# Patient Record
Sex: Male | Born: 1980
Health system: Southern US, Community
[De-identification: ages and names within clinical notes are randomized; demographics above are authoritative.]

## PROBLEM LIST (undated history)

## (undated) ENCOUNTER — Ambulatory Visit (HOSPITAL_COMMUNITY): Admission: EM | Payer: Self-pay

## (undated) DIAGNOSIS — F32A Depression, unspecified: Secondary | ICD-10-CM

## (undated) DIAGNOSIS — E785 Hyperlipidemia, unspecified: Secondary | ICD-10-CM

## (undated) DIAGNOSIS — N529 Male erectile dysfunction, unspecified: Secondary | ICD-10-CM

## (undated) DIAGNOSIS — Q681 Congenital deformity of finger(s) and hand: Secondary | ICD-10-CM

## (undated) DIAGNOSIS — F419 Anxiety disorder, unspecified: Secondary | ICD-10-CM

## (undated) DIAGNOSIS — G4700 Insomnia, unspecified: Secondary | ICD-10-CM

## (undated) DIAGNOSIS — F329 Major depressive disorder, single episode, unspecified: Secondary | ICD-10-CM

## (undated) DIAGNOSIS — Z0000 Encounter for general adult medical examination without abnormal findings: Secondary | ICD-10-CM

## (undated) DIAGNOSIS — R454 Irritability and anger: Secondary | ICD-10-CM

## (undated) DIAGNOSIS — F909 Attention-deficit hyperactivity disorder, unspecified type: Secondary | ICD-10-CM

## (undated) DIAGNOSIS — Z720 Tobacco use: Secondary | ICD-10-CM

## (undated) DIAGNOSIS — F988 Other specified behavioral and emotional disorders with onset usually occurring in childhood and adolescence: Secondary | ICD-10-CM

## (undated) DIAGNOSIS — F1011 Alcohol abuse, in remission: Secondary | ICD-10-CM

## (undated) DIAGNOSIS — J342 Deviated nasal septum: Secondary | ICD-10-CM

## (undated) HISTORY — DX: Anxiety disorder, unspecified: F41.9

## (undated) HISTORY — DX: Congenital deformity of finger(s) and hand: Q68.1

## (undated) HISTORY — PX: OTHER SURGICAL HISTORY: SHX169

## (undated) HISTORY — DX: Irritability and anger: R45.4

## (undated) HISTORY — DX: Male erectile dysfunction, unspecified: N52.9

## (undated) HISTORY — DX: Attention-deficit hyperactivity disorder, unspecified type: F90.9

## (undated) HISTORY — DX: Tobacco use: Z72.0

## (undated) HISTORY — DX: Alcohol abuse, in remission: F10.11

## (undated) HISTORY — DX: Deviated nasal septum: J34.2

## (undated) HISTORY — DX: Major depressive disorder, single episode, unspecified: F32.9

## (undated) HISTORY — PX: NASAL SEPTUM SURGERY: SHX37

## (undated) HISTORY — DX: Depression, unspecified: F32.A

## (undated) HISTORY — DX: Insomnia, unspecified: G47.00

## (undated) HISTORY — DX: Hyperlipidemia, unspecified: E78.5

## (undated) HISTORY — DX: Other specified behavioral and emotional disorders with onset usually occurring in childhood and adolescence: F98.8

## (undated) HISTORY — DX: Encounter for general adult medical examination without abnormal findings: Z00.00

---
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
## 2002-05-02 ENCOUNTER — Inpatient Hospital Stay (HOSPITAL_COMMUNITY): Admission: EM | Admit: 2002-05-02 | Discharge: 2002-05-06 | Payer: Self-pay

## 2002-05-02 ENCOUNTER — Encounter: Payer: Self-pay | Admitting: General Surgery

## 2002-05-02 ENCOUNTER — Encounter: Payer: Self-pay | Admitting: Emergency Medicine

## 2002-05-03 ENCOUNTER — Encounter: Payer: Self-pay | Admitting: General Surgery

## 2002-05-04 ENCOUNTER — Encounter: Payer: Self-pay | Admitting: General Surgery

## 2002-05-05 ENCOUNTER — Encounter: Payer: Self-pay | Admitting: General Surgery

## 2002-05-06 ENCOUNTER — Encounter: Payer: Self-pay | Admitting: General Surgery

## 2003-12-22 ENCOUNTER — Inpatient Hospital Stay (HOSPITAL_COMMUNITY): Admission: EM | Admit: 2003-12-22 | Discharge: 2003-12-23 | Payer: Self-pay | Admitting: Emergency Medicine

## 2003-12-24 ENCOUNTER — Emergency Department (HOSPITAL_COMMUNITY): Admission: EM | Admit: 2003-12-24 | Discharge: 2003-12-24 | Payer: Self-pay | Admitting: Emergency Medicine

## 2004-02-09 ENCOUNTER — Inpatient Hospital Stay (HOSPITAL_COMMUNITY): Admission: EM | Admit: 2004-02-09 | Discharge: 2004-02-11 | Payer: Self-pay | Admitting: Psychiatry

## 2004-07-20 ENCOUNTER — Ambulatory Visit (HOSPITAL_COMMUNITY): Payer: Self-pay | Admitting: Psychiatry

## 2005-11-02 ENCOUNTER — Emergency Department (HOSPITAL_COMMUNITY): Admission: EM | Admit: 2005-11-02 | Discharge: 2005-11-02 | Payer: Self-pay | Admitting: Emergency Medicine

## 2006-10-27 ENCOUNTER — Emergency Department (HOSPITAL_COMMUNITY): Admission: EM | Admit: 2006-10-27 | Discharge: 2006-10-27 | Payer: Self-pay | Admitting: Emergency Medicine

## 2008-12-25 ENCOUNTER — Emergency Department (HOSPITAL_COMMUNITY): Admission: EM | Admit: 2008-12-25 | Discharge: 2008-12-25 | Payer: Self-pay | Admitting: Emergency Medicine

## 2011-02-13 LAB — DIFFERENTIAL
Basophils Absolute: 0.1 10*3/uL (ref 0.0–0.1)
Basophils Relative: 1 % (ref 0–1)
Eosinophils Absolute: 0.2 10*3/uL (ref 0.0–0.7)
Eosinophils Relative: 3 % (ref 0–5)
Monocytes Absolute: 0.5 10*3/uL (ref 0.1–1.0)
Monocytes Relative: 6 % (ref 3–12)
Neutro Abs: 5.1 10*3/uL (ref 1.7–7.7)

## 2011-02-13 LAB — CBC
HCT: 47.3 % (ref 39.0–52.0)
Hemoglobin: 16.4 g/dL (ref 13.0–17.0)
MCHC: 34.7 g/dL (ref 30.0–36.0)
MCV: 88.9 fL (ref 78.0–100.0)
RBC: 5.32 MIL/uL (ref 4.22–5.81)
RDW: 13.8 % (ref 11.5–15.5)

## 2011-02-13 LAB — BASIC METABOLIC PANEL
CO2: 30 mEq/L (ref 19–32)
Calcium: 9.8 mg/dL (ref 8.4–10.5)
Chloride: 103 mEq/L (ref 96–112)
Glucose, Bld: 76 mg/dL (ref 70–99)
Sodium: 139 mEq/L (ref 135–145)

## 2011-02-13 LAB — RAPID URINE DRUG SCREEN, HOSP PERFORMED: Tetrahydrocannabinol: NOT DETECTED

## 2011-02-13 LAB — TRICYCLICS SCREEN, URINE: TCA Scrn: NOT DETECTED

## 2011-03-16 NOTE — H&P (Signed)
Jesse Jones, Jesse Jones NO.:  [id number]   MEDICAL RECORD NO.:  [id number]                   PATIENT TYPE:  EMS   LOCATION:  ED                                   FACILITY:  [facility]   PHYSICIAN:  Deirdre Peer. Polite, M.D.              DATE OF BIRTH:  Mar 12, 1981   DATE OF ADMISSION:  12/22/2003  DATE OF DISCHARGE:                                HISTORY & PHYSICAL   HISTORY OF PRESENT ILLNESS:  This 30 year old male with known history of  depression, anxiety, who presented to the ED via EMS with suicide gesture.  The patient was found at home obviously intoxicated by his mother.  When he  questioned, he stated that he wanted to end it all.  The patient stated he  was just fed up.   The patient stated he ingested Clonazepam, approximately eight tablets,  Bupropion 150 mg approximately three tablets, and about a half of a fifth of  alcohol.   In the emergency department, the patient was alert and oriented but  obviously intoxicated.  He has been treated with charcoal for poison control  and has been on the recommended ICU monitoring for the adverse effects of  the illicit drugs.   PAST MEDICAL HISTORY:  1. Significant for depression/anxiety.  2. ADHD.   MEDICATIONS:  1. Clonazepam 0.5 mg p.o. q.d.  2. Bupropion 150 mg 2 tablets q.d.  3. Adderall p.r.n.   SOCIAL HISTORY:  Smokes tobacco at half a pack per day.  Positive for  alcohol and positive for marijuana.  Occasional mushroom ingestion.   PAST SURGICAL HISTORY:  Significant for chest tube secondary to  pneumothorax.   ALLERGIES:  The patient describes an allergy to PENICILLIN.   FAMILY HISTORY:  Mother states that there is a family history of  bipolar/depression.   PHYSICAL EXAMINATION:  GENERAL:  The patient is alert and oriented x 3;  however, he is somewhat intoxicated.  VITAL SIGNS:  Temperature 97.6, blood pressure 108/67, pulse 132,  respiratory rate of 18.  HEENT:  Within normal  limits.  CHEST:  Clear.  CARDIOVASCULAR:  Regular.  ABDOMEN:  Nontender.  EXTREMITIES:  No edema.  There is no obvious swelling or deformity.  The  patient has 2+ pulses bilaterally.   LABORATORY DATA:  CBC was within normal limits.  CMET within normal limits.  Alcohol level was 231.  Urine drug screen is pending.  Salicylate and  acetaminophen level pending.   ASSESSMENT:  1. Suicide attempt with Clonazepam with eight tablets, Bupropion     approximately three tablets, and a fifth of bourbon.  2. Depression/anxiety.  3. Attention deficit hyperactive disorder.   RECOMMENDATIONS:  The patient is to be treated as recommended by poison  control to ICU monitoring and charcoal.  The patient will also require  psychiatric consultation in the morning.  Deirdre Peer. Polite, M.D.    RDP/MEDQ  D:  12/23/2003  T:  12/23/2003  Job:  [id number]

## 2011-03-16 NOTE — Discharge Summary (Signed)
[name], [name]:  [id number]   MEDICAL RECORD NO.:  [id number]                   PATIENT TYPE:  INP   LOCATION:  0382                                 FACILITY:  [facility]   PHYSICIAN:  Sherin Quarry, MD                   DATE OF BIRTH:  [birth year]   DATE OF ADMISSION:  12/22/2003  DATE OF DISCHARGE:  12/23/2003                                 DISCHARGE SUMMARY   Jesse Jones is a 30 year old man with a past history of depression who  presented to the emergency room on December 22, 2003 with a history of  ingestion of a large amount of alcohol as well as Klonopin, 8 tablets, and  bupropion 150 mg, 3 tablets.  On presentation to the emergency room, the  patient was alert but intoxicated.  Poison control was contacted and they  recommended observing the patient in the hospital overnight in light of his  drug abuse ingestions.   PHYSICAL EXAMINATION:  At the time of admission, as described by Dr. Nehemiah Settle:  The patient was alert and oriented, although very intoxicated.  Blood  pressure 108/67.  Pulse 130.  Respirations 18.  Temperature 97.6.  HEENT:  Within normal limits.  CHEST:  Clear.  CARDIOVASCULAR:  Normal S1 and S2.  There are no rubs, murmurs, or gallops.  ABDOMEN:  Benign.  NEUROLOGIC TESTING:  Examination of extremities was normal.   LABORATORY DATA:  Relevant laboratory studies obtained included:  CBC, which  revealed a white count of 8500.  Hemoglobin 16.  CMET was normal.  Acetaminophen and salicylate levels were negative.   On admission, the patient was placed on IV of D5 and 1/2 normal saline at  100 cc/hr.  He was observed on telemetry.  Subsequently, she was seen in  consultation by Dr. Jeanie Sewer of the psychiatry service.  Dr. Jeanie Sewer felt  that the patient was not at risk to harm himself.  He felt that the patient  had generalized anxiety and depression.  He felt that the patient has an  effective support group in his parents,  and that it was reasonable to  discharge him.  Therefore, the patient was discharged.   DISCHARGE DIAGNOSES:  1. Suicide attempt.  2. Depression.   On discharge, the patient was given Klonopin 0.5 mg with instructions to  take 1 b.i.d. as needed per instruction of Dr. Jeanie Sewer.  He was advised to  call emergency services as needed and was given a phone number to do this.   CONDITION ON DISCHARGE:  Good.                                               Sherin Quarry, MD    SY/MEDQ  D:  01/17/2004  T:  01/19/2004  Job:  [id number]

## 2011-03-16 NOTE — Discharge Summary (Signed)
Jesse Jones, Jesse Jones NO.:  [id number]   MEDICAL RECORD NO.:  [id number]                   PATIENT TYPE:  IPS   LOCATION:  0301                                 FACILITY:  [facility]   PHYSICIAN:  Geoffery Lyons, M.D.                   DATE OF BIRTH:  12/30/1980   DATE OF ADMISSION:  02/09/2004  DATE OF DISCHARGE:  02/11/2004                                 DISCHARGE SUMMARY   CHIEF COMPLAINT AND PRESENT ILLNESS:  This was the first admission to [facility] for this 30 year old white male voluntarily admitted.  Requesting help getting off alcohol due to problem with binge-drinking.  Scared himself this past weekend when he got extremely drunk, went rock-  climbing and fell down in the face of a rock, required helicopter rescue and  put in jail for public drunkenness.  Endorsed surges of anxiety, feeling  overwhelmed with what was going on.   PAST PSYCHIATRIC HISTORY:  Dr. Ladona Ridgel as an outpatient.  Taking Klonopin for  anxiety.  ADHD.  Had used stimulants before.   ALCOHOL/DRUG HISTORY:  As already stated, ongoing use of alcohol, binge-  drinking to a point of losing control.  Has used mushrooms, occasional  marijuana use, although was heavy in the past.   PAST MEDICAL HISTORY:  Multiple abrasions and contusions.  Healing well.  Congenital absence of left hand.   MEDICATIONS:  Klonopin 0.5 mg, 1/2 tab 1-2 times a day, was taking 4-5.  Adderall for ADHD.   PHYSICAL EXAMINATION:  Performed and failed to show any acute findings other  than the abrasions and the contusions.   MENTAL STATUS EXAM:  Fully alert, pleasant, cooperative male.  Remorseful  and embarrassed about the incident.  Speech normal rate, production and  tempo.  Mood anxious.  Affect anxious.  Wanting to abstain and do anything  he needed to do to abstain.  Thought processes logical, coherent and  relevant.  No delusions.  No evidence of hallucinations.  Cognition well-  preserved.   ADMISSION DIAGNOSES:   AXIS I:  1. Alcohol abuse.  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   AXIS II:  No diagnosis.   AXIS III:  1. Multiple abrasions and contusions.  2. Congenital absence of left hand.   AXIS IV:  Moderate.   AXIS V:  Global Assessment of Functioning upon admission 35; highest Global  Assessment of Functioning in the last year 70.   LABORATORY DATA:  CBC within normal limits.  Blood chemistry within normal  limits.  Thyroid profile within normal limits.   HOSPITAL COURSE:  He was admitted and started intensive individual and group  psychotherapy.  He was detoxified with Librium.  He was given trazodone for  sleep.  He did admit that the alcohol was out of control.  He was also using  the Klonopin and was also taking it  while drinking.  Endorsed mood swings  with irritability, anger, loss of control.  Endorsed having a hard time  getting himself together, being adrenaline junkie, very poor judgment.  Endorsed blackouts.  Detoxification went uneventfully.  He was endorsing no  suicidal ideation, no homicidal ideation.  He was wanting to pursue further  outpatient treatment.  As he was not evidencing any withdrawal, we went  ahead and discharged to outpatient follow-up.   DISCHARGE DIAGNOSES:   AXIS I:  1. Alcohol abuse.  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   AXIS II:  No diagnosis.   AXIS III:  1. Congenital absence of left hand.  2. Multiple abrasions and healing.  3. Status post body trauma.   AXIS IV:  Moderate.   AXIS V:  Global Assessment of Functioning upon discharge 55-60.   DISCHARGE MEDICATIONS:  Librium taper 25 mg three times a day for a day;  then twice a day for a day and then 1 daily for a day.   FOLLOW UP:  [facility] and Dr. Ladona Ridgel.                                               Geoffery Lyons, M.D.    IL/MEDQ  D:  03/08/2004  T:  03/10/2004  Job:  [id number]

## 2011-03-16 NOTE — Discharge Summary (Signed)
[place]. [facility]  Patient:    Jesse Jones, Jesse Jones Visit Number: [id number] MRN: [id number]          Service Type: MED Location: [phone] Attending Physician:  Trauma, Md Dictated by:   Shawn Rayburn, P.A. Admit Date:  05/02/2002 Discharge Date: 05/06/2002   CC:         Adolph Pollack, M.D.   Discharge Summary  DISCHARGE DIAGNOSES: 1. Status post blunt flank and chest trauma. 2. Left pneumothorax. 3. History of anxiety disorder.  PROCEDURES: Left chest tube placed per Dr. Abbey Chatters on May 02, 2002.  HISTORY OF PRESENT ILLNESS: This is a 30 year old white male who fell against a log, striking his left flank and lower chest area against the log. He presented with progressive increase in chest pain and mild dyspnea. He is a cigarette smoker and has a history of Clonopin use for anxiety. He was hemodynamically stable. His blood pressure was 99/58 on presentation and heart rate was 96. Sats on room air were 96%. Workup at this time revealed a left pneumothorax. The patient underwent placement of a #28 French left chest tube per Dr. Abbey Chatters and this was placed on suction. The patient was admitted for further care. He also underwent CT scan of the abdomen and pelvic to rule out splenic injury and this was negative. Thoracic spine film x-rays were also negative for fracture. The patient continued to show good oxygen saturations but had poor pain control initially and Toradol was added to his Morphine PCA regimen. His follow-up chest x-ray showed tiny, less than 5%, residual pneumothorax and the patients chest tube was placed on water seal on May 04, 2002. Again, follow-up chest x-ray showed a very tiny, less than 5%, pneumothorax on the left on May 05, 2002 and the chest tube was removed without difficulty. Follow-up chest x-ray on May 06, 2002 revealed again, tiny less than 5% apical pneumothorax on the left. The patient was medically stable and  deemed ready for discharge at this time.  DISCHARGE MEDICATIONS: 1. Tylox one to two p.o. q.4-6h. p.r.n. pain, #40 with no refill. 2. Tylenol or Ibuprofen as needed for milder pain.  WOUND CARE: He was allowed to remove his left chest dressing on May 07, 2002 and to begin showering.  ACTIVITY: As tolerated. No working or driving until cleared by Trauma Service follow-up.  FOLLOW-UP: With Trauma Service on May 12, 2002 at 9:00 a.m. Dictated by:   Shawn Rayburn, P.A. Attending Physician:  Trauma, Md DD:  05/13/02 TD:  05/17/02 Job: [id number]

## 2011-03-28 ENCOUNTER — Ambulatory Visit: Payer: Self-pay | Admitting: Family Medicine

## 2011-03-29 ENCOUNTER — Ambulatory Visit: Payer: Self-pay | Admitting: Family Medicine

## 2011-04-02 ENCOUNTER — Encounter: Payer: Self-pay | Admitting: Family Medicine

## 2011-04-02 ENCOUNTER — Ambulatory Visit (INDEPENDENT_AMBULATORY_CARE_PROVIDER_SITE_OTHER): Payer: BC Managed Care – PPO | Admitting: Family Medicine

## 2011-04-02 DIAGNOSIS — Q740 Other congenital malformations of upper limb(s), including shoulder girdle: Secondary | ICD-10-CM

## 2011-04-02 DIAGNOSIS — F341 Dysthymic disorder: Secondary | ICD-10-CM

## 2011-04-02 DIAGNOSIS — F909 Attention-deficit hyperactivity disorder, unspecified type: Secondary | ICD-10-CM

## 2011-04-02 DIAGNOSIS — F32A Depression, unspecified: Secondary | ICD-10-CM

## 2011-04-02 DIAGNOSIS — Q681 Congenital deformity of finger(s) and hand: Secondary | ICD-10-CM

## 2011-04-02 DIAGNOSIS — F419 Anxiety disorder, unspecified: Secondary | ICD-10-CM

## 2011-04-02 DIAGNOSIS — F329 Major depressive disorder, single episode, unspecified: Secondary | ICD-10-CM

## 2011-04-02 HISTORY — DX: Attention-deficit hyperactivity disorder, unspecified type: F90.9

## 2011-04-02 HISTORY — DX: Congenital deformity of finger(s) and hand: Q68.1

## 2011-04-02 HISTORY — DX: Depression, unspecified: F32.A

## 2011-04-02 HISTORY — DX: Anxiety disorder, unspecified: F41.9

## 2011-04-02 MED ORDER — AMPHETAMINE-DEXTROAMPHETAMINE 30 MG PO TABS: 30.0000 mg | ORAL_TABLET | Freq: Two times a day (BID) | ORAL | Status: DC

## 2011-04-02 MED ORDER — CLONAZEPAM 1 MG PO TABS: 1.0000 mg | ORAL_TABLET | Freq: Three times a day (TID) | ORAL | Status: DC | PRN

## 2011-04-02 NOTE — Assessment & Plan Note (Signed)
Patient well compensated.

## 2011-04-02 NOTE — Assessment & Plan Note (Signed)
Patient with long history of ADHD has tolerated Adderall  And does best on the 30mg  dose twice a day, is given a refill today

## 2011-04-02 NOTE — Patient Instructions (Signed)
Attention Deficit-Hyperactivity Disorder ADHD Attention deficit-hyperactivity disorder (ADHD) is a problem with behavior issues based on the way the brain functions (neurobehavioral disorder). It is a common reason for behavior and academic problems in school. CAUSES The cause of ADHD is unknown in most cases. It may run in families. It sometimes can be associated with learning disabilities and other behavioral problems. SYMPTOMS There are three types of ADHD. Some of the symptoms include:  Inattentive   Gets bored or distracted easily   Loses or forgets things. Forgets to hand in homework.   Has trouble organizing or completing tasks.   Difficulty staying on task.   An inability to organize daily tasks and school work.   Leaving projects, chores and homework unfinished.   Trouble paying attention or responding to details. Careless mistakes.   Difficulty following directions. Often seems like is not listening.   Dislikes activities that require sustained attention (like chores or homework).   Hyperactive-impulsive   Feels like it is impossible to sit still or stay in a seat. Fidgeting with hands and feet.   Trouble waiting turn.   Talking too much or out of turn. Interruptive.   Speaks or acts impulsively   Aggressive, disruptive behavior   Constantly busy or on the go, noisy.   Combined   Has symptoms of both of the above.  Often children with ADHD feel discouraged about themselves and with school. They often perform well below their abilities in school. These symptoms can cause problems in home, school, and in relationships with peers. As children get older, the excess motor activities can calm down, but the problems with paying attention and staying organized persist. Most children do not outgrow ADHD but with good treatment can learn to cope with the symptoms. DIAGNOSIS When ADHD is suspected, the diagnosis should be made by professionals trained in ADHD.    Diagnosis will include:  Ruling out other reasons for the child's behavior.   The caregivers will check with the child's school and check their medical records.   They will talk to teachers and parents.   Behavior rating scales for the child will be filled out by those dealing with the child on a daily basis.  A diagnosis is made only after all information has been considered. TREATMENT Treatment usually includes behavioral treatment often along with medicines. It may include stimulant medicines. The stimulant medicines decrease impulsivity and hyperactivity and increase attention. Other medicines used include antidepressants and certain blood pressure medicines. Most experts agree that treatment for ADHD should address all aspects of the child's functioning. Treatment should not be limited to the use of medicines alone. Treatment should include structured classroom management. The parents must receive education to address rewarding good behavior, discipline and limit-setting. Tutoring and/or behavioral therapy should be available for the child. If untreated, the disorder can have long term serious effects into adolescence and adulthood. HOMECARE INSTRUCTIONS   Often with ADHD there is a lot of frustration among the family in dealing with the illness. There is often blame and anger that is not warranted. This is a life long illness. There is no way to prevent ADHD. In many cases, because the problem affects the family as a whole, the entire family may need help. A therapist can help the family find better ways to handle the disruptive behaviors and promote change. If the child is young, most of the therapist's work is with the parents. Parents will learn techniques for coping with and improving their child's behavior.   Sometimes only the child with the ADHD needs counseling. Your caregivers can help you make these decisions.   Children with ADHD may need help in organizing. Here are some helpful  tips:   Keep routines the same every day from wake-up time to bedtime. Schedule everything. This includes homework and playtime. This should include outdoor and indoor recreation. Keep the schedule on the refrigerator or a bulletin board where it is frequently seen. Mark schedule changes as far in advance as possible.   Have a place for everything and keep everything in its place. This includes clothing, backpacks, and school supplies.   Encourage writing down assignments and bringing home needed books.   Offer your child a well-balanced diet. Breakfast is especially important for school performance. Children should avoid drinks with caffeine including:   Soft drinks.   Coffee.   Tea.   However, some older children (adolescents) may find these drinks helpful in improving their attention.   Children with ADHD need consistent rules that they can understand and follow. If rules are followed, give small rewards. Children with ADHD often receive, and expect, criticism. Look for good behavior and praise it. Set realistic goals. Give clear instructions. Look for activities that can foster success and self-esteem. Make time for pleasant activities with your child. Give lots of affection.   Parents are their children's greatest advocates. Learn as much as possible about ADHD. This helps you become a stronger and better advocate for your child. It also helps you educate your child's teachers and instructors if they feel inadequate in these areas. Parent support groups are often helpful. A national group with local chapters is called CHADD (Children and Adults with Attention Deficit/Hyperactivity Disorder).  PROGNOSIS  There is no cure for ADHD. Children with the disorder seldom outgrow it. Many find adaptive ways to accommodate the ADHD as they mature. SEEK MEDICAL CARE IF YOUR CHILD HAS:  Repeated muscle twitches, cough or speech outbursts.   Sleep problems.   Marked loss of appetite.    Depression.   New or worsening behavioral problems.   Dizziness.   Racing heart.   Stomach pains.   Headaches.  Document Released: 10/05/2002 Document Re-Released: 07/24/2008 [employer] Patient Information 2011 [employer]. 

## 2011-04-02 NOTE — Progress Notes (Signed)
Jesse Jones [id number] [birth year] 04/02/2011      Progress Note New Patient  Subjective   Chief Complaint  Patient presents with  . Establish Care    new patient    HPI  Patient is a 30 yo [place] male in today to establish care. He has several family members who already are seen in our office and would like to start receiving his care here. But has been ports doing relatively well at this time.  He reports dating back to childhood having trouble with in school and social situations. He says he has had high levels of anxiety the gets paranoid about going out and doing anything. He reports as a child they tried him on multiple medications and he had a bad reaction to many. He reports having a bad reaction to Haldol which resulted in severe muscle spasm in his neck and ultimately need for slow with Benadryl before resolving. Multiple ADHD meds were tried over the years he had bad reactions to many. At present he is doing well on Adderall twice a day. As he does the best when he is on 30 mg twice a day. He andhis family both note he is happier more functional and easier to live with when he takes it twice a day . He's had severe depression in the past but feels that is stable at the present time. He previosly was maintained on Celexa in the past but he weaned himself off do to he reports he was on it for 4 years and he never felt it made a difference. N9o bad side effects but he does not feel like it helped anything. He does not feel he needs any other new meds at this time. No recent illness, fevers, HA, congestion, CP, palp, SOB, GI or GU c/o.  Past Medical History  Diagnosis Date  . Anxiety   . Depression   . ADD (attention deficit disorder)   . Insomnia   . Outbursts of anger   . Concussion     X 6- 7  . ADHD (attention deficit hyperactivity disorder) 04/02/2011  . 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  Punctured lung   . Toe surgeries during childhood     for ingrown toenails    Family History  Problem Relation Age of Onset  . Depression Mother   . Anxiety disorder Mother   . Depression Brother   . Cancer Paternal Grandmother     lung/ didn't smoke      History    Social History  . Marital Status: Single    Spouse Name: N/A    Number of Children: N/A  . Years of Education: N/A   Occupational History  . Not on file.   Social History Main Topics  . Smoking status: Current Everyday Smoker -- 0.3 packs/day    Types: Cigarettes  . Smokeless tobacco: Not on file  . Alcohol Use: No  . Drug Use: No  . Sexually Active: Yes -- Male partner(s)   Other Topics Concern  . Not on file   Social History Narrative  . No narrative on file    No current outpatient prescriptions on file prior to visit.    Allergies  Allergen Reactions  . Haldol Decanoate   . Penicillins Hives    Review of Systems  Review of Systems  Constitutional: Negative for fever, chills and malaise/fatigue.  HENT: Negative for  hearing loss, nosebleeds and congestion.   Eyes: Negative for pain and discharge.  Respiratory: Negative for cough, sputum production, shortness of breath and wheezing.   Cardiovascular: Negative for chest pain, palpitations and leg swelling.  Gastrointestinal: Negative for heartburn, nausea, vomiting, abdominal pain, diarrhea, constipation and blood in stool.  Genitourinary: Negative for dysuria, urgency, frequency and hematuria.  Musculoskeletal: Negative for myalgias, back pain and falls.  Skin: Negative for rash.  Neurological: Negative for dizziness, tremors, sensory change, focal weakness, loss of consciousness, weakness and headaches.  Endo/Heme/Allergies: Negative for polydipsia. Does not bruise/bleed easily.  Psychiatric/Behavioral: Positive for depression and hallucinations. Negative for suicidal ideas and substance abuse. The patient is nervous/anxious. The patient  does not have insomnia.        [Only notes auditory hallucinations as a child when he was on some unknown medication. No difficulties as an adult. Patient describes long history of severe depression and anxiety even resulting in paranoia at times. It has caused him some dysfunction in keeping a job and he presently lives at home with his parents. He denies having a problem with alcoholism but acknowledges alcohol gets away from him and he chose to quit drinking because he has trouble managing his anger when he drinks.   Objective  BP 108/73  Pulse 87  Temp(Src) 98.7 F (37.1 C) (Oral)  Ht 6\' 2"  (1.88 m)  Wt 156 lb 12.8 oz (71.124 kg)  BMI 20.13 kg/m2  SpO2 97%  Physical Exam  Physical Exam  Constitutional: He is oriented to person, place, and time and well-developed, well-nourished, and in no distress. No distress.  HENT:  Head: Normocephalic and atraumatic.  Right Ear: External ear normal.  Left Ear: External ear normal.  Nose: Nose normal.  Mouth/Throat: Oropharynx is clear and moist. No oropharyngeal exudate.  Eyes: Conjunctivae and EOM are normal. Right eye exhibits no discharge. Left eye exhibits no discharge. No scleral icterus.  Neck: Normal range of motion. Neck supple. No thyromegaly present.  Cardiovascular: Normal rate, regular rhythm and normal heart sounds.   No murmur heard. Pulmonary/Chest: Effort normal and breath sounds normal. No respiratory distress.  Abdominal: He exhibits no distension and no mass. There is no tenderness.  Musculoskeletal: He exhibits no edema.  Neurological: He is alert and oriented to person, place, and time. He displays abnormal reflex. No cranial nerve deficit. He exhibits normal muscle tone. Gait normal. Coordination normal.  Skin: Skin is warm and dry. No rash noted. He is not diaphoretic.  Psychiatric: Mood, memory, affect and judgment normal.       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  Anxiety and depression Patient reports he has been on Klonopin for roughly 12 years with good results, he had been on Celexa for many years but recently weaned himself off due to intolerable ED, he does not feel his anxiety or depression have worsened since he came off the medications. We will continue his Klonopin at the tid dosing for now and he is encouraged to minimize his dosing each day. Reevaluate next month, requesting old records from previous PCP  ADHD (attention deficit hyperactivity disorder) Patient with long history of ADHD has tolerated Adderall  And does best on the 30mg  dose twice a day, is given a refill today

## 2011-04-02 NOTE — Assessment & Plan Note (Addendum)
Patient reports he has been on Klonopin for roughly 12 years with good results, he had been on Celexa for many years but recently weaned himself off due to intolerable ED, he does not feel his anxiety or depression have worsened since he came off the medications. We will continue his Klonopin at the tid dosing for now and he is encouraged to minimize his dosing each day. Reevaluate next month, requesting old records from previous PCP

## 2011-04-27 ENCOUNTER — Encounter: Payer: Self-pay | Admitting: Family Medicine

## 2011-04-27 ENCOUNTER — Ambulatory Visit (INDEPENDENT_AMBULATORY_CARE_PROVIDER_SITE_OTHER): Payer: BC Managed Care – PPO | Admitting: Family Medicine

## 2011-04-27 VITALS — BP 114/78 | HR 84 | Ht 74.0 in | Wt 151.0 lb

## 2011-04-27 DIAGNOSIS — F909 Attention-deficit hyperactivity disorder, unspecified type: Secondary | ICD-10-CM

## 2011-04-27 DIAGNOSIS — F411 Generalized anxiety disorder: Secondary | ICD-10-CM

## 2011-04-27 DIAGNOSIS — F32A Depression, unspecified: Secondary | ICD-10-CM

## 2011-04-27 DIAGNOSIS — Z720 Tobacco use: Secondary | ICD-10-CM

## 2011-04-27 DIAGNOSIS — Q681 Congenital deformity of finger(s) and hand: Secondary | ICD-10-CM

## 2011-04-27 DIAGNOSIS — F329 Major depressive disorder, single episode, unspecified: Secondary | ICD-10-CM

## 2011-04-27 DIAGNOSIS — F341 Dysthymic disorder: Secondary | ICD-10-CM

## 2011-04-27 DIAGNOSIS — F17200 Nicotine dependence, unspecified, uncomplicated: Secondary | ICD-10-CM

## 2011-04-27 DIAGNOSIS — F419 Anxiety disorder, unspecified: Secondary | ICD-10-CM

## 2011-04-27 DIAGNOSIS — Q740 Other congenital malformations of upper limb(s), including shoulder girdle: Secondary | ICD-10-CM

## 2011-04-27 MED ORDER — AMPHETAMINE-DEXTROAMPHETAMINE 30 MG PO TABS: 30.0000 mg | ORAL_TABLET | Freq: Every day | ORAL | Status: DC

## 2011-04-27 MED ORDER — CLONAZEPAM 1 MG PO TABS: 1.0000 mg | ORAL_TABLET | Freq: Three times a day (TID) | ORAL | Status: DC | PRN

## 2011-04-27 MED ORDER — AMPHETAMINE-DEXTROAMPHETAMINE 30 MG PO TABS: 30.0000 mg | ORAL_TABLET | Freq: Two times a day (BID) | ORAL | Status: DC

## 2011-04-27 NOTE — Patient Instructions (Signed)
Nicotine Addiction Nicotine can act as both a stimulant (excites/activates) and a sedative (calms/quiets). Immediately after exposure to nicotine, there is a "kick" caused in part by the drug's stimulation of the adrenal glands and resulting discharge of adrenaline (epinephrine). The rush of adrenaline stimulates the body and causes a sudden release of sugar. This means that smokers are always slightly hyperglycemic. Hyperglycemic means that the blood sugar is high, just like in diabetics. Nicotine also decreases the amount of insulin which helps control sugar levels in the body. There is an increase in blood pressure, breathing, and the rate of heart beats.  In addition, nicotine indirectly causes a release of dopamine in the brain that controls pleasure and motivation. A similar reaction is seen with other drugs of abuse, such as cocaine and heroin. This dopamine release is thought to cause the pleasurable sensations when smoking. In some different cases, nicotine can also create a calming effect, depending on sensitivity of the smoker's nervous system and the dose of nicotine taken. WHAT HAPPENS WHEN NICOTINE IS TAKEN FOR LONG PERIODS OF TIME?  Long-term use of nicotine results in addiction. It is difficult to stop.   Repeated use of nicotine creates tolerance. Higher doses of nicotine are needed to get the "kick."  When nicotine use is stopped, withdrawal may last a month or more. Withdrawal may begin within a few hours after the last cigarette. Symptoms peak within the first few days and may lessen within a few weeks. For some people, however, symptoms may last for months or longer. Withdrawal symptoms include:   Irritability.   Craving.   Learning and attention deficits.   Sleep disturbances.   Increased appetite.  Craving for tobacco may last for 6 months or longer. Many behaviors done while using nicotine can also play a part in the severity of withdrawal symptoms. For some people, the  feel, smell, and sight of a cigarette and the ritual of obtaining, handling, lighting, and smoking the cigarette are closely linked with the pleasure of smoking. When stopped, they also miss the related behaviors which make the withdrawal or craving worse. While nicotine gum and patches may lessen the drug aspects of withdrawal, cravings often persist. WHAT ARE THE MEDICAL CONSEQUENCES OF NICOTINE USE?  Nicotine addiction accounts for one-third of all cancers. The top cancer caused by tobacco is lung cancer. Lung cancer is the number one cancer killer of both men and women.   Smoking is also associated with cancers of the:   Mouth.  Pharynx.   Larynx.   Esophagus.  Stomach.   Pancreas.   Cervix.  Kidney.   Ureter.   Bladder.    Smoking also causes lung diseases such as lasting (chronic) bronchitis and emphysema.   It worsens asthma in adults and children.   Smoking increases the risk of heart disease, including:   Stroke.  Heart attack.  Vascular disease.  Aneurysm.   Passive or secondary smoke can also increase medical risks including:   Asthma in children.   Sudden Infant Death Syndrome (SIDS).   Additionally, dropped cigarettes are the leading cause of residential fire fatalities.   Nicotine poisoning has been reported from accidental ingestion of tobacco products by children and pets. Death usually results in a few minutes from respiratory failure (when a person stops breathing) caused by paralysis.  TREATMENT FOR NICOTINE ADDICTION  Medication. Nicotine replacement medicines such as nicotine gum and the patch are used to stop smoking. These medicines gradually lower the dosage of nicotine in   the body. These medicines do not contain the carbon monoxide and other toxins found in tobacco smoke.   Hypnotherapy.   Relaxation therapy.   Nicotine Anonymous (a 12-step support program). Find times and locations in your local yellow pages.  Document Released:  06/20/2004 Document Re-Released: 11/06/2009 [employer] Patient Information 2011 [employer]. 

## 2011-04-28 ENCOUNTER — Encounter: Payer: Self-pay | Admitting: Family Medicine

## 2011-04-28 DIAGNOSIS — F17200 Nicotine dependence, unspecified, uncomplicated: Secondary | ICD-10-CM | POA: Insufficient documentation

## 2011-04-28 DIAGNOSIS — Z720 Tobacco use: Secondary | ICD-10-CM

## 2011-04-28 HISTORY — DX: Tobacco use: Z72.0

## 2011-04-28 NOTE — Assessment & Plan Note (Signed)
Patient stopped citalopram due to SE of ED but has done well with clonazepam use, discussed long term need for a baseline stabilization. Patient will consider and we will discuss again at next visit. Rx for rf of Clonazepam given today.

## 2011-04-28 NOTE — Assessment & Plan Note (Signed)
Is down to 4-5 cigarettes daily and is counseled for greater than 3 minutes regarding the need to quit completley to prevent many potential illnesses in the future. He expresses understanding

## 2011-04-28 NOTE — Progress Notes (Signed)
Jesse Jones [id number] [birth year] 04/28/2011      Progress Note-Follow Up  Subjective  Chief Complaint  Chief Complaint  Patient presents with  . ADHD    refill meds    HPI  Patient is a 30 year old Caucasian male who is in today for reevaluation. Citalopram to side effect for him. He had no difficulties. No GI or GU complaints no chest pain, palpitations, shortness of breath. Has had a recent febrile illness. He does believe the clonazepam does help his baseline anxiety. He denies suicidal or homicidal ideation and overall feels he is doing adequately. He is presently pursuing disability secondary to his congenital difficulties with his hands as well as his ADHD and difficulty me painting a job. He does work at 30 mg twice a day as helping his concentration to adequate. He does not notice any untoward side effects. No anorexia no headache noted.  Past Medical History  Diagnosis Date  . Anxiety   . Depression   . ADD (attention deficit disorder)   . Insomnia   . Outbursts of anger   . Concussion     X 6- 7  . ADHD (attention deficit hyperactivity disorder) 04/02/2011  . 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 Punctured lung   . Toe surgeries during childhood     for ingrown toenails    Family History  Problem Relation Age of Onset  . Depression Mother   . Anxiety disorder Mother   . Depression Brother   . Cancer Paternal Grandmother     lung/ didn't smoke    History   Social History  . Marital Status: Single    Spouse Name: N/A    Number of Children: N/A  . Years of Education: N/A   Occupational History  . Not on file.   Social History Main Topics  . Smoking status: Current Everyday Smoker -- 0.3 packs/day    Types: Cigarettes  . Smokeless tobacco: Never Used  . Alcohol Use: No  . Drug Use: No  . Sexually Active: Yes -- Male partner(s)   Other Topics Concern  . Not on  file   Social History Narrative  . No narrative on file    Current Outpatient Prescriptions on File Prior to Visit  Medication Sig Dispense Refill  . DISCONTD: clonazePAM (KLONOPIN) 1 MG tablet Take 1 tablet (1 mg total) by mouth 3 (three) times daily as needed. For anxiety and anger  90 tablet  0    Allergies  Allergen Reactions  . Haloperidol Decanoate   . Penicillins Hives    Review of Systems  Review of Systems  Constitutional: Negative for fever and malaise/fatigue.  HENT: Negative for congestion.   Eyes: Negative for discharge.  Respiratory: Negative for shortness of breath.   Cardiovascular: Negative for chest pain, palpitations and leg swelling.  Gastrointestinal: Negative for nausea, abdominal pain and diarrhea.  Genitourinary: Negative for dysuria.  Musculoskeletal: Negative for falls.  Skin: Negative for rash.  Neurological: Negative for loss of consciousness and headaches.  Endo/Heme/Allergies: Negative for polydipsia.  Psychiatric/Behavioral: Positive for depression. Negative for suicidal ideas. The patient is nervous/anxious. The patient does not have insomnia.     Objective  BP 114/78  Pulse 84  Ht 6\' 2"  (1.88 m)  Wt 151 lb (68.493 kg)  BMI 19.39 kg/m2  SpO2 98%  Physical Exam  Physical Exam  Constitutional: He is oriented to person,  place, and time and well-developed, well-nourished, and in no distress. No distress.  HENT:  Head: Normocephalic and atraumatic.  Eyes: Conjunctivae are normal.  Neck: Neck supple. No thyromegaly present.  Cardiovascular: Normal rate, regular rhythm and normal heart sounds.   No murmur heard. Pulmonary/Chest: Effort normal and breath sounds normal. No respiratory distress.  Abdominal: He exhibits no distension and no mass. There is no tenderness.  Musculoskeletal: He exhibits no edema.  Neurological: He is alert and oriented to person, place, and time.  Skin: Skin is warm.  Psychiatric: Memory, affect and judgment  normal.    No results found for this basename: TSH   Lab Results  Component Value Date   WBC 8.9 12/25/2008   HGB 16.4 12/25/2008   HCT 47.3 12/25/2008   MCV 88.9 12/25/2008   PLT 250 12/25/2008   Lab Results  Component Value Date   CREATININE 0.97 12/25/2008   BUN 8 12/25/2008   NA 139 12/25/2008   K 4.1 12/25/2008   CL 103 12/25/2008   CO2 30 12/25/2008     Assessment & Plan Anxiety and depression Patient stopped citalopram due to SE of ED but has done well with clonazepam use, discussed long term need for a baseline stabilization. Patient will consider and we will discuss again at next visit. Rx for rf of Clonazepam given today.  Congenital deformity of hand Is presently applying for disability for the second time does believe with his multiple disabilities he will always have trouble holding a job  Tobacco abuse Is down to 4-5 cigarettes daily and is counseled for greater than 3 minutes regarding the need to quit completley to prevent many potential illnesses in the future. He expresses understanding

## 2011-04-28 NOTE — Assessment & Plan Note (Signed)
Is presently applying for disability for the second time does believe with his multiple disabilities he will always have trouble holding a job

## 2011-05-17 ENCOUNTER — Other Ambulatory Visit: Payer: Self-pay | Admitting: Family Medicine

## 2011-05-17 DIAGNOSIS — F909 Attention-deficit hyperactivity disorder, unspecified type: Secondary | ICD-10-CM

## 2011-05-17 NOTE — Telephone Encounter (Signed)
Patient is requesting a refill on his medication 10 days early he is going out of town.

## 2011-05-18 NOTE — Telephone Encounter (Signed)
I have attempted to contact this patient by phone with the following results: left message to return my call on answering machine (mobile).  

## 2011-05-18 NOTE — Telephone Encounter (Signed)
So I also have a note from the pharmacy asking if they can fill this early[place]. Please talk with patient and make sure he understands that if we let him get this early just this one month. He cannot pick it up any earlier than it would normally be due next month. If he is in agreement, please document the date it is due per the pharmacy when you talk to them so if this becomes a pattern we can stop this easily. Then he can have perimission to fill this early once

## 2011-05-21 NOTE — Telephone Encounter (Signed)
2nd message left for pt. Advised pt if he needs refill early to return my call, otherwise to check with pharmacy when refill is due.  Ending follow up.

## 2011-05-29 ENCOUNTER — Telehealth: Payer: Self-pay | Admitting: Family Medicine

## 2011-05-29 NOTE — Telephone Encounter (Signed)
Patient only has one ClonazePAM left, would like refill sent to [facility]

## 2011-05-29 NOTE — Telephone Encounter (Signed)
Please advise 

## 2011-05-30 NOTE — Telephone Encounter (Signed)
OK to fill slightly early this one time

## 2011-05-30 NOTE — Telephone Encounter (Signed)
Pharmacist Jonny Ruiz) informed and pt aware that we will do it a few days early this one time.

## 2011-07-13 ENCOUNTER — Ambulatory Visit (INDEPENDENT_AMBULATORY_CARE_PROVIDER_SITE_OTHER): Payer: BC Managed Care – PPO | Admitting: Family Medicine

## 2011-07-13 ENCOUNTER — Encounter: Payer: Self-pay | Admitting: Family Medicine

## 2011-07-13 VITALS — BP 105/73 | HR 90 | Temp 97.8°F | Ht 74.0 in | Wt 151.0 lb

## 2011-07-13 DIAGNOSIS — F411 Generalized anxiety disorder: Secondary | ICD-10-CM

## 2011-07-13 DIAGNOSIS — Z720 Tobacco use: Secondary | ICD-10-CM

## 2011-07-13 DIAGNOSIS — F17200 Nicotine dependence, unspecified, uncomplicated: Secondary | ICD-10-CM

## 2011-07-13 DIAGNOSIS — F419 Anxiety disorder, unspecified: Secondary | ICD-10-CM

## 2011-07-13 DIAGNOSIS — F32A Depression, unspecified: Secondary | ICD-10-CM

## 2011-07-13 DIAGNOSIS — F909 Attention-deficit hyperactivity disorder, unspecified type: Secondary | ICD-10-CM

## 2011-07-13 DIAGNOSIS — F341 Dysthymic disorder: Secondary | ICD-10-CM

## 2011-07-13 DIAGNOSIS — F329 Major depressive disorder, single episode, unspecified: Secondary | ICD-10-CM

## 2011-07-13 DIAGNOSIS — Z23 Encounter for immunization: Secondary | ICD-10-CM

## 2011-07-13 MED ORDER — AMPHETAMINE-DEXTROAMPHETAMINE 30 MG PO TABS: 30.0000 mg | ORAL_TABLET | Freq: Two times a day (BID) | ORAL | Status: DC

## 2011-07-13 MED ORDER — CLONAZEPAM 1 MG PO TABS: 1.0000 mg | ORAL_TABLET | Freq: Three times a day (TID) | ORAL | Status: DC | PRN

## 2011-07-13 NOTE — Progress Notes (Signed)
Jesse Jones [id number] 1980/11/22 07/13/2011      Progress Note-Follow Up  Subjective  Chief Complaint  Chief Complaint  Patient presents with  . ADHD    HPI  Patient is a 30 yo Caucasian male in today for f/u on anxiety, ADHD and tobacco use. He has cut back to just a couple cigarettes a day and denies any SOB/cp/palp/fevers/congestion/GI or GU c/o. His anxiety and depression are doing well and he is much less anxious since his SSI has been approved. He denies any depression and says his mood has been good. His anxiety does not require 3 doses of Clonazepam every day. He has no sedation or SE on Clonazepam. His ADHD is responsive to Adderall bid and he offers no new concerns, agrees to take flu shot today  Past Medical History  Diagnosis Date  . Anxiety   . Depression   . ADD (attention deficit disorder)   . Insomnia   . Outbursts of anger   . Concussion     X 6- 7  . ADHD (attention deficit hyperactivity disorder) 04/02/2011  . 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 Tobacco abuse 04/28/2011    Past Surgical History  Procedure Date  . Punctured lung   . Toe surgeries during childhood     for ingrown toenails    Family History  Problem Relation Age of Onset  . Depression Mother   . Anxiety disorder Mother   . Depression Brother   . Cancer Paternal Grandmother     lung/ didn't smoke    History   Social History  . Marital Status: Single    Spouse Name: N/A    Number of Children: N/A  . Years of Education: N/A   Occupational History  . Not on file.   Social History Main Topics  . Smoking status: Current Everyday Smoker -- 0.3 packs/day    Types: Cigarettes  . Smokeless tobacco: Never Used  . Alcohol Use: No  . Drug Use: No  . Sexually Active: Yes -- Male partner(s)   Other Topics Concern  . Not on file   Social History Narrative  . No narrative on file    Current Outpatient Prescriptions on File Prior to  Visit  Medication Sig Dispense Refill  . Multiple Vitamin (MULTIVITAMIN) tablet Take 1 tablet by mouth daily.        Marland Kitchen VITAMIN D, CHOLECALCIFEROL, PO Take 1 capsule by mouth daily.          Allergies  Allergen Reactions  . Haloperidol Decanoate   . Penicillins Hives  . Valium     Review of Systems  Review of Systems  Constitutional: Negative for fever and malaise/fatigue.  HENT: Negative for congestion.   Eyes: Negative for discharge.  Respiratory: Negative for shortness of breath.   Cardiovascular: Negative for chest pain, palpitations and leg swelling.  Gastrointestinal: Negative for nausea, abdominal pain and diarrhea.  Genitourinary: Negative for dysuria.  Musculoskeletal: Negative for falls.  Skin: Negative for rash.  Neurological: Negative for loss of consciousness and headaches.  Endo/Heme/Allergies: Negative for polydipsia.  Psychiatric/Behavioral: Negative for depression and suicidal ideas. The patient is nervous/anxious. The patient does not have insomnia.     Objective  BP 105/73  Pulse 90  Temp(Src) 97.8 F (36.6 C) (Oral)  Ht 6\' 2"  (1.88 m)  Wt 151 lb (68.493 kg)  BMI 19.39 kg/m2  SpO2 100%  Physical Exam  Physical Exam  Constitutional: He is  oriented to person, place, and time and well-developed, well-nourished, and in no distress. No distress.  HENT:  Head: Normocephalic and atraumatic.  Eyes: Conjunctivae are normal.  Neck: Neck supple. No thyromegaly present.  Cardiovascular: Normal rate, regular rhythm and normal heart sounds.   No murmur heard. Pulmonary/Chest: Effort normal and breath sounds normal. No respiratory distress.  Abdominal: He exhibits no distension and no mass. There is no tenderness.  Musculoskeletal: He exhibits no edema.       Congenital absence of left hand  Neurological: He is alert and oriented to person, place, and time.  Skin: Skin is warm.  Psychiatric: Memory and affect normal.    No results found for this  basename: TSH   Lab Results  Component Value Date   WBC 8.9 12/25/2008   HGB 16.4 12/25/2008   HCT 47.3 12/25/2008   MCV 88.9 12/25/2008   PLT 250 12/25/2008   Lab Results  Component Value Date   CREATININE 0.97 12/25/2008   BUN 8 12/25/2008   NA 139 12/25/2008   K 4.1 12/25/2008   CL 103 12/25/2008   CO2 30 12/25/2008     Assessment & Plan  Anxiety and depression Patient doing much better on Clonazepam 2-3 times a day, will not require restart of SSRI due to previous side effects and how well he is doing at present. Refill given today and return in 3 months time. Patient much less anxious since being approved for his Social Security  Tobacco abuse Is down to only a couple of cigarettes a day, encouraged complete cessation, counseled for greater than 3 minutes   ADHD (attention deficit hyperactivity disorder) Stable on Adderall 30mg  po bid, given refills today, he will notify us if he has any concerning symptoms

## 2011-07-13 NOTE — Patient Instructions (Signed)
Nicotine Addiction Nicotine can act as both a stimulant (excites/activates) and a sedative (calms/quiets). Immediately after exposure to nicotine, there is a "kick" caused in part by the drug's stimulation of the adrenal glands and resulting discharge of adrenaline (epinephrine). The rush of adrenaline stimulates the body and causes a sudden release of sugar. This means that smokers are always slightly hyperglycemic. Hyperglycemic means that the blood sugar is high, just like in diabetics. Nicotine also decreases the amount of insulin which helps control sugar levels in the body. There is an increase in blood pressure, breathing, and the rate of heart beats.  In addition, nicotine indirectly causes a release of dopamine in the brain that controls pleasure and motivation. A similar reaction is seen with other drugs of abuse, such as cocaine and heroin. This dopamine release is thought to cause the pleasurable sensations when smoking. In some different cases, nicotine can also create a calming effect, depending on sensitivity of the smoker's nervous system and the dose of nicotine taken. WHAT HAPPENS WHEN NICOTINE IS TAKEN FOR LONG PERIODS OF TIME?  Long-term use of nicotine results in addiction. It is difficult to stop.   Repeated use of nicotine creates tolerance. Higher doses of nicotine are needed to get the "kick."  When nicotine use is stopped, withdrawal may last a month or more. Withdrawal may begin within a few hours after the last cigarette. Symptoms peak within the first few days and may lessen within a few weeks. For some people, however, symptoms may last for months or longer. Withdrawal symptoms include:   Irritability.   Craving.   Learning and attention deficits.   Sleep disturbances.   Increased appetite.  Craving for tobacco may last for 6 months or longer. Many behaviors done while using nicotine can also play a part in the severity of withdrawal symptoms. For some people, the  feel, smell, and sight of a cigarette and the ritual of obtaining, handling, lighting, and smoking the cigarette are closely linked with the pleasure of smoking. When stopped, they also miss the related behaviors which make the withdrawal or craving worse. While nicotine gum and patches may lessen the drug aspects of withdrawal, cravings often persist. WHAT ARE THE MEDICAL CONSEQUENCES OF NICOTINE USE?  Nicotine addiction accounts for one-third of all cancers. The top cancer caused by tobacco is lung cancer. Lung cancer is the number one cancer killer of both men and women.   Smoking is also associated with cancers of the:   Mouth.  Pharynx.   Larynx.   Esophagus.  Stomach.   Pancreas.   Cervix.  Kidney.   Ureter.   Bladder.    Smoking also causes lung diseases such as lasting (chronic) bronchitis and emphysema.   It worsens asthma in adults and children.   Smoking increases the risk of heart disease, including:   Stroke.  Heart attack.  Vascular disease.  Aneurysm.   Passive or secondary smoke can also increase medical risks including:   Asthma in children.   Sudden Infant Death Syndrome (SIDS).   Additionally, dropped cigarettes are the leading cause of residential fire fatalities.   Nicotine poisoning has been reported from accidental ingestion of tobacco products by children and pets. Death usually results in a few minutes from respiratory failure (when a person stops breathing) caused by paralysis.  TREATMENT FOR NICOTINE ADDICTION  Medication. Nicotine replacement medicines such as nicotine gum and the patch are used to stop smoking. These medicines gradually lower the dosage of nicotine in  the body. These medicines do not contain the carbon monoxide and other toxins found in tobacco smoke.   Hypnotherapy.   Relaxation therapy.   Nicotine Anonymous (a 12-step support program). Find times and locations in your local yellow pages.  Document Released:  06/20/2004 Document Re-Released: 11/06/2009 [facility] Patient Information 2011 [place], [place].

## 2011-07-13 NOTE — Assessment & Plan Note (Signed)
Stable on Adderall 30mg  po bid, given refills today, he will notify us if he has any concerning symptoms

## 2011-07-13 NOTE — Assessment & Plan Note (Signed)
Is down to only a couple of cigarettes a day, encouraged complete cessation, counseled for greater than 3 minutes

## 2011-07-13 NOTE — Assessment & Plan Note (Addendum)
Patient doing much better on Clonazepam 2-3 times a day, will not require restart of SSRI due to previous side effects and how well he is doing at present. Refill given today and return in 3 months time. Patient much less anxious since being approved for his Social Security

## 2011-07-13 NOTE — Progress Notes (Deleted)
  Subjective:    Patient ID: Jesse Jones, male    DOB: [birth year], 30 y.o.   MRN: [id number]  HPI    Review of Systems     Objective:   Physical Exam        Assessment & Plan:

## 2011-10-03 ENCOUNTER — Encounter: Payer: Self-pay | Admitting: Family Medicine

## 2011-10-03 ENCOUNTER — Ambulatory Visit (INDEPENDENT_AMBULATORY_CARE_PROVIDER_SITE_OTHER): Payer: BC Managed Care – PPO | Admitting: Family Medicine

## 2011-10-03 ENCOUNTER — Ambulatory Visit: Payer: BC Managed Care – PPO | Admitting: Family Medicine

## 2011-10-03 VITALS — BP 118/75 | HR 87 | Temp 98.2°F | Ht 74.0 in | Wt 152.1 lb

## 2011-10-03 DIAGNOSIS — F32A Depression, unspecified: Secondary | ICD-10-CM

## 2011-10-03 DIAGNOSIS — F909 Attention-deficit hyperactivity disorder, unspecified type: Secondary | ICD-10-CM

## 2011-10-03 DIAGNOSIS — F411 Generalized anxiety disorder: Secondary | ICD-10-CM

## 2011-10-03 DIAGNOSIS — F1020 Alcohol dependence, uncomplicated: Secondary | ICD-10-CM | POA: Insufficient documentation

## 2011-10-03 DIAGNOSIS — F17200 Nicotine dependence, unspecified, uncomplicated: Secondary | ICD-10-CM

## 2011-10-03 DIAGNOSIS — Z23 Encounter for immunization: Secondary | ICD-10-CM

## 2011-10-03 DIAGNOSIS — Z0000 Encounter for general adult medical examination without abnormal findings: Secondary | ICD-10-CM

## 2011-10-03 DIAGNOSIS — F419 Anxiety disorder, unspecified: Secondary | ICD-10-CM

## 2011-10-03 DIAGNOSIS — F1011 Alcohol abuse, in remission: Secondary | ICD-10-CM

## 2011-10-03 DIAGNOSIS — F341 Dysthymic disorder: Secondary | ICD-10-CM

## 2011-10-03 DIAGNOSIS — Z720 Tobacco use: Secondary | ICD-10-CM

## 2011-10-03 HISTORY — DX: Encounter for general adult medical examination without abnormal findings: Z00.00

## 2011-10-03 MED ORDER — AMPHETAMINE-DEXTROAMPHETAMINE 30 MG PO TABS: 30.0000 mg | ORAL_TABLET | Freq: Two times a day (BID) | ORAL | Status: DC

## 2011-10-03 MED ORDER — CLONAZEPAM 1 MG PO TABS: 1.0000 mg | ORAL_TABLET | Freq: Three times a day (TID) | ORAL | Status: DC | PRN

## 2011-10-03 NOTE — Assessment & Plan Note (Addendum)
Doing much better at this time, previously had some difficulty with alcohol in past but feels in control of his situation at this time

## 2011-10-03 NOTE — Assessment & Plan Note (Signed)
Has a sponsor but is doing well

## 2011-10-03 NOTE — Assessment & Plan Note (Signed)
Given Tdap today 

## 2011-10-03 NOTE — Patient Instructions (Signed)
Preventative Care for Adults, Male A healthy lifestyle and preventative care can promote health and wellness. Preventative health guidelines for men include the following key practices:  A routine yearly physical is a good way to check with your caregiver about your health and preventative screening. It is a chance to share any concerns and updates on your health, and to receive a thorough exam.   Visit your dentist for a routine exam and preventative care every 6 months. Brush your teeth twice a day and floss once a day. Good oral hygiene prevents tooth decay and gum disease.   The frequency of eye exams is based on your age, health, family medical history, use of contact lenses, and other factors. Follow your caregiver's recommendations for frequency of eye exams.   Eat a healthy diet. Foods like vegetables, fruits, whole grains, low-fat dairy products, and lean protein foods contain the nutrients you need without too many calories. Decrease your intake of foods high in solid fats, added sugars, and salt. Eat the right amount of calories for you.Get information about a proper diet from your caregiver, if necessary.   Regular physical exercise is one of the most important things you can do for your health. Most adults should get at least 150 minutes of moderate-intensity exercise (any activity that increases your heart rate and causes you to sweat) each week. In addition, most adults need muscle-strengthening exercises on 2 or more days a week.   Maintain a healthy weight. The body mass index (BMI) is a screening tool to identify possible weight problems. It provides an estimate of body fat based on height and weight. Your caregiver can help determine your BMI, and can help you achieve or maintain a healthy weight.For adults 20 years and older:   A BMI below 18.5 is considered underweight.   A BMI of 18.5 to 24.9 is normal.   A BMI of 25 to 29.9 is considered overweight.   A BMI of 30 and  above is considered obese.   Maintain normal blood lipids and cholesterol levels by exercising and minimizing your intake of saturated fat. Eat a balanced diet with plenty of fruit and vegetables. Blood tests for lipids and cholesterol should begin at age 60 and be repeated every 5 years. If your lipid or cholesterol levels are high, you are over 50, or you are a high risk for heart disease, you may need your cholesterol levels checked more frequently.Ongoing high lipid and cholesterol levels should be treated with medicines if diet and exercise are not effective.   If you smoke, find out from your caregiver how to quit. If you do not use tobacco, do not start.   If you choose to drink alcohol, do not exceed 2 drinks per day. One drink is considered to be 12 ounces (355 mL) of beer, 5 ounces (148 mL) of wine, or 1.5 ounces (44 mL) of liquor.   Avoid use of street drugs. Do not share needles with anyone. Ask for help if you need support or instructions about stopping the use of drugs.   High blood pressure causes heart disease and increases the risk of stroke. Your blood pressure should be checked at least every 1 to 2 years. Ongoing high blood pressure should be treated with medicines, if weight loss and exercise are not effective.   If you are 24 to 29 years old, ask your caregiver if you should take aspirin to prevent heart disease.   Diabetes screening involves taking a blood  sample to check your fasting blood sugar level. This should be done once every 3 years, after age 46, if you are within normal weight and without risk factors for diabetes. Testing should be considered at a younger age or be carried out more frequently if you are overweight and have at least 1 risk factor for diabetes.   Colorectal cancer can be detected and often prevented. Most routine colorectal cancer screening begins at the age of 5 and continues through age 66. However, your caregiver may recommend screening at an  earlier age if you have risk factors for colon cancer. On a yearly basis, your caregiver may provide home test kits to check for hidden blood in the stool. Use of a small camera at the end of a tube, to directly examine the colon (sigmoidoscopy or colonoscopy), can detect the earliest forms of colorectal cancer. Talk to your caregiver about this at age 64, when routine screening begins. Direct examination of the colon should be repeated every 5 to 10 years through age 102, unless early forms of pre-cancerous polyps or small growths are found.   Practice safe sex. Use condoms and avoid high-risk sexual practices to reduce the spread of sexually transmitted infections (STIs). STIs include gonorrhea, chlamydia, syphilis, trichomonas, herpes, HPV, and human immunodeficiency virus (HIV). Herpes, HIV, and HPV are viral illnesses that have no cure. They can result in disability, cancer, and death.   A one-time screening for abdominal aortic aneurysm (AAA) and surgical repair of large AAAs by sound wave imaging (ultrasonography) is recommended for ages 66 to 61 years who are current or former smokers.   Healthy men should no longer receive prostate-specific antigen (PSA) blood tests as part of routine cancer screening. Consult with your caregiver about prostate cancer screening.   Use sunscreen with skin protection factor (SPF) of 30 or more. Apply sunscreen liberally and repeatedly throughout the day. You should seek shade when your shadow is shorter than you. Protect yourself by wearing long sleeves, pants, a wide-brimmed hat, and sunglasses year round, whenever you are outdoors.   Once a month, do a whole body skin exam, using a mirror to look at the skin on your back. Notify your caregiver of new moles, moles that have irregular borders, moles that are larger than a pencil eraser, or moles that have changed in shape or color.   Stay current with required immunizations.   Influenza. You need a dose every  fall (or winter). The composition of the flu vaccine changes each year, so being vaccinated once is not enough.   Pneumococcal polysaccharide. You need 1 to 2 doses if you smoke cigarettes or if you have certain chronic medical conditions. You need 1 dose at age 13 (or older) if you have never been vaccinated.   Tetanus, diphtheria, pertussis (Tdap, Td). Get 1 dose of Tdap vaccine if you are younger than age 79 years, are over 39 and have contact with an infant, are a Research scientist (physical sciences), or simply want to be protected from whooping cough. After that, you need a Td booster dose every 10 years. Consult your caregiver if you have not had at least 3 tetanus and diphtheria-containing shots sometime in your life or have a deep or dirty wound.   HPV. This vaccine is recommended for males 13 through 29 years of age. This vaccine may be given to men 22 through 29 years of age who have not completed the 3 dose series. It is recommended for men through age 61  who have sex with men or whose immune system is weakened because of HIV infection, other illness, or medications. The vaccine is given in 3 doses over 6 months.   Measles, mumps, rubella (MMR). You need at least 1 dose of MMR if you were born in 1957 or later. You may also need a 2nd dose.   Meningococcal. If you are age 73 to 79 years and a Orthoptist living in a residence hall, or have one of several medical conditions, you need to get vaccinated against meningococcal disease. You may also need additional booster doses.   Zoster (shingles). If you are age 63 years or older, you should get this vaccine.   Varicella (chickenpox). If you have never had chickenpox or you were vaccinated but received only 1 dose, talk to your caregiver to find out if you need this vaccine.   Hepatitis A. You need this vaccine if you have a specific risk factor for hepatitis A virus infection, or you simply wish to be protected from this disease. The vaccine is  usually given as 2 doses, 6 to 18 months apart.   Hepatitis B. You need this vaccine if you have a specific risk factor for hepatitis B virus infection or you simply wish to be protected from this disease. The vaccine is given in 3 doses, usually over 6 months.  Preventative Service / Frequency Ages 38 to 50  Blood pressure check.** / Every 1 to 2 years.   Lipid and cholesterol check.**/ Every 5 years beginning at age 49.   Skin self-exam. / Monthly.   Influenza immunization.** / Every year.   Pneumococcal polysaccharide immunization.** / 1 to 2 doses if you smoke cigarettes or if you have certain chronic medical conditions.   Tetanus, diphtheria, pertussis (Tdap,Td) immunization. / A one-time dose of Tdap vaccine. After that, you need a Td booster dose every 10 years.   HPV immunization. / 3 doses over 6 months, if 26 and younger.   Measles, mumps, rubella (MMR) immunization. / You need at least 1 dose of MMR if you were born in 1957 or later. You may also need a 2nd dose.   Meningococcal immunization. / 1 dose if you are age 61 to 8 years and a Orthoptist living in a residence hall, or have one of several medical conditions, you need to get vaccinated against meningococcal disease. You may also need additional booster doses.   Varicella immunization. **/ Consult your caregiver.   Hepatitis A immunization. ** / Consult your caregiver. 2 doses, 6 to 18 months apart.   Hepatitis B immunization.** / Consult your caregiver. 3 doses usually over 6 months.  Ages 68 to 41  Blood pressure check.** / Every 1 to 2 years.   Lipid and cholesterol check.**/ Every 5 years beginning at age 86.   Fecal occult blood test (FOBT) of stool. / Every year beginning at age 48 and continuing until age 10. You may not have to do this test if you get colonoscopy every 10 years.   Flexible sigmoidoscopy** or colonoscopy.** / Every 5 years for a flexible sigmoidoscopy or every 10 years for  a colonoscopy beginning at age 39 and continuing until age 60.   Skin self-exam. / Monthly.   Influenza immunization.** / Every year.   Pneumococcal polysaccharide immunization.** / 1 to 2 doses if you smoke cigarettes or if you have certain chronic medical conditions.   Tetanus, diphtheria, pertussis (Tdap/Td) immunization.** / A one-time dose of  Tdap vaccine. After that, you need a Td booster dose every 10 years.   Measles, mumps, rubella (MMR) immunization. / You need at least 1 dose of MMR if you were born in 1957 or later. You may also need a 2nd dose.   Varicella immunization. **/ Consult your caregiver.   Meningococcal immunization.** / Consult your caregiver.   Hepatitis A immunization. ** / Consult your caregiver. 2 doses, 6 to 18 months apart.   Hepatitis B immunization.** / Consult your caregiver. 3 doses, usually over 6 months.  Ages 52 and over  Blood pressure check.** / Every 1 to 2 years.   Lipid and cholesterol check.**/ Every 5 years beginning at age 33.   Fecal occult blood test (FOBT) of stool. / Every year beginning at age 1 and continuing until age 29. You may not have to do this test if you get colonoscopy every 10 years.   Flexible sigmoidoscopy** or colonoscopy.** / Every 5 years for a flexible sigmoidoscopy or every 10 years for a colonoscopy beginning at age 87 and continuing until age 66.   Abdominal aortic aneurysm (AAA) screening.** / A one-time screening for ages 79 to 87 years who are current or former smokers.   Skin self-exam. / Monthly.   Influenza immunization.** / Every year.   Pneumococcal polysaccharide immunization.** / 1 dose at age 55 (or older) if you have never been vaccinated.   Tetanus, diphtheria, pertussis (Tdap, Td) immunization. / A one-time dose of Tdap vaccine if you are over 65 and have contact with an infant, are a Research scientist (physical sciences), or simply want to be protected from whooping cough. After that, you need a Td booster dose  every 10 years.   Varicella immunization. **/ Consult your caregiver.   Meningococcal immunization.** / Consult your caregiver.   Hepatitis A immunization. ** / Consult your caregiver. 2 doses, 6 to 18 months apart.   Hepatitis B immunization.** / Check with your caregiver. 3 doses, usually over 6 months.  **Family history and personal history of risk and conditions may change your caregiver's recommendations. Document Released: 12/11/2001 Document Revised: 06/27/2011 Document Reviewed: 03/12/2011 [facility] Patient Information 2012 [place], [place].   Call when you are ready to have labs done, check with insurance first

## 2011-10-03 NOTE — Assessment & Plan Note (Signed)
Doing well on current meds, given refills and he will return for refills in 3 months unless he encounters any difficulties. He is encouraged to consider a baseline set of fasting labs to be done some time in the next 6 months and then he will return for an annual exam in roughly 6 months time

## 2011-10-03 NOTE — Progress Notes (Signed)
Jesse Jones [id number] June 19, 1981 10/03/2011      Progress Note-Follow Up  Subjective  Chief Complaint  Chief Complaint  Patient presents with  . Follow-up    ADHD    HPI  Patient is a 30 year old in today for  multiple medical problems. Overall he feels he is doing well. Adderall holds his ADHD in check and he is to doing a good job of getting his life together. He's gotten disability and is now actively searching for an apartment of his own. He's not having any difficulties with his anxiety depression at this time. The clonazepam to hold the symptoms. Is down to smoking maybe 3 cigarettes daily. He has stayed clear for alcohol for over 8 months now. No recent illness, fevers, chills, chest pain, palpitations, shortness of breath, GI or GU complaints  Past Medical History  Diagnosis Date  . Anxiety   . Depression   . ADD (attention deficit disorder)   . Insomnia   . Outbursts of anger   . Concussion     X 6- 7  . ADHD (attention deficit hyperactivity disorder) 04/02/2011  . 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 Tobacco abuse 04/28/2011    Past Surgical History  Procedure Date  . Punctured lung   . Toe surgeries during childhood     for ingrown toenails    Family History  Problem Relation Age of Onset  . Depression Mother   . Anxiety disorder Mother   . Depression Brother   . Cancer Paternal Grandmother     lung/ didn't smoke    History   Social History  . Marital Status: Single    Spouse Name: N/A    Number of Children: N/A  . Years of Education: N/A   Occupational History  . Not on file.   Social History Main Topics  . Smoking status: Current Everyday Smoker -- 0.3 packs/day    Types: Cigarettes  . Smokeless tobacco: Never Used  . Alcohol Use: Yes     beer occasionally  . Drug Use: No  . Sexually Active: Yes -- Male partner(s)   Other Topics Concern  . Not on file   Social History Narrative  . No  narrative on file    Current Outpatient Prescriptions on File Prior to Visit  Medication Sig Dispense Refill  . clonazePAM (KLONOPIN) 1 MG tablet Take 1 tablet (1 mg total) by mouth 3 (three) times daily as needed. For anxiety and anger  90 tablet  2  . Multiple Vitamin (MULTIVITAMIN) tablet Take 1 tablet by mouth daily.        Marland Kitchen VITAMIN D, CHOLECALCIFEROL, PO Take 1 capsule by mouth daily.          Allergies  Allergen Reactions  . Haloperidol Decanoate   . Penicillins Hives  . Valium     Review of Systems  Review of Systems  Constitutional: Negative for fever and malaise/fatigue.  HENT: Negative for congestion.   Eyes: Negative for discharge.  Respiratory: Negative for shortness of breath.   Cardiovascular: Negative for chest pain, palpitations and leg swelling.  Gastrointestinal: Negative for nausea, abdominal pain and diarrhea.  Genitourinary: Negative for dysuria.  Musculoskeletal: Negative for falls.  Skin: Negative for rash.  Neurological: Negative for loss of consciousness and headaches.  Endo/Heme/Allergies: Negative for polydipsia.  Psychiatric/Behavioral: Negative for depression and suicidal ideas. The patient is not nervous/anxious and does not have insomnia.     Objective  BP 118/75  Pulse 87  Temp(Src) 98.2 F (36.8 C) (Oral)  Ht 6\' 2"  (1.88 m)  Wt 152 lb 1.9 oz (69.001 kg)  BMI 19.53 kg/m2  SpO2 100%  Physical Exam  Physical Exam  Constitutional: He is oriented to person, place, and time and well-developed, well-nourished, and in no distress. No distress.  HENT:  Head: Normocephalic and atraumatic.  Eyes: Conjunctivae are normal.  Neck: Neck supple. No thyromegaly present.  Cardiovascular: Normal rate, regular rhythm, normal heart sounds and intact distal pulses.   No murmur heard. Pulmonary/Chest: Effort normal and breath sounds normal. No respiratory distress.  Abdominal: He exhibits no distension and no mass. There is no tenderness.    Musculoskeletal: Normal range of motion. He exhibits no edema and no tenderness.       Left hand congenitally absent  Neurological: He is alert and oriented to person, place, and time.  Skin: Skin is warm.  Psychiatric: Memory, affect and judgment normal.    No results found for this basename: TSH   Lab Results  Component Value Date   WBC 8.9 12/25/2008   HGB 16.4 12/25/2008   HCT 47.3 12/25/2008   MCV 88.9 12/25/2008   PLT 250 12/25/2008   Lab Results  Component Value Date   CREATININE 0.97 12/25/2008   BUN 8 12/25/2008   NA 139 12/25/2008   K 4.1 12/25/2008   CL 103 12/25/2008   CO2 30 12/25/2008     Assessment & Plan  Preventative health care Given Tdap today  Tobacco abuse Continues to smoke but is down to 3 daily encouraged to completely quit  Anxiety and depression Doing much better at this time, previously had some difficulty with alcohol in past but feels in control of his situation at this time  ADHD (attention deficit hyperactivity disorder) Doing well on current meds, given refills and he will return for refills in 3 months unless he encounters any difficulties. He is encouraged to consider a baseline set of fasting labs to be done some time in the next 6 months and then he will return for an annual exam in roughly 6 months time  Alcohol abuse, in remission Has a sponsor but is doing well

## 2011-10-03 NOTE — Assessment & Plan Note (Signed)
Continues to smoke but is down to 3 daily encouraged to completely quit

## 2011-10-12 ENCOUNTER — Ambulatory Visit: Payer: BC Managed Care – PPO | Admitting: Family Medicine

## 2011-12-24 ENCOUNTER — Ambulatory Visit: Payer: BC Managed Care – PPO | Admitting: Family Medicine

## 2011-12-25 ENCOUNTER — Telehealth: Payer: Self-pay | Admitting: Family Medicine

## 2011-12-25 ENCOUNTER — Emergency Department (HOSPITAL_COMMUNITY)
Admission: EM | Admit: 2011-12-25 | Discharge: 2011-12-25 | Disposition: A | Discharge status: Home / Self Care | Payer: BC Managed Care – PPO | Attending: Emergency Medicine | Admitting: Emergency Medicine

## 2011-12-25 ENCOUNTER — Encounter (HOSPITAL_COMMUNITY): Payer: Self-pay | Admitting: *Deleted

## 2011-12-25 DIAGNOSIS — Z79899 Other long term (current) drug therapy: Secondary | ICD-10-CM | POA: Insufficient documentation

## 2011-12-25 DIAGNOSIS — F17200 Nicotine dependence, unspecified, uncomplicated: Secondary | ICD-10-CM | POA: Insufficient documentation

## 2011-12-25 DIAGNOSIS — Q740 Other congenital malformations of upper limb(s), including shoulder girdle: Secondary | ICD-10-CM | POA: Insufficient documentation

## 2011-12-25 DIAGNOSIS — F988 Other specified behavioral and emotional disorders with onset usually occurring in childhood and adolescence: Secondary | ICD-10-CM | POA: Insufficient documentation

## 2011-12-25 DIAGNOSIS — F3289 Other specified depressive episodes: Secondary | ICD-10-CM | POA: Insufficient documentation

## 2011-12-25 DIAGNOSIS — F329 Major depressive disorder, single episode, unspecified: Secondary | ICD-10-CM | POA: Insufficient documentation

## 2011-12-25 DIAGNOSIS — F909 Attention-deficit hyperactivity disorder, unspecified type: Secondary | ICD-10-CM | POA: Insufficient documentation

## 2011-12-25 DIAGNOSIS — F419 Anxiety disorder, unspecified: Secondary | ICD-10-CM

## 2011-12-25 DIAGNOSIS — F411 Generalized anxiety disorder: Secondary | ICD-10-CM | POA: Insufficient documentation

## 2011-12-25 MED ORDER — ALPRAZOLAM 0.5 MG PO TABS: 0.5000 mg | ORAL_TABLET | Freq: Every evening | ORAL | Status: DC | PRN

## 2011-12-25 MED ORDER — AMPHETAMINE-DEXTROAMPHETAMINE 30 MG PO TABS: 30.0000 mg | ORAL_TABLET | Freq: Every day | ORAL | Status: DC

## 2011-12-25 NOTE — Telephone Encounter (Signed)
Please advise 

## 2011-12-25 NOTE — Telephone Encounter (Signed)
Yes but he needs a visit to change a prescription on a controlled substance, we can call in just 5 tabs to give him time to get in in next 1-2 days. Would start with Xanax 0.25 mg tab 1 tab po bid prn anxiety and he would have to bring in his old Clonazepam for [place] to dispose of

## 2011-12-25 NOTE — ED Notes (Signed)
Received pt. From EMS, pt. Ambulated to room, pt. Alert and oriented, pt. States " I passed out, I think I had a seizure, I ran out of my meds. 3 days ago, I ran out of adderol 2 weeks ago"

## 2011-12-25 NOTE — Telephone Encounter (Signed)
Patient got 5 xanax while he was in the hospital. He feels like he has built up a tolerance for the clonazePAM so can he stop taking the clonazePAM and get an Rx for the xanax?

## 2011-12-25 NOTE — ED Provider Notes (Signed)
History     CSN: [id number]  Arrival date & time 12/25/11  0450   First MD Initiated Contact with Patient 12/25/11 [phone]      Chief Complaint  Patient presents with  . Anxiety   patient with a known history of anxiety, depression, ADD, insomnia. Currently on Adderall and Klonopin in on a twice a day and 3 times a day basis by his primary doctor. He states he was supposed to see his doctor today to get refills on those prescriptions. However, he states he did not feel well enough to go and see his doctor. Patient was not able to give any prescription or reason why. He states that he becomes very anxious. He does not have his medication. He is requesting only a very short refill for his Adderall in either Klonopin or Xanax. Patient does admit that he has been taking more than the prescribed dose because he states he is discuss with his doctor that he is building up a tolerance to the medications. He denies any suicidal or homicidal thoughts. He denies use of any illicit or recreational drugs. Patient has no other concerns at this time.  (Consider location/radiation/quality/duration/timing/severity/associated sxs/prior treatment) HPI  Past Medical History  Diagnosis Date  . Anxiety   . Depression   . ADD (attention deficit disorder)   . Insomnia   . Outbursts of anger   . Concussion     X 6- 7  . ADHD (attention deficit hyperactivity disorder) 04/02/2011  . 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 Tobacco abuse 04/28/2011  . Preventative health care 10/03/2011    Past Surgical History  Procedure Date  . Punctured lung   . Toe surgeries during childhood     for ingrown toenails    Family History  Problem Relation Age of Onset  . Depression Mother   . Anxiety disorder Mother   . Depression Brother   . Cancer Paternal Grandmother     lung/ didn't smoke    History  Substance Use Topics  . Smoking status: Current Everyday Smoker -- 0.3  packs/day    Types: Cigarettes  . Smokeless tobacco: Never Used  . Alcohol Use: Yes     beer occasionally      Review of Systems  All other systems reviewed and are negative.    Allergies  Haloperidol decanoate; Penicillins; and Valium  Home Medications   Current Outpatient Rx  Name Route Sig Dispense Refill  . ALPRAZOLAM 0.5 MG PO TABS Oral Take 1 tablet (0.5 mg total) by mouth at bedtime as needed for sleep. 5 tablet 0  . AMPHETAMINE-DEXTROAMPHETAMINE 30 MG PO TABS Oral Take 1 tablet (30 mg total) by mouth 2 (two) times daily. Feb 2012 rx 60 tablet 0  . AMPHETAMINE-DEXTROAMPHETAMINE 30 MG PO TABS Oral Take 1 tablet (30 mg total) by mouth daily. 5 tablet 0  . CLONAZEPAM 1 MG PO TABS Oral Take 1 tablet (1 mg total) by mouth 3 (three) times daily as needed. For anxiety and anger 90 tablet 2  . ONE-DAILY MULTI VITAMINS PO TABS Oral Take 1 tablet by mouth daily.      Marland Kitchen VITAMIN D (CHOLECALCIFEROL) PO Oral Take 1 capsule by mouth daily.        BP 139/90  Pulse 113  Temp(Src) 98.8 F (37.1 C) (Oral)  Resp 20  SpO2 96%  Physical Exam  Nursing note and vitals reviewed. Constitutional: He is oriented to person, place, and time.  He appears well-developed and well-nourished.  HENT:  Head: Normocephalic and atraumatic.  Eyes: Conjunctivae and EOM are normal. Pupils are equal, round, and reactive to light.  Neck: Neck supple.  Cardiovascular: Normal rate and regular rhythm.  Exam reveals no gallop and no friction rub.   No murmur heard. Pulmonary/Chest: Breath sounds normal. He has no wheezes. He has no rales. He exhibits no tenderness.  Abdominal: Soft. Bowel sounds are normal. He exhibits no distension. There is no tenderness. There is no rebound and no guarding.  Musculoskeletal: Normal range of motion.       Chronic deformity of left hand, congenital. No acute redness, swelling, or edema.  Neurological: He is alert and oriented to person, place, and time. No cranial nerve  deficit. Coordination normal.  Skin: Skin is warm and dry. No rash noted.  Psychiatric: He has a normal mood and affect.       Mildly anxious. No delusions, no psychosis.    ED Course  Procedures (including critical care time)  Labs Reviewed - No data to display No results found.   1. Anxiety   2. ADHD (attention deficit hyperactivity disorder)       MDM  Pt is seen and examined;  Initial history and physical completed.  Will follow.    Patient is given a very short course of Xanax and Adderall. He is supposed to see his doctor today. He was encouraged to follow with her and, in the future, only obtain prescriptions from his primary care physician. The patient was told to return to the ED for any change in thoughts.      Pacey Altizer A. Patrica Duel, MD 12/25/11 [phone]

## 2011-12-25 NOTE — ED Notes (Signed)
Pt. Reports drinking heavy ETOH last few days

## 2011-12-25 NOTE — Discharge Instructions (Signed)
Anxiety and Panic Attacks Anxiety is your body's way of reacting to real danger or something you think is a danger. It may be fear or worry over a situation like losing your job. Sometimes the cause is not known. A panic attack is made up of physical signs like sweating, shaking, or chest pain. Anxiety and panic attacks may start suddenly. They may be strong. They may come at any time of day, even while sleeping. They may come at any time of life. Panic attacks are scary, but they do not harm you physically.  HOME CARE  Avoid any known causes of your anxiety.   Try to relax. Yoga may help. Tell yourself everything will be okay.   Exercise often.   Get expert advice and help (therapy) to stop anxiety or attacks from happening.   Avoid caffeine, alcohol, and drugs.   Only take medicine as told by your doctor.  GET HELP RIGHT AWAY IF:  Your attacks seem different than normal attacks.   Your problems are getting worse or concern you.  MAKE SURE YOU:  Understand these instructions.   Will watch your condition.   Will get help right away if you are not doing well or get worse.  Document Released: 11/17/2010 Document Revised: 06/27/2011 Document Reviewed: 11/17/2010 [facility] Patient Information 2012 [place], [place].Attention Deficit Hyperactivity Disorder Attention deficit hyperactivity disorder (ADHD) is a problem with behavior issues based on the way the brain functions (neurobehavioral disorder). It is a common reason for behavior and academic problems in school. CAUSES  The cause of ADHD is unknown in most cases. It may run in families. It sometimes can be associated with learning disabilities and other behavioral problems. SYMPTOMS  There are 3 types of ADHD. The 3 types and some of the symptoms include:  Inattentive   Gets bored or distracted easily.   Loses or forgets things. Forgets to hand in homework.   Has trouble organizing or completing tasks.   Difficulty staying on  task.   An inability to organize daily tasks and school work.   Leaving projects, chores, or homework unfinished.   Trouble paying attention or responding to details. Careless mistakes.   Difficulty following directions. Often seems like is not listening.   Dislikes activities that require sustained attention (like chores or homework).   Hyperactive-impulsive   Feels like it is impossible to sit still or stay in a seat. Fidgeting with hands and feet.   Trouble waiting turn.   Talking too much or out of turn. Interruptive.   Speaks or acts impulsively.   Aggressive, disruptive behavior.   Constantly busy or on the go, noisy.   Combined   Has symptoms of both of the above.  Often children with ADHD feel discouraged about themselves and with school. They often perform well below their abilities in school. These symptoms can cause problems in home, school, and in relationships with peers. As children get older, the excess motor activities can calm down, but the problems with paying attention and staying organized persist. Most children do not outgrow ADHD but with good treatment can learn to cope with the symptoms. DIAGNOSIS  When ADHD is suspected, the diagnosis should be made by professionals trained in ADHD.  Diagnosis will include:  Ruling out other reasons for the child's behavior.   The caregivers will check with the child's school and check their medical records.   They will talk to teachers and parents.   Behavior rating scales for the child will be  filled out by those dealing with the child on a daily basis.  A diagnosis is made only after all information has been considered. TREATMENT  Treatment usually includes behavioral treatment often along with medicines. It may include stimulant medicines. The stimulant medicines decrease impulsivity and hyperactivity and increase attention. Other medicines used include antidepressants and certain blood pressure medicines. Most  experts agree that treatment for ADHD should address all aspects of the child's functioning. Treatment should not be limited to the use of medicines alone. Treatment should include structured classroom management. The parents must receive education to address rewarding good behavior, discipline, and limit-setting. Tutoring or behavioral therapy or both should be available for the child. If untreated, the disorder can have long-term serious effects into adolescence and adulthood. HOME CARE INSTRUCTIONS   Often with ADHD there is a lot of frustration among the family in dealing with the illness. There is often blame and anger that is not warranted. This is a life long illness. There is no way to prevent ADHD. In many cases, because the problem affects the family as a whole, the entire family may need help. A therapist can help the family find better ways to handle the disruptive behaviors and promote change. If the child is young, most of the therapist's work is with the parents. Parents will learn techniques for coping with and improving their child's behavior. Sometimes only the child with the ADHD needs counseling. Your caregivers can help you make these decisions.   Children with ADHD may need help in organizing. Some helpful tips include:   Keep routines the same every day from wake-up time to bedtime. Schedule everything. This includes homework and playtime. This should include outdoor and indoor recreation. Keep the schedule on the refrigerator or a bulletin board where it is frequently seen. Mark schedule changes as far in advance as possible.   Have a place for everything and keep everything in its place. This includes clothing, backpacks, and school supplies.   Encourage writing down assignments and bringing home needed books.   Offer your child a well-balanced diet. Breakfast is especially important for school performance. Children should avoid drinks with caffeine including:   Soft drinks.     Coffee.   Tea.   However, some older children (adolescents) may find these drinks helpful in improving their attention.   Children with ADHD need consistent rules that they can understand and follow. If rules are followed, give small rewards. Children with ADHD often receive, and expect, criticism. Look for good behavior and praise it. Set realistic goals. Give clear instructions. Look for activities that can foster success and self-esteem. Make time for pleasant activities with your child. Give lots of affection.   Parents are their children's greatest advocates. Learn as much as possible about ADHD. This helps you become a stronger and better advocate for your child. It also helps you educate your child's teachers and instructors if they feel inadequate in these areas. Parent support groups are often helpful. A national group with local chapters is called CHADD (Children and Adults with Attention Deficit Hyperactivity Disorder).  PROGNOSIS  There is no cure for ADHD. Children with the disorder seldom outgrow it. Many find adaptive ways to accommodate the ADHD as they mature. SEEK MEDICAL CARE IF:  Your child has repeated muscle twitches, cough or speech outbursts.   Your child has sleep problems.   Your child has a marked loss of appetite.   Your child develops depression.   Your child has  new or worsening behavioral problems.   Your child develops dizziness.   Your child has a racing heart.   Your child has stomach pains.   Your child develops headaches.  Document Released: 10/05/2002 Document Revised: 06/27/2011 Document Reviewed: 05/17/2008 [facility] Patient Information 2012 [place], [place].

## 2011-12-25 NOTE — ED Notes (Signed)
Pt. Discharge to home, pt. Alert and oriented, ambulatory gait steady

## 2011-12-26 ENCOUNTER — Other Ambulatory Visit: Payer: Self-pay | Admitting: Family Medicine

## 2011-12-26 DIAGNOSIS — F419 Anxiety disorder, unspecified: Secondary | ICD-10-CM

## 2011-12-26 MED ORDER — ALPRAZOLAM 0.5 MG PO TBDP: 0.5000 mg | ORAL_TABLET | Freq: Two times a day (BID) | ORAL | Status: DC | PRN

## 2011-12-26 MED ORDER — ALPRAZOLAM 0.5 MG PO TABS: 0.5000 mg | ORAL_TABLET | Freq: Two times a day (BID) | ORAL | Status: DC | PRN

## 2011-12-26 NOTE — Telephone Encounter (Signed)
My understanding is his insurance has changed and he will be changing practices. I am willing to give him a 2 week supply of Alprazolam 0.5 mg po bid, disp # 14 for now. That will give him time to get an appt with the new office

## 2011-12-26 NOTE — Telephone Encounter (Signed)
Patient contacted the [facility] & they advised Rx wasn't there, does the patient need to pickup the Rx?

## 2011-12-26 NOTE — Telephone Encounter (Signed)
Pt stated the hospital took the Clonazepam? I also noticed pt has xanax .5 on his med list when I went to add the Xanax .25? I asked patient if he still had the Xanax .5 and he stated he did somewhere? Are you still wanting me to send in the Xanax .25 also? Patient wants the Xanax sent to [facility] on [facility] [facility] [address] Please advise?

## 2011-12-27 ENCOUNTER — Ambulatory Visit: Payer: BC Managed Care – PPO | Admitting: Family Medicine

## 2011-12-28 ENCOUNTER — Encounter: Payer: Self-pay | Admitting: Family Medicine

## 2011-12-28 ENCOUNTER — Ambulatory Visit (INDEPENDENT_AMBULATORY_CARE_PROVIDER_SITE_OTHER): Payer: BC Managed Care – PPO | Admitting: Family Medicine

## 2011-12-28 VITALS — BP 120/78 | HR 87 | Temp 97.6°F | Wt 155.0 lb

## 2011-12-28 DIAGNOSIS — F909 Attention-deficit hyperactivity disorder, unspecified type: Secondary | ICD-10-CM

## 2011-12-28 DIAGNOSIS — F17200 Nicotine dependence, unspecified, uncomplicated: Secondary | ICD-10-CM

## 2011-12-28 DIAGNOSIS — Z720 Tobacco use: Secondary | ICD-10-CM

## 2011-12-28 DIAGNOSIS — F1011 Alcohol abuse, in remission: Secondary | ICD-10-CM

## 2011-12-28 DIAGNOSIS — F419 Anxiety disorder, unspecified: Secondary | ICD-10-CM

## 2011-12-28 DIAGNOSIS — F341 Dysthymic disorder: Secondary | ICD-10-CM

## 2011-12-28 DIAGNOSIS — F411 Generalized anxiety disorder: Secondary | ICD-10-CM

## 2011-12-28 MED ORDER — CLONAZEPAM 1 MG PO TABS: 1.0000 mg | ORAL_TABLET | Freq: Three times a day (TID) | ORAL | Status: DC

## 2011-12-28 MED ORDER — AMPHETAMINE-DEXTROAMPHETAMINE 30 MG PO TABS: 30.0000 mg | ORAL_TABLET | Freq: Two times a day (BID) | ORAL | Status: DC

## 2011-12-28 NOTE — Telephone Encounter (Signed)
Per Jesse Jones we can keep patient as a patient at [facility] as long as he has [employer] as his primary insurance which is still does

## 2011-12-28 NOTE — Patient Instructions (Signed)

## 2011-12-30 NOTE — Assessment & Plan Note (Signed)
Stable on Adderall

## 2011-12-30 NOTE — Assessment & Plan Note (Addendum)
Recently moved and misplaced his Clonazepam and unfortunately ended up in the ER with a bad panic attack. He was given a small amount of Alprazolam but does not like the way he feels on that so now that it is time for a refill on his Clonazepam we will give him a refill today. He will return for evaluation if any further concerns

## 2011-12-30 NOTE — Assessment & Plan Note (Signed)
Has cut down to less than a quarter pack of cigarettes. Is encouraged to continue efforts to quit completely

## 2011-12-30 NOTE — Assessment & Plan Note (Signed)
Did relapse recently after moving out of his parents house but has stopped again. He agrees he needs to abstain in the future.

## 2011-12-30 NOTE — Progress Notes (Signed)
Patient ID: Jesse Jones, male   DOB: 1980-11-27, 31 y.o.   MRN: [id number] ZYR[facility]ETLAND [id number] 1981/02/08 12/30/2011      Progress Note-Follow Up  Subjective  Chief Complaint  Chief Complaint  Patient presents with  . Medication Problem    Side effects, xanax not working-makes patient feel more anxious     HPI  Patient is a 31 year old Caucasian male who is in today for hospital followup. Recently moved from his parent's home to his apartment and unfortunately the move misplaced his clonazepam. Had a full-blown panic attack and ended up in the emergency room. With palpitations shortness of breath and severe anxiety. Was given a small amount of insulin which he does not like. He also has started drinking alcohol again but has since stopped as he realizes this also helped to put him over the edge. [place] and that also was difficult and did not go well. Today he says he is doing much better. He is no longer drinking.  Past Medical History  Diagnosis Date  . Anxiety   . Depression   . ADD (attention deficit disorder)   . Insomnia   . Outbursts of anger   . Concussion     X 6- 7  . ADHD (attention deficit hyperactivity disorder) 04/02/2011  . 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 Tobacco abuse 04/28/2011  . Preventative health care 10/03/2011    Past Surgical History  Procedure Date  . Punctured lung   . Toe surgeries during childhood     for ingrown toenails    Family History  Problem Relation Age of Onset  . Depression Mother   . Anxiety disorder Mother   . Depression Brother   . Cancer Paternal Grandmother     lung/ didn't smoke  . Diabetes Brother     History   Social History  . Marital Status: Single    Spouse Name: N/A    Number of Children: N/A  . Years of Education: N/A   Occupational History  . Not on file.   Social History Main Topics  . Smoking status: Current  Everyday Smoker -- 0.3 packs/day    Types: Cigarettes  . Smokeless tobacco: Never Used  . Alcohol Use: Yes     beer occasionally  . Drug Use: No  . Sexually Active: Yes -- Male partner(s)   Other Topics Concern  . Not on file   Social History Narrative  . No narrative on file    Current Outpatient Prescriptions on File Prior to Visit  Medication Sig Dispense Refill  . Multiple Vitamin (MULTIVITAMIN) tablet Take 1 tablet by mouth daily.        Marland Kitchen VITAMIN D, CHOLECALCIFEROL, PO Take 1 capsule by mouth daily.        Marland Kitchen amphetamine-dextroamphetamine (ADDERALL, 30MG ,) 30 MG tablet Take 1 tablet (30 mg total) by mouth daily.  5 tablet  0    Allergies  Allergen Reactions  . Haloperidol Decanoate   . Penicillins Hives  . Valium     Review of Systems  Review of Systems  Constitutional: Negative for fever and malaise/fatigue.  HENT: Negative for congestion.   Eyes: Negative for discharge.  Respiratory: Negative for shortness of breath.   Cardiovascular: Positive for palpitations. Negative for chest pain and leg swelling.  Gastrointestinal: Negative for nausea, abdominal pain and diarrhea.  Genitourinary: Negative for dysuria.  Musculoskeletal:  Negative for falls.  Skin: Negative for rash.  Neurological: Negative for loss of consciousness and headaches.  Endo/Heme/Allergies: Negative for polydipsia.  Psychiatric/Behavioral: Negative for depression and suicidal ideas. The patient is nervous/anxious. The patient does not have insomnia.     Objective  BP 120/78  Pulse 87  Temp(Src) 97.6 F (36.4 C) (Oral)  Wt 155 lb (70.308 kg)  Physical Exam  Physical Exam  Constitutional: He is oriented to person, place, and time and well-developed, well-nourished, and in no distress. No distress.  HENT:  Head: Normocephalic and atraumatic.  Eyes: Conjunctivae are normal.  Neck: Neck supple. No thyromegaly present.  Cardiovascular: Normal rate, regular rhythm and normal heart sounds.    No murmur heard. Pulmonary/Chest: Effort normal and breath sounds normal. No respiratory distress.  Abdominal: He exhibits no distension and no mass. There is no tenderness.  Musculoskeletal: He exhibits no edema.  Neurological: He is alert and oriented to person, place, and time.  Skin: Skin is warm.  Psychiatric: Memory, affect and judgment normal.    No results found for this basename: TSH   Lab Results  Component Value Date   WBC 8.9 12/25/2008   HGB 16.4 12/25/2008   HCT 47.3 12/25/2008   MCV 88.9 12/25/2008   PLT 250 12/25/2008   Lab Results  Component Value Date   CREATININE 0.97 12/25/2008   BUN 8 12/25/2008   NA 139 12/25/2008   K 4.1 12/25/2008   CL 103 12/25/2008   CO2 30 12/25/2008      Assessment & Plan  Alcohol abuse, in remission Did relapse recently after moving out of his parents house but has stopped again. He agrees he needs to abstain in the future.  Anxiety and depression Recently moved and misplaced his Clonazepam and unfortunately ended up in the ER with a bad panic attack. He was given a small amount of Alprazolam but does not like the way he feels on that so now that it is time for a refill on his Clonazepam we will give him a refill today. He will return for evaluation if any further concerns  ADHD (attention deficit hyperactivity disorder) Stable on Adderall  Tobacco abuse Has cut down to less than a quarter pack of cigarettes. Is encouraged to continue efforts to quit completely

## 2012-03-10 ENCOUNTER — Other Ambulatory Visit: Payer: Self-pay

## 2012-03-10 DIAGNOSIS — F419 Anxiety disorder, unspecified: Secondary | ICD-10-CM

## 2012-03-10 NOTE — Telephone Encounter (Signed)
[facility] states the medication was filled on 01-22-12 and 02-16-12. Please advise refill?

## 2012-03-10 NOTE — Telephone Encounter (Signed)
Pt hasn't had refill since 12-28-11 quantity 90 with 1 refill. Script states that this RX was for April and May? Pt states he got the refill on April 20th. I will call [facility] on [address]

## 2012-03-10 NOTE — Telephone Encounter (Signed)
It appears he is not due for this until the end of May? Please double check this for me.

## 2012-03-11 MED ORDER — CLONAZEPAM 1 MG PO TABS: 1.0000 mg | ORAL_TABLET | Freq: Three times a day (TID) | ORAL | Status: DC

## 2012-03-11 NOTE — Telephone Encounter (Signed)
RX called into pharmacy and patient informed

## 2012-03-11 NOTE — Telephone Encounter (Signed)
Addended by: Court Joy on: 03/11/2012 01:19 PM   Modules accepted: Orders

## 2012-03-11 NOTE — Telephone Encounter (Signed)
It should not be due until the end of May. But the pharmacy states that they filled it on 01-22-12 and 02-16-12

## 2012-03-11 NOTE — Telephone Encounter (Signed)
OK well then I am fine to have him have this month's prescription with same sig and same number

## 2012-03-11 NOTE — Telephone Encounter (Signed)
OK to give rx for May and June prescriptions

## 2012-03-11 NOTE — Telephone Encounter (Signed)
Per MD hold off on sending the RX . MD states he can have 3 called into pharmacy until patient comes in tomorrow. I will call RX in and shred other RX for quantity 90 and 1 refill

## 2012-03-11 NOTE — Telephone Encounter (Signed)
Faxed RX and left a detailed message on patients voicemail

## 2012-03-12 ENCOUNTER — Ambulatory Visit: Payer: Medicaid Other | Admitting: Family Medicine

## 2012-03-12 ENCOUNTER — Encounter: Payer: Self-pay | Admitting: Family Medicine

## 2012-03-12 ENCOUNTER — Ambulatory Visit (INDEPENDENT_AMBULATORY_CARE_PROVIDER_SITE_OTHER): Payer: BC Managed Care – PPO | Admitting: Family Medicine

## 2012-03-12 VITALS — BP 124/90 | HR 118 | Temp 98.8°F | Ht 74.0 in | Wt 152.8 lb

## 2012-03-12 DIAGNOSIS — F419 Anxiety disorder, unspecified: Secondary | ICD-10-CM

## 2012-03-12 DIAGNOSIS — F341 Dysthymic disorder: Secondary | ICD-10-CM

## 2012-03-12 DIAGNOSIS — F411 Generalized anxiety disorder: Secondary | ICD-10-CM

## 2012-03-12 DIAGNOSIS — F909 Attention-deficit hyperactivity disorder, unspecified type: Secondary | ICD-10-CM

## 2012-03-12 MED ORDER — AMPHETAMINE-DEXTROAMPHETAMINE 30 MG PO TABS: ORAL_TABLET | ORAL | Status: DC

## 2012-03-12 MED ORDER — SERTRALINE HCL 50 MG PO TABS: ORAL_TABLET | ORAL | Status: DC

## 2012-03-12 MED ORDER — CLONAZEPAM 1 MG PO TABS: 1.0000 mg | ORAL_TABLET | Freq: Three times a day (TID) | ORAL | Status: DC | PRN

## 2012-03-12 NOTE — Assessment & Plan Note (Addendum)
Patient agrees to restart SSRIs, will start with Sertraline 25 mg daily and increase to 50 mg daily after a week. He has been using excessive Klonopin. He is accompanied by his mother, he signs a drug contract today and agrees to stick with the limits he has been given on his meds. He has been warned that we will not be able to continue prescribing meds if he runs out too soon. He is encouraged to start back in counseling and to consider volunteer work to occupy his time more.

## 2012-03-12 NOTE — Patient Instructions (Signed)

## 2012-03-12 NOTE — Assessment & Plan Note (Signed)
Has run out of his Adderal prematurely. He is given a refill that he cannot fill until 5/20 and warned that he cannot have run out prematurely again

## 2012-03-12 NOTE — Progress Notes (Signed)
Patient ID: Jesse Jones, male   DOB: October 22, 1981, 31 y.o.   MRN: [id number] [name] [id number] 1980-12-14 03/12/2012      Progress Note-Follow Up  Subjective  Chief Complaint  Chief Complaint  Patient presents with  . Follow-up    Klonopin refill    HPI  Patient is a 31 year old Caucasian male who is in today accompanied by his mother. He has recently moved out of his family home now but he is on his disability and acknowledges she's fallen into a deeper depression. As a result he was taking more of his medication secondary to panic and anxiety attacks and was prescribed. Is here today to sign contracts and to agree to follow the prescriptions were precisely. He is warned he will no longer and to get medications here if he is unable to follow the guidelines. He denies suicidal ideation but doesn't know she's been struggling with anhedonia and lack of motivation for quite some time. No physical complaints other than the anxiety and panic attacks. No chest pain palpitations, shortness of breath when he is in his normal state of health. No recent febrile illness or other concerns.  Past Medical History  Diagnosis Date  . Anxiety   . Depression   . ADD (attention deficit disorder)   . Insomnia   . Outbursts of anger   . Concussion     X 6- 7  . ADHD (attention deficit hyperactivity disorder) 04/02/2011  . 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 Tobacco abuse 04/28/2011  . Preventative health care 10/03/2011    Past Surgical History  Procedure Date  . Punctured lung   . Toe surgeries during childhood     for ingrown toenails    Family History  Problem Relation Age of Onset  . Depression Mother   . Anxiety disorder Mother   . Depression Brother   . Cancer Paternal Grandmother     lung/ didn't smoke  . Diabetes Brother     History   Social History  . Marital Status: Single    Spouse Name: N/A    Number of Children: N/A  .  Years of Education: N/A   Occupational History  . Not on file.   Social History Main Topics  . Smoking status: Current Everyday Smoker -- 0.3 packs/day    Types: Cigarettes  . Smokeless tobacco: Never Used  . Alcohol Use: Yes     beer occasionally  . Drug Use: No  . Sexually Active: Yes -- Male partner(s)   Other Topics Concern  . Not on file   Social History Narrative  . No narrative on file    Current Outpatient Prescriptions on File Prior to Visit  Medication Sig Dispense Refill  . Multiple Vitamin (MULTIVITAMIN) tablet Take 1 tablet by mouth daily.        Marland Kitchen VITAMIN D, CHOLECALCIFEROL, PO Take 1 capsule by mouth daily.        Marland Kitchen DISCONTD: amphetamine-dextroamphetamine (ADDERALL, 30MG ,) 30 MG tablet Take 1 tablet (30 mg total) by mouth 2 (two) times daily. May 2013 rx  60 tablet  0  . DISCONTD: clonazePAM (KLONOPIN) 1 MG tablet Take 1 tablet (1 mg total) by mouth 3 (three) times daily.  3 tablet  0  . amphetamine-dextroamphetamine (ADDERALL, 30MG ,) 30 MG tablet Take 1 tablet (30 mg total) by mouth 2 (two) times daily.  60 tablet  0  . amphetamine-dextroamphetamine (ADDERALL, 30MG ,) 30 MG tablet Take 1  tablet (30 mg total) by mouth daily.  5 tablet  0  . sertraline (ZOLOFT) 50 MG tablet 1/2 tab po daily x 7 days, then increase to 1 tab po daily  30 tablet  3    Allergies  Allergen Reactions  . Haloperidol Decanoate   . Penicillins Hives  . Valium     Review of Systems  Review of Systems  Constitutional: Negative for fever and malaise/fatigue.  HENT: Negative for congestion.   Eyes: Negative for discharge.  Respiratory: Negative for shortness of breath.   Cardiovascular: Negative for chest pain, palpitations and leg swelling.  Gastrointestinal: Negative for nausea, abdominal pain and diarrhea.  Genitourinary: Negative for dysuria.  Musculoskeletal: Negative for falls.  Skin: Negative for rash.  Neurological: Negative for loss of consciousness and headaches.    Endo/Heme/Allergies: Negative for polydipsia.  Psychiatric/Behavioral: Positive for depression and substance abuse. Negative for suicidal ideas. The patient is nervous/anxious and has insomnia.     Objective  BP 124/90  Pulse 118  Temp(Src) 98.8 F (37.1 C) (Temporal)  Ht 6\' 2"  (1.88 m)  Wt 152 lb 12.8 oz (69.31 kg)  BMI 19.62 kg/m2  SpO2 96%  Physical Exam  Physical Exam  Constitutional: He is oriented to person, place, and time and well-developed, well-nourished, and in no distress. No distress.  HENT:  Head: Normocephalic and atraumatic.  Eyes: Conjunctivae are normal.  Neck: Neck supple. No thyromegaly present.  Cardiovascular: Normal rate, regular rhythm and normal heart sounds.   No murmur heard. Pulmonary/Chest: Effort normal and breath sounds normal. No respiratory distress.  Abdominal: He exhibits no distension and no mass. There is no tenderness.  Musculoskeletal: He exhibits no edema.  Neurological: He is alert and oriented to person, place, and time.  Skin: Skin is warm.  Psychiatric: Memory, affect and judgment normal.    No results found for this basename: TSH   Lab Results  Component Value Date   WBC 8.9 12/25/2008   HGB 16.4 12/25/2008   HCT 47.3 12/25/2008   MCV 88.9 12/25/2008   PLT 250 12/25/2008   Lab Results  Component Value Date   CREATININE 0.97 12/25/2008   BUN 8 12/25/2008   NA 139 12/25/2008   K 4.1 12/25/2008   CL 103 12/25/2008   CO2 30 12/25/2008      Assessment & Plan  Anxiety and depression Patient agrees to restart SSRIs, will start with Sertraline 25 mg daily and increase to 50 mg daily after a week. He has been using excessive Klonopin. He is accompanied by his mother, he signs a drug contract today and agrees to stick with the limits he has been given on his meds. He has been warned that we will not be able to continue prescribing meds if he runs out too soon. He is encouraged to start back in counseling and to consider volunteer  work to occupy his time more.  ADHD (attention deficit hyperactivity disorder) Has run out of his Adderal prematurely. He is given a refill that he cannot fill until 5/20 and warned that he cannot have run out prematurely again    30 minutes is spent strictly in counseling patient before PE is done

## 2012-03-19 ENCOUNTER — Telehealth: Payer: Self-pay | Admitting: Family Medicine

## 2012-03-19 NOTE — Telephone Encounter (Signed)
Patient called back & said he found the paper Rx in his car. He doesn't need another Rx.

## 2012-03-19 NOTE — Telephone Encounter (Signed)
Sorry I just cannot do it. He is going to have to get through until the next prescription is due. I cannot fill it early again. If he would like a referral to psychiatry to discuss his options I would be happy to do that

## 2012-03-19 NOTE — Telephone Encounter (Signed)
Please advise 

## 2012-04-14 ENCOUNTER — Encounter: Payer: Self-pay | Admitting: Family Medicine

## 2012-04-14 ENCOUNTER — Ambulatory Visit (INDEPENDENT_AMBULATORY_CARE_PROVIDER_SITE_OTHER): Payer: BC Managed Care – PPO | Admitting: Family Medicine

## 2012-04-14 VITALS — BP 136/85 | HR 94 | Temp 98.7°F | Ht 74.0 in | Wt 165.8 lb

## 2012-04-14 DIAGNOSIS — F909 Attention-deficit hyperactivity disorder, unspecified type: Secondary | ICD-10-CM

## 2012-04-14 DIAGNOSIS — Z720 Tobacco use: Secondary | ICD-10-CM

## 2012-04-14 DIAGNOSIS — F419 Anxiety disorder, unspecified: Secondary | ICD-10-CM

## 2012-04-14 DIAGNOSIS — F341 Dysthymic disorder: Secondary | ICD-10-CM

## 2012-04-14 DIAGNOSIS — F17200 Nicotine dependence, unspecified, uncomplicated: Secondary | ICD-10-CM

## 2012-04-14 MED ORDER — AMPHETAMINE-DEXTROAMPHETAMINE 30 MG PO TABS: 30.0000 mg | ORAL_TABLET | Freq: Two times a day (BID) | ORAL | Status: DC

## 2012-04-14 MED ORDER — CITALOPRAM HYDROBROMIDE 20 MG PO TABS: 20.0000 mg | ORAL_TABLET | Freq: Every day | ORAL | Status: DC

## 2012-04-14 NOTE — Patient Instructions (Addendum)
Nicotine Addiction Nicotine can act as both a stimulant (excites/activates) and a sedative (calms/quiets). Immediately after exposure to nicotine, there is a "kick" caused in part by the drug's stimulation of the adrenal glands and resulting discharge of adrenaline (epinephrine). The rush of adrenaline stimulates the body and causes a sudden release of sugar. This means that smokers are always slightly hyperglycemic. Hyperglycemic means that the blood sugar is high, just like in diabetics. Nicotine also decreases the amount of insulin which helps control sugar levels in the body. There is an increase in blood pressure, breathing, and the rate of heart beats.  In addition, nicotine indirectly causes a release of dopamine in the brain that controls pleasure and motivation. A similar reaction is seen with other drugs of abuse, such as cocaine and heroin. This dopamine release is thought to cause the pleasurable sensations when smoking. In some different cases, nicotine can also create a calming effect, depending on sensitivity of the smoker's nervous system and the dose of nicotine taken. WHAT HAPPENS WHEN NICOTINE IS TAKEN FOR LONG PERIODS OF TIME?  Long-term use of nicotine results in addiction. It is difficult to stop.   Repeated use of nicotine creates tolerance. Higher doses of nicotine are needed to get the "kick."  When nicotine use is stopped, withdrawal may last a month or more. Withdrawal may begin within a few hours after the last cigarette. Symptoms peak within the first few days and may lessen within a few weeks. For some people, however, symptoms may last for months or longer. Withdrawal symptoms include:   Irritability.   Craving.   Learning and attention deficits.   Sleep disturbances.   Increased appetite.  Craving for tobacco may last for 6 months or longer. Many behaviors done while using nicotine can also play a part in the severity of withdrawal symptoms. For some people, the  feel, smell, and sight of a cigarette and the ritual of obtaining, handling, lighting, and smoking the cigarette are closely linked with the pleasure of smoking. When stopped, they also miss the related behaviors which make the withdrawal or craving worse. While nicotine gum and patches may lessen the drug aspects of withdrawal, cravings often persist. WHAT ARE THE MEDICAL CONSEQUENCES OF NICOTINE USE?  Nicotine addiction accounts for one-third of all cancers. The top cancer caused by tobacco is lung cancer. Lung cancer is the number one cancer killer of both men and women.   Smoking is also associated with cancers of the:   Mouth.   Pharynx.   Larynx.   Esophagus.   Stomach.   Pancreas.   Cervix.   Kidney.   Ureter.   Bladder.   Smoking also causes lung diseases such as lasting (chronic) bronchitis and emphysema.   It worsens asthma in adults and children.   Smoking increases the risk of heart disease, including:   Stroke.   Heart attack.   Vascular disease.   Aneurysm.   Passive or secondary smoke can also increase medical risks including:   Asthma in children.   Sudden Infant Death Syndrome (SIDS).   Additionally, dropped cigarettes are the leading cause of residential fire fatalities.   Nicotine poisoning has been reported from accidental ingestion of tobacco products by children and pets. Death usually results in a few minutes from respiratory failure (when a person stops breathing) caused by paralysis.  TREATMENT   Medication. Nicotine replacement medicines such as nicotine gum and the patch are used to stop smoking. These medicines gradually lower the dosage   of nicotine in the body. These medicines do not contain the carbon monoxide and other toxins found in tobacco smoke.   Hypnotherapy.   Relaxation therapy.   Nicotine Anonymous (a 12-step support program). Find times and locations in your local yellow pages.  Document Released: 06/20/2004 Document  Revised: 10/04/2011 Document Reviewed: 11/12/2007 [employer] Patient Information 2012 [employer]. 

## 2012-04-14 NOTE — Assessment & Plan Note (Signed)
Smoking about 6 cigarettes a day, encouraged complete cessation to prevent further complications to his health

## 2012-04-14 NOTE — Progress Notes (Signed)
Patient ID: Jesse Jones, male   DOB: Sep 24, 1981, 31 y.o.   MRN: [id number] Jesse Jones [id number] [birth year] 04/14/2012      Progress Note-Follow Up  Subjective  Chief Complaint  Chief Complaint  Patient presents with  . Follow-up    on zoloft    HPI  Patient is a 31 year old Caucasian male in today for follow up. He tried taking his Sertraline daily for several weeks but he never got over the nausea or fatigue so he stopped it about 2 weeks ago. He feels over all better but acknowledges he continues to feel depressed, no suicidal ideation. He would like to restart Citalopram which he has tolerated in the past. He did struggle with some ED with it but other wise tolerated it. No acute c/o. No CP/palp/SOB/f/c/GI or GU c/o.  Past Medical History  Diagnosis Date  . Anxiety   . Depression   . ADD (attention deficit disorder)   . Insomnia   . Outbursts of anger   . Concussion     X 6- 7  . ADHD (attention deficit hyperactivity disorder) 04/02/2011  . 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 Tobacco abuse 04/28/2011  . Preventative health care 10/03/2011    Past Surgical History  Procedure Date  . Punctured lung   . Toe surgeries during childhood     for ingrown toenails    Family History  Problem Relation Age of Onset  . Depression Mother   . Anxiety disorder Mother   . Depression Brother   . Cancer Paternal Grandmother     lung/ didn't smoke  . Diabetes Brother     History   Social History  . Marital Status: Single    Spouse Name: N/A    Number of Children: N/A  . Years of Education: N/A   Occupational History  . Not on file.   Social History Main Topics  . Smoking status: Current Everyday Smoker -- 0.3 packs/day    Types: Cigarettes  . Smokeless tobacco: Never Used  . Alcohol Use: Yes     beer occasionally  . Drug Use: No  . Sexually Active: Yes -- Male partner(s)   Other Topics Concern  . Not on file    Social History Narrative  . No narrative on file    Current Outpatient Prescriptions on File Prior to Visit  Medication Sig Dispense Refill  . Multiple Vitamin (MULTIVITAMIN) tablet Take 1 tablet by mouth daily.        Marland Kitchen VITAMIN D, CHOLECALCIFEROL, PO Take 1 capsule by mouth daily.        Marland Kitchen DISCONTD: amphetamine-dextroamphetamine (ADDERALL, 30MG ,) 30 MG tablet may fill on Mar 17 2012 rx  60 tablet  0  . citalopram (CELEXA) 20 MG tablet Take 1 tablet (20 mg total) by mouth daily.  30 tablet  3  . clonazePAM (KLONOPIN) 1 MG tablet Take 1 tablet (1 mg total) by mouth 3 (three) times daily as needed for anxiety. 1 tab po tid and 1 extra tab daily for panic or severe symptoms as needed  100 tablet  1  . DISCONTD: amphetamine-dextroamphetamine (ADDERALL, 30MG ,) 30 MG tablet Take 1 tablet (30 mg total) by mouth 2 (two) times daily.  60 tablet  0  . DISCONTD: amphetamine-dextroamphetamine (ADDERALL, 30MG ,) 30 MG tablet Take 1 tablet (30 mg total) by mouth daily.  5 tablet  0    Allergies  Allergen Reactions  . Haloperidol Decanoate   .  Penicillins Hives  . Valium     Review of Systems  Review of Systems  Constitutional: Negative for fever and malaise/fatigue.  HENT: Negative for congestion.   Eyes: Negative for discharge.  Respiratory: Negative for shortness of breath.   Cardiovascular: Negative for chest pain, palpitations and leg swelling.  Gastrointestinal: Negative for nausea, abdominal pain and diarrhea.  Genitourinary: Negative for dysuria.  Musculoskeletal: Negative for falls.  Skin: Negative for rash.  Neurological: Negative for loss of consciousness and headaches.  Endo/Heme/Allergies: Negative for polydipsia.  Psychiatric/Behavioral: Positive for depression. Negative for suicidal ideas. The patient is nervous/anxious. The patient does not have insomnia.     Objective  BP 136/85  Pulse 94  Temp 98.7 F (37.1 C) (Temporal)  Ht 6\' 2"  (1.88 m)  Wt 165 lb 12.8 oz  (75.206 kg)  BMI 21.29 kg/m2  SpO2 99%  Physical Exam  Physical Exam  Constitutional: He is oriented to person, place, and time and well-developed, well-nourished, and in no distress. No distress.  HENT:  Head: Normocephalic and atraumatic.  Eyes: Conjunctivae are normal.  Neck: Neck supple. No thyromegaly present.  Cardiovascular: Normal rate, regular rhythm and normal heart sounds.   No murmur heard. Pulmonary/Chest: Effort normal and breath sounds normal. No respiratory distress.  Abdominal: He exhibits no distension and no mass. There is no tenderness.  Musculoskeletal: He exhibits no edema.  Neurological: He is alert and oriented to person, place, and time.  Skin: Skin is warm.  Psychiatric: Memory, affect and judgment normal.    No results found for this basename: TSH   Lab Results  Component Value Date   WBC 8.9 12/25/2008   HGB 16.4 12/25/2008   HCT 47.3 12/25/2008   MCV 88.9 12/25/2008   PLT 250 12/25/2008   Lab Results  Component Value Date   CREATININE 0.97 12/25/2008   BUN 8 12/25/2008   NA 139 12/25/2008   K 4.1 12/25/2008   CL 103 12/25/2008   CO2 30 12/25/2008     Assessment & Plan  Anxiety and depression Stopped his Sertraline about 2 weeks due to GI side effects, would like to restart Citalopram 20 mg daily.   Tobacco abuse Smoking about 6 cigarettes a day, encouraged complete cessation to prevent further complications to his health  ADHD (attention deficit hyperactivity disorder) Given refill on his Adderal today. 1 month supply return in 3-4 weeks

## 2012-04-14 NOTE — Assessment & Plan Note (Signed)
Given refill on his Adderal today. 1 month supply return in 3-4 weeks

## 2012-04-14 NOTE — Assessment & Plan Note (Signed)
Stopped his Sertraline about 2 weeks due to GI side effects, would like to restart Citalopram 20 mg daily.

## 2012-05-12 ENCOUNTER — Encounter: Payer: Self-pay | Admitting: Family Medicine

## 2012-05-12 ENCOUNTER — Ambulatory Visit (INDEPENDENT_AMBULATORY_CARE_PROVIDER_SITE_OTHER): Payer: BC Managed Care – PPO | Admitting: Family Medicine

## 2012-05-12 VITALS — BP 124/76 | HR 90 | Temp 99.1°F | Ht 74.0 in | Wt 162.0 lb

## 2012-05-12 DIAGNOSIS — N529 Male erectile dysfunction, unspecified: Secondary | ICD-10-CM

## 2012-05-12 DIAGNOSIS — F32A Depression, unspecified: Secondary | ICD-10-CM

## 2012-05-12 DIAGNOSIS — F419 Anxiety disorder, unspecified: Secondary | ICD-10-CM

## 2012-05-12 DIAGNOSIS — F1011 Alcohol abuse, in remission: Secondary | ICD-10-CM

## 2012-05-12 DIAGNOSIS — F411 Generalized anxiety disorder: Secondary | ICD-10-CM

## 2012-05-12 DIAGNOSIS — F329 Major depressive disorder, single episode, unspecified: Secondary | ICD-10-CM

## 2012-05-12 DIAGNOSIS — F17200 Nicotine dependence, unspecified, uncomplicated: Secondary | ICD-10-CM

## 2012-05-12 DIAGNOSIS — Z720 Tobacco use: Secondary | ICD-10-CM

## 2012-05-12 DIAGNOSIS — F909 Attention-deficit hyperactivity disorder, unspecified type: Secondary | ICD-10-CM

## 2012-05-12 DIAGNOSIS — F341 Dysthymic disorder: Secondary | ICD-10-CM

## 2012-05-12 HISTORY — DX: Male erectile dysfunction, unspecified: N52.9

## 2012-05-12 MED ORDER — AMPHETAMINE-DEXTROAMPHETAMINE 30 MG PO TABS: 30.0000 mg | ORAL_TABLET | Freq: Two times a day (BID) | ORAL | Status: DC

## 2012-05-12 MED ORDER — CLONAZEPAM 1 MG PO TABS: 1.0000 mg | ORAL_TABLET | Freq: Three times a day (TID) | ORAL | Status: DC | PRN

## 2012-05-12 NOTE — Assessment & Plan Note (Addendum)
Has to quit smoking completely and reassess in a couple of months

## 2012-05-12 NOTE — Assessment & Plan Note (Signed)
Happy with the Celexa and is using Klonopin less as a result as well. Continue the same dosing for now, refills given

## 2012-05-12 NOTE — Assessment & Plan Note (Signed)
Smoking roughly 7 cigarettes daily, encouraged to quit completely

## 2012-05-12 NOTE — Patient Instructions (Addendum)
Nicotine Addiction Nicotine can act as both a stimulant (excites/activates) and a sedative (calms/quiets). Immediately after exposure to nicotine, there is a "kick" caused in part by the drug's stimulation of the adrenal glands and resulting discharge of adrenaline (epinephrine). The rush of adrenaline stimulates the body and causes a sudden release of sugar. This means that smokers are always slightly hyperglycemic. Hyperglycemic means that the blood sugar is high, just like in diabetics. Nicotine also decreases the amount of insulin which helps control sugar levels in the body. There is an increase in blood pressure, breathing, and the rate of heart beats.  In addition, nicotine indirectly causes a release of dopamine in the brain that controls pleasure and motivation. A similar reaction is seen with other drugs of abuse, such as cocaine and heroin. This dopamine release is thought to cause the pleasurable sensations when smoking. In some different cases, nicotine can also create a calming effect, depending on sensitivity of the smoker's nervous system and the dose of nicotine taken. WHAT HAPPENS WHEN NICOTINE IS TAKEN FOR LONG PERIODS OF TIME?  Long-term use of nicotine results in addiction. It is difficult to stop.   Repeated use of nicotine creates tolerance. Higher doses of nicotine are needed to get the "kick."  When nicotine use is stopped, withdrawal may last a month or more. Withdrawal may begin within a few hours after the last cigarette. Symptoms peak within the first few days and may lessen within a few weeks. For some people, however, symptoms may last for months or longer. Withdrawal symptoms include:   Irritability.   Craving.   Learning and attention deficits.   Sleep disturbances.   Increased appetite.  Craving for tobacco may last for 6 months or longer. Many behaviors done while using nicotine can also play a part in the severity of withdrawal symptoms. For some people, the  feel, smell, and sight of a cigarette and the ritual of obtaining, handling, lighting, and smoking the cigarette are closely linked with the pleasure of smoking. When stopped, they also miss the related behaviors which make the withdrawal or craving worse. While nicotine gum and patches may lessen the drug aspects of withdrawal, cravings often persist. WHAT ARE THE MEDICAL CONSEQUENCES OF NICOTINE USE?  Nicotine addiction accounts for one-third of all cancers. The top cancer caused by tobacco is lung cancer. Lung cancer is the number one cancer killer of both men and women.   Smoking is also associated with cancers of the:   Mouth.   Pharynx.   Larynx.   Esophagus.   Stomach.   Pancreas.   Cervix.   Kidney.   Ureter.   Bladder.   Smoking also causes lung diseases such as lasting (chronic) bronchitis and emphysema.   It worsens asthma in adults and children.   Smoking increases the risk of heart disease, including:   Stroke.   Heart attack.   Vascular disease.   Aneurysm.   Passive or secondary smoke can also increase medical risks including:   Asthma in children.   Sudden Infant Death Syndrome (SIDS).   Additionally, dropped cigarettes are the leading cause of residential fire fatalities.   Nicotine poisoning has been reported from accidental ingestion of tobacco products by children and pets. Death usually results in a few minutes from respiratory failure (when a person stops breathing) caused by paralysis.  TREATMENT   Medication. Nicotine replacement medicines such as nicotine gum and the patch are used to stop smoking. These medicines gradually lower the dosage   of nicotine in the body. These medicines do not contain the carbon monoxide and other toxins found in tobacco smoke.   Hypnotherapy.   Relaxation therapy.   Nicotine Anonymous (a 12-step support program). Find times and locations in your local yellow pages.  Document Released: 06/20/2004 Document  Revised: 10/04/2011 Document Reviewed: 11/12/2007 [employer] Patient Information 2012 [employer]. 

## 2012-05-12 NOTE — Assessment & Plan Note (Signed)
Continues to do well. No concerns

## 2012-05-12 NOTE — Assessment & Plan Note (Signed)
Refill given rf today on Adderal

## 2012-05-12 NOTE — Progress Notes (Signed)
Patient ID: Jesse Jones, male   DOB: August 22, 1981, 31 y.o.   MRN: [id number] Jesse Jones [id number] 1981-04-06 05/12/2012      Progress Note-Follow Up  Subjective  Chief Complaint  Chief Complaint  Patient presents with  . Follow-up    on meds    HPI  Patient is a 31 year caucasian male in today for follow up. Doing well on Celexa at current dosing of 20 mg daily. It is up in his anxiety is down. He is taking less Klonopin and often uses only a half a tablet he needs it. As noted some mild ED but that was more notable when he was still drinking alcohol and at present he continues to abstain. He is not presently sure whether he would continue to have trouble. He continues to smoke 7 cigarettes a day. But overall he says he is doing well. No recent illness fevers chills angry outbursts or insomnia noted  Past Medical History  Diagnosis Date  . Anxiety   . Depression   . ADD (attention deficit disorder)   . Insomnia   . Outbursts of anger   . Concussion     X 6- 7  . ADHD (attention deficit hyperactivity disorder) 04/02/2011  . 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 Tobacco abuse 04/28/2011  . Preventative health care 10/03/2011  . ED (erectile dysfunction) 05/12/2012    Past Surgical History  Procedure Date  . Punctured lung   . Toe surgeries during childhood     for ingrown toenails    Family History  Problem Relation Age of Onset  . Depression Mother   . Anxiety disorder Mother   . Depression Brother   . Cancer Paternal Grandmother     lung/ didn't smoke  . Diabetes Brother     History   Social History  . Marital Status: Single    Spouse Name: N/A    Number of Children: N/A  . Years of Education: N/A   Occupational History  . Not on file.   Social History Main Topics  . Smoking status: Current Everyday Smoker -- 0.3 packs/day    Types: Cigarettes  . Smokeless tobacco: Never Used  . Alcohol Use: Yes     beer  occasionally  . Drug Use: No  . Sexually Active: Yes -- Male partner(s)   Other Topics Concern  . Not on file   Social History Narrative  . No narrative on file    Current Outpatient Prescriptions on File Prior to Visit  Medication Sig Dispense Refill  . citalopram (CELEXA) 20 MG tablet Take 1 tablet (20 mg total) by mouth daily.  30 tablet  3  . Multiple Vitamin (MULTIVITAMIN) tablet Take 1 tablet by mouth daily.        Marland Kitchen VITAMIN D, CHOLECALCIFEROL, PO Take 1 capsule by mouth daily.        Marland Kitchen DISCONTD: amphetamine-dextroamphetamine (ADDERALL, 30MG ,) 30 MG tablet Take 1 tablet (30 mg total) by mouth 2 (two) times daily. June 2013 rx  60 tablet  0  . DISCONTD: clonazePAM (KLONOPIN) 1 MG tablet Take 1 tablet (1 mg total) by mouth 3 (three) times daily as needed for anxiety. 1 tab po tid and 1 extra tab daily for panic or severe symptoms as needed  100 tablet  1    Allergies  Allergen Reactions  . Haloperidol Decanoate   . Penicillins Hives  . Valium     Review of  Systems  Review of Systems  Constitutional: Negative for fever and malaise/fatigue.  HENT: Negative for congestion.   Eyes: Negative for discharge.  Respiratory: Negative for shortness of breath.   Cardiovascular: Negative for chest pain, palpitations and leg swelling.  Gastrointestinal: Negative for nausea, abdominal pain and diarrhea.  Genitourinary: Negative for dysuria.  Musculoskeletal: Negative for falls.  Skin: Negative for rash.  Neurological: Negative for loss of consciousness and headaches.  Endo/Heme/Allergies: Negative for polydipsia.  Psychiatric/Behavioral: Negative for depression and suicidal ideas. The patient is not nervous/anxious and does not have insomnia.     Objective  BP 124/76  Pulse 90  Temp 99.1 F (37.3 C) (Temporal)  Ht 6\' 2"  (1.88 m)  Wt 162 lb (73.483 kg)  BMI 20.80 kg/m2  SpO2 99%  Physical Exam  Physical Exam  Constitutional: He is oriented to person, place, and time  and well-developed, well-nourished, and in no distress. No distress.  HENT:  Head: Normocephalic and atraumatic.  Eyes: Conjunctivae are normal.  Neck: Neck supple. No thyromegaly present.  Cardiovascular: Normal rate, regular rhythm and normal heart sounds.   No murmur heard. Pulmonary/Chest: Effort normal and breath sounds normal. No respiratory distress.  Abdominal: He exhibits no distension and no mass. There is no tenderness.  Musculoskeletal: He exhibits no edema.  Neurological: He is alert and oriented to person, place, and time.  Skin: Skin is warm.  Psychiatric: Memory, affect and judgment normal.    No results found for this basename: TSH   Lab Results  Component Value Date   WBC 8.9 12/25/2008   HGB 16.4 12/25/2008   HCT 47.3 12/25/2008   MCV 88.9 12/25/2008   PLT 250 12/25/2008   Lab Results  Component Value Date   CREATININE 0.97 12/25/2008   BUN 8 12/25/2008   NA 139 12/25/2008   K 4.1 12/25/2008   CL 103 12/25/2008   CO2 30 12/25/2008    Assessment & Plan  Alcohol abuse, in remission Continues to do well. No concerns  Anxiety and depression Happy with the Celexa and is using Klonopin less as a result as well. Continue the same dosing for now, refills given  ADHD (attention deficit hyperactivity disorder) Refill given rf today on Adderal   Tobacco abuse Smoking roughly 7 cigarettes daily, encouraged to quit completely  ED (erectile dysfunction) Has to quit smoking completely and reassess in a couple of months

## 2012-07-07 ENCOUNTER — Encounter: Payer: Self-pay | Admitting: Family Medicine

## 2012-07-07 ENCOUNTER — Ambulatory Visit (INDEPENDENT_AMBULATORY_CARE_PROVIDER_SITE_OTHER): Payer: BC Managed Care – PPO | Admitting: Family Medicine

## 2012-07-07 VITALS — BP 127/89 | HR 111 | Temp 98.3°F | Ht 74.0 in | Wt 145.8 lb

## 2012-07-07 DIAGNOSIS — F419 Anxiety disorder, unspecified: Secondary | ICD-10-CM

## 2012-07-07 DIAGNOSIS — F909 Attention-deficit hyperactivity disorder, unspecified type: Secondary | ICD-10-CM

## 2012-07-07 DIAGNOSIS — F17200 Nicotine dependence, unspecified, uncomplicated: Secondary | ICD-10-CM | POA: Diagnosis not present

## 2012-07-07 DIAGNOSIS — R000 Tachycardia, unspecified: Secondary | ICD-10-CM | POA: Diagnosis not present

## 2012-07-07 DIAGNOSIS — F341 Dysthymic disorder: Secondary | ICD-10-CM

## 2012-07-07 DIAGNOSIS — Z720 Tobacco use: Secondary | ICD-10-CM

## 2012-07-07 DIAGNOSIS — F329 Major depressive disorder, single episode, unspecified: Secondary | ICD-10-CM

## 2012-07-07 LAB — TSH: TSH: 2.26 u[IU]/mL (ref 0.35–5.50)

## 2012-07-07 MED ORDER — NEBIVOLOL HCL 5 MG PO TABS: 2.5000 mg | ORAL_TABLET | Freq: Every day | ORAL | Status: DC

## 2012-07-07 MED ORDER — AMPHETAMINE-DEXTROAMPHETAMINE 30 MG PO TABS: 30.0000 mg | ORAL_TABLET | Freq: Two times a day (BID) | ORAL | Status: DC

## 2012-07-07 NOTE — Assessment & Plan Note (Signed)
Continues to smoke encouraged to stop once again

## 2012-07-07 NOTE — Progress Notes (Signed)
Patient ID: Jesse Jones, male   DOB: 16-Dec-1980, 31 y.o.   MRN: [id number] Jesse Jones [id number] [birth year] 07/07/2012      Progress Note-Follow Up  Subjective  Chief Complaint  Chief Complaint  Patient presents with  . Follow-up    on meds    HPI  Patient is a 31 year old Caucasian male who is in today for reevaluation of multiple medical problems. Overall he continues to nearly Adderall is helpful and he does not take it as frequently as he once did. He took just half a pill today. He stopped the citalopram as he no longer felt that he needed it and he says he is doing better since stopping it. He feels less spacey and he denies any worsening anxiety or depression. He does not he does occasional palpitations and his social anxiety can be limiting at times. No recent illness. No alcohol consumption. No fevers, chills, chest pain, shortness of breath, GI or GU complaints. Past Medical History  Diagnosis Date  . Anxiety   . Depression   . ADD (attention deficit disorder)   . Insomnia   . Outbursts of anger   . Concussion     X 6- 7  . ADHD (attention deficit hyperactivity disorder) 04/02/2011  . 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 Tobacco abuse 04/28/2011  . Preventative health care 10/03/2011  . ED (erectile dysfunction) 05/12/2012    Past Surgical History  Procedure Date  . Punctured lung   . Toe surgeries during childhood     for ingrown toenails    Family History  Problem Relation Age of Onset  . Depression Mother   . Anxiety disorder Mother   . Depression Brother   . Cancer Paternal Grandmother     lung/ didn't smoke  . Diabetes Brother     History   Social History  . Marital Status: Single    Spouse Name: N/A    Number of Children: N/A  . Years of Education: N/A   Occupational History  . Not on file.   Social History Main Topics  . Smoking status: Current Everyday Smoker -- 0.3 packs/day    Types:  Cigarettes  . Smokeless tobacco: Never Used  . Alcohol Use: Yes     beer occasionally  . Drug Use: No  . Sexually Active: Yes -- Male partner(s)   Other Topics Concern  . Not on file   Social History Narrative  . No narrative on file    Current Outpatient Prescriptions on File Prior to Visit  Medication Sig Dispense Refill  . clonazePAM (KLONOPIN) 1 MG tablet Take 1 tablet (1 mg total) by mouth 3 (three) times daily as needed for anxiety. 1 tab po tid and 1 extra tab daily for panic or severe symptoms as needed  100 tablet  1  . Multiple Vitamin (MULTIVITAMIN) tablet Take 1 tablet by mouth daily.        Marland Kitchen VITAMIN D, CHOLECALCIFEROL, PO Take 1 capsule by mouth daily.        Marland Kitchen DISCONTD: amphetamine-dextroamphetamine (ADDERALL) 30 MG tablet Take 1 tablet (30 mg total) by mouth 2 (two) times daily. August 2013 rx  60 tablet  0  . nebivolol (BYSTOLIC) 5 MG tablet Take 0.5 tablets (2.5 mg total) by mouth daily.  56 tablet  0    Allergies  Allergen Reactions  . Haloperidol Decanoate   . Penicillins Hives  . Valium     Review  of Systems  Review of Systems  Constitutional: Positive for diaphoresis. Negative for fever and malaise/fatigue.  HENT: Negative for congestion.   Eyes: Negative for discharge.  Respiratory: Negative for shortness of breath.   Cardiovascular: Positive for palpitations. Negative for chest pain and leg swelling.  Gastrointestinal: Negative for nausea, abdominal pain and diarrhea.  Genitourinary: Negative for dysuria.  Musculoskeletal: Negative for falls.  Skin: Negative for rash.  Neurological: Negative for loss of consciousness and headaches.  Endo/Heme/Allergies: Negative for polydipsia.  Psychiatric/Behavioral: Positive for depression. Negative for suicidal ideas. The patient is nervous/anxious. The patient does not have insomnia.     Objective  BP 127/89  Pulse 111  Temp 98.3 F (36.8 C) (Temporal)  Ht 6\' 2"  (1.88 m)  Wt 145 lb 12.8 oz (66.134  kg)  BMI 18.72 kg/m2  SpO2 98%  Physical Exam  Physical Exam  Constitutional: He is oriented to person, place, and time and well-developed, well-nourished, and in no distress. No distress.  HENT:  Head: Normocephalic and atraumatic.  Eyes: Conjunctivae are normal.  Neck: Neck supple. No thyromegaly present.  Cardiovascular: Normal rate, regular rhythm and normal heart sounds.   No murmur heard. Pulmonary/Chest: Effort normal and breath sounds normal. No respiratory distress.  Abdominal: He exhibits no distension and no mass. There is no tenderness.  Musculoskeletal: He exhibits no edema.  Neurological: He is alert and oriented to person, place, and time.  Skin: Skin is warm.  Psychiatric: Memory, affect and judgment normal.    No results found for this basename: TSH   Lab Results  Component Value Date   WBC 8.9 12/25/2008   HGB 16.4 12/25/2008   HCT 47.3 12/25/2008   MCV 88.9 12/25/2008   PLT 250 12/25/2008   Lab Results  Component Value Date   CREATININE 0.97 12/25/2008   BUN 8 12/25/2008   NA 139 12/25/2008   K 4.1 12/25/2008   CL 103 12/25/2008   CO2 30 12/25/2008     Assessment & Plan  Anxiety and depression Has stopped the Citalopram and says he feels somewhat better and less foggy headed, no worsening depression and anxiety. Using Klonazepam infrequently   Tobacco abuse Continues to smoke encouraged to stop once again  Tachycardia Patient started on bystolic 5 mg tabs, 1/2 tab po daily and recheck bp and pulse in one month or as needed  ADHD (attention deficit hyperactivity disorder) Given refills on Adderral for next 3 months may have to consider cutting back if tachycardia.

## 2012-07-07 NOTE — Assessment & Plan Note (Signed)
Patient started on bystolic 5 mg tabs, 1/2 tab po daily and recheck bp and pulse in one month or as needed

## 2012-07-07 NOTE — Patient Instructions (Addendum)
Tachycardia, Nonspecific  In adults, the heart normally beats between 60 and 100 times a minute. A heart rate over 100 is called tachycardia. When your heart beats too fast, it may not be able to pump enough blood to the rest of the body.  CAUSES    Exercise or exertion.   Fever.   Pain or injury.   Infection.   Loss of fluid (dehydration).   Overactive thyroid.   Lack of red blood cells (anemia).   Anxiety.   Alcohol.   Heart arrhythmia.   Caffeine.   Tobacco products.   Diet pills.   Street drugs.   Heart disease.  SYMPTOMS   Palpitations (rapid or irregular heartbeat).   Dizziness.   Tiredness (fatigue).   Shortness of breath.  DIAGNOSIS   After an exam and taking a history, your caregiver may order:   Blood tests.   Electrocardiogram (EKG).   Heart monitor.  TREATMENT   Treatment will depend on the cause and potential for harm. It may include:   Intravenous (IV) replacement of fluids or blood.   Antidote or reversal medicines.   Changes in your present medicines.   Lifestyle changes.  HOME CARE INSTRUCTIONS    Get rest.   Drink enough water and fluids to keep your urine clear or pale yellow.   Avoid:   Caffeine.   Nicotine.   Alcohol.   Stress.   Chocolate.   Stimulants.   Only take medicine as directed by your caregiver.  SEEK IMMEDIATE MEDICAL CARE IF:    You have pain in your chest, upper arms, jaw, or neck.   You become weak, dizzy, or feel faint.   You have palpitations that will not go away.   You throw up (vomit), have diarrhea, or pass blood.   You look pale and your skin is cool and wet.  MAKE SURE YOU:    Understand these instructions.   Will watch your condition.   Will get help right away if you are not doing well or get worse.  Document Released: 11/22/2004 Document Revised: 10/04/2011 Document Reviewed: 09/25/2011  [employer] Patient Information 2012 [employer].

## 2012-07-07 NOTE — Assessment & Plan Note (Signed)
Has stopped the Citalopram and says he feels somewhat better and less foggy headed, no worsening depression and anxiety. Using Klonazepam infrequently

## 2012-07-07 NOTE — Assessment & Plan Note (Signed)
Given refills on Adderral for next 3 months may have to consider cutting back if tachycardia.

## 2012-07-18 ENCOUNTER — Other Ambulatory Visit: Payer: Self-pay | Admitting: Family Medicine

## 2012-07-18 DIAGNOSIS — F419 Anxiety disorder, unspecified: Secondary | ICD-10-CM

## 2012-07-18 MED ORDER — CLONAZEPAM 1 MG PO TABS: 1.0000 mg | ORAL_TABLET | Freq: Three times a day (TID) | ORAL | Status: DC | PRN

## 2012-07-18 NOTE — Telephone Encounter (Signed)
OK to refill same sig, same number 1 refill

## 2012-07-18 NOTE — Telephone Encounter (Signed)
Please advise Clonazepam refill? Last RX wrote on 05-12-12 quantity 100 X 1 refill.  Fax to [phone]

## 2012-09-18 ENCOUNTER — Other Ambulatory Visit: Payer: Self-pay | Admitting: Family Medicine

## 2012-09-18 DIAGNOSIS — F419 Anxiety disorder, unspecified: Secondary | ICD-10-CM

## 2012-09-18 MED ORDER — CLONAZEPAM 1 MG PO TABS: 1.0000 mg | ORAL_TABLET | Freq: Three times a day (TID) | ORAL | Status: DC | PRN

## 2012-09-18 NOTE — Telephone Encounter (Signed)
rx faxed

## 2012-09-18 NOTE — Telephone Encounter (Signed)
Please advise refill? Last RX wrote on 07-18-12 quantity 100 with 1 refill

## 2012-09-29 ENCOUNTER — Ambulatory Visit (INDEPENDENT_AMBULATORY_CARE_PROVIDER_SITE_OTHER): Payer: Medicare Other | Admitting: Family Medicine

## 2012-09-29 ENCOUNTER — Encounter: Payer: Self-pay | Admitting: Family Medicine

## 2012-09-29 VITALS — BP 105/70 | HR 90 | Temp 99.4°F | Ht 74.0 in | Wt 150.8 lb

## 2012-09-29 DIAGNOSIS — F909 Attention-deficit hyperactivity disorder, unspecified type: Secondary | ICD-10-CM | POA: Diagnosis not present

## 2012-09-29 DIAGNOSIS — F341 Dysthymic disorder: Secondary | ICD-10-CM

## 2012-09-29 DIAGNOSIS — Z720 Tobacco use: Secondary | ICD-10-CM

## 2012-09-29 DIAGNOSIS — F329 Major depressive disorder, single episode, unspecified: Secondary | ICD-10-CM

## 2012-09-29 DIAGNOSIS — F17200 Nicotine dependence, unspecified, uncomplicated: Secondary | ICD-10-CM | POA: Diagnosis not present

## 2012-09-29 DIAGNOSIS — R000 Tachycardia, unspecified: Secondary | ICD-10-CM | POA: Diagnosis not present

## 2012-09-29 DIAGNOSIS — Z23 Encounter for immunization: Secondary | ICD-10-CM | POA: Diagnosis not present

## 2012-09-29 DIAGNOSIS — F419 Anxiety disorder, unspecified: Secondary | ICD-10-CM

## 2012-09-29 MED ORDER — AMPHETAMINE-DEXTROAMPHETAMINE 30 MG PO TABS: 30.0000 mg | ORAL_TABLET | Freq: Two times a day (BID) | ORAL | Status: DC

## 2012-09-29 MED ORDER — ESCITALOPRAM OXALATE 10 MG PO TABS: ORAL_TABLET | ORAL | Status: DC

## 2012-09-29 NOTE — Patient Instructions (Signed)

## 2012-09-29 NOTE — Assessment & Plan Note (Signed)
Given refills on his Adderall today

## 2012-09-29 NOTE — Progress Notes (Signed)
Patient ID: Jesse Jones, male   DOB: 03-17-1981, 31 y.o.   MRN: [id number] Jesse Jones [id number] Apr 04, 1981 09/29/2012      Progress Note-Follow Up  Subjective  Chief Complaint  Chief Complaint  Patient presents with  . Follow-up    medication follow up    HPI  Patient is a 31 year old Caucasian male who is in today for followup. Overall he is doing well but acknowledges his depression is getting worse again. He was talking with his mom and she suggested she try Lexapro. Works well for her with minimal side effects and he agrees. He was frustrated with the ED but occurred with the Zoloft and Celexa. He never started the Bystolic as instructed but denies any palpitations or chest pain. No complaint of headache recent illness fevers chills GI or GU concerns at this time. He did note Zoloft it caused some loose stool. He continues to smoke about 6-8 cigarettes a day but is not having any difficulty with substance is otherwise.  Past Medical History  Diagnosis Date  . Anxiety   . Depression   . ADD (attention deficit disorder)   . Insomnia   . Outbursts of anger   . Concussion     X 6- 7  . ADHD (attention deficit hyperactivity disorder) 04/02/2011  . 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 Tobacco abuse 04/28/2011  . Preventative health care 10/03/2011  . ED (erectile dysfunction) 05/12/2012    Past Surgical History  Procedure Date  . Punctured lung   . Toe surgeries during childhood     for ingrown toenails    Family History  Problem Relation Age of Onset  . Depression Mother   . Anxiety disorder Mother   . Depression Brother   . Cancer Paternal Grandmother     lung/ didn't smoke  . Diabetes Brother     History   Social History  . Marital Status: Single    Spouse Name: N/A    Number of Children: N/A  . Years of Education: N/A   Occupational History  . Not on file.   Social History Main Topics  . Smoking status:  Current Every Day Smoker -- 0.3 packs/day    Types: Cigarettes  . Smokeless tobacco: Never Used  . Alcohol Use: Yes     Comment: beer occasionally  . Drug Use: No  . Sexually Active: Yes -- Male partner(s)   Other Topics Concern  . Not on file   Social History Narrative  . No narrative on file    Current Outpatient Prescriptions on File Prior to Visit  Medication Sig Dispense Refill  . clonazePAM (KLONOPIN) 1 MG tablet Take 1 tablet (1 mg total) by mouth 3 (three) times daily as needed for anxiety. 1 tab po tid and 1 extra tab daily for panic or severe symptoms as needed  100 tablet  1  . Multiple Vitamin (MULTIVITAMIN) tablet Take 1 tablet by mouth daily.        Marland Kitchen VITAMIN D, CHOLECALCIFEROL, PO Take 1 capsule by mouth daily.        . [DISCONTINUED] amphetamine-dextroamphetamine (ADDERALL) 30 MG tablet Take 1 tablet (30 mg total) by mouth 2 (two) times daily. November 2013 rx  60 tablet  0  . escitalopram (LEXAPRO) 10 MG tablet 1/2 tab po daily x 7 days and then 1 tab po daily as tolerated  30 tablet  2  . nebivolol (BYSTOLIC) 5 MG tablet Take  0.5 tablets (2.5 mg total) by mouth daily.  56 tablet  0    Allergies  Allergen Reactions  . Haloperidol Decanoate   . Penicillins Hives  . Valium     Review of Systems  Review of Systems  Constitutional: Negative for fever and malaise/fatigue.  HENT: Negative for congestion.   Eyes: Negative for discharge.  Respiratory: Negative for shortness of breath.   Cardiovascular: Negative for chest pain, palpitations and leg swelling.  Gastrointestinal: Negative for nausea, abdominal pain and diarrhea.  Genitourinary: Negative for dysuria.  Musculoskeletal: Negative for falls.  Skin: Negative for rash.  Neurological: Negative for loss of consciousness and headaches.  Endo/Heme/Allergies: Negative for polydipsia.  Psychiatric/Behavioral: Positive for depression. Negative for suicidal ideas. The patient is nervous/anxious and has insomnia.      Objective  BP 105/70  Pulse 90  Temp 99.4 F (37.4 C) (Temporal)  Ht 6\' 2"  (1.88 m)  Wt 150 lb 12.8 oz (68.402 kg)  BMI 19.36 kg/m2  SpO2 100%  Physical Exam  Physical Exam  Constitutional: He is oriented to person, place, and time and well-developed, well-nourished, and in no distress. No distress.  HENT:  Head: Normocephalic and atraumatic.  Eyes: Conjunctivae normal are normal.  Neck: Neck supple. No thyromegaly present.  Cardiovascular: Normal rate, regular rhythm and normal heart sounds.   No murmur heard. Pulmonary/Chest: Effort normal and breath sounds normal. No respiratory distress.  Abdominal: He exhibits no distension and no mass. There is no tenderness.  Musculoskeletal: He exhibits no edema.  Neurological: He is alert and oriented to person, place, and time.  Skin: Skin is warm.  Psychiatric: Memory, affect and judgment normal.    Lab Results  Component Value Date   TSH 2.26 07/07/2012   Lab Results  Component Value Date   WBC 8.9 12/25/2008   HGB 16.4 12/25/2008   HCT 47.3 12/25/2008   MCV 88.9 12/25/2008   PLT 250 12/25/2008   Lab Results  Component Value Date   CREATININE 0.97 12/25/2008   BUN 8 12/25/2008   NA 139 12/25/2008   K 4.1 12/25/2008   CL 103 12/25/2008   CO2 30 12/25/2008      Assessment & Plan  Anxiety and depression Is requesting a switch to Lexapro will start with 10 mg tab 1/2 tab daily for a week and then increase to a full tab daily after that. Using Klonopin prn   Tachycardia Mild and improved on recheck, encouraged him to avoid caffeine and decrease nicotine.  Tobacco abuse Down to 6-8 cigarettes a day, encouraged him to continue trying to cut back, perhaps to a 1/4 ppd prior to next visit in preparation of complete cessation  ADHD (attention deficit hyperactivity disorder) Given refills on his Adderall today

## 2012-09-29 NOTE — Assessment & Plan Note (Addendum)
Is requesting a switch to Lexapro will start with 10 mg tab 1/2 tab daily for a week and then increase to a full tab daily after that. Using Klonopin prn

## 2012-09-29 NOTE — Assessment & Plan Note (Signed)
Down to 6-8 cigarettes a day, encouraged him to continue trying to cut back, perhaps to a 1/4 ppd prior to next visit in preparation of complete cessation

## 2012-09-29 NOTE — Assessment & Plan Note (Addendum)
Mild and improved on recheck, encouraged him to avoid caffeine and decrease nicotine.

## 2012-11-25 ENCOUNTER — Telehealth: Payer: Self-pay | Admitting: Family Medicine

## 2012-11-25 NOTE — Telephone Encounter (Signed)
adderall  Patient is out.  The 30 day rx ran out this am.  Pharmacy says they cannot refill till February 1.  The rx was filled on December 30th.  His 30 days was up today.  [facility].  Can you contact [facility] and get them to fill this rx today.

## 2012-11-25 NOTE — Telephone Encounter (Signed)
[facility] called back regarding this.

## 2012-11-25 NOTE — Telephone Encounter (Signed)
pts mother informed per verbal from md that the Adderall can't be filled until Nov 29, 2012. It is to early

## 2012-12-26 ENCOUNTER — Ambulatory Visit (INDEPENDENT_AMBULATORY_CARE_PROVIDER_SITE_OTHER): Payer: Medicare Other | Admitting: Family Medicine

## 2012-12-26 ENCOUNTER — Encounter: Payer: Self-pay | Admitting: Family Medicine

## 2012-12-26 VITALS — BP 138/89 | HR 116 | Temp 99.0°F | Ht 74.0 in | Wt 157.8 lb

## 2012-12-26 DIAGNOSIS — F17200 Nicotine dependence, unspecified, uncomplicated: Secondary | ICD-10-CM

## 2012-12-26 DIAGNOSIS — F909 Attention-deficit hyperactivity disorder, unspecified type: Secondary | ICD-10-CM | POA: Diagnosis not present

## 2012-12-26 DIAGNOSIS — R000 Tachycardia, unspecified: Secondary | ICD-10-CM | POA: Diagnosis not present

## 2012-12-26 DIAGNOSIS — F341 Dysthymic disorder: Secondary | ICD-10-CM

## 2012-12-26 DIAGNOSIS — Z720 Tobacco use: Secondary | ICD-10-CM

## 2012-12-26 DIAGNOSIS — F419 Anxiety disorder, unspecified: Secondary | ICD-10-CM

## 2012-12-26 MED ORDER — AMPHETAMINE-DEXTROAMPHETAMINE 30 MG PO TABS: 30.0000 mg | ORAL_TABLET | Freq: Two times a day (BID) | ORAL | Status: DC

## 2012-12-26 MED ORDER — ALPRAZOLAM ER 3 MG PO TB24: 3.0000 mg | ORAL_TABLET | ORAL | Status: DC

## 2012-12-26 NOTE — Assessment & Plan Note (Signed)
Given refills on his Adderall today. No changes.

## 2012-12-26 NOTE — Assessment & Plan Note (Signed)
lexapro caused too much nausea so he stopped it.

## 2012-12-26 NOTE — Patient Instructions (Addendum)
No Sudafed/Pseduoephedrine Use plain Mucinex 600 mg twice a day and start a probiotic such as Digestive Advantage or Phillip's colon health  Viral Infections A virus is a type of germ. Viruses can cause:  Minor sore throats.  Aches and pains.  Headaches.o   Runny nose.  Rashes.  Watery eyes.  Tiredness.  Coughs.  Loss of appetite.  Feeling sick to your stomach (nausea).  Throwing up (vomiting).  Watery poop (diarrhea). HOME CARE   Only take medicines as told by your doctor.  Drink enough water and fluids to keep your pee (urine) clear or pale yellow. Sports drinks are a good choice.  Get plenty of rest and eat healthy. Soups and broths with crackers or rice are fine. GET HELP RIGHT AWAY IF:   You have a very bad headache.  You have shortness of breath.  You have chest pain or neck pain.  You have an unusual rash.  You cannot stop throwing up.  You have watery poop that does not stop.  You cannot keep fluids down.  You or your child has a temperature by mouth above 102 F (38.9 C), not controlled by medicine.  Your baby is older than 3 months with a rectal temperature of 102 F (38.9 C) or higher.  Your baby is 55 months old or younger with a rectal temperature of 100.4 F (38 C) or higher. MAKE SURE YOU:   Understand these instructions.  Will watch this condition.  Will get help right away if you are not doing well or get worse. Document Released: 09/27/2008 Document Revised: 01/07/2012 Document Reviewed: 02/20/2011 [facility] Patient Information 2013 [place], [place].

## 2012-12-28 NOTE — Progress Notes (Signed)
Patient ID: Jesse Jones, male   DOB: 1981/05/10, 32 y.o.   MRN: [id number] Jesse Jones [id number] 04/21/1981 12/28/2012      Progress Note-Follow Up  Subjective  Chief Complaint  Chief Complaint  Patient presents with  . Follow-up    adderall refill    HPI  Patient is a 32 year old Caucasian male who is in today for followup. He reports he stopped Lexapro because it causes nausea. He continues to use his Adderall with good effect. Has been having a great deal of stress secondary to some trouble since and personal relationships. Did not start the Bystolic as directed. No recent illness. No fevers or chills. No chest pain, shortness of breath, GI or GU complaints.  Past Medical History  Diagnosis Date  . Anxiety   . Depression   . ADD (attention deficit disorder)   . Insomnia   . Outbursts of anger   . Concussion     X 6- 7  . ADHD (attention deficit hyperactivity disorder) 04/02/2011  . 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 Tobacco abuse 04/28/2011  . Preventative health care 10/03/2011  . ED (erectile dysfunction) 05/12/2012    Past Surgical History  Procedure Laterality Date  . Punctured lung    . Toe surgeries  during childhood     for ingrown toenails    Family History  Problem Relation Age of Onset  . Depression Mother   . Anxiety disorder Mother   . Depression Brother   . Cancer Paternal Grandmother     lung/ didn't smoke  . Diabetes Brother     History   Social History  . Marital Status: Single    Spouse Name: N/A    Number of Children: N/A  . Years of Education: N/A   Occupational History  . Not on file.   Social History Main Topics  . Smoking status: Current Every Day Smoker -- 0.30 packs/day    Types: Cigarettes  . Smokeless tobacco: Never Used  . Alcohol Use: Yes     Comment: beer occasionally  . Drug Use: No  . Sexually Active: Yes -- Male partner(s)   Other Topics Concern  . Not on file    Social History Narrative  . No narrative on file    Current Outpatient Prescriptions on File Prior to Visit  Medication Sig Dispense Refill  . Multiple Vitamin (MULTIVITAMIN) tablet Take 1 tablet by mouth daily.        Marland Kitchen VITAMIN D, CHOLECALCIFEROL, PO Take 1 capsule by mouth daily.        . nebivolol (BYSTOLIC) 5 MG tablet Take 0.5 tablets (2.5 mg total) by mouth daily.  56 tablet  0   No current facility-administered medications on file prior to visit.    Allergies  Allergen Reactions  . Haloperidol Decanoate   . Penicillins Hives  . Valium     Review of Systems  Review of Systems  Constitutional: Negative for fever and malaise/fatigue.  HENT: Negative for congestion.   Eyes: Negative for discharge.  Respiratory: Negative for shortness of breath.   Cardiovascular: Negative for chest pain, palpitations and leg swelling.  Gastrointestinal: Negative for nausea, abdominal pain and diarrhea.  Genitourinary: Negative for dysuria.  Musculoskeletal: Negative for falls.  Skin: Negative for rash.  Neurological: Negative for loss of consciousness and headaches.  Endo/Heme/Allergies: Negative for polydipsia.  Psychiatric/Behavioral: Negative for depression and suicidal ideas. The patient is not nervous/anxious and does not have  insomnia.     Objective  BP 138/89  Pulse 116  Temp(Src) 99 F (37.2 C) (Temporal)  Ht 6\' 2"  (1.88 m)  Wt 157 lb 12.8 oz (71.578 kg)  BMI 20.25 kg/m2  SpO2 100%  Physical Exam  Physical Exam  Constitutional: He is oriented to person, place, and time and well-developed, well-nourished, and in no distress. No distress.  HENT:  Head: Normocephalic and atraumatic.  Eyes: Conjunctivae are normal.  Neck: Neck supple. No thyromegaly present.  Cardiovascular: Normal rate, regular rhythm and normal heart sounds.   No murmur heard. Pulmonary/Chest: Effort normal and breath sounds normal. No respiratory distress.  Abdominal: He exhibits no distension  and no mass. There is no tenderness.  Musculoskeletal: He exhibits no edema.  Neurological: He is alert and oriented to person, place, and time.  Skin: Skin is warm.  Psychiatric: Memory, affect and judgment normal.    Lab Results  Component Value Date   TSH 2.26 07/07/2012   Lab Results  Component Value Date   WBC 8.9 12/25/2008   HGB 16.4 12/25/2008   HCT 47.3 12/25/2008   MCV 88.9 12/25/2008   PLT 250 12/25/2008   Lab Results  Component Value Date   CREATININE 0.97 12/25/2008   BUN 8 12/25/2008   NA 139 12/25/2008   K 4.1 12/25/2008   CL 103 12/25/2008   CO2 30 12/25/2008     Assessment & Plan  ADHD (attention deficit hyperactivity disorder) Given refills on his Adderall today. No changes.  Anxiety and depression lexapro caused too much nausea so he stopped it.   Tachycardia Has not started the Bystolic agrees to do so today.  Tobacco abuse Continues to smoke encouraged to cut down.

## 2012-12-28 NOTE — Assessment & Plan Note (Signed)
Has not started the Bystolic agrees to do so today.

## 2012-12-28 NOTE — Assessment & Plan Note (Signed)
Continues to smoke encouraged to cut down.

## 2012-12-29 DIAGNOSIS — H52209 Unspecified astigmatism, unspecified eye: Secondary | ICD-10-CM | POA: Diagnosis not present

## 2012-12-29 DIAGNOSIS — H40019 Open angle with borderline findings, low risk, unspecified eye: Secondary | ICD-10-CM | POA: Diagnosis not present

## 2012-12-29 DIAGNOSIS — H5210 Myopia, unspecified eye: Secondary | ICD-10-CM | POA: Diagnosis not present

## 2012-12-31 ENCOUNTER — Ambulatory Visit: Payer: Medicare Other | Admitting: Family Medicine

## 2013-01-09 ENCOUNTER — Ambulatory Visit: Payer: BC Managed Care – PPO | Admitting: Family Medicine

## 2013-01-22 ENCOUNTER — Ambulatory Visit: Payer: Medicare Other | Admitting: Family Medicine

## 2013-02-05 ENCOUNTER — Encounter: Payer: Self-pay | Admitting: Family Medicine

## 2013-02-05 ENCOUNTER — Ambulatory Visit (INDEPENDENT_AMBULATORY_CARE_PROVIDER_SITE_OTHER): Payer: Medicare Other | Admitting: Family Medicine

## 2013-02-05 VITALS — BP 124/90 | HR 68 | Ht 74.0 in | Wt 153.0 lb

## 2013-02-05 DIAGNOSIS — F419 Anxiety disorder, unspecified: Secondary | ICD-10-CM

## 2013-02-05 DIAGNOSIS — F3160 Bipolar disorder, current episode mixed, unspecified: Secondary | ICD-10-CM | POA: Insufficient documentation

## 2013-02-05 DIAGNOSIS — F341 Dysthymic disorder: Secondary | ICD-10-CM

## 2013-02-05 MED ORDER — ALPRAZOLAM ER 3 MG PO TB24: ORAL_TABLET | ORAL | Status: DC

## 2013-02-05 NOTE — Patient Instructions (Addendum)
Call [phone] to talk with [facility] crisis line and they will help expedite further psych assessment, getting set up with psychiatrist and counseling, etc.

## 2013-02-05 NOTE — Assessment & Plan Note (Signed)
I emphasized to him and to his mom that this is the more pressing disorder currently and needs to be addressed by psychiatry. He has a long history of this and is on disability for it, yet he has no med for it nor does he see a Veterinary surgeon. Discussed options with them today and they are in favor of calling the [facility] crisis/help line [phone]). No med for this condition was started today.

## 2013-02-05 NOTE — Assessment & Plan Note (Signed)
With panic. I believe he has been overmedicating recently due to untreated bipolar disorder. I did increase his xanax XR 3mg  to bid dosing today: he has demonstrated that he tolerates this fine.

## 2013-02-05 NOTE — Progress Notes (Signed)
OFFICE NOTE  02/05/2013  CC:  Chief Complaint  Patient presents with  . Anxiety     HPI: Patient is a 32 y.o. Caucasian male who is here for anxiety. He last saw Dr. Abner Greenspan about 5 wks ago and was doing ok, no med changes made except he was re-instructed to start bystolic and encouraged to stop smoking. He is here for acute problem this morning and  reports feeling withdrawals from xanax XR--ran out 2 days.  Chest feels tight, anxious, a bit nauseated, insomnia, no appetite.  He took 2 of the 3mg  xr tabs on many days due to feeling excessive anxiety in evenings.  His rx should have lasted another 7d from now.   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  They recall treatment with antidepressants but don't recall any mood stabilizers.  Currently he doesn't see a psychiatrist or counselor. Denies alc abuse lately or drug abuse lately except some occ marijuana.  He admits the marijuana sometimes helps his anxiety and sometimes makes it worse. No SI or HI.  Pertinent PMH:  Past Medical History  Diagnosis Date  . Anxiety   . Depression   . ADD (attention deficit disorder)   . Insomnia   . Outbursts of anger   . Concussion     X 6- 7  . ADHD (attention deficit hyperactivity disorder) 04/02/2011  . 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 Tobacco abuse 04/28/2011  . Preventative health care 10/03/2011  . ED (erectile dysfunction) 05/12/2012    MEDS: Current xanax XR 3mg  dosing is 1 tab qAM Outpatient Prescriptions Prior to Visit  Medication Sig Dispense Refill  . amphetamine-dextroamphetamine (ADDERALL) 30 MG tablet Take 1 tablet (30 mg total) by mouth 2 (two) times daily. May 2014 rx  60 tablet  0  . Multiple Vitamin (MULTIVITAMIN) tablet Take 1 tablet by mouth daily.        Marland Kitchen VITAMIN D, CHOLECALCIFEROL, PO Take 1 capsule by mouth daily.        . nebivolol (BYSTOLIC) 5 MG tablet Take 0.5 tablets (2.5 mg total)  by mouth daily.  56 tablet  0  . ALPRAZolam (XANAX XR) 3 MG 24 hr tablet Take 1 tablet (3 mg total) by mouth every morning.  30 tablet  0   No facility-administered medications prior to visit.    PE: Blood pressure 124/90, pulse 68, height 6\' 2"  (1.88 m), weight 153 lb (69.4 kg). Gen: Alert, well appearing.  Patient is oriented to person, place, time, and situation. AFFECT: serious, concerned, fairly calm.  Lucid thought and speech.   ENT: Eyes: no injection, icteris, swelling, or exudate.  EOMI, PERRLA. Nose: no drainage or turbinate edema/swelling.  No injection or focal lesion.  Mouth: lips without lesion/swelling.  Oral mucosa pink and moist.  Oropharynx without erythema, exudate, or swelling.  CV: RRR, no m/r/g.   LUNGS: CTA bilat, nonlabored resps, good aeration in all lung fields. Neuro: no tremor.  No focal deficits.   IMPRESSION AND PLAN:  GAD (generalized anxiety disorder) With panic. I believe he has been overmedicating recently due to untreated bipolar disorder. I did increase his xanax XR 3mg  to bid dosing today: he has demonstrated that he tolerates this fine.  Bipolar disorder, mixed I emphasized to him and to his mom that this is the more pressing disorder currently and needs to be addressed by psychiatry. He has a long history of  this and is on disability for it, yet he has no med for it nor does he see a Veterinary surgeon. Discussed options with them today and they are in favor of calling the [facility] crisis/help line [phone]). No med for this condition was started today.     An After Visit Summary was printed and given to the patient.  FOLLOW UP: prn

## 2013-02-06 ENCOUNTER — Ambulatory Visit (HOSPITAL_COMMUNITY)
Admission: AD | Admit: 2013-02-06 | Discharge: 2013-02-06 | Disposition: A | Discharge status: Home / Self Care | Payer: Medicare Other | Source: Intra-hospital | Attending: Psychiatry | Admitting: Psychiatry

## 2013-02-06 ENCOUNTER — Encounter (HOSPITAL_COMMUNITY): Payer: Self-pay | Admitting: *Deleted

## 2013-02-06 ENCOUNTER — Ambulatory Visit (INDEPENDENT_AMBULATORY_CARE_PROVIDER_SITE_OTHER): Payer: Medicare Other | Admitting: Psychiatry

## 2013-02-06 DIAGNOSIS — F1011 Alcohol abuse, in remission: Secondary | ICD-10-CM

## 2013-02-06 DIAGNOSIS — F1020 Alcohol dependence, uncomplicated: Secondary | ICD-10-CM | POA: Insufficient documentation

## 2013-02-06 DIAGNOSIS — F1910 Other psychoactive substance abuse, uncomplicated: Secondary | ICD-10-CM

## 2013-02-06 DIAGNOSIS — F1610 Hallucinogen abuse, uncomplicated: Secondary | ICD-10-CM | POA: Insufficient documentation

## 2013-02-06 DIAGNOSIS — F1010 Alcohol abuse, uncomplicated: Secondary | ICD-10-CM

## 2013-02-06 HISTORY — DX: Alcohol abuse, in remission: F10.11

## 2013-02-06 MED ORDER — CITALOPRAM HYDROBROMIDE 20 MG PO TABS: 20.0000 mg | ORAL_TABLET | Freq: Every day | ORAL | Status: DC

## 2013-02-06 MED ORDER — HYDROXYZINE PAMOATE 50 MG PO CAPS: 50.0000 mg | ORAL_CAPSULE | Freq: Three times a day (TID) | ORAL | Status: DC | PRN
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
## 2013-02-06 NOTE — BH Assessment (Signed)
Assessment Note   Jesse Jones is an 32 y.o. male.here for an appt scheduled  to complete an intake assessment to participate in [facility]. He has already spoken with Charmian Muff and has a start date of Monday the 14th.for IOP. He had recently relapsed on alcohol, and marijuana. He last drank one week ago and smoked marijuana last on the 7th. He has a history of using mushrooms but has not used recently. He uses them several times a year "it was like a spiritual thing for me" He states he detoxed himself last weekend and after a very difficult detox he sought out his medical provider and he gave him Xanax to complete his detox and referred him to [facility]. He states he had been sober for 8 months as his longest period of sobriety. He had relapsed for about 2 1/2 months this most recent relapse. He had been drinking since he was 32 yo. He has been to [facility] five years ago as his only inpatient stay. He has had experience with AA. He denies psychosis, denies suicidal ideation homicidal ideation and no history of self mutilating. He has a lot of interests and is athletic. He is involved in biking, skateboarding, surfing, and skiing. His parents are supportive. He is living alone and is on disability. 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 He states he is no pain. He is in good general health but states he has a history of an irregular heart rate but is not taking medications for it. He saw Dr Donell Beers of the first time today and will start Vistaril and Celexa today when he gets his RX filled.He wants to continue to see Dr Donell Beers to work on issues involving relationships and other issues. He has a history of ADHD but states he is no longer taking Adderrall and doesn't want to resume, he feels better now without it and will just deal with his ADHD thru activity. He has separated himself from his old friends but would like to have friends as he currently has a limited support system. He  [place] [facility] [place] but lost it all due to his drinking and returned here to [facility] and that is when he participated in [facility]. Has plans to bike this weekend, and father accompanied him to his appt today.  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 Anxiety   . Depression   . ADD (attention deficit disorder)   . Insomnia   . Outbursts of anger   . Concussion     X 6- 7  . ADHD (attention deficit hyperactivity disorder) 04/02/2011  . 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 Tobacco abuse 04/28/2011  . Preventative health care 10/03/2011  . ED (erectile dysfunction) 05/12/2012   Axis IV: other psychosocial or environmental problems and problems with primary support group Axis V: 41-50 serious symptoms  Past Medical History:  Past Medical History  Diagnosis Date  . Anxiety   . Depression   . ADD (attention deficit disorder)   . Insomnia   . Outbursts of anger   . Concussion     X 6- 7  . ADHD (attention deficit hyperactivity disorder) 04/02/2011  . 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  Tobacco abuse 04/28/2011  . Preventative health care 10/03/2011  . ED (erectile dysfunction) 05/12/2012    Past Surgical History  Procedure Laterality Date  . Punctured lung    . Toe surgeries  during childhood     for ingrown toenails    Family History:  Family History  Problem Relation Age of Onset  . Depression Mother   . Anxiety disorder Mother   . Depression Brother   . Cancer Paternal Grandmother     lung/ didn't smoke  . Diabetes Brother     Social History:  reports that he has been smoking Cigarettes.  He has been smoking about 0.30 packs per day. He has never used smokeless tobacco. He reports that he drinks about 21.6 ounces of alcohol per week. He reports that he uses  illicit drugs (Marijuana and Other-see comments).  Additional Social History:  Alcohol / Drug Use Pain Medications: not abusing Prescriptions: not abusing Over the Counter: not abusing History of alcohol / drug use?: Yes Longest period of sobriety (when/how long): 8 months Substance #1 Name of Substance 1: alcohol 1 - Age of First Use: 32 yo 1 - Amount (size/oz): 36 beers 1 - Frequency: daily 1 - Duration: two and half months most recently 1 - Last Use / Amount: sat April 12th Substance #2 Name of Substance 2: Marijuana 2 - Age of First Use: 15 2 - Amount (size/oz): unknown 2 - Frequency: daily 2 - Duration: years 2 - Last Use / Amount: Monday April 6th Substance #3 Name of Substance 3: Magic Mushrooms 3 - Age of First Use: 17 3 - Amount (size/oz): unknown 3 - Frequency: 5-6 times a year 3 - Duration: years 3 - Last Use / Amount: unknown  CIWA: CIWA-Ar Nausea and Vomiting: no nausea and no vomiting Tactile Disturbances: none Tremor: no tremor Auditory Disturbances: not present Paroxysmal Sweats: no sweat visible Visual Disturbances: not present COWS: Clinical Opiate Withdrawal Scale (COWS) Sweating: No report of chills or flushing Restlessness: Able to sit still Pupil Size: Pupils pinned or normal size for room light Bone or Joint Aches: Not present Runny Nose or Tearing: Not present GI Upset: No GI symptoms Tremor: No tremor Yawning: No yawning Anxiety or Irritability: None  Allergies:  Allergies  Allergen Reactions  . Haloperidol Decanoate   . Penicillins Hives  . Valium     Home Medications:  (Not in a hospital admission)  OB/GYN Status:  No LMP for male patient.  General Assessment Data Location of Assessment: [facility] Assessment Services Living Arrangements: Alone Can pt return to current living arrangement?: Yes Admission Status: Voluntary Is patient capable of signing voluntary admission?: Yes Transfer from: Home Referral Source: MD  Education  Status Is patient currently in school?: No Current Grade: graduated high school Highest grade of school patient has completed: 12  Risk to self Suicidal Ideation: No Suicidal Intent: No Is patient at risk for suicide?: No Suicidal Plan?: No Access to Means: No What has been your use of drugs/alcohol within the last 12 months?: long history of alcohol use sober now one week Previous Attempts/Gestures: No Other Self Harm Risks: none Triggers for Past Attempts: None known Intentional Self Injurious Behavior: None Family Suicide History: No Recent stressful life event(s): Turmoil (Comment) (relational issues) Persecutory voices/beliefs?: No Depression: No Depression Symptoms:  (denies) Substance abuse history and/or treatment for substance abuse?: Yes Suicide prevention information given to non-admitted patients: Yes  Risk to Others Homicidal Ideation: No Thoughts of Harm to Others: No Current  Homicidal Intent: No Current Homicidal Plan: No Access to Homicidal Means: No History of harm to others?: No Assessment of Violence: None Noted Does patient have access to weapons?: No Criminal Charges Pending?: No Does patient have a court date: No  Psychosis Hallucinations: None noted Delusions: None noted  Mental Status Report Appear/Hygiene:  (unremarkable) Eye Contact: Good Motor Activity: Freedom of movement;Unremarkable Speech: Logical/coherent Level of Consciousness: Alert Mood: Anxious Affect: Appropriate to circumstance Anxiety Level: Minimal Thought Processes: Coherent;Relevant Judgement: Unimpaired Orientation: Person;Place;Time;Situation Obsessive Compulsive Thoughts/Behaviors: None  Cognitive Functioning Concentration: Decreased Memory: Recent Intact;Remote Intact IQ: Above Average Insight: Fair Impulse Control: Fair Appetite: Good Weight Loss: 0 Weight Gain: 0 Sleep: No Change Total Hours of Sleep: 7 Vegetative Symptoms: None  ADLScreening [facility]  Assessment Services) Patient's cognitive ability adequate to safely complete daily activities?: Yes Patient able to express need for assistance with ADLs?: Yes Independently performs ADLs?: Yes (appropriate for developmental age)  Abuse/Neglect [facility]) Physical Abuse: Denies Verbal Abuse: Denies Sexual Abuse: Denies  Prior Inpatient Therapy Prior Inpatient Therapy: Yes Prior Therapy Dates: 2010 [facility]ilty[facility]Provider[facility]): [facility] Reason for Treatment: detox  Prior Outpatient Therapy Prior Outpatient Therapy: Yes Prior Therapy Dates: current Prior Therapy Facilty/Provider(s): Dr. Donell Beers [facility] Reason for Treatment: alcohol dep, ADHD  ADL Screening (condition at time of admission) Patient's cognitive ability adequate to safely complete daily activities?: Yes Patient able to express need for assistance with ADLs?: Yes Independently performs ADLs?: Yes (appropriate for developmental age) Weakness of Legs: None Weakness of Arms/Hands: None  Home Assistive Devices/Equipment Home Assistive Devices/Equipment: None    Abuse/Neglect Assessment (Assessment to be complete while patient is alone) Physical Abuse: Denies Verbal Abuse: Denies Sexual Abuse: Denies Exploitation of patient/patient's resources: Denies Self-Neglect: Denies Values / Beliefs Cultural Requests During Hospitalization: None Spiritual Requests During Hospitalization: None   Advance Directives (For Healthcare) Advance Directive: Patient does not have advance directive Pre-existing out of facility DNR order (yellow form or pink MOST form): No Nutrition Screen- [facility] Adult/[facility]/AP Patient's home diet: Regular Have you recently lost weight without trying?: No Have you been eating poorly because of a decreased appetite?: No Malnutrition Screening Tool Score: 0  Additional Information 1:1 In Past 12 Months?: No CIRT Risk: No Elopement Risk: No Does patient have medical clearance?:  No     Disposition:  Disposition Initial Assessment Completed for this Encounter: Yes Disposition of Patient: Outpatient treatment Type of outpatient treatment: Chemical Dependence - Intensive Outpatient  On Site Evaluation by:   Reviewed with Physician:     Wynona Luna 02/06/2013 1:36 PM

## 2013-02-10 ENCOUNTER — Telehealth (HOSPITAL_COMMUNITY): Payer: Self-pay | Admitting: *Deleted

## 2013-02-10 ENCOUNTER — Emergency Department (HOSPITAL_COMMUNITY)
Admission: EM | Admit: 2013-02-10 | Discharge: 2013-02-10 | Disposition: A | Discharge status: Home / Self Care | Payer: Medicare Other | Attending: Emergency Medicine | Admitting: Emergency Medicine

## 2013-02-10 ENCOUNTER — Encounter (HOSPITAL_COMMUNITY): Payer: Self-pay | Admitting: Emergency Medicine

## 2013-02-10 DIAGNOSIS — Z87448 Personal history of other diseases of urinary system: Secondary | ICD-10-CM | POA: Insufficient documentation

## 2013-02-10 DIAGNOSIS — F1010 Alcohol abuse, uncomplicated: Secondary | ICD-10-CM

## 2013-02-10 DIAGNOSIS — Z8659 Personal history of other mental and behavioral disorders: Secondary | ICD-10-CM | POA: Insufficient documentation

## 2013-02-10 DIAGNOSIS — F1210 Cannabis abuse, uncomplicated: Secondary | ICD-10-CM | POA: Insufficient documentation

## 2013-02-10 DIAGNOSIS — R443 Hallucinations, unspecified: Secondary | ICD-10-CM | POA: Insufficient documentation

## 2013-02-10 DIAGNOSIS — F1310 Sedative, hypnotic or anxiolytic abuse, uncomplicated: Secondary | ICD-10-CM

## 2013-02-10 DIAGNOSIS — F17200 Nicotine dependence, unspecified, uncomplicated: Secondary | ICD-10-CM | POA: Insufficient documentation

## 2013-02-10 DIAGNOSIS — R61 Generalized hyperhidrosis: Secondary | ICD-10-CM | POA: Insufficient documentation

## 2013-02-10 DIAGNOSIS — Z79899 Other long term (current) drug therapy: Secondary | ICD-10-CM | POA: Insufficient documentation

## 2013-02-10 DIAGNOSIS — F341 Dysthymic disorder: Secondary | ICD-10-CM | POA: Insufficient documentation

## 2013-02-10 DIAGNOSIS — G4700 Insomnia, unspecified: Secondary | ICD-10-CM | POA: Insufficient documentation

## 2013-02-10 LAB — RAPID URINE DRUG SCREEN, HOSP PERFORMED
Barbiturates: NOT DETECTED
Benzodiazepines: NOT DETECTED

## 2013-02-10 LAB — COMPREHENSIVE METABOLIC PANEL
Albumin: 4.7 g/dL (ref 3.5–5.2)
Alkaline Phosphatase: 71 U/L (ref 39–117)
BUN: 6 mg/dL (ref 6–23)
CO2: 27 mEq/L (ref 19–32)
Chloride: 99 mEq/L (ref 96–112)
Glucose, Bld: 100 mg/dL — ABNORMAL HIGH (ref 70–99)
Potassium: 3.4 mEq/L — ABNORMAL LOW (ref 3.5–5.1)
Total Bilirubin: 0.5 mg/dL (ref 0.3–1.2)

## 2013-02-10 LAB — ETHANOL: Alcohol, Ethyl (B): <11 mg/dL (ref 0–11)

## 2013-02-10 LAB — CBC
HCT: 47.2 % (ref 39.0–52.0)
Hemoglobin: 17.1 g/dL — ABNORMAL HIGH (ref 13.0–17.0)
RBC: 5.61 MIL/uL (ref 4.22–5.81)
RDW: 13.6 % (ref 11.5–15.5)
WBC: 9.4 10*3/uL (ref 4.0–10.5)

## 2013-02-10 LAB — ACETAMINOPHEN LEVEL: Acetaminophen (Tylenol), Serum: <15 ug/mL (ref 10–30)

## 2013-02-10 MED ORDER — IBUPROFEN 600 MG PO TABS: 600.0000 mg | ORAL_TABLET | Freq: Three times a day (TID) | ORAL | Status: DC | PRN

## 2013-02-10 MED ORDER — NICOTINE 21 MG/24HR TD PT24
21.0000 mg | MEDICATED_PATCH | Freq: Every day | TRANSDERMAL | Status: DC
  Administered 2013-02-10: 21 mg via TRANSDERMAL
  Filled 2013-02-10: qty 1

## 2013-02-10 MED ORDER — ALUM & MAG HYDROXIDE-SIMETH 200-200-20 MG/5ML PO SUSP: 30.0000 mL | ORAL | Status: DC | PRN

## 2013-02-10 MED ORDER — ONDANSETRON HCL 4 MG PO TABS: 4.0000 mg | ORAL_TABLET | Freq: Three times a day (TID) | ORAL | Status: DC | PRN

## 2013-02-10 MED ORDER — VITAMIN B-12 1000 MCG PO TABS: 1000.0000 ug | ORAL_TABLET | Freq: Every day | ORAL | Status: DC

## 2013-02-10 MED ORDER — ADULT MULTIVITAMIN W/MINERALS CH: 1.0000 | ORAL_TABLET | Freq: Once | ORAL | Status: DC

## 2013-02-10 MED ORDER — ACETAMINOPHEN 325 MG PO TABS: 650.0000 mg | ORAL_TABLET | ORAL | Status: DC | PRN

## 2013-02-10 MED ORDER — HYDROXYZINE HCL 25 MG PO TABS
50.0000 mg | ORAL_TABLET | Freq: Two times a day (BID) | ORAL | Status: DC
  Administered 2013-02-10: 50 mg via ORAL
  Filled 2013-02-10: qty 2

## 2013-02-10 MED ORDER — ZOLPIDEM TARTRATE 5 MG PO TABS: 5.0000 mg | ORAL_TABLET | Freq: Every evening | ORAL | Status: DC | PRN

## 2013-02-10 MED ORDER — VITAMIN D3 25 MCG (1000 UNIT) PO TABS: 1000.0000 [IU] | ORAL_TABLET | Freq: Every day | ORAL | Status: DC

## 2013-02-10 MED ORDER — NEBIVOLOL HCL 5 MG PO TABS: 5.0000 mg | ORAL_TABLET | Freq: Every day | ORAL | Status: DC

## 2013-02-10 NOTE — ED Provider Notes (Signed)
History     CSN: [id number]  Arrival date & time 02/10/13  1316   First MD Initiated Contact with Patient 02/10/13 1404      Chief Complaint  Patient presents with  . Medical Clearance    (Consider location/radiation/quality/duration/timing/severity/associated sxs/prior treatment) HPI Comments: Patient is a 32 y/o M with PMHx of depression, anxiety, and ADHD presenting to the ED with benzodiazepine withdrawls - history of benzo and alcohol abuse. Patient stated that he has been extremely anxious, been having delirium tremens that comes in waves over the past week. Patient reported that he has was on clonazepam for numerous years regarding anxiety issue. Stated that medication was changed on Thursday to Xanax and Bystolic - reported that on Friday he was taken off Xanax and prescribed Cylexa and Hydroxyzine when he went to behavioral health. Stated that his last benzo use was Saturday morning, he took Xanax 3 mg. Patient stated that he has not drank alcohol since Saturday - usually drinks 12-24 cans of beer per day and occasionally liquor. Patient smokes approximately 1 ppd of cigarettes. Reported being easily irritated, sweating, trouble sleeping, abdominal pain, tremors intermittently, confusion, decreased concentration, hallucinations - hears echoes. Denied suicidal ideation, wanting to hurt others. Denied chest pain, shortness of breathe, difficulty breathing, gi symptoms, eye symptoms, ear symptoms, urinary symptoms.   Patient reported smoking marijuana. Stated that he did mushrooms approximately 1 month ago.   The history is provided by the patient. No language interpreter was used.    Past Medical History  Diagnosis Date  . Anxiety   . Depression   . ADD (attention deficit disorder)   . Insomnia   . Outbursts of anger   . Concussion     X 6- 7  . ADHD (attention deficit hyperactivity disorder) 04/02/2011  . 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 Tobacco abuse 04/28/2011  . Preventative health care 10/03/2011  . ED (erectile dysfunction) 05/12/2012    Past Surgical History  Procedure Laterality Date  . Punctured lung    . Toe surgeries  during childhood     for ingrown toenails    Family History  Problem Relation Age of Onset  . Depression Mother   . Anxiety disorder Mother   . Depression Brother   . Cancer Paternal Grandmother     lung/ didn't smoke  . Diabetes Brother     History  Substance Use Topics  . Smoking status: Current Every Day Smoker -- 0.30 packs/day    Types: Cigarettes  . Smokeless tobacco: Never Used  . Alcohol Use: 21.6 oz/week    36 Cans of beer per week     Comment: beer occasionally last drink 6 days ago, uses magic mushrooms 5 times a year      Review of Systems  Constitutional: Positive for diaphoresis. Negative for fever and chills.  HENT: Negative for ear pain, sore throat, trouble swallowing, neck pain and neck stiffness.   Eyes: Negative for pain and visual disturbance.  Respiratory: Negative for chest tightness and shortness of breath.   Cardiovascular: Negative for chest pain.  Gastrointestinal: Positive for abdominal pain. Negative for nausea, vomiting, diarrhea and constipation.  Skin: Negative for rash.  Neurological: Positive for tremors. Negative for dizziness, weakness, light-headedness and headaches.  Psychiatric/Behavioral: Positive for hallucinations, confusion, sleep disturbance and agitation. Negative for suicidal ideas, self-injury and dysphoric mood. The patient is nervous/anxious. The patient is not hyperactive.   All other systems reviewed and are  negative.    Allergies  Haloperidol decanoate; Penicillins; and Valium  Home Medications   Current Outpatient Rx  Name  Route  Sig  Dispense  Refill  . cholecalciferol (VITAMIN D) 1000 UNITS tablet   Oral   Take 1,000 Units by mouth every morning.         . citalopram (CELEXA) 20 MG tablet   Oral   Take 20 mg  by mouth every morning.         . hydrOXYzine (VISTARIL) 50 MG capsule   Oral   Take 50 mg by mouth 2 (two) times daily. 1 BID         . Multiple Vitamin (MULTIVITAMIN) tablet   Oral   Take 1 tablet by mouth every morning.          . nebivolol (BYSTOLIC) 5 MG tablet   Oral   Take 5 mg by mouth every morning.         . vitamin B-12 (CYANOCOBALAMIN) 1000 MCG tablet   Oral   Take 1,000 mcg by mouth every morning.           BP 110/69  Pulse 54  Temp(Src) 98 F (36.7 C) (Oral)  Resp 16  SpO2 97%  Physical Exam  Nursing note and vitals reviewed. Constitutional: He is oriented to person, place, and time. He appears well-developed and well-nourished. No distress.  HENT:  Head: Normocephalic and atraumatic.  Mouth/Throat: Oropharynx is clear and moist. No oropharyngeal exudate.  Eyes: Conjunctivae and EOM are normal. Right eye exhibits no discharge. Left eye exhibits no discharge.  Decreased pupillary reflex  Neck: Normal range of motion. Neck supple. No tracheal deviation present. No thyromegaly present.  Negative lymphadenopathy  Cardiovascular: Normal rate, regular rhythm, normal heart sounds and intact distal pulses.  Exam reveals no friction rub.   No murmur heard. Pulmonary/Chest: Effort normal and breath sounds normal. No respiratory distress. He has no wheezes. He has no rales.  Abdominal: Soft. Bowel sounds are normal. He exhibits no distension. There is no tenderness. There is no rebound and no guarding.  Lymphadenopathy:    He has no cervical adenopathy.  Neurological: He is alert and oriented to person, place, and time. No cranial nerve deficit. He exhibits normal muscle tone. Coordination normal.  Patient easily confused - unable to remember the medications he is on , the routine, exactly what he is taking.  Negative asterxis.   Skin: Skin is warm and dry. No rash noted. He is not diaphoretic. No erythema.  Psychiatric: He has a normal mood and affect. His  behavior is normal. Thought content normal.    ED Course  Procedures (including critical care time)  Labs Reviewed  CBC - Abnormal; Notable for the following:    Hemoglobin 17.1 (*)    MCHC 36.2 (*)    Platelets 411 (*)    All other components within normal limits  COMPREHENSIVE METABOLIC PANEL - Abnormal; Notable for the following:    Potassium 3.4 (*)    Glucose, Bld 100 (*)    All other components within normal limits  SALICYLATE LEVEL - Abnormal; Notable for the following:    Salicylate Lvl <2.0 (*)    All other components within normal limits  ACETAMINOPHEN LEVEL  ETHANOL  URINE RAPID DRUG SCREEN (HOSP PERFORMED)   Results for orders placed during the hospital encounter of 02/10/13  ACETAMINOPHEN LEVEL      Result Value Range   Acetaminophen (Tylenol), Serum <15.0  10 - 30  ug/mL  CBC      Result Value Range   WBC 9.4  4.0 - 10.5 K/uL   RBC 5.61  4.22 - 5.81 MIL/uL   Hemoglobin 17.1 (*) 13.0 - 17.0 g/dL   HCT 19.1  47.8 - 29.5 %   MCV 84.1  78.0 - 100.0 fL   MCH 30.5  26.0 - 34.0 pg   MCHC 36.2 (*) 30.0 - 36.0 g/dL   RDW 62.1  30.8 - 65.7 %   Platelets 411 (*) 150 - 400 K/uL  COMPREHENSIVE METABOLIC PANEL      Result Value Range   Sodium 137  135 - 145 mEq/L   Potassium 3.4 (*) 3.5 - 5.1 mEq/L   Chloride 99  96 - 112 mEq/L   CO2 27  19 - 32 mEq/L   Glucose, Bld 100 (*) 70 - 99 mg/dL   BUN 6  6 - 23 mg/dL   Creatinine, Ser 8.46  0.50 - 1.35 mg/dL   Calcium 9.8  8.4 - 96.2 mg/dL   Total Protein 8.1  6.0 - 8.3 g/dL   Albumin 4.7  3.5 - 5.2 g/dL   AST 19  0 - 37 U/L   ALT 16  0 - 53 U/L   Alkaline Phosphatase 71  39 - 117 U/L   Total Bilirubin 0.5  0.3 - 1.2 mg/dL   GFR calc non Af Amer >90  >90 mL/min   GFR calc Af Amer >90  >90 mL/min  ETHANOL      Result Value Range   Alcohol, Ethyl (B) <11  0 - 11 mg/dL  SALICYLATE LEVEL      Result Value Range   Salicylate Lvl <2.0 (*) 2.8 - 20.0 mg/dL  URINE RAPID DRUG SCREEN (HOSP PERFORMED)      Result Value  Range   Opiates NONE DETECTED  NONE DETECTED   Cocaine NONE DETECTED  NONE DETECTED   Benzodiazepines NONE DETECTED  NONE DETECTED   Amphetamines NONE DETECTED  NONE DETECTED   Tetrahydrocannabinol NONE DETECTED  NONE DETECTED   Barbiturates NONE DETECTED  NONE DETECTED   No results found.     1. Benzodiazepine withdrawal   2. Benzodiazepine abuse   3. Alcohol abuse   4. Alcohol withdrawal       MDM  Patient is afebrile, normotensive, non-tachycardic, alert and oriented. I personally evaluated and examined the patient. Patient presented to ED with mother. Patient mildly confused with medications, exact routine. Patient reported increased anxiety, delirium tremens, sweating, poor sleeping condition, hallucinations. Denied suicidal ideation, wanting to hurt others. ACT consult performed. Patient medically cleared. Patient brought to psych ED. Telepsych ordered.        Raymon Mutton, PA-C 02/10/13 1713 Discussed case with Dr. Dorris Carnes. Ankit - psych ED physician.  Raymon Mutton, PA-C 02/10/13 2042

## 2013-02-10 NOTE — ED Provider Notes (Signed)
Patient signed out to me. Comes in with benzo and alcolhul abuse, requesting detox.  No medical concerns.  Workup thus far is negative, utox + for benzo  Telepsych recs are to d/c patient and have outpatient resources.  Vitals, labs and hx, exam not suggestive of acute withdrawals.  Will d.c.   Filed Vitals:   02/10/13 1751  BP: 103/69  Pulse: 64  Temp: 98 F (36.7 C)  Resp: 18     Derwood Kaplan, MD 02/10/13 2111

## 2013-02-10 NOTE — Telephone Encounter (Signed)
Pt left VM 02/10/13 @ 1029:Saw MD, unsure of name - Dr.Shulz?-last Thursday.Was given Celexa and Hydroxyzine.Been having increased anxiety last week, worse last 3 days.Hydroxyzine not really helping.Needs something else, since he is detoxing from Xanax. Informed Charmian Muff LCAS and Jorje Guild, PA of pt call as per Dr.PLOVSKY's notes from 02/05/13 pt is to begin [facility] program. A.Watt, PA and Ms Logan Bores asked that pt be called and strongly urged to go to ED if dtoxing from Xanax.Asked that pt be informed that seizures could occur that might be fatal. Contacted pt 02/10/13 @ 12:13:Asked pt when last Xanax was taken. Pt stated it was three days ago. Pt states he is jittery and feels as though he cannot be still. Denies nausea or headache. Gave patient instructions as per Ms.Evans and A.Watt, PA to go to ED.Patient asked if he could come to Inpatient Unit at [facility].Informed pt that if he presented at [facility] for assessment, they will send him to ED as last Xanax was 3 days ago.Pt stated he understood recommendation to go to ED. Ms.Evans and A.Watt, PA given information from call.

## 2013-02-10 NOTE — ED Notes (Signed)
Patient denies SI and HI at discharge 

## 2013-02-10 NOTE — ED Notes (Signed)
States that he wants detox from alcohol and from benzos. States that he was taken off Xanax by his doctor and placed on hydroxyzine and cymbalta. States that he feels as though he has already dextoxed from the xanax 3mg  due to tremors, decreased appetite, and delusions.

## 2013-02-12 NOTE — ED Provider Notes (Signed)
Medical screening examination/treatment/procedure(s) were performed by non-physician practitioner and as supervising physician I was immediately available for consultation/collaboration. Lashell Moffitt, MD, FACEP   Supreme Rybarczyk L Rosina Cressler, MD 02/12/13 1915 

## 2013-02-17 ENCOUNTER — Ambulatory Visit (INDEPENDENT_AMBULATORY_CARE_PROVIDER_SITE_OTHER): Payer: Medicare Other | Admitting: Family Medicine

## 2013-02-17 ENCOUNTER — Encounter: Payer: Self-pay | Admitting: Family Medicine

## 2013-02-17 VITALS — BP 115/79 | HR 80 | Temp 98.0°F | Resp 16 | Wt 154.8 lb

## 2013-02-17 DIAGNOSIS — F909 Attention-deficit hyperactivity disorder, unspecified type: Secondary | ICD-10-CM

## 2013-02-17 DIAGNOSIS — F411 Generalized anxiety disorder: Secondary | ICD-10-CM

## 2013-02-17 DIAGNOSIS — F1020 Alcohol dependence, uncomplicated: Secondary | ICD-10-CM

## 2013-02-17 MED ORDER — CITALOPRAM HYDROBROMIDE 40 MG PO TABS: 40.0000 mg | ORAL_TABLET | Freq: Every day | ORAL | Status: DC

## 2013-02-17 MED ORDER — METHYLPHENIDATE 20 MG/9HR TD PTCH: 1.0000 | MEDICATED_PATCH | Freq: Every day | TRANSDERMAL | Status: DC

## 2013-02-17 NOTE — Assessment & Plan Note (Signed)
Encouraged pt to attend at least a weekly AA meeting, which he plans on doing.

## 2013-02-17 NOTE — Assessment & Plan Note (Signed)
Increase citalopram to 30mg  (1 and 1/2 of the 20mg  tabs he currently has) qd and when he is out of these 20mg  pills he'll start a 40mg  qd citalopram dose. No more benzos.  I emphasized this repeatedly to patient today.  I advised him to throw away the xanax xr her currently has. Referral for counseling made today.  He does not want to go to a psychiatrist at this time, currently says he is comfortable with me managing his meds.

## 2013-02-17 NOTE — Progress Notes (Signed)
OFFICE NOTE  02/17/2013  CC:  Chief Complaint  Patient presents with  . Follow-up    Medication Management  . Establish Care    Transfer PCP [from Dr Abner Greenspan  . Medication Problem    Pt to discuss restart of Adderall or alternative RX; did medication detox [2] wks ago.     HPI: Patient is a 32 y.o. Caucasian male who is here for f/u psych issues. Since I last saw him he has gone on a drinking binge and had to go through detox in [facility]. Prior to this, he did go to outpt psychiatrist eval at [facility] and was actually thought to have anxiety and addiction problems as dx but not a mood disorder. My prescribing of xanax for him when I last saw him was seen as an unfortunate thing b/c of his potential to abuse it PLUS potential for dangerous interaction with alcohol.  Therefore, he was advised to stop this med and start citalopram 20mg  qd.  He went into detox before he could f/u again with that psychiatrist.  He had been given vistaril as a med for use for acute anxiety and when he used this recently he had a paradoxic response to it. Now, since getting out of detox, he has restarted his xanax 3mg , 1 qd--for the last 2d.  He says he feels better and is not drinking.  Pertinent PMH:  Past Medical History  Diagnosis Date  . Anxiety   . Depression   . ADD (attention deficit disorder)   . Insomnia   . Outbursts of anger   . Concussion     X 6- 7  . ADHD (attention deficit hyperactivity disorder) 04/02/2011  . 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 Tobacco abuse 04/28/2011  . Preventative health care 10/03/2011  . ED (erectile dysfunction) 05/12/2012    MEDS:  Outpatient Prescriptions Prior to Visit  Medication Sig Dispense Refill  . cholecalciferol (VITAMIN D) 1000 UNITS tablet Take 1,000 Units by mouth every morning.      . Multiple Vitamin (MULTIVITAMIN) tablet Take 1 tablet by mouth every morning.       . nebivolol (BYSTOLIC) 5 MG tablet Take 5 mg by mouth  every morning.      . vitamin B-12 (CYANOCOBALAMIN) 1000 MCG tablet Take 1,000 mcg by mouth every morning.      . citalopram (CELEXA) 20 MG tablet Take 20 mg by mouth every morning.      . hydrOXYzine (VISTARIL) 50 MG capsule Take 50 mg by mouth 2 (two) times daily. 1 BID       No facility-administered medications prior to visit.    PE: Blood pressure 115/79, pulse 80, temperature 98 F (36.7 C), temperature source Oral, resp. rate 16, weight 154 lb 12 oz (70.194 kg), SpO2 99.00%. Gen: Alert, well appearing.  Patient is oriented to person, place, time, and situation. AFFECT: pleasant, lucid thought and speech. ENT: no icterus.  Oral mucosa pink, moist. CV: RRR, no m/r/g.   LUNGS: CTA bilat, nonlabored resps, good aeration in all lung fields. Neuro: no tremor, no ataxia.  LABS:  Lab Results  Component Value Date   WBC 9.4 02/10/2013   HGB 17.1* 02/10/2013   HCT 47.2 02/10/2013   MCV 84.1 02/10/2013   PLT 411* 02/10/2013     Chemistry      Component Value Date/Time   NA 137 02/10/2013 1350   K 3.4* 02/10/2013 1350   CL 99 02/10/2013 1350  CO2 27 02/10/2013 1350   BUN 6 02/10/2013 1350   CREATININE 0.85 02/10/2013 1350      Component Value Date/Time   CALCIUM 9.8 02/10/2013 1350   ALKPHOS 71 02/10/2013 1350   AST 19 02/10/2013 1350   ALT 16 02/10/2013 1350   BILITOT 0.5 02/10/2013 1350     Lab Results  Component Value Date   TSH 2.26 07/07/2012     IMPRESSION AND PLAN:  Alcoholism Encouraged pt to attend at least a weekly AA meeting, which he plans on doing.   ADHD (attention deficit hyperactivity disorder) Daytrana 20mg  qd trial.  Therapeutic expectations and side effect profile of medication discussed today.  Patient's questions answered.   GAD (generalized anxiety disorder) Increase citalopram to 30mg  (1 and 1/2 of the 20mg  tabs he currently has) qd and when he is out of these 20mg  pills he'll start a 40mg  qd citalopram dose. No more benzos.  I emphasized this repeatedly  to patient today.  I advised him to throw away the xanax xr her currently has. Referral for counseling made today.  He does not want to go to a psychiatrist at this time, currently says he is comfortable with me managing his meds.   An After Visit Summary was printed and given to the patient.  Spent 30 min with pt today, with >50% of this time spent in counseling and care coordination regarding the above problems.  FOLLOW UP: 2 wks

## 2013-02-17 NOTE — Assessment & Plan Note (Signed)
Daytrana 20mg  qd trial.  Therapeutic expectations and side effect profile of medication discussed today.  Patient's questions answered.

## 2013-02-24 ENCOUNTER — Ambulatory Visit (INDEPENDENT_AMBULATORY_CARE_PROVIDER_SITE_OTHER): Payer: Medicare Other | Admitting: Family Medicine

## 2013-02-24 ENCOUNTER — Encounter: Payer: Self-pay | Admitting: Family Medicine

## 2013-02-24 VITALS — BP 118/86 | HR 79 | Temp 98.3°F | Ht 74.0 in | Wt 162.0 lb

## 2013-02-24 DIAGNOSIS — F411 Generalized anxiety disorder: Secondary | ICD-10-CM

## 2013-02-24 DIAGNOSIS — F909 Attention-deficit hyperactivity disorder, unspecified type: Secondary | ICD-10-CM

## 2013-02-24 DIAGNOSIS — F17200 Nicotine dependence, unspecified, uncomplicated: Secondary | ICD-10-CM

## 2013-02-24 DIAGNOSIS — Z720 Tobacco use: Secondary | ICD-10-CM

## 2013-02-24 DIAGNOSIS — F1020 Alcohol dependence, uncomplicated: Secondary | ICD-10-CM

## 2013-02-24 MED ORDER — NEBIVOLOL HCL 5 MG PO TABS: 5.0000 mg | ORAL_TABLET | Freq: Every morning | ORAL | Status: DC

## 2013-02-24 MED ORDER — AMPHETAMINE-DEXTROAMPHETAMINE 30 MG PO TABS: 30.0000 mg | ORAL_TABLET | Freq: Two times a day (BID) | ORAL | Status: DC

## 2013-02-26 ENCOUNTER — Encounter: Payer: Self-pay | Admitting: Family Medicine

## 2013-02-26 NOTE — Assessment & Plan Note (Signed)
Back on Citalopram and tolerating it well In past was just frustrated with ED, may continue Alprazolam XR as well

## 2013-02-26 NOTE — Progress Notes (Signed)
Patient ID: Jesse Jones, male   DOB: 05-29-1981, 32 y.o.   MRN: [id number] ZAHARI XIANG [id number] 1980/11/06 02/26/2013      Progress Note-Follow Up  Subjective  Chief Complaint  Chief Complaint  Patient presents with  . Follow-up    HPI  Patient is a 32 year old Caucasian male who is in today for followup. He recently presented to the hospital during attempts to wean himself off alcohol. At this point in reports being nearly alcohol free but not completely clear. He was struggling with palpitations, anxiety, diaphoresis and nausea while he was decreasing his alcohol but feels well now. He is started on citalopram and struggling less with anxiety. No chest pain, palpitations, shortness of breath, GI or GU concerns today.  Past Medical History  Diagnosis Date  . Anxiety   . Depression   . ADD (attention deficit disorder)   . Insomnia   . Outbursts of anger   . Concussion     X 6- 7  . ADHD (attention deficit hyperactivity disorder) 04/02/2011  . 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 Tobacco abuse 04/28/2011  . Preventative health care 10/03/2011  . ED (erectile dysfunction) 05/12/2012    Past Surgical History  Procedure Laterality Date  . Punctured lung    . Toe surgeries  during childhood     for ingrown toenails    Family History  Problem Relation Age of Onset  . Depression Mother   . Anxiety disorder Mother   . Depression Brother   . Cancer Paternal Grandmother     lung/ didn't smoke  . Diabetes Brother     History   Social History  . Marital Status: Single    Spouse Name: N/A    Number of Children: N/A  . Years of Education: N/A   Occupational History  . Not on file.   Social History Main Topics  . Smoking status: Current Every Day Smoker -- 0.30 packs/day    Types: Cigarettes  . Smokeless tobacco: Never Used  . Alcohol Use: 21.6 oz/week    36 Cans of beer per week     Comment: beer occasionally last drink  6 days ago, uses magic mushrooms 5 times a year  . Drug Use: Yes    Special: Marijuana, Other-see comments  . Sexually Active: Yes -- Male partner(s)   Other Topics Concern  . Not on file   Social History Narrative  . No narrative on file    Current Outpatient Prescriptions on File Prior to Visit  Medication Sig Dispense Refill  . cholecalciferol (VITAMIN D) 1000 UNITS tablet Take 1,000 Units by mouth every morning.      . Multiple Vitamin (MULTIVITAMIN) tablet Take 1 tablet by mouth every morning.       . vitamin B-12 (CYANOCOBALAMIN) 1000 MCG tablet Take 1,000 mcg by mouth every morning.       No current facility-administered medications on file prior to visit.    Allergies  Allergen Reactions  . Haloperidol Decanoate   . Hydroxyzine Anxiety    EXCESSIVE ANXIOUNESS  . Penicillins Hives  . Valium     Review of Systems  Review of Systems  Constitutional: Negative for fever and malaise/fatigue.  HENT: Negative for congestion.   Eyes: Negative for discharge.  Respiratory: Negative for shortness of breath.   Cardiovascular: Negative for chest pain, palpitations and leg swelling.  Gastrointestinal: Negative for nausea, abdominal pain and diarrhea.  Genitourinary: Negative for dysuria.  Musculoskeletal: Negative for falls.  Skin: Negative for rash.  Neurological: Negative for loss of consciousness and headaches.  Endo/Heme/Allergies: Negative for polydipsia.  Psychiatric/Behavioral: Negative for depression and suicidal ideas. The patient is nervous/anxious. The patient does not have insomnia.     Objective  BP 118/86  Pulse 79  Temp(Src) 98.3 F (36.8 C) (Oral)  Ht 6\' 2"  (1.88 m)  Wt 162 lb 0.6 oz (73.501 kg)  BMI 20.8 kg/m2  SpO2 99%  Physical Exam  Physical Exam  Constitutional: He is oriented to person, place, and time and well-developed, well-nourished, and in no distress. No distress.  HENT:  Head: Normocephalic and atraumatic.  Eyes: Conjunctivae are  normal.  Neck: Neck supple. No thyromegaly present.  Cardiovascular: Normal rate, regular rhythm and normal heart sounds.   No murmur heard. Pulmonary/Chest: Effort normal and breath sounds normal. No respiratory distress.  Abdominal: He exhibits no distension and no mass. There is no tenderness.  Musculoskeletal: He exhibits no edema.  Neurological: He is alert and oriented to person, place, and time.  Skin: Skin is warm.  Psychiatric: Memory, affect and judgment normal.    Lab Results  Component Value Date   TSH 2.26 07/07/2012   Lab Results  Component Value Date   WBC 9.4 02/10/2013   HGB 17.1* 02/10/2013   HCT 47.2 02/10/2013   MCV 84.1 02/10/2013   PLT 411* 02/10/2013   Lab Results  Component Value Date   CREATININE 0.85 02/10/2013   BUN 6 02/10/2013   NA 137 02/10/2013   K 3.4* 02/10/2013   CL 99 02/10/2013   CO2 27 02/10/2013   Lab Results  Component Value Date   ALT 16 02/10/2013   AST 19 02/10/2013   ALKPHOS 71 02/10/2013   BILITOT 0.5 02/10/2013     Assessment & Plan  ADHD (attention deficit hyperactivity disorder) Continue Adderall 30 mg po bid  Alcoholism Encouraged ongoing complete cessation  Tobacco abuse Encouraged cessation  GAD (generalized anxiety disorder) Back on Citalopram and tolerating it well In past was just frustrated with ED, may continue Alprazolam XR as well

## 2013-02-26 NOTE — Assessment & Plan Note (Signed)
Continue Adderall 30 mg po bid

## 2013-02-26 NOTE — Assessment & Plan Note (Signed)
Encouraged cessation.

## 2013-02-26 NOTE — Assessment & Plan Note (Signed)
Encouraged ongoing complete cessation

## 2013-03-06 ENCOUNTER — Ambulatory Visit (HOSPITAL_COMMUNITY): Payer: Medicare Other | Admitting: Psychiatry

## 2013-03-11 ENCOUNTER — Ambulatory Visit (HOSPITAL_COMMUNITY): Payer: Self-pay | Admitting: Psychiatry

## 2013-03-26 ENCOUNTER — Encounter: Payer: Self-pay | Admitting: Family Medicine

## 2013-03-26 ENCOUNTER — Ambulatory Visit (INDEPENDENT_AMBULATORY_CARE_PROVIDER_SITE_OTHER): Payer: Medicare Other | Admitting: Family Medicine

## 2013-03-26 VITALS — BP 110/80 | HR 85 | Temp 98.1°F | Ht 74.0 in | Wt 156.0 lb

## 2013-03-26 DIAGNOSIS — F909 Attention-deficit hyperactivity disorder, unspecified type: Secondary | ICD-10-CM

## 2013-03-26 DIAGNOSIS — F3289 Other specified depressive episodes: Secondary | ICD-10-CM

## 2013-03-26 DIAGNOSIS — F411 Generalized anxiety disorder: Secondary | ICD-10-CM

## 2013-03-26 DIAGNOSIS — F1010 Alcohol abuse, uncomplicated: Secondary | ICD-10-CM

## 2013-03-26 DIAGNOSIS — F329 Major depressive disorder, single episode, unspecified: Secondary | ICD-10-CM

## 2013-03-26 MED ORDER — ALPRAZOLAM ER 3 MG PO TB24: 3.0000 mg | ORAL_TABLET | ORAL | Status: DC

## 2013-03-26 MED ORDER — AMPHETAMINE-DEXTROAMPHETAMINE 30 MG PO TABS: 30.0000 mg | ORAL_TABLET | Freq: Two times a day (BID) | ORAL | Status: DC

## 2013-03-26 MED ORDER — CITALOPRAM HYDROBROMIDE 40 MG PO TABS: 40.0000 mg | ORAL_TABLET | Freq: Every day | ORAL | Status: DC

## 2013-03-26 NOTE — Patient Instructions (Signed)
Palpitations  A palpitation is the feeling that your heartbeat is irregular or is faster than normal. It may feel like your heart is fluttering or skipping a beat. Palpitations are usually not a serious problem. However, in some cases, you may need further medical evaluation. CAUSES  Palpitations can be caused by:  Smoking.  Caffeine or other stimulants, such as diet pills or energy drinks.  Alcohol.  Stress and anxiety.  Strenuous physical activity.  Fatigue.  Certain medicines.  Heart disease, especially if you have a history of arrhythmias. This includes atrial fibrillation, atrial flutter, or supraventricular tachycardia.  An improperly working pacemaker or defibrillator. DIAGNOSIS  To find the cause of your palpitations, your caregiver will take your history and perform a physical exam. Tests may also be done, including:  Electrocardiography (ECG). This test records the heart's electrical activity.  Cardiac monitoring. This allows your caregiver to monitor your heart rate and rhythm in real time.  Holter monitor. This is a portable device that records your heartbeat and can help diagnose heart arrhythmias. It allows your caregiver to track your heart activity for several days, if needed.  Stress tests by exercise or by giving medicine that makes the heart beat faster. TREATMENT  Treatment of palpitations depends on the cause of your symptoms and can vary greatly. Most cases of palpitations do not require any treatment other than time, relaxation, and monitoring your symptoms. Other causes, such as atrial fibrillation, atrial flutter, or supraventricular tachycardia, usually require further treatment. HOME CARE INSTRUCTIONS   Avoid:  Caffeinated coffee, tea, soft drinks, diet pills, and energy drinks.  Chocolate.  Alcohol.  Stop smoking if you smoke.  Reduce your stress and anxiety. Things that can help you relax include:  A method that measures bodily functions so  you can learn to control them (biofeedback).  Yoga.  Meditation.  Physical activity such as swimming, jogging, or walking.  Get plenty of rest and sleep. SEEK MEDICAL CARE IF:   You continue to have a fast or irregular heartbeat beyond 24 hours.  Your palpitations occur more often. SEEK IMMEDIATE MEDICAL CARE IF:  You develop chest pain or shortness of breath.  You have a severe headache.  You feel dizzy, or you faint. MAKE SURE YOU:  Understand these instructions.  Will watch your condition.  Will get help right away if you are not doing well or get worse. Document Released: 10/12/2000 Document Revised: 04/15/2012 Document Reviewed: 12/14/2011 [employer] Patient Information 2014 [employer].  

## 2013-03-29 NOTE — Assessment & Plan Note (Signed)
Citalopram and Alprazolam prn are helpful.

## 2013-03-29 NOTE — Progress Notes (Signed)
Patient ID: Jesse Jones, male   DOB: 1980/10/31, 32 y.o.   MRN: [id number] Jesse Jones [id number] 14-Jun-1981 03/29/2013      Progress Note-Follow Up  Subjective  Chief Complaint  Chief Complaint  Patient presents with  . Follow-up    on medication    HPI  Patient is a 32 year old Caucasian male who is in today in followup. He has titrated up to citalopram to 40 mg and feels much better. He denies any manic episodes but instead has had increasing his mood. He is having less trouble fatigue and trouble concentrating. He denies any GI or GU concerns. No shortness of breath. Is taking medications as prescribed  Past Medical History  Diagnosis Date  . Anxiety   . Depression   . ADD (attention deficit disorder)   . Insomnia   . Outbursts of anger   . Concussion     X 6- 7  . ADHD (attention deficit hyperactivity disorder) 04/02/2011  . 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 Tobacco abuse 04/28/2011  . Preventative health care 10/03/2011  . ED (erectile dysfunction) 05/12/2012    Past Surgical History  Procedure Laterality Date  . Punctured lung    . Toe surgeries  during childhood     for ingrown toenails    Family History  Problem Relation Age of Onset  . Depression Mother   . Anxiety disorder Mother   . Depression Brother   . Cancer Paternal Grandmother     lung/ didn't smoke  . Diabetes Brother     History   Social History  . Marital Status: Single    Spouse Name: N/A    Number of Children: N/A  . Years of Education: N/A   Occupational History  . Not on file.   Social History Main Topics  . Smoking status: Current Every Day Smoker -- 0.30 packs/day    Types: Cigarettes  . Smokeless tobacco: Never Used  . Alcohol Use: 21.6 oz/week    36 Cans of beer per week     Comment: beer occasionally last drink 6 days ago, uses magic mushrooms 5 times a year- stopped drinking 1.5 months ago  . Drug Use: Yes    Special:  Marijuana, Other-see comments  . Sexually Active: Yes -- Male partner(s)   Other Topics Concern  . Not on file   Social History Narrative  . No narrative on file    Current Outpatient Prescriptions on File Prior to Visit  Medication Sig Dispense Refill  . cholecalciferol (VITAMIN D) 1000 UNITS tablet Take 1,000 Units by mouth every morning.      . fish oil-omega-3 fatty acids 1000 MG capsule Take 2 g by mouth daily.      . Multiple Vitamin (MULTIVITAMIN) tablet Take 1 tablet by mouth every morning.       . nebivolol (BYSTOLIC) 5 MG tablet Take 1 tablet (5 mg total) by mouth every morning.  30 tablet  5  . vitamin B-12 (CYANOCOBALAMIN) 1000 MCG tablet Take 1,000 mcg by mouth every morning.      . vitamin C (ASCORBIC ACID) 500 MG tablet Take 500 mg by mouth daily.       No current facility-administered medications on file prior to visit.    Allergies  Allergen Reactions  . Haloperidol Decanoate   . Hydroxyzine Anxiety    EXCESSIVE ANXIOUNESS  . Penicillins Hives  . Valium     Review of Systems  Review of Systems  Constitutional: Positive for malaise/fatigue. Negative for fever.  HENT: Negative for congestion.   Eyes: Negative for discharge.  Respiratory: Negative for shortness of breath.   Cardiovascular: Negative for chest pain, palpitations and leg swelling.  Gastrointestinal: Negative for nausea, abdominal pain and diarrhea.  Genitourinary: Negative for dysuria.  Musculoskeletal: Negative for falls.  Skin: Negative for rash.  Neurological: Negative for loss of consciousness and headaches.  Endo/Heme/Allergies: Negative for polydipsia.  Psychiatric/Behavioral: Negative for depression and suicidal ideas. The patient is nervous/anxious. The patient does not have insomnia.     Objective  BP 110/80  Pulse 85  Temp(Src) 98.1 F (36.7 C) (Oral)  Ht 6\' 2"  (1.88 m)  Wt 156 lb (70.761 kg)  BMI 20.02 kg/m2  SpO2 97%  Physical Exam  Physical Exam  Constitutional:  He is oriented to person, place, and time and well-developed, well-nourished, and in no distress. No distress.  HENT:  Head: Normocephalic and atraumatic.  Eyes: Conjunctivae are normal.  Neck: Neck supple. No thyromegaly present.  Cardiovascular: Normal rate, regular rhythm and normal heart sounds.   No murmur heard. Pulmonary/Chest: Effort normal and breath sounds normal. No respiratory distress.  Abdominal: He exhibits no distension and no mass. There is no tenderness.  Musculoskeletal: He exhibits no edema.  Neurological: He is alert and oriented to person, place, and time.  Skin: Skin is warm.  Psychiatric: Memory, affect and judgment normal.    Lab Results  Component Value Date   TSH 2.26 07/07/2012   Lab Results  Component Value Date   WBC 9.4 02/10/2013   HGB 17.1* 02/10/2013   HCT 47.2 02/10/2013   MCV 84.1 02/10/2013   PLT 411* 02/10/2013   Lab Results  Component Value Date   CREATININE 0.85 02/10/2013   BUN 6 02/10/2013   NA 137 02/10/2013   K 3.4* 02/10/2013   CL 99 02/10/2013   CO2 27 02/10/2013   Lab Results  Component Value Date   ALT 16 02/10/2013   AST 19 02/10/2013   ALKPHOS 71 02/10/2013   BILITOT 0.5 02/10/2013   Assessment & Plan  Alcohol abuse No alcohol for 45 days  ADHD (attention deficit hyperactivity disorder) Given refills on meds to sue as directed.   GAD (generalized anxiety disorder) Citalopram and Alprazolam prn are helpful.

## 2013-03-29 NOTE — Assessment & Plan Note (Signed)
No alcohol for 45 days

## 2013-03-29 NOTE — Assessment & Plan Note (Signed)
Given refills on meds to sue as directed.

## 2013-06-22 ENCOUNTER — Telehealth: Payer: Self-pay | Admitting: Family Medicine

## 2013-06-22 DIAGNOSIS — F909 Attention-deficit hyperactivity disorder, unspecified type: Secondary | ICD-10-CM

## 2013-06-22 NOTE — Telephone Encounter (Signed)
He can have a one month refill on ADD meds it should be about right for refills

## 2013-06-22 NOTE — Telephone Encounter (Signed)
Patient is requesting a new prescription of adderall. He states that he will run out on 8/28 and will not have enough to last him until his appointment on 06/30/13

## 2013-06-22 NOTE — Telephone Encounter (Signed)
Please advise refill? Pt was given rx on 03-26-13 for Aug 2014 RX.

## 2013-06-23 MED ORDER — AMPHETAMINE-DEXTROAMPHETAMINE 30 MG PO TABS: 30.0000 mg | ORAL_TABLET | Freq: Two times a day (BID) | ORAL | Status: DC

## 2013-06-23 NOTE — Telephone Encounter (Signed)
Left a detailed message on vm stating RX is ready to be picked up.  RX at front desk

## 2013-06-30 ENCOUNTER — Ambulatory Visit: Payer: Self-pay | Admitting: Family Medicine

## 2013-07-02 ENCOUNTER — Ambulatory Visit (INDEPENDENT_AMBULATORY_CARE_PROVIDER_SITE_OTHER): Payer: Medicare Other | Admitting: Family Medicine

## 2013-07-02 ENCOUNTER — Encounter: Payer: Self-pay | Admitting: Family Medicine

## 2013-07-02 VITALS — BP 132/78 | HR 98 | Temp 98.3°F | Ht 74.0 in | Wt 157.0 lb

## 2013-07-02 DIAGNOSIS — F1010 Alcohol abuse, uncomplicated: Secondary | ICD-10-CM

## 2013-07-02 DIAGNOSIS — F17200 Nicotine dependence, unspecified, uncomplicated: Secondary | ICD-10-CM

## 2013-07-02 DIAGNOSIS — R000 Tachycardia, unspecified: Secondary | ICD-10-CM

## 2013-07-02 DIAGNOSIS — F909 Attention-deficit hyperactivity disorder, unspecified type: Secondary | ICD-10-CM

## 2013-07-02 DIAGNOSIS — F411 Generalized anxiety disorder: Secondary | ICD-10-CM

## 2013-07-02 DIAGNOSIS — Z720 Tobacco use: Secondary | ICD-10-CM

## 2013-07-02 MED ORDER — ALPRAZOLAM ER 3 MG PO TB24: 3.0000 mg | ORAL_TABLET | ORAL | Status: DC

## 2013-07-02 MED ORDER — AMPHETAMINE-DEXTROAMPHETAMINE 20 MG PO TABS: 20.0000 mg | ORAL_TABLET | Freq: Two times a day (BID) | ORAL | Status: DC

## 2013-07-02 MED ORDER — NEBIVOLOL HCL 5 MG PO TABS: 5.0000 mg | ORAL_TABLET | Freq: Every morning | ORAL | Status: DC

## 2013-07-02 MED ORDER — AMPHETAMINE-DEXTROAMPHET ER 30 MG PO CP24: 30.0000 mg | ORAL_CAPSULE | Freq: Two times a day (BID) | ORAL | Status: DC

## 2013-07-02 MED ORDER — AMPHETAMINE-DEXTROAMPHETAMINE 30 MG PO TABS: 30.0000 mg | ORAL_TABLET | Freq: Two times a day (BID) | ORAL | Status: DC

## 2013-07-02 NOTE — Progress Notes (Signed)
Patient ID: Jesse Jones, male   DOB: 1981-09-10, 32 y.o.   MRN: [id number] Jesse Jones [id number] Jan 07, 1981 07/02/2013      Progress Note-Follow Up  Subjective  Chief Complaint  Chief Complaint  Patient presents with  . Follow-up    HPI  Patient is a 32 year old Caucasian male with a followup he is feeling well. He has not drunk any alcohol in over 6 or 7 months. He feels well. He continues to struggle with anxiety but feels this is improved with his citalopram. Depression is improved as well. He has been exercising more. No. No chest pain, palpitations, shortness of breath, GI or GU concerns noted today.  Past Medical History  Diagnosis Date  . Anxiety   . Depression   . ADD (attention deficit disorder)   . Insomnia   . Outbursts of anger   . Concussion     X 6- 7  . ADHD (attention deficit hyperactivity disorder) 04/02/2011  . 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 Tobacco abuse 04/28/2011  . Preventative health care 10/03/2011  . ED (erectile dysfunction) 05/12/2012    Past Surgical History  Procedure Laterality Date  . Punctured lung    . Toe surgeries  during childhood     for ingrown toenails    Family History  Problem Relation Age of Onset  . Depression Mother   . Anxiety disorder Mother   . Depression Brother   . Cancer Paternal Grandmother     lung/ didn't smoke  . Diabetes Brother     History   Social History  . Marital Status: Single    Spouse Name: N/A    Number of Children: N/A  . Years of Education: N/A   Occupational History  . Not on file.   Social History Main Topics  . Smoking status: Current Every Day Smoker -- 0.30 packs/day    Types: Cigarettes  . Smokeless tobacco: Never Used  . Alcohol Use: 21.6 oz/week    36 Cans of beer per week     Comment: beer occasionally last drink 6 days ago, uses magic mushrooms 5 times a year- stopped drinking 1.5 months ago  . Drug Use: Yes    Special:  Marijuana, Other-see comments  . Sexual Activity: Yes    Partners: Female   Other Topics Concern  . Not on file   Social History Narrative  . No narrative on file    Current Outpatient Prescriptions on File Prior to Visit  Medication Sig Dispense Refill  . cholecalciferol (VITAMIN D) 1000 UNITS tablet Take 1,000 Units by mouth every morning.      . fish oil-omega-3 fatty acids 1000 MG capsule Take 2 g by mouth daily.      . Multiple Vitamin (MULTIVITAMIN) tablet Take 1 tablet by mouth every morning.       . vitamin B-12 (CYANOCOBALAMIN) 1000 MCG tablet Take 1,000 mcg by mouth every morning.      . vitamin C (ASCORBIC ACID) 500 MG tablet Take 500 mg by mouth daily.       No current facility-administered medications on file prior to visit.    Allergies  Allergen Reactions  . Haloperidol Decanoate   . Hydroxyzine Anxiety    EXCESSIVE ANXIOUNESS  . Penicillins Hives  . Valium     Review of Systems  Review of Systems  Constitutional: Negative for fever and malaise/fatigue.  HENT: Negative for congestion.   Eyes: Negative for discharge.  Respiratory: Negative for shortness of breath.   Cardiovascular: Negative for chest pain, palpitations and leg swelling.  Gastrointestinal: Negative for nausea, abdominal pain and diarrhea.  Genitourinary: Negative for dysuria.  Musculoskeletal: Negative for falls.  Skin: Negative for rash.  Neurological: Negative for loss of consciousness and headaches.  Endo/Heme/Allergies: Negative for polydipsia.  Psychiatric/Behavioral: Negative for depression and suicidal ideas. The patient is nervous/anxious. The patient does not have insomnia.     Objective  BP 132/78  Pulse 98  Temp(Src) 98.3 F (36.8 C) (Oral)  Ht 6\' 2"  (1.88 m)  Wt 157 lb (71.215 kg)  BMI 20.15 kg/m2  SpO2 78%  Physical Exam  Physical Exam  Constitutional: He is oriented to person, place, and time and well-developed, well-nourished, and in no distress. No distress.   HENT:  Head: Normocephalic and atraumatic.  Eyes: Conjunctivae are normal.  Neck: Neck supple. No thyromegaly present.  Cardiovascular: Normal rate, regular rhythm and normal heart sounds.   No murmur heard. Pulmonary/Chest: Effort normal and breath sounds normal. No respiratory distress.  Abdominal: He exhibits no distension and no mass. There is no tenderness.  Musculoskeletal: He exhibits no edema.  Neurological: He is alert and oriented to person, place, and time.  Skin: Skin is warm.  Psychiatric: Memory, affect and judgment normal.    Lab Results  Component Value Date   TSH 2.26 07/07/2012   Lab Results  Component Value Date   WBC 9.4 02/10/2013   HGB 17.1* 02/10/2013   HCT 47.2 02/10/2013   MCV 84.1 02/10/2013   PLT 411* 02/10/2013   Lab Results  Component Value Date   CREATININE 0.85 02/10/2013   BUN 6 02/10/2013   NA 137 02/10/2013   K 3.4* 02/10/2013   CL 99 02/10/2013   CO2 27 02/10/2013   Lab Results  Component Value Date   ALT 16 02/10/2013   AST 19 02/10/2013   ALKPHOS 71 02/10/2013   BILITOT 0.5 02/10/2013     Assessment & Plan  Alcohol abuse Reports no alcohol intake in over 7 months.   ADHD (attention deficit hyperactivity disorder) Stable on current meds. Given refills today  Tachycardia wnl today  Tobacco abuse Encouraged cessation today.

## 2013-07-02 NOTE — Patient Instructions (Addendum)
Start a probiotic such as Digestive Advantage, Align or generic   Nicotine Addiction Nicotine can act as both a stimulant (excites/activates) and a sedative (calms/quiets). Immediately after exposure to nicotine, there is a "kick" caused in part by the drug's stimulation of the adrenal glands and resulting discharge of adrenaline (epinephrine). The rush of adrenaline stimulates the body and causes a sudden release of sugar. This means that smokers are always slightly hyperglycemic. Hyperglycemic means that the blood sugar is high, just like in diabetics. Nicotine also decreases the amount of insulin which helps control sugar levels in the body. There is an increase in blood pressure, breathing, and the rate of heart beats.  In addition, nicotine indirectly causes a release of dopamine in the brain that controls pleasure and motivation. A similar reaction is seen with other drugs of abuse, such as cocaine and heroin. This dopamine release is thought to cause the pleasurable sensations when smoking. In some different cases, nicotine can also create a calming effect, depending on sensitivity of the smoker's nervous system and the dose of nicotine taken. WHAT HAPPENS WHEN NICOTINE IS TAKEN FOR LONG PERIODS OF TIME?  Long-term use of nicotine results in addiction. It is difficult to stop.  Repeated use of nicotine creates tolerance. Higher doses of nicotine are needed to get the "kick." When nicotine use is stopped, withdrawal may last a month or more. Withdrawal may begin within a few hours after the last cigarette. Symptoms peak within the first few days and may lessen within a few weeks. For some people, however, symptoms may last for months or longer. Withdrawal symptoms include:   Irritability.  Craving.  Learning and attention deficits.  Sleep disturbances.  Increased appetite. Craving for tobacco may last for 6 months or longer. Many behaviors done while using nicotine can also play a part in  the severity of withdrawal symptoms. For some people, the feel, smell, and sight of a cigarette and the ritual of obtaining, handling, lighting, and smoking the cigarette are closely linked with the pleasure of smoking. When stopped, they also miss the related behaviors which make the withdrawal or craving worse. While nicotine gum and patches may lessen the drug aspects of withdrawal, cravings often persist. WHAT ARE THE MEDICAL CONSEQUENCES OF NICOTINE USE?  Nicotine addiction accounts for one-third of all cancers. The top cancer caused by tobacco is lung cancer. Lung cancer is the number one cancer killer of both men and women.  Smoking is also associated with cancers of the:  Mouth.  Pharynx.  Larynx.  Esophagus.  Stomach.  Pancreas.  Cervix.  Kidney.  Ureter.  Bladder.  Smoking also causes lung diseases such as lasting (chronic) bronchitis and emphysema.  It worsens asthma in adults and children.  Smoking increases the risk of heart disease, including:  Stroke.  Heart attack.  Vascular disease.  Aneurysm.  Passive or secondary smoke can also increase medical risks including:  Asthma in children.  Sudden Infant Death Syndrome (SIDS).  Additionally, dropped cigarettes are the leading cause of residential fire fatalities.  Nicotine poisoning has been reported from accidental ingestion of tobacco products by children and pets. Death usually results in a few minutes from respiratory failure (when a person stops breathing) caused by paralysis. TREATMENT   Medication. Nicotine replacement medicines such as nicotine gum and the patch are used to stop smoking. These medicines gradually lower the dosage of nicotine in the body. These medicines do not contain the carbon monoxide and other toxins found in tobacco  smoke.  Hypnotherapy.  Relaxation therapy.  Nicotine Anonymous (a 12-step support program). Find times and locations in your local yellow pages. Document  Released: 06/20/2004 Document Revised: 01/07/2012 Document Reviewed: 11/12/2007 [employer] [facility] [employer] Patient Information 2014 [place], [place].

## 2013-07-05 NOTE — Assessment & Plan Note (Signed)
Reports no alcohol intake in over 7 months.

## 2013-07-05 NOTE — Assessment & Plan Note (Signed)
wnl today 

## 2013-07-05 NOTE — Assessment & Plan Note (Signed)
Encouraged cessation today  

## 2013-07-05 NOTE — Assessment & Plan Note (Signed)
Stable on current meds. Given refills today

## 2013-07-21 ENCOUNTER — Telehealth: Payer: Self-pay

## 2013-07-21 NOTE — Telephone Encounter (Signed)
[facility] pharmacy left a message stating that pt brought in his Adderall RX that states can be filled on 07-24-13 and asked pharmacy to call and ask [place] to refill early? Pharmacist stated in message that last fill was 06-23-13  Please advise?

## 2013-07-21 NOTE — Telephone Encounter (Signed)
He can fill on 07/22/13

## 2013-07-22 NOTE — Telephone Encounter (Signed)
Patient called back regarding this. I explained to him that [name] has tried calling the pharmacy twice but phone just rings. I told him that [name] will try again when she is not seeing patients.

## 2013-07-22 NOTE — Telephone Encounter (Signed)
Left a detailed message for patient

## 2013-07-22 NOTE — Telephone Encounter (Signed)
i informed Rosanne Ashing that this was ok to refill.  Also informed pt

## 2013-07-22 NOTE — Telephone Encounter (Signed)
Tried to call pharmacy twice. Phone just rings

## 2013-08-18 ENCOUNTER — Telehealth: Payer: Self-pay

## 2013-08-18 NOTE — Telephone Encounter (Signed)
Cannot do early sorry

## 2013-08-18 NOTE — Telephone Encounter (Signed)
Pt called stating that he was shaving this morning and reached up to get his ADD medication and 7 or 8 tablets went down the drain. Pt states he is supposed to get a refill on the 24 th. Pt would like to know what he should do or if MD can authorize for an early refill?  Please advise? I informed pt that we typically don't do early refills on controlled substances. Pt stated he understood but he has been careful with his medication and it was an honest mistake. I informed pt I would send a note to MD and call him back later

## 2013-08-18 NOTE — Telephone Encounter (Signed)
Notified pt and he voices understanding. 

## 2013-08-21 ENCOUNTER — Telehealth: Payer: Self-pay | Admitting: Family Medicine

## 2013-08-21 NOTE — Telephone Encounter (Signed)
Look at previous note that pt can't have this early. Pts mom also just called here and I informed her the same. Pts mother states that this is to help control his bipolar also. I informed pts mother that Dr Abner Greenspan said no early refill. We have done this before and pt was aware that he will only be able to take 1 a day on some days to make this last until the next RX is due. Pts mother then stated that pt has handicap "issues". I stated that I was sorry but this is the protocol and we have made several accommodations for the patient and he is well aware that the last month was supposed to last.

## 2013-08-21 NOTE — Telephone Encounter (Signed)
Patient states that he took prescription to pharmacy but the prescription told him that they could not fill until 08/23/13. Patient says that the pharmacy told him to call us and have [place] call the pharmacy.

## 2013-09-18 ENCOUNTER — Other Ambulatory Visit: Payer: Self-pay | Admitting: Family Medicine

## 2013-09-18 NOTE — Telephone Encounter (Signed)
Current rx request from pharmacy is for citalopram 40mg  1 tablet daily. Current med list says 40mg  tablet, take 20mg  daily. Left message for pt to call and verify what dose he is currently taking.

## 2013-09-21 NOTE — Telephone Encounter (Signed)
So I have reviewed his chart and I think he should come back in to discuss options. Looking over his vitals I would prefer he stick to 20 mg of the short acting twice a day or 30 mg of the long acting once a day til we can check vitals here. Disp: Adderall 20 mg po bid #60 if he is agreeable

## 2013-09-21 NOTE — Telephone Encounter (Signed)
Pt called back and verified that he takes 1/2 of 40mg  tablet daily for a total of 20mg  daily.  Med list updated and refill sent.  Pt also states that his next rx for adderall is for 20mg  twice a day and he usually takes 30mg  twice a day. Also states that one of his Rxs was for the XR form and he did not tolerate it well due to: decreased appetite all day, fatigue and migraines. Doesn't want the XR again and wants to bring November Rx back and pick up correct strength of 30mg  twice a day.  Please advise.

## 2013-09-21 NOTE — Telephone Encounter (Signed)
Pt is coming in tomorrow so we can discuss all these changes then. I will notify pt

## 2013-09-22 ENCOUNTER — Ambulatory Visit (INDEPENDENT_AMBULATORY_CARE_PROVIDER_SITE_OTHER): Payer: Medicare Other | Admitting: Family Medicine

## 2013-09-22 ENCOUNTER — Encounter: Payer: Self-pay | Admitting: Family Medicine

## 2013-09-22 VITALS — BP 110/74 | HR 60 | Temp 97.9°F | Ht 74.0 in | Wt 157.0 lb

## 2013-09-22 DIAGNOSIS — F411 Generalized anxiety disorder: Secondary | ICD-10-CM

## 2013-09-22 DIAGNOSIS — F17200 Nicotine dependence, unspecified, uncomplicated: Secondary | ICD-10-CM

## 2013-09-22 DIAGNOSIS — Z720 Tobacco use: Secondary | ICD-10-CM

## 2013-09-22 DIAGNOSIS — F988 Other specified behavioral and emotional disorders with onset usually occurring in childhood and adolescence: Secondary | ICD-10-CM

## 2013-09-22 DIAGNOSIS — R000 Tachycardia, unspecified: Secondary | ICD-10-CM

## 2013-09-22 DIAGNOSIS — F909 Attention-deficit hyperactivity disorder, unspecified type: Secondary | ICD-10-CM

## 2013-09-22 DIAGNOSIS — F1020 Alcohol dependence, uncomplicated: Secondary | ICD-10-CM

## 2013-09-22 MED ORDER — AMPHETAMINE-DEXTROAMPHETAMINE 20 MG PO TABS: 20.0000 mg | ORAL_TABLET | Freq: Two times a day (BID) | ORAL | Status: DC

## 2013-09-22 MED ORDER — ALPRAZOLAM ER 3 MG PO TB24: 3.0000 mg | ORAL_TABLET | ORAL | Status: DC

## 2013-09-22 NOTE — Patient Instructions (Signed)
Attention Deficit Hyperactivity Disorder Attention deficit hyperactivity disorder (ADHD) is a problem with behavior issues based on the way the brain functions (neurobehavioral disorder). It is a common reason for behavior and academic problems in school. CAUSES  The cause of ADHD is unknown in most cases. It may run in families. It sometimes can be associated with learning disabilities and other behavioral problems. SYMPTOMS  There are 3 types of ADHD. The 3 types and some of the symptoms include:  Inattentive  Gets bored or distracted easily.  Loses or forgets things. Forgets to hand in homework.  Has trouble organizing or completing tasks.  Difficulty staying on task.  An inability to organize daily tasks and school work.  Leaving projects, chores, or homework unfinished.  Trouble paying attention or responding to details. Careless mistakes.  Difficulty following directions. Often seems like is not listening.  Dislikes activities that require sustained attention (like chores or homework).  Hyperactive-impulsive  Feels like it is impossible to sit still or stay in a seat. Fidgeting with hands and feet.  Trouble waiting turn.  Talking too much or out of turn. Interruptive.  Speaks or acts impulsively.  Aggressive, disruptive behavior.  Constantly busy or on the go, noisy.  Combined  Has symptoms of both of the above. Often children with ADHD feel discouraged about themselves and with school. They often perform well below their abilities in school. These symptoms can cause problems in home, school, and in relationships with peers. As children get older, the excess motor activities can calm down, but the problems with paying attention and staying organized persist. Most children do not outgrow ADHD but with good treatment can learn to cope with the symptoms. DIAGNOSIS  When ADHD is suspected, the diagnosis should be made by professionals trained in ADHD.  Diagnosis will  include:  Ruling out other reasons for the child's behavior.  The caregivers will check with the child's school and check their medical records.  They will talk to teachers and parents.  Behavior rating scales for the child will be filled out by those dealing with the child on a daily basis. A diagnosis is made only after all information has been considered. TREATMENT  Treatment usually includes behavioral treatment often along with medicines. It may include stimulant medicines. The stimulant medicines decrease impulsivity and hyperactivity and increase attention. Other medicines used include antidepressants and certain blood pressure medicines. Most experts agree that treatment for ADHD should address all aspects of the child's functioning. Treatment should not be limited to the use of medicines alone. Treatment should include structured classroom management. The parents must receive education to address rewarding good behavior, discipline, and limit-setting. Tutoring or behavioral therapy or both should be available for the child. If untreated, the disorder can have long-term serious effects into adolescence and adulthood. HOME CARE INSTRUCTIONS   Often with ADHD there is a lot of frustration among the family in dealing with the illness. There is often blame and anger that is not warranted. This is a life long illness. There is no way to prevent ADHD. In many cases, because the problem affects the family as a whole, the entire family may need help. A therapist can help the family find better ways to handle the disruptive behaviors and promote change. If the child is young, most of the therapist's work is with the parents. Parents will learn techniques for coping with and improving their child's behavior. Sometimes only the child with the ADHD needs counseling. Your caregivers can help   you make these decisions.  Children with ADHD may need help in organizing. Some helpful tips include:  Keep  routines the same every day from wake-up time to bedtime. Schedule everything. This includes homework and playtime. This should include outdoor and indoor recreation. Keep the schedule on the refrigerator or a bulletin board where it is frequently seen. Mark schedule changes as far in advance as possible.  Have a place for everything and keep everything in its place. This includes clothing, backpacks, and school supplies.  Encourage writing down assignments and bringing home needed books.  Offer your child a well-balanced diet. Breakfast is especially important for school performance. Children should avoid drinks with caffeine including:  Soft drinks.  Coffee.  Tea.  However, some older children (adolescents) may find these drinks helpful in improving their attention.  Children with ADHD need consistent rules that they can understand and follow. If rules are followed, give small rewards. Children with ADHD often receive, and expect, criticism. Look for good behavior and praise it. Set realistic goals. Give clear instructions. Look for activities that can foster success and self-esteem. Make time for pleasant activities with your child. Give lots of affection.  Parents are their children's greatest advocates. Learn as much as possible about ADHD. This helps you become a stronger and better advocate for your child. It also helps you educate your child's teachers and instructors if they feel inadequate in these areas. Parent support groups are often helpful. A national group with local chapters is called CHADD (Children and Adults with Attention Deficit Hyperactivity Disorder). PROGNOSIS  There is no cure for ADHD. Children with the disorder seldom outgrow it. Many find adaptive ways to accommodate the ADHD as they mature. SEEK MEDICAL CARE IF:  Your child has repeated muscle twitches, cough or speech outbursts.  Your child has sleep problems.  Your child has a marked loss of  appetite.  Your child develops depression.  Your child has new or worsening behavioral problems.  Your child develops dizziness.  Your child has a racing heart.  Your child has stomach pains.  Your child develops headaches. Document Released: 10/05/2002 Document Revised: 01/07/2012 Document Reviewed: 05/06/2013 Exit[employer] Patient Information 2014 [employer].  

## 2013-09-22 NOTE — Progress Notes (Signed)
Pre visit review using our clinic review tool, if applicable. No additional management support is needed unless otherwise documented below in the visit note. 

## 2013-09-27 ENCOUNTER — Encounter: Payer: Self-pay | Admitting: Family Medicine

## 2013-09-27 NOTE — Assessment & Plan Note (Signed)
Will continue Adderall short acting 20 mg tabs but only at bid dosing, given rx for 3 months today

## 2013-09-27 NOTE — Assessment & Plan Note (Signed)
Encouraged complete cessation. 

## 2013-09-27 NOTE — Assessment & Plan Note (Signed)
Responding well to Celexa

## 2013-09-27 NOTE — Progress Notes (Signed)
Patient ID: DETROIT FRIEDEN, male   DOB: July 01, 1981, 32 y.o.   MRN: [id number] BODIE ABERNETHY [id number] January 03, 1981 09/27/2013      Progress Note-Follow Up  Subjective  Chief Complaint  Chief Complaint  Patient presents with  . Follow-up    HPI  Patient is a 32 year old Caucasian male who is in today for followup. Generally doing well. Continues to smoke but offers no other acute complaints. Did have some migraines in the past month. Responded to Advil. No other recent illness. No fevers, chest pain, palpitations, shortness of breath, insomnia, anxiety, GI or GU complaints. None have flared in the last one to 2 months.  Past Medical History  Diagnosis Date  . Anxiety   . Depression   . ADD (attention deficit disorder)   . Insomnia   . Outbursts of anger   . Concussion     X 6- 7  . ADHD (attention deficit hyperactivity disorder) 04/02/2011  . 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 Tobacco abuse 04/28/2011  . Preventative health care 10/03/2011  . ED (erectile dysfunction) 05/12/2012    Past Surgical History  Procedure Laterality Date  . Punctured lung    . Toe surgeries  during childhood     for ingrown toenails    Family History  Problem Relation Age of Onset  . Depression Mother   . Anxiety disorder Mother   . Depression Brother   . Cancer Paternal Grandmother     lung/ didn't smoke  . Diabetes Brother     History   Social History  . Marital Status: Single    Spouse Name: N/A    Number of Children: N/A  . Years of Education: N/A   Occupational History  . Not on file.   Social History Main Topics  . Smoking status: Current Every Day Smoker -- 0.30 packs/day    Types: Cigarettes  . Smokeless tobacco: Never Used  . Alcohol Use: 21.6 oz/week    36 Cans of beer per week     Comment: beer occasionally last drink 6 days ago, uses magic mushrooms 5 times a year- stopped drinking 1.5 months ago  . Drug Use: Yes   Special: Marijuana, Other-see comments  . Sexual Activity: Yes    Partners: Female   Other Topics Concern  . Not on file   Social History Narrative  . No narrative on file    Current Outpatient Prescriptions on File Prior to Visit  Medication Sig Dispense Refill  . cholecalciferol (VITAMIN D) 1000 UNITS tablet Take 1,000 Units by mouth every morning.      . citalopram (CELEXA) 40 MG tablet Take 0.5 tablets (20 mg total) by mouth daily.  15 tablet  3  . fish oil-omega-3 fatty acids 1000 MG capsule Take 2 g by mouth daily.      . Multiple Vitamin (MULTIVITAMIN) tablet Take 1 tablet by mouth every morning.       . nebivolol (BYSTOLIC) 5 MG tablet Take 1 tablet (5 mg total) by mouth every morning.  30 tablet  5  . vitamin B-12 (CYANOCOBALAMIN) 1000 MCG tablet Take 1,000 mcg by mouth every morning.      . vitamin C (ASCORBIC ACID) 500 MG tablet Take 500 mg by mouth daily.       No current facility-administered medications on file prior to visit.    Allergies  Allergen Reactions  . Haloperidol Decanoate   . Hydroxyzine Anxiety  EXCESSIVE ANXIOUNESS  . Penicillins Hives  . Valium     Review of Systems  Review of Systems  Constitutional: Negative for fever and malaise/fatigue.  HENT: Negative for congestion.        2 migraines in past month responded to Advil  Eyes: Negative for discharge.  Respiratory: Negative for shortness of breath.   Cardiovascular: Negative for chest pain, palpitations and leg swelling.  Gastrointestinal: Negative for nausea, abdominal pain and diarrhea.  Genitourinary: Negative for dysuria.  Musculoskeletal: Negative for falls.  Skin: Negative for rash.  Neurological: Positive for headaches. Negative for loss of consciousness.  Endo/Heme/Allergies: Negative for polydipsia.  Psychiatric/Behavioral: Negative for depression and suicidal ideas. The patient is not nervous/anxious and does not have insomnia.     Objective  BP 110/74  Pulse 60   Temp(Src) 97.9 F (36.6 C) (Oral)  Ht 6\' 2"  (1.88 m)  Wt 157 lb (71.215 kg)  BMI 20.15 kg/m2  SpO2 99%  Physical Exam  Physical Exam  Constitutional: He is oriented to person, place, and time and well-developed, well-nourished, and in no distress. No distress.  HENT:  Head: Normocephalic and atraumatic.  Eyes: Conjunctivae are normal.  Neck: Neck supple. No thyromegaly present.  Cardiovascular: Normal rate, regular rhythm and normal heart sounds.   No murmur heard. Pulmonary/Chest: Effort normal and breath sounds normal. No respiratory distress.  Abdominal: He exhibits no distension and no mass. There is no tenderness.  Musculoskeletal: He exhibits no edema.  Neurological: He is alert and oriented to person, place, and time.  Skin: Skin is warm.  Psychiatric: Memory, affect and judgment normal.    Lab Results  Component Value Date   TSH 2.26 07/07/2012   Lab Results  Component Value Date   WBC 9.4 02/10/2013   HGB 17.1* 02/10/2013   HCT 47.2 02/10/2013   MCV 84.1 02/10/2013   PLT 411* 02/10/2013   Lab Results  Component Value Date   CREATININE 0.85 02/10/2013   BUN 6 02/10/2013   NA 137 02/10/2013   K 3.4* 02/10/2013   CL 99 02/10/2013   CO2 27 02/10/2013   Lab Results  Component Value Date   ALT 16 02/10/2013   AST 19 02/10/2013   ALKPHOS 71 02/10/2013   BILITOT 0.5 02/10/2013     Assessment & Plan  Tachycardia Improved on Bystolic continue same  Tobacco abuse Encouraged complete cessation  ADHD (attention deficit hyperactivity disorder) Will continue Adderall short acting 20 mg tabs but only at bid dosing, given rx for 3 months today  Alcoholism In remission  GAD (generalized anxiety disorder) Responding well to Celexa

## 2013-09-27 NOTE — Assessment & Plan Note (Signed)
Improved on Bystolic continue same

## 2013-09-27 NOTE — Assessment & Plan Note (Signed)
In remission.

## 2013-11-24 ENCOUNTER — Encounter: Payer: Self-pay | Admitting: Family Medicine

## 2013-11-24 ENCOUNTER — Ambulatory Visit (INDEPENDENT_AMBULATORY_CARE_PROVIDER_SITE_OTHER): Payer: Medicare Other | Admitting: Family Medicine

## 2013-11-24 VITALS — BP 124/84 | HR 71 | Temp 97.9°F | Ht 74.0 in | Wt 148.1 lb

## 2013-11-24 DIAGNOSIS — F411 Generalized anxiety disorder: Secondary | ICD-10-CM

## 2013-11-24 DIAGNOSIS — Z720 Tobacco use: Secondary | ICD-10-CM

## 2013-11-24 DIAGNOSIS — R002 Palpitations: Secondary | ICD-10-CM

## 2013-11-24 DIAGNOSIS — F988 Other specified behavioral and emotional disorders with onset usually occurring in childhood and adolescence: Secondary | ICD-10-CM

## 2013-11-24 DIAGNOSIS — F909 Attention-deficit hyperactivity disorder, unspecified type: Secondary | ICD-10-CM

## 2013-11-24 DIAGNOSIS — F17200 Nicotine dependence, unspecified, uncomplicated: Secondary | ICD-10-CM

## 2013-11-24 DIAGNOSIS — F1020 Alcohol dependence, uncomplicated: Secondary | ICD-10-CM

## 2013-11-24 DIAGNOSIS — R000 Tachycardia, unspecified: Secondary | ICD-10-CM

## 2013-11-24 MED ORDER — AMPHETAMINE-DEXTROAMPHETAMINE 20 MG PO TABS: 20.0000 mg | ORAL_TABLET | Freq: Two times a day (BID) | ORAL | Status: DC

## 2013-11-24 MED ORDER — ALPRAZOLAM ER 3 MG PO TB24: 3.0000 mg | ORAL_TABLET | ORAL | Status: DC

## 2013-11-24 MED ORDER — NEBIVOLOL HCL 2.5 MG PO TABS: 2.5000 mg | ORAL_TABLET | Freq: Every day | ORAL | Status: DC | PRN

## 2013-11-24 NOTE — Progress Notes (Signed)
Pre visit review using our clinic review tool, if applicable. No additional management support is needed unless otherwise documented below in the visit note. 

## 2013-11-24 NOTE — Patient Instructions (Signed)
Nonspecific Tachycardia Tachycardia is a faster than normal heartbeat (more than 100 beats per minute). In adults, the heart normally beats between 60 and 100 times a minute. A fast heartbeat may be a normal response to exercise or stress. It does not necessarily mean that something is wrong. However, sometimes when your heart beats too fast it may not be able to pump enough blood to the rest of your body. This can result in chest pain, shortness of breath, dizziness, and even fainting. Nonspecific tachycardia means that the specific cause or pattern of your tachycardia is unknown. CAUSES  Tachycardia may be harmless or it may be due to a more serious underlying cause. Possible causes of tachycardia include:  Exercise or exertion.  Fever.  Pain or injury.  Infection.  Loss of body fluids (dehydration).  Overactive thyroid.  Lack of red blood cells (anemia).  Anxiety and stress.  Alcohol.  Caffeine.  Tobacco products.  Diet pills.  Illegal drugs.  Heart disease. SYMPTOMS  Rapid or irregular heartbeat (palpitations).  Suddenly feeling your heart beating (cardiac awareness).  Dizziness.  Tiredness (fatigue).  Shortness of breath.  Chest pain.  Nausea.  Fainting. DIAGNOSIS  Your caregiver will perform a physical exam and take your medical history. In some cases, a heart specialist (cardiologist) may be consulted. Your caregiver may also order:  Blood tests.  Electrocardiography. This test records the electrical activity of your heart.  A heart monitoring test. TREATMENT  Treatment will depend on the likely cause of your tachycardia. The goal is to treat the underlying cause of your tachycardia. Treatment methods may include:  Replacement of fluids or blood through an intravenous (IV) tube for moderate to severe dehydration or anemia.  New medicines or changes in your current medicines.  Diet and lifestyle changes.  Treatment for certain  infections.  Stress relief or relaxation methods. HOME CARE INSTRUCTIONS   Rest.  Drink enough fluids to keep your urine clear or pale yellow.  Do not smoke.  Avoid:  Caffeine.  Tobacco.  Alcohol.  Chocolate.  Stimulants such as over-the-counter diet pills or pills that help you stay awake.  Situations that cause anxiety or stress.  Illegal drugs such as marijuana, phencyclidine (PCP), and cocaine.  Only take medicine as directed by your caregiver.  Keep all follow-up appointments as directed by your caregiver. SEEK IMMEDIATE MEDICAL CARE IF:   You have pain in your chest, upper arms, jaw, or neck.  You become weak, dizzy, or feel faint.  You have palpitations that will not go away.  You vomit, have diarrhea, or pass blood in your stool.  Your skin is cool, pale, and wet.  You have a fever that will not go away with rest, fluids, and medicine. MAKE SURE YOU:   Understand these instructions.  Will watch your condition.  Will get help right away if you are not doing well or get worse. Document Released: 11/22/2004 Document Revised: 01/07/2012 Document Reviewed: 09/25/2011 Exit[employer] Patient Information 2014 [employer].  

## 2013-11-24 NOTE — Progress Notes (Signed)
Patient ID: Jesse Jones, male   DOB: 11/14/1980, 33 y.o.   MRN: [id number] Jesse KielMatthew A Jones [id number] 11/14/1980 11/24/2013      Progress Note-Follow Up  Subjective  Chief Complaint  Chief Complaint  Patient presents with  . Follow-up    2 month    HPI  Patient is a 33 year old Caucasian male who is in today for followup. History very well. He has stayed away from her friends that were causing him significant stress and anxiety. He reports his mood has been very stable and has been much less anxious. There's been no drug or alcohol use. Unfortunately continues to smoke a half pack per day. No recent illness. Denies headache chest pain, palpitations, shortness of breath, GI or GU concerns at this time.  Past Medical History  Diagnosis Date  . Anxiety   . Depression   . ADD (attention deficit disorder)   . Insomnia   . Outbursts of anger   . Concussion     X 6- 7  . ADHD (attention deficit hyperactivity disorder) 04/02/2011  . 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 Tobacco abuse 04/28/2011  . Preventative health care 10/03/2011  . ED (erectile dysfunction) 05/12/2012    Past Surgical History  Procedure Laterality Date  . Punctured lung    . Toe surgeries  during childhood     for ingrown toenails    Family History  Problem Relation Age of Onset  . Depression Mother   . Anxiety disorder Mother   . Depression Brother   . Cancer Paternal Grandmother     lung/ didn't smoke  . Diabetes Brother     History   Social History  . Marital Status: Single    Spouse Name: N/A    Number of Children: N/A  . Years of Education: N/A   Occupational History  . Not on file.   Social History Main Topics  . Smoking status: Current Every Day Smoker -- 0.30 packs/day    Types: Cigarettes  . Smokeless tobacco: Never Used  . Alcohol Use: 21.6 oz/week    36 Cans of beer per week     Comment: beer occasionally last drink 6 days ago, uses magic  mushrooms 5 times a year- stopped drinking 1.5 months ago  . Drug Use: Yes    Special: Marijuana, Other-see comments  . Sexual Activity: Yes    Partners: Female   Other Topics Concern  . Not on file   Social History Narrative  . No narrative on file    Current Outpatient Prescriptions on File Prior to Visit  Medication Sig Dispense Refill  . cholecalciferol (VITAMIN D) 1000 UNITS tablet Take 1,000 Units by mouth every morning.      . citalopram (CELEXA) 40 MG tablet Take 0.5 tablets (20 mg total) by mouth daily.  15 tablet  3  . fish oil-omega-3 fatty acids 1000 MG capsule Take 2 g by mouth daily.      . Multiple Vitamin (MULTIVITAMIN) tablet Take 1 tablet by mouth every morning.       . vitamin B-12 (CYANOCOBALAMIN) 1000 MCG tablet Take 1,000 mcg by mouth every morning.      . vitamin C (ASCORBIC ACID) 500 MG tablet Take 500 mg by mouth daily.      . nebivolol (BYSTOLIC) 5 MG tablet Take 1 tablet (5 mg total) by mouth every morning.  30 tablet  5   No current facility-administered medications on file  prior to visit.    Allergies  Allergen Reactions  . Haloperidol Decanoate   . Hydroxyzine Anxiety    EXCESSIVE ANXIOUNESS  . Penicillins Hives  . Valium     Review of Systems  Review of Systems  Constitutional: Negative for fever and malaise/fatigue.  HENT: Negative for congestion.   Eyes: Negative for discharge.  Respiratory: Negative for shortness of breath.   Cardiovascular: Negative for chest pain, palpitations and leg swelling.  Gastrointestinal: Negative for nausea, abdominal pain and diarrhea.  Genitourinary: Negative for dysuria.  Musculoskeletal: Negative for falls.  Skin: Negative for rash.  Neurological: Negative for loss of consciousness and headaches.  Endo/Heme/Allergies: Negative for polydipsia.  Psychiatric/Behavioral: Negative for depression and suicidal ideas. The patient is not nervous/anxious and does not have insomnia.     Objective  BP 124/84   Pulse 71  Temp(Src) 97.9 F (36.6 C) (Oral)  Ht 6\' 2"  (1.88 m)  Wt 148 lb 1.3 oz (67.169 kg)  BMI 19.00 kg/m2  SpO2 99%  Physical Exam  Physical Exam  Constitutional: He is oriented to person, place, and time and well-developed, well-nourished, and in no distress. No distress.  HENT:  Head: Normocephalic and atraumatic.  Eyes: Conjunctivae are normal.  Neck: Neck supple. No thyromegaly present.  Cardiovascular: Normal rate, regular rhythm and normal heart sounds.   No murmur heard. Pulmonary/Chest: Effort normal and breath sounds normal. No respiratory distress.  Abdominal: He exhibits no distension and no mass. There is no tenderness.  Musculoskeletal: He exhibits no edema.  Neurological: He is alert and oriented to person, place, and time.  Skin: Skin is warm.  Psychiatric: Memory, affect and judgment normal.    Lab Results  Component Value Date   TSH 2.26 07/07/2012   Lab Results  Component Value Date   WBC 9.4 02/10/2013   HGB 17.1* 02/10/2013   HCT 47.2 02/10/2013   MCV 84.1 02/10/2013   PLT 411* 02/10/2013   Lab Results  Component Value Date   CREATININE 0.85 02/10/2013   BUN 6 02/10/2013   NA 137 02/10/2013   K 3.4* 02/10/2013   CL 99 02/10/2013   CO2 27 02/10/2013   Lab Results  Component Value Date   ALT 16 02/10/2013   AST 19 02/10/2013   ALKPHOS 71 02/10/2013   BILITOT 0.5 02/10/2013     Assessment & Plan  Alcoholism No alcohol consumption at this time  Tachycardia Doing well off of beta blocker, will hold for now.  Tobacco abuse Still smoking 1/2 ppd, encouraged complete cessation for greater than 3 minutes  ADHD (attention deficit hyperactivity disorder) Given refills on Adderall, doing well on meds  GAD (generalized anxiety disorder) Doing well on combination of Celexa and Xanax, continue same

## 2013-11-25 ENCOUNTER — Encounter: Payer: Self-pay | Admitting: Family Medicine

## 2013-11-25 NOTE — Assessment & Plan Note (Signed)
No alcohol consumption at this time

## 2013-11-25 NOTE — Assessment & Plan Note (Signed)
Given refills on Adderall, doing well on meds

## 2013-11-25 NOTE — Assessment & Plan Note (Signed)
Doing well on combination of Celexa and Xanax, continue same

## 2013-11-25 NOTE — Assessment & Plan Note (Signed)
Doing well off of beta blocker, will hold for now.

## 2013-11-25 NOTE — Assessment & Plan Note (Addendum)
Still smoking 1/2 ppd, encouraged complete cessation for greater than 3 minutes

## 2013-12-01 ENCOUNTER — Telehealth: Payer: Self-pay | Admitting: Family Medicine

## 2013-12-01 NOTE — Telephone Encounter (Signed)
Relevant patient education assigned to patient using Emmi. ° °

## 2014-01-07 ENCOUNTER — Telehealth: Payer: Self-pay | Admitting: Family Medicine

## 2014-01-07 DIAGNOSIS — F411 Generalized anxiety disorder: Secondary | ICD-10-CM

## 2014-01-07 NOTE — Telephone Encounter (Signed)
So remind him he should pick a single pharmacy so this does not happen again, can refill xanax 30 d supply with 1 rf. Have him sign an updated drug contract.

## 2014-01-07 NOTE — Telephone Encounter (Signed)
Please advise refill?  Last Xanax was 11-24-13 quantity 30 with 0 refills

## 2014-01-07 NOTE — Telephone Encounter (Signed)
He usually gets 3 months at a time for alprazolam and 3 rx's for adderall 20mg .  He went to have his meds filled at [facility] and they were out of the alprazolam.  They called [facility] and [facility] filled it that one time with no refills.   [facility] now says they dont have his other two refills.

## 2014-01-08 MED ORDER — ALPRAZOLAM ER 3 MG PO TB24: 3.0000 mg | ORAL_TABLET | ORAL | Status: DC

## 2014-01-08 NOTE — Telephone Encounter (Signed)
[facility] [place]

## 2014-01-08 NOTE — Telephone Encounter (Signed)
Patient called in regarding this. I explained to him that Dr. Abner GreenspanBlyth will refill this. He would like a call from us when Rx has been sent.

## 2014-01-08 NOTE — Telephone Encounter (Signed)
Patient mom called on this. I explained to her that she is not listed on patients DPR to have patient call us.

## 2014-01-26 ENCOUNTER — Ambulatory Visit: Payer: Medicare Other | Admitting: Family Medicine

## 2014-02-01 ENCOUNTER — Other Ambulatory Visit: Payer: Self-pay | Admitting: Family Medicine

## 2014-02-15 ENCOUNTER — Ambulatory Visit (INDEPENDENT_AMBULATORY_CARE_PROVIDER_SITE_OTHER): Payer: Medicare Other | Admitting: Family Medicine

## 2014-02-15 ENCOUNTER — Telehealth: Payer: Self-pay | Admitting: Family Medicine

## 2014-02-15 ENCOUNTER — Encounter: Payer: Self-pay | Admitting: Family Medicine

## 2014-02-15 VITALS — BP 128/90 | HR 88 | Temp 98.1°F | Ht 74.0 in | Wt 154.1 lb

## 2014-02-15 DIAGNOSIS — Z720 Tobacco use: Secondary | ICD-10-CM

## 2014-02-15 DIAGNOSIS — F1011 Alcohol abuse, in remission: Secondary | ICD-10-CM

## 2014-02-15 DIAGNOSIS — T7840XA Allergy, unspecified, initial encounter: Secondary | ICD-10-CM

## 2014-02-15 DIAGNOSIS — F17200 Nicotine dependence, unspecified, uncomplicated: Secondary | ICD-10-CM | POA: Diagnosis not present

## 2014-02-15 DIAGNOSIS — R000 Tachycardia, unspecified: Secondary | ICD-10-CM

## 2014-02-15 DIAGNOSIS — F988 Other specified behavioral and emotional disorders with onset usually occurring in childhood and adolescence: Secondary | ICD-10-CM

## 2014-02-15 DIAGNOSIS — F909 Attention-deficit hyperactivity disorder, unspecified type: Secondary | ICD-10-CM

## 2014-02-15 MED ORDER — CITALOPRAM HYDROBROMIDE 40 MG PO TABS: 20.0000 mg | ORAL_TABLET | Freq: Every day | ORAL | Status: DC

## 2014-02-15 MED ORDER — AMPHETAMINE-DEXTROAMPHETAMINE 20 MG PO TABS: 20.0000 mg | ORAL_TABLET | Freq: Two times a day (BID) | ORAL | Status: DC

## 2014-02-15 MED ORDER — FLUTICASONE PROPIONATE 50 MCG/ACT NA SUSP: 2.0000 | Freq: Every day | NASAL | Status: DC | PRN

## 2014-02-15 NOTE — Progress Notes (Signed)
Pre visit review using our clinic review tool, if applicable. No additional management support is needed unless otherwise documented below in the visit note. 

## 2014-02-15 NOTE — Progress Notes (Signed)
Patient ID: Jesse Jones, male   DOB: [birth year], 33 y.o.   MRN: [id number] Jesse Jones [id number] [birth year] 02/15/2014      Progress Note-Follow Up  Subjective  Chief Complaint  Chief Complaint  Patient presents with  . Medication Refill    HPI  Patient is a 33 year old male in today for routine medical care. Doing well. Has not had any alcohol for over a year. Is down to a quarter pack per day cigarettes. His anxiety is manageable on his current medications and he feels well. No recent illness. He does note increased congestion in his left nares. No fevers or chills. No headache or ear pain. Denies CP/palp/SOB/HA/congestion/fevers/GI or GU c/o. Taking meds as prescribed  Past Medical History  Diagnosis Date  . Anxiety   . Depression   . ADD (attention deficit disorder)   . Insomnia   . Outbursts of anger   . Concussion     X 6- 7  . ADHD (attention deficit hyperactivity disorder) 04/02/2011  . 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 Tobacco abuse 04/28/2011  . Preventative health care 10/03/2011  . ED (erectile dysfunction) 05/12/2012    Past Surgical History  Procedure Laterality Date  . Punctured lung    . Toe surgeries  during childhood     for ingrown toenails    Family History  Problem Relation Age of Onset  . Depression Mother   . Anxiety disorder Mother   . Depression Brother   . Cancer Paternal Grandmother     lung/ didn't smoke  . Diabetes Brother     History   Social History  . Marital Status: Single    Spouse Name: N/A    Number of Children: N/A  . Years of Education: N/A   Occupational History  . Not on file.   Social History Main Topics  . Smoking status: Current Every Day Smoker -- 0.30 packs/day    Types: Cigarettes  . Smokeless tobacco: Never Used  . Alcohol Use: 21.6 oz/week    36 Cans of beer per week     Comment: beer occasionally last drink 6 days ago, uses magic mushrooms 5 times a year-  stopped drinking 1.5 months ago  . Drug Use: Yes    Special: Marijuana, Other-see comments  . Sexual Activity: Yes    Partners: Female   Other Topics Concern  . Not on file   Social History Narrative  . No narrative on file    Current Outpatient Prescriptions on File Prior to Visit  Medication Sig Dispense Refill  . ALPRAZolam (XANAX XR) 3 MG 24 hr tablet Take 1 tablet (3 mg total) by mouth every morning. This can be filled Feb 2015 rx  30 tablet  1  . cholecalciferol (VITAMIN D) 1000 UNITS tablet Take 1,000 Units by mouth every morning.      . fish oil-omega-3 fatty acids 1000 MG capsule Take 2 g by mouth daily.      . Multiple Vitamin (MULTIVITAMIN) tablet Take 1 tablet by mouth every morning.       . nebivolol (BYSTOLIC) 2.5 MG tablet Take 1 tablet (2.5 mg total) by mouth daily as needed. palpitations  30 tablet  3  . vitamin B-12 (CYANOCOBALAMIN) 1000 MCG tablet Take 1,000 mcg by mouth every morning.      . vitamin C (ASCORBIC ACID) 500 MG tablet Take 500 mg by mouth daily.  No current facility-administered medications on file prior to visit.    Allergies  Allergen Reactions  . Haloperidol Decanoate   . Hydroxyzine Anxiety    EXCESSIVE ANXIOUNESS  . Penicillins Hives  . Valium     Review of Systems  Review of Systems  Constitutional: Negative for fever and malaise/fatigue.  HENT: Positive for congestion.   Eyes: Negative for discharge.  Respiratory: Negative for shortness of breath.   Cardiovascular: Negative for chest pain, palpitations and leg swelling.  Gastrointestinal: Negative for nausea, abdominal pain and diarrhea.  Genitourinary: Negative for dysuria.  Musculoskeletal: Negative for falls.  Skin: Negative for rash.  Neurological: Negative for loss of consciousness and headaches.  Endo/Heme/Allergies: Negative for polydipsia.  Psychiatric/Behavioral: Negative for depression and suicidal ideas. The patient is not nervous/anxious and does not have  insomnia.     Objective  BP 128/90  Pulse 88  Temp(Src) 98.1 F (36.7 C) (Oral)  Ht 6\' 2"  (1.88 m)  Wt 154 lb 1.3 oz (69.89 kg)  BMI 19.77 kg/m2  SpO2 98%  Physical Exam  Physical Exam  Constitutional: He is oriented to person, place, and time and well-developed, well-nourished, and in no distress. No distress.  HENT:  Head: Normocephalic and atraumatic.  Eyes: Conjunctivae are normal.  Neck: Neck supple. No thyromegaly present.  Cardiovascular: Normal rate, regular rhythm and normal heart sounds.   No murmur heard. Pulmonary/Chest: Effort normal and breath sounds normal. No respiratory distress.  Abdominal: He exhibits no distension and no mass. There is no tenderness.  Musculoskeletal: He exhibits no edema.  Neurological: He is alert and oriented to person, place, and time.  Skin: Skin is warm.  Psychiatric: Memory, affect and judgment normal.    Lab Results  Component Value Date   TSH 2.26 07/07/2012   Lab Results  Component Value Date   WBC 9.4 02/10/2013   HGB 17.1* 02/10/2013   HCT 47.2 02/10/2013   MCV 84.1 02/10/2013   PLT 411* 02/10/2013   Lab Results  Component Value Date   CREATININE 0.85 02/10/2013   BUN 6 02/10/2013   NA 137 02/10/2013   K 3.4* 02/10/2013   CL 99 02/10/2013   CO2 27 02/10/2013   Lab Results  Component Value Date   ALT 16 02/10/2013   AST 19 02/10/2013   ALKPHOS 71 02/10/2013   BILITOT 0.5 02/10/2013     Assessment & Plan  Tachycardia Well controlled, no changes  Tobacco abuse Down to 1/4 ppd. Encouraged complete cessation. Discussed need to quit as relates to risk of numerous cancers, cardiac and pulmonary disease as well as neurologic complications. 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

## 2014-02-15 NOTE — Patient Instructions (Signed)

## 2014-02-15 NOTE — Telephone Encounter (Signed)
Relevant patient education assigned to patient using Emmi. ° °

## 2014-02-15 NOTE — Telephone Encounter (Signed)
Requesting refill of alprazolam be called in downstairs [facility], it will be cheaper

## 2014-02-16 NOTE — Telephone Encounter (Signed)
This is not due until 03-10-14.   Patient informed and stated that he just wanted us to know that he wanted it to go downstairs, so he doesn't void his new controlled substance contract.   Pt will call back 03-08-14 for us to send in new rx

## 2014-02-17 ENCOUNTER — Encounter: Payer: Self-pay | Admitting: Family Medicine

## 2014-02-17 DIAGNOSIS — T7840XA Allergy, unspecified, initial encounter: Secondary | ICD-10-CM | POA: Insufficient documentation

## 2014-02-17 NOTE — Assessment & Plan Note (Signed)
Is just over a year sober doing well

## 2014-02-17 NOTE — Assessment & Plan Note (Signed)
Well controlled, no changes 

## 2014-02-17 NOTE — Assessment & Plan Note (Signed)
Down to 1/4 ppd. Encouraged complete cessation. Discussed need to quit as relates to risk of numerous cancers, cardiac and pulmonary disease as well as neurologic complications. Counseled for greater than 3 minutes

## 2014-02-17 NOTE — Assessment & Plan Note (Signed)
Given refills today 

## 2014-02-17 NOTE — Assessment & Plan Note (Signed)
Try Flonase and antihistamines

## 2014-03-01 ENCOUNTER — Telehealth: Payer: Self-pay | Admitting: Family Medicine

## 2014-03-01 ENCOUNTER — Telehealth: Payer: Self-pay

## 2014-03-01 DIAGNOSIS — F411 Generalized anxiety disorder: Secondary | ICD-10-CM

## 2014-03-01 NOTE — Telephone Encounter (Signed)
Left detailed message informing patient of this. °

## 2014-03-01 NOTE — Telephone Encounter (Signed)
Fine but no refills for awhile I want to count the refill dates and keep track for a few months.

## 2014-03-01 NOTE — Telephone Encounter (Signed)
I informed pt but he states he gets this downstairs now. Can the RX be filled on the 8th due to Saturday being the 9th?

## 2014-03-01 NOTE — Telephone Encounter (Signed)
Please advise xanax refill?   Pt called in stating that his bottle was last filled on 02-04-14. I informed pt that the last RX was for 01-08-14 so it shouldn't of had the next refill until 02-08-14( 02-05-14 at the earliest). Pt states he is going to run out on Thursday. I informed pt that even if MD did do an early refill 30 days would be on 03-06-14. But really shouldn't be done until 03-10-14?

## 2014-03-01 NOTE — Telephone Encounter (Signed)
I agree can have rx not fill on 5/9. That is as early as I can do.

## 2014-03-01 NOTE — Telephone Encounter (Signed)
Patient is requesting a refill of alprazolam to be sent to [facility]. He states that he does have a couple of days worth left and would like a call when this has been sent

## 2014-03-01 NOTE — Telephone Encounter (Signed)
This is not due until 03-10-14. Please inform pt

## 2014-03-02 MED ORDER — ALPRAZOLAM ER 3 MG PO TB24: 3.0000 mg | ORAL_TABLET | ORAL | Status: DC

## 2014-03-02 NOTE — Telephone Encounter (Signed)
Patient called back regarding this and I informed him that rx has been faxed

## 2014-03-02 NOTE — Telephone Encounter (Signed)
rx faxed and will inform pt

## 2014-03-05 ENCOUNTER — Telehealth: Payer: Self-pay | Admitting: Family Medicine

## 2014-03-05 NOTE — Telephone Encounter (Signed)
Received paper work for PA on Alprazolam ER, forward to nurse

## 2014-03-08 NOTE — Telephone Encounter (Signed)
Per md see if patient has had any panic attacks?

## 2014-03-08 NOTE — Telephone Encounter (Signed)
Pt stated that he has panic attacks and anxiety.   Resent pa

## 2014-03-08 NOTE — Telephone Encounter (Signed)
PA denied for Alprazolam ER

## 2014-03-23 NOTE — Telephone Encounter (Signed)
This has been approved from 03-08-14 through 10-28-14  Also sent this to pharmacy

## 2014-04-02 ENCOUNTER — Other Ambulatory Visit: Payer: Self-pay | Admitting: Family Medicine

## 2014-04-02 NOTE — Telephone Encounter (Signed)
Last RX was done on 03-02-14 quantity 30 with 0 refills.  rx printed for md to sign and fax

## 2014-04-02 NOTE — Telephone Encounter (Signed)
Patient called in also. He states that he is almost out.

## 2014-05-03 ENCOUNTER — Other Ambulatory Visit: Payer: Self-pay | Admitting: Family Medicine

## 2014-05-03 ENCOUNTER — Ambulatory Visit: Payer: Self-pay | Admitting: Family Medicine

## 2014-05-03 DIAGNOSIS — Z0289 Encounter for other administrative examinations: Secondary | ICD-10-CM

## 2014-05-26 ENCOUNTER — Telehealth: Payer: Self-pay | Admitting: Family Medicine

## 2014-05-26 NOTE — Telephone Encounter (Signed)
He is scheduled for a 3 month follow up on 8-4 to refill his zanax adderall and celexa  His family will be out of town that week on vacation.  Can he get his prescriptions early  Can he get them to take with him to have filled in [place] can he get a short script that cover him from the 4th till they gets back and could get  In to see Dr B

## 2014-05-26 NOTE — Telephone Encounter (Signed)
He can have a 30 day supply of all 3 meds as long as they are within 5 days of being due

## 2014-05-26 NOTE — Telephone Encounter (Signed)
Patient not leaving town until 08.04.15, referring back to PCP/Assistant as none of medications are due at this time [Adderall & Celexa not due until 08.20.15 and Alprazolam 08.06.15] as provider stated Ok If w/i [5] days of being due; for provider review on 07.30.15 in office/SLS

## 2014-05-26 NOTE — Telephone Encounter (Signed)
Can send 1 month refill on Celexa, can print Alprazolam for pick up and has to wait on Adderall

## 2014-05-28 ENCOUNTER — Other Ambulatory Visit: Payer: Self-pay | Admitting: Family Medicine

## 2014-05-28 MED ORDER — CITALOPRAM HYDROBROMIDE 40 MG PO TABS: 20.0000 mg | ORAL_TABLET | Freq: Every day | ORAL | Status: DC

## 2014-05-28 MED ORDER — ALPRAZOLAM ER 3 MG PO TB24: ORAL_TABLET | ORAL | Status: DC

## 2014-05-28 MED ORDER — AMPHETAMINE-DEXTROAMPHETAMINE 20 MG PO TABS: 20.0000 mg | ORAL_TABLET | Freq: Two times a day (BID) | ORAL | Status: DC

## 2014-05-28 NOTE — Addendum Note (Signed)
Addended by: Regis BillSCATES, Marlet Korte L on: 05/28/2014 03:05 PM   Modules accepted: Orders

## 2014-05-28 NOTE — Telephone Encounter (Signed)
Celexa & Alprazolam faxed to pharmacy; Charlcie CradleMarge will inform patient that Adderall cannot be filled early/SLS

## 2014-05-28 NOTE — Telephone Encounter (Signed)
Please advise if ok to refill. Thanks 

## 2014-05-28 NOTE — Telephone Encounter (Addendum)
Patient here in office; Rx bottle shows fill date of 07.01.15 [was given May, June & July scripts on 04.20.15]; Rx printed for provider signature/SLS Rx completed and forwarded to patient w/apology for miscalculation on due date; pt apologized for becoming upset about the matter/SLS

## 2014-06-01 ENCOUNTER — Ambulatory Visit: Payer: Self-pay | Admitting: Family Medicine

## 2014-06-10 ENCOUNTER — Ambulatory Visit (INDEPENDENT_AMBULATORY_CARE_PROVIDER_SITE_OTHER): Payer: Medicare Other | Admitting: Family Medicine

## 2014-06-10 ENCOUNTER — Encounter: Payer: Self-pay | Admitting: Family Medicine

## 2014-06-10 VITALS — BP 130/70 | HR 94 | Temp 98.1°F | Ht 74.0 in | Wt 155.0 lb

## 2014-06-10 DIAGNOSIS — F909 Attention-deficit hyperactivity disorder, unspecified type: Secondary | ICD-10-CM | POA: Diagnosis not present

## 2014-06-10 DIAGNOSIS — Z5189 Encounter for other specified aftercare: Secondary | ICD-10-CM

## 2014-06-10 DIAGNOSIS — R000 Tachycardia, unspecified: Secondary | ICD-10-CM

## 2014-06-10 DIAGNOSIS — F908 Attention-deficit hyperactivity disorder, other type: Secondary | ICD-10-CM

## 2014-06-10 DIAGNOSIS — F419 Anxiety disorder, unspecified: Secondary | ICD-10-CM

## 2014-06-10 DIAGNOSIS — F329 Major depressive disorder, single episode, unspecified: Secondary | ICD-10-CM

## 2014-06-10 DIAGNOSIS — F1011 Alcohol abuse, in remission: Secondary | ICD-10-CM | POA: Diagnosis not present

## 2014-06-10 DIAGNOSIS — F17200 Nicotine dependence, unspecified, uncomplicated: Secondary | ICD-10-CM

## 2014-06-10 DIAGNOSIS — F341 Dysthymic disorder: Secondary | ICD-10-CM | POA: Diagnosis not present

## 2014-06-10 DIAGNOSIS — F988 Other specified behavioral and emotional disorders with onset usually occurring in childhood and adolescence: Secondary | ICD-10-CM | POA: Diagnosis not present

## 2014-06-10 DIAGNOSIS — Z720 Tobacco use: Secondary | ICD-10-CM

## 2014-06-10 DIAGNOSIS — F32A Depression, unspecified: Secondary | ICD-10-CM

## 2014-06-10 DIAGNOSIS — T7840XD Allergy, unspecified, subsequent encounter: Secondary | ICD-10-CM

## 2014-06-10 MED ORDER — AMPHETAMINE-DEXTROAMPHETAMINE 20 MG PO TABS: 20.0000 mg | ORAL_TABLET | Freq: Two times a day (BID) | ORAL | Status: DC

## 2014-06-10 MED ORDER — CITALOPRAM HYDROBROMIDE 40 MG PO TABS: 20.0000 mg | ORAL_TABLET | Freq: Every day | ORAL | Status: DC

## 2014-06-10 MED ORDER — ALPRAZOLAM ER 3 MG PO TB24: ORAL_TABLET | ORAL | Status: DC

## 2014-06-10 NOTE — Progress Notes (Signed)
Patient ID: Jesse Jones, male   DOB: February 09, 1981, 33 y.o.   MRN: [id number] AMED DATTA [id number] 07/18/1981 06/10/2014      Progress Note-Follow Up  Subjective  Chief Complaint  Chief Complaint  Patient presents with  . Follow-up    3 month    HPI  Patient is a 33 year old male in today for routine medical care. Doing well but unfortunately continues to smoke. No recent illness. Denies any drug or alcohol use since April 2014. No recent flares in allergies. Denies CP/palp/SOB/HA/congestion/fevers/GI or GU c/o. Taking meds as prescribed  Past Medical History  Diagnosis Date  . Anxiety   . Depression   . ADD (attention deficit disorder)   . Insomnia   . Outbursts of anger   . Concussion     X 6- 7  . ADHD (attention deficit hyperactivity disorder) 04/02/2011  . 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 Tobacco abuse 04/28/2011  . Preventative health care 10/03/2011  . ED (erectile dysfunction) 05/12/2012  . 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 Punctured lung    . Toe surgeries  during childhood     for ingrown toenails    Family History  Problem Relation Age of Onset  . Depression Mother   . Anxiety disorder Mother   . Depression Brother   . Cancer Paternal Grandmother     lung/ didn't smoke  . Diabetes Brother     History   Social History  . Marital Status: Single    Spouse Name: N/A    Number of Children: N/A  . Years of Education: N/A   Occupational History  . Not on file.   Social History Main Topics  . Smoking status: Current Every Day Smoker -- 0.30 packs/day    Types: Cigarettes  . Smokeless tobacco: Never Used  . Alcohol Use: 21.6 oz/week    36 Cans of beer per week     Comment: beer occasionally last drink 6 days ago, uses magic mushrooms 5 times a year- stopped drinking 1.5 months ago  . Drug Use: Yes    Special: Marijuana, Other-see comments   . Sexual Activity: Yes    Partners: Female   Other Topics Concern  . Not on file   Social History Narrative  . No narrative on file    Current Outpatient Prescriptions on File Prior to Visit  Medication Sig Dispense Refill  . cholecalciferol (VITAMIN D) 1000 UNITS tablet Take 1,000 Units by mouth every morning.      . fish oil-omega-3 fatty acids 1000 MG capsule Take 2 g by mouth daily.      . fluticasone (FLONASE) 50 MCG/ACT nasal spray Place 2 sprays into both nostrils daily as needed for allergies or rhinitis.  16 g  6  . Multiple Vitamin (MULTIVITAMIN) tablet Take 1 tablet by mouth every morning.       . nebivolol (BYSTOLIC) 2.5 MG tablet Take 1 tablet (2.5 mg total) by mouth daily as needed. palpitations  30 tablet  3  . vitamin B-12 (CYANOCOBALAMIN) 1000 MCG tablet Take 1,000 mcg by mouth every morning.      . vitamin C (ASCORBIC ACID) 500 MG tablet Take 500 mg by mouth daily.       No current facility-administered medications on file prior to visit.    Allergies  Allergen Reactions  . Haloperidol Decanoate   . Hydroxyzine  Anxiety    EXCESSIVE ANXIOUNESS  . Penicillins Hives  . Valium     Review of Systems  Review of Systems  Constitutional: Negative for fever and malaise/fatigue.  HENT: Negative for congestion.   Eyes: Negative for discharge.  Respiratory: Negative for shortness of breath.   Cardiovascular: Negative for chest pain, palpitations and leg swelling.  Gastrointestinal: Negative for nausea, abdominal pain and diarrhea.  Genitourinary: Negative for dysuria.  Musculoskeletal: Negative for falls.  Skin: Negative for rash.  Neurological: Negative for loss of consciousness and headaches.  Endo/Heme/Allergies: Negative for polydipsia.  Psychiatric/Behavioral: Negative for depression and suicidal ideas. The patient is not nervous/anxious and does not have insomnia.     Objective  BP 130/70  Pulse 126  Temp(Src) 98.1 F (36.7 C) (Oral)  Ht 6\' 2"   (1.88 m)  Wt 155 lb (70.308 kg)  BMI 19.89 kg/m2  SpO2 98%  Physical Exam  Physical Exam  Constitutional: He is oriented to person, place, and time and well-developed, well-nourished, and in no distress. No distress.  HENT:  Head: Normocephalic and atraumatic.  Eyes: Conjunctivae are normal.  Neck: Neck supple. No thyromegaly present.  Cardiovascular: Normal rate, regular rhythm and normal heart sounds.   No murmur heard. Pulmonary/Chest: Effort normal and breath sounds normal. No respiratory distress.  Abdominal: He exhibits no distension and no mass. There is no tenderness.  Musculoskeletal: He exhibits no edema.  Neurological: He is alert and oriented to person, place, and time.  Skin: Skin is warm.  Psychiatric: Memory, affect and judgment normal.    Lab Results  Component Value Date   TSH 2.26 07/07/2012   Lab Results  Component Value Date   WBC 9.4 02/10/2013   HGB 17.1* 02/10/2013   HCT 47.2 02/10/2013   MCV 84.1 02/10/2013   PLT 411* 02/10/2013   Lab Results  Component Value Date   CREATININE 0.85 02/10/2013   BUN 6 02/10/2013   NA 137 02/10/2013   K 3.4* 02/10/2013   CL 99 02/10/2013   CO2 27 02/10/2013   Lab Results  Component Value Date   ALT 16 02/10/2013   AST 19 02/10/2013   ALKPHOS 71 02/10/2013   BILITOT 0.5 02/10/2013     Assessment & Plan  Alcohol abuse, in remission Well over a year now and doing well.  ADHD (attention deficit hyperactivity disorder) Allowed a refill on his Adderall todya  Allergic state No recent flares.  Tobacco abuse Encouraged complete cessation. Discussed need to quit as relates to risk of numerous cancers, cardiac and pulmonary disease as well as neurologic complications. Counseled for greater than 3 minutes  Tachycardia Improved on recheck. Not needing Bystolic

## 2014-06-10 NOTE — Patient Instructions (Signed)
Nonspecific Tachycardia  Tachycardia is a faster than normal heartbeat (more than 100 beats per minute). In adults, the heart normally beats between 60 and 100 times a minute. A fast heartbeat may be a normal response to exercise or stress. It does not necessarily mean that something is wrong. However, sometimes when your heart beats too fast it may not be able to pump enough blood to the rest of your body. This can result in chest pain, shortness of breath, dizziness, and even fainting. Nonspecific tachycardia means that the specific cause or pattern of your tachycardia is unknown.  CAUSES   Tachycardia may be harmless or it may be due to a more serious underlying cause. Possible causes of tachycardia include:  · Exercise or exertion.  · Fever.  · Pain or injury.  · Infection.  · Loss of body fluids (dehydration).  · Overactive thyroid.  · Lack of red blood cells (anemia).  · Anxiety and stress.  · Alcohol.  · Caffeine.  · Tobacco products.  · Diet pills.  · Illegal drugs.  · Heart disease.  SYMPTOMS  · Rapid or irregular heartbeat (palpitations).  · Suddenly feeling your heart beating (cardiac awareness).  · Dizziness.  · Tiredness (fatigue).  · Shortness of breath.  · Chest pain.  · Nausea.  · Fainting.  DIAGNOSIS   Your caregiver will perform a physical exam and take your medical history. In some cases, a heart specialist (cardiologist) may be consulted. Your caregiver may also order:  · Blood tests.  · Electrocardiography. This test records the electrical activity of your heart.  · A heart monitoring test.  TREATMENT   Treatment will depend on the likely cause of your tachycardia. The goal is to treat the underlying cause of your tachycardia. Treatment methods may include:  · Replacement of fluids or blood through an intravenous (IV) tube for moderate to severe dehydration or anemia.  · New medicines or changes in your current medicines.  · Diet and lifestyle changes.  · Treatment for certain  infections.  · Stress relief or relaxation methods.  HOME CARE INSTRUCTIONS   · Rest.  · Drink enough fluids to keep your urine clear or pale yellow.  · Do not smoke.  · Avoid:  ¨ Caffeine.  ¨ Tobacco.  ¨ Alcohol.  ¨ Chocolate.  ¨ Stimulants such as over-the-counter diet pills or pills that help you stay awake.  ¨ Situations that cause anxiety or stress.  ¨ Illegal drugs such as marijuana, phencyclidine (PCP), and cocaine.  · Only take medicine as directed by your caregiver.  · Keep all follow-up appointments as directed by your caregiver.  SEEK IMMEDIATE MEDICAL CARE IF:   · You have pain in your chest, upper arms, jaw, or neck.  · You become weak, dizzy, or feel faint.  · You have palpitations that will not go away.  · You vomit, have diarrhea, or pass blood in your stool.  · Your skin is cool, pale, and wet.  · You have a fever that will not go away with rest, fluids, and medicine.  MAKE SURE YOU:   · Understand these instructions.  · Will watch your condition.  · Will get help right away if you are not doing well or get worse.  Document Released: 11/22/2004 Document Revised: 01/07/2012 Document Reviewed: 09/25/2011  [employer]® Patient Information ©2015 [employer]. This information is not intended to replace advice given to you by your health care provider. Make sure you discuss any questions   you have with your health care provider.

## 2014-06-10 NOTE — Progress Notes (Signed)
Pre visit review using our clinic review tool, if applicable. No additional management support is needed unless otherwise documented below in the visit note. 

## 2014-06-13 ENCOUNTER — Encounter: Payer: Self-pay | Admitting: Family Medicine

## 2014-06-13 NOTE — Assessment & Plan Note (Signed)
Allowed a refill on his Adderall todya

## 2014-06-13 NOTE — Assessment & Plan Note (Signed)
Well over a year now and doing well.

## 2014-06-13 NOTE — Assessment & Plan Note (Signed)
Encouraged complete cessation. Discussed need to quit as relates to risk of numerous cancers, cardiac and pulmonary disease as well as neurologic complications. Counseled for greater than 3 minutes 

## 2014-06-13 NOTE — Assessment & Plan Note (Signed)
Improved on recheck. Not needing Bystolic

## 2014-06-13 NOTE — Assessment & Plan Note (Signed)
No recent flares 

## 2014-09-13 ENCOUNTER — Encounter: Payer: Self-pay | Admitting: Family Medicine

## 2014-09-13 ENCOUNTER — Ambulatory Visit (INDEPENDENT_AMBULATORY_CARE_PROVIDER_SITE_OTHER): Payer: Medicare Other | Admitting: Family Medicine

## 2014-09-13 ENCOUNTER — Ambulatory Visit (INDEPENDENT_AMBULATORY_CARE_PROVIDER_SITE_OTHER): Payer: Medicare Other

## 2014-09-13 VITALS — BP 126/76 | HR 86 | Temp 98.2°F | Ht 74.0 in | Wt 154.0 lb

## 2014-09-13 DIAGNOSIS — Z23 Encounter for immunization: Secondary | ICD-10-CM | POA: Diagnosis not present

## 2014-09-13 DIAGNOSIS — F17210 Nicotine dependence, cigarettes, uncomplicated: Secondary | ICD-10-CM

## 2014-09-13 DIAGNOSIS — F908 Attention-deficit hyperactivity disorder, other type: Secondary | ICD-10-CM

## 2014-09-13 DIAGNOSIS — Z720 Tobacco use: Secondary | ICD-10-CM

## 2014-09-13 DIAGNOSIS — F1011 Alcohol abuse, in remission: Secondary | ICD-10-CM

## 2014-09-13 DIAGNOSIS — F1010 Alcohol abuse, uncomplicated: Secondary | ICD-10-CM

## 2014-09-13 DIAGNOSIS — R000 Tachycardia, unspecified: Secondary | ICD-10-CM

## 2014-09-13 DIAGNOSIS — F988 Other specified behavioral and emotional disorders with onset usually occurring in childhood and adolescence: Secondary | ICD-10-CM

## 2014-09-13 DIAGNOSIS — Z0000 Encounter for general adult medical examination without abnormal findings: Secondary | ICD-10-CM

## 2014-09-13 DIAGNOSIS — F3160 Bipolar disorder, current episode mixed, unspecified: Secondary | ICD-10-CM

## 2014-09-13 DIAGNOSIS — F909 Attention-deficit hyperactivity disorder, unspecified type: Secondary | ICD-10-CM

## 2014-09-13 MED ORDER — AMPHETAMINE-DEXTROAMPHETAMINE 20 MG PO TABS: 20.0000 mg | ORAL_TABLET | Freq: Two times a day (BID) | ORAL | Status: DC

## 2014-09-13 NOTE — Patient Instructions (Signed)
Annual if due by next visit  Nicotine Addiction Nicotine can act as both a stimulant (excites/activates) and a sedative (calms/quiets). Immediately after exposure to nicotine, there is a "kick" caused in part by the drug's stimulation of the adrenal glands and resulting discharge of adrenaline (epinephrine). The rush of adrenaline stimulates the body and causes a sudden release of sugar. This means that smokers are always slightly hyperglycemic. Hyperglycemic means that the blood sugar is high, just like in diabetics. Nicotine also decreases the amount of insulin which helps control sugar levels in the body. There is an increase in blood pressure, breathing, and the rate of heart beats.  In addition, nicotine indirectly causes a release of dopamine in the brain that controls pleasure and motivation. A similar reaction is seen with other drugs of abuse, such as cocaine and heroin. This dopamine release is thought to cause the pleasurable sensations when smoking. In some different cases, nicotine can also create a calming effect, depending on sensitivity of the smoker's nervous system and the dose of nicotine taken. WHAT HAPPENS WHEN NICOTINE IS TAKEN FOR LONG PERIODS OF TIME?  Long-term use of nicotine results in addiction. It is difficult to stop.  Repeated use of nicotine creates tolerance. Higher doses of nicotine are needed to get the "kick." When nicotine use is stopped, withdrawal may last a month or more. Withdrawal may begin within a few hours after the last cigarette. Symptoms peak within the first few days and may lessen within a few weeks. For some people, however, symptoms may last for months or longer. Withdrawal symptoms include:   Irritability.  Craving.  Learning and attention deficits.  Sleep disturbances.  Increased appetite. Craving for tobacco may last for 6 months or longer. Many behaviors done while using nicotine can also play a part in the severity of withdrawal symptoms.  For some people, the feel, smell, and sight of a cigarette and the ritual of obtaining, handling, lighting, and smoking the cigarette are closely linked with the pleasure of smoking. When stopped, they also miss the related behaviors which make the withdrawal or craving worse. While nicotine gum and patches may lessen the drug aspects of withdrawal, cravings often persist. WHAT ARE THE MEDICAL CONSEQUENCES OF NICOTINE USE?  Nicotine addiction accounts for one-third of all cancers. The top cancer caused by tobacco is lung cancer. Lung cancer is the number one cancer killer of both men and women.  Smoking is also associated with cancers of the:  Mouth.  Pharynx.  Larynx.  Esophagus.  Stomach.  Pancreas.  Cervix.  Kidney.  Ureter.  Bladder.  Smoking also causes lung diseases such as lasting (chronic) bronchitis and emphysema.  It worsens asthma in adults and children.  Smoking increases the risk of heart disease, including:  Stroke.  Heart attack.  Vascular disease.  Aneurysm.  Passive or secondary smoke can also increase medical risks including:  Asthma in children.  Sudden Infant Death Syndrome (SIDS).  Additionally, dropped cigarettes are the leading cause of residential fire fatalities.  Nicotine poisoning has been reported from accidental ingestion of tobacco products by children and pets. Death usually results in a few minutes from respiratory failure (when a person stops breathing) caused by paralysis. TREATMENT   Medication. Nicotine replacement medicines such as nicotine gum and the patch are used to stop smoking. These medicines gradually lower the dosage of nicotine in the body. These medicines do not contain the carbon monoxide and other toxins found in tobacco smoke.  Hypnotherapy.  Relaxation  therapy.  Nicotine Anonymous (a 12-step support program). Find times and locations in your local yellow pages. Document Released: 06/20/2004 Document  Revised: 01/07/2012 Document Reviewed: 12/11/2013 [facility] Patient Information 2015 [place]. This information is not intended to replace advice given to you by your health care provider. Make sure you discuss any questions you have with your health care provider.

## 2014-09-13 NOTE — Progress Notes (Signed)
Pre visit review using our clinic review tool, if applicable. No additional management support is needed unless otherwise documented below in the visit note. 

## 2014-09-19 ENCOUNTER — Encounter: Payer: Self-pay | Admitting: Family Medicine

## 2014-09-19 NOTE — Assessment & Plan Note (Signed)
RRR today 

## 2014-09-19 NOTE — Assessment & Plan Note (Signed)
Stable on Celexa and Alprazolam. Presents more as a mixed anxiety depression

## 2014-09-19 NOTE — Progress Notes (Signed)
Jesse Jones  [id number] Dec 27, 1980 09/19/2014      Progress Note-Follow Up  Subjective  Chief Complaint  Chief Complaint  Patient presents with  . Follow-up    3 month  . Injections    flu    HPI  Patient is a 33 y.o. male in today for routine medical care. Doing well. No recent illness. No new or acute concerns. Tolerating meds well. Cigarettes down to roughly 3 a day Encouraged increased hydration, 64 ounces of clear fluids daily. Minimize alcohol and caffeine. Eat small frequent meals with lean proteins and complex carbs. Avoid high and low blood sugars. Get adequate sleep, 7-8 hours a night. Needs exercise daily preferably in the morning. Denies CP/palp/SOB/HA/congestion/fevers/GI or GU c/o. Taking meds as prescribed  Past Medical History  Diagnosis Date  . Anxiety   . Depression   . ADD (attention deficit disorder)   . Insomnia   . Outbursts of anger   . Concussion     X 6- 7  . ADHD (attention deficit hyperactivity disorder) 04/02/2011  . 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 Tobacco abuse 04/28/2011  . Preventative health care 10/03/2011  . ED (erectile dysfunction) 05/12/2012  . 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 Punctured lung    . Toe surgeries  during childhood     for ingrown toenails    Family History  Problem Relation Age of Onset  . Depression Mother   . Anxiety disorder Mother   . Depression Brother   . Cancer Paternal Grandmother     lung/ didn't smoke  . Diabetes Brother     History   Social History  . Marital Status: Single    Spouse Name: N/A    Number of Children: N/A  . Years of Education: N/A   Occupational History  . Not on file.   Social History Main Topics  . Smoking status: Current Every Day Smoker -- 0.30 packs/day    Types: Cigarettes  . Smokeless tobacco: Never Used  . Alcohol Use: 21.6 oz/week    36 Cans of beer per  week     Comment: beer occasionally last drink 6 days ago, uses magic mushrooms 5 times a year- stopped drinking 1.5 months ago  . Drug Use: Yes    Special: Marijuana, Other-see comments  . Sexual Activity:    Partners: Female   Other Topics Concern  . Not on file   Social History Narrative    Current Outpatient Prescriptions on File Prior to Visit  Medication Sig Dispense Refill  . ALPRAZolam (XANAX XR) 3 MG 24 hr tablet TAKE 1 TABLET BY MOUTH EVERY MORNING 30 tablet 2  . cholecalciferol (VITAMIN D) 1000 UNITS tablet Take 1,000 Units by mouth every morning.    . citalopram (CELEXA) 40 MG tablet Take 0.5 tablets (20 mg total) by mouth daily. 15 tablet 5  . fish oil-omega-3 fatty acids 1000 MG capsule Take 2 g by mouth daily.    . fluticasone (FLONASE) 50 MCG/ACT nasal spray Place 2 sprays into both nostrils daily as needed for allergies or rhinitis. 16 g 6  . Multiple Vitamin (MULTIVITAMIN) tablet Take 1 tablet by mouth every morning.     . nebivolol (BYSTOLIC) 2.5 MG tablet Take 1 tablet (2.5 mg total) by mouth daily as needed. palpitations 30 tablet 3  . vitamin B-12 (CYANOCOBALAMIN) 1000 MCG tablet Take 1,000 mcg  by mouth every morning.    . vitamin C (ASCORBIC ACID) 500 MG tablet Take 500 mg by mouth daily.     No current facility-administered medications on file prior to visit.    Allergies  Allergen Reactions  . Haloperidol Decanoate   . Hydroxyzine Anxiety    EXCESSIVE ANXIOUNESS  . Penicillins Hives  . Valium     Review of Systems  Review of Systems  Constitutional: Negative for fever and malaise/fatigue.  HENT: Negative for congestion.   Eyes: Negative for discharge.  Respiratory: Negative for shortness of breath.   Cardiovascular: Negative for chest pain, palpitations and leg swelling.  Gastrointestinal: Negative for nausea, abdominal pain and diarrhea.  Genitourinary: Negative for dysuria.  Musculoskeletal: Negative for falls.  Skin: Negative for rash.    Neurological: Negative for loss of consciousness and headaches.  Endo/Heme/Allergies: Negative for polydipsia.  Psychiatric/Behavioral: Negative for depression and suicidal ideas. The patient is nervous/anxious. The patient does not have insomnia.     Objective  BP 126/76 mmHg  Pulse 86  Temp(Src) 98.2 F (36.8 C) (Oral)  Ht 6\' 2"  (1.88 m)  Wt 154 lb (69.854 kg)  BMI 19.76 kg/m2  SpO2 100%  Physical Exam  Physical Exam  Constitutional: He is oriented to person, place, and time and well-developed, well-nourished, and in no distress. No distress.  HENT:  Head: Normocephalic and atraumatic.  Eyes: Conjunctivae are normal.  Neck: Neck supple. No thyromegaly present.  Cardiovascular: Normal rate, regular rhythm and normal heart sounds.   No murmur heard. Pulmonary/Chest: Effort normal and breath sounds normal. No respiratory distress.  Abdominal: He exhibits no distension and no mass. There is no tenderness.  Musculoskeletal: He exhibits no edema.  Neurological: He is alert and oriented to person, place, and time.  Skin: Skin is warm.  Psychiatric: Memory, affect and judgment normal.    Lab Results  Component Value Date   TSH 2.26 07/07/2012   Lab Results  Component Value Date   WBC 9.4 02/10/2013   HGB 17.1* 02/10/2013   HCT 47.2 02/10/2013   MCV 84.1 02/10/2013   PLT 411* 02/10/2013   Lab Results  Component Value Date   CREATININE 0.85 02/10/2013   BUN 6 02/10/2013   NA 137 02/10/2013   K 3.4* 02/10/2013   CL 99 02/10/2013   CO2 27 02/10/2013   Lab Results  Component Value Date   ALT 16 02/10/2013   AST 19 02/10/2013   ALKPHOS 71 02/10/2013   BILITOT 0.5 02/10/2013   No results found for: CHOL No results found for: HDL No results found for: LDLCALC No results found for: TRIG No results found for: CHOLHDL   Assessment & Plan  Tobacco abuse Down to 3-5 cig daily. Encouraged complete cessation. Discussed need to quit as relates to risk of numerous  cancers, cardiac and pulmonary disease as well as neurologic complications. Counseled for greater than 3 minutes  Alcohol abuse, in remission Continues to abstain  ADHD (attention deficit hyperactivity disorder) Allowed refills on meds  Preventative health care Given flu shot today  Bipolar disorder, mixed Stable on Celexa and Alprazolam. Presents more as a mixed anxiety depression  Tachycardia RRR today

## 2014-09-19 NOTE — Assessment & Plan Note (Signed)
Given flu shot  today 

## 2014-09-19 NOTE — Assessment & Plan Note (Signed)
Down to 3-5 cig daily. Encouraged complete cessation. Discussed need to quit as relates to risk of numerous cancers, cardiac and pulmonary disease as well as neurologic complications. Counseled for greater than 3 minutes

## 2014-09-19 NOTE — Assessment & Plan Note (Signed)
Continues to abstain 

## 2014-09-19 NOTE — Assessment & Plan Note (Signed)
Allowed refills on meds

## 2014-09-28 ENCOUNTER — Other Ambulatory Visit: Payer: Self-pay

## 2014-09-28 DIAGNOSIS — F32A Depression, unspecified: Secondary | ICD-10-CM

## 2014-09-28 DIAGNOSIS — F419 Anxiety disorder, unspecified: Secondary | ICD-10-CM

## 2014-09-28 DIAGNOSIS — F329 Major depressive disorder, single episode, unspecified: Secondary | ICD-10-CM

## 2014-09-28 MED ORDER — ALPRAZOLAM ER 3 MG PO TB24: ORAL_TABLET | ORAL | Status: DC

## 2014-09-28 NOTE — Progress Notes (Signed)
RX printed for md to sign and fax 

## 2014-10-25 ENCOUNTER — Telehealth: Payer: Self-pay | Admitting: Family Medicine

## 2014-10-25 NOTE — Telephone Encounter (Addendum)
Called and spoke with the pt and he is requesting to have his Adderall picked up early.  Pt stated that he is going to pick up his Celexa at the pharmacy and he would like to pick up the Adderall as well.  He did not want to have to make two trips to the pharmacy.  Pt stated that the insurance will allow a 2 day window to picking up prescriptions early.  Informed the pt that I spoke with the pharmacy and then informed Dr. Abner GreenspanBlyth of what the pharmacy said and she agreed to let him pick the prescription up early.  Also informed the pt that the due date for next prescription for (Feb) will not change.  Pt verbalized understanding.//AB/CMA

## 2014-10-25 NOTE — Telephone Encounter (Signed)
Caller name: Isaiah Relation to pt: self Call back number: [phone] Pharmacy:  Reason for call:   Patient requesting a new adderall rx. Requesting this today.

## 2014-12-24 ENCOUNTER — Telehealth: Payer: Self-pay | Admitting: Family Medicine

## 2014-12-24 DIAGNOSIS — F329 Major depressive disorder, single episode, unspecified: Secondary | ICD-10-CM

## 2014-12-24 DIAGNOSIS — F419 Anxiety disorder, unspecified: Secondary | ICD-10-CM

## 2014-12-24 DIAGNOSIS — F988 Other specified behavioral and emotional disorders with onset usually occurring in childhood and adolescence: Secondary | ICD-10-CM

## 2014-12-24 DIAGNOSIS — F32A Depression, unspecified: Secondary | ICD-10-CM

## 2014-12-24 NOTE — Telephone Encounter (Signed)
Caller name:Oshea Kronk Relationship to patient:self Can be [phone] Pharmacy:[facility] [address]  Reason for call:Request refill on adderall(now) and xanax(next week)

## 2014-12-26 NOTE — Telephone Encounter (Signed)
OK to refill both meds for a 3 month supply same sig, same number

## 2014-12-27 MED ORDER — AMPHETAMINE-DEXTROAMPHETAMINE 20 MG PO TABS: 20.0000 mg | ORAL_TABLET | Freq: Two times a day (BID) | ORAL | Status: DC

## 2014-12-27 MED ORDER — AMPHETAMINE-DEXTROAMPHETAMINE 20 MG PO TABS
20.0000 mg | ORAL_TABLET | Freq: Two times a day (BID) | ORAL | Status: DC
Start: 2014-12-27 — End: 2015-04-14

## 2014-12-27 MED ORDER — ALPRAZOLAM ER 3 MG PO TB24: ORAL_TABLET | ORAL | Status: DC

## 2014-12-27 NOTE — Telephone Encounter (Signed)
Printed prescriptions as instructed by PCP.  Put on counter for signature.

## 2014-12-28 NOTE — Telephone Encounter (Signed)
Called the patient left a detailed message that hardcopy's (3 months) of adderall and alprazolam are ready for pickup at the front desk.

## 2014-12-30 ENCOUNTER — Telehealth: Payer: Self-pay | Admitting: *Deleted

## 2014-12-30 NOTE — Telephone Encounter (Signed)
Caller name: Magda KielJanke, Devyn A Relation to pt: self  Call back number: [phone]  Reason for call:  [facility] [id number]

## 2014-12-30 NOTE — Telephone Encounter (Signed)
Called and left detailed message for patient informing him that I need his member ID number for [employer] to complete prior authorization for alprazolam. JG//CMA

## 2015-01-03 NOTE — Telephone Encounter (Signed)
[facility]. Awaiting determination. JG//CMA

## 2015-01-04 NOTE — Telephone Encounter (Signed)
PA denied. Appeal started. JG//CMA 

## 2015-01-06 ENCOUNTER — Telehealth: Payer: Self-pay | Admitting: *Deleted

## 2015-01-06 NOTE — Telephone Encounter (Signed)
Signed medical record release received via mail from [employer]. Forwarded to [place] for scan/fax to medical records. JG//CMA

## 2015-01-17 ENCOUNTER — Other Ambulatory Visit: Payer: Self-pay | Admitting: Family Medicine

## 2015-01-17 NOTE — Telephone Encounter (Signed)
Med filled.  

## 2015-03-04 ENCOUNTER — Encounter: Payer: Self-pay | Admitting: Family Medicine

## 2015-03-25 ENCOUNTER — Telehealth: Payer: Self-pay | Admitting: Family Medicine

## 2015-03-25 DIAGNOSIS — F988 Other specified behavioral and emotional disorders with onset usually occurring in childhood and adolescence: Secondary | ICD-10-CM

## 2015-03-25 DIAGNOSIS — F329 Major depressive disorder, single episode, unspecified: Secondary | ICD-10-CM

## 2015-03-25 DIAGNOSIS — F419 Anxiety disorder, unspecified: Secondary | ICD-10-CM

## 2015-03-25 MED ORDER — AMPHETAMINE-DEXTROAMPHETAMINE 20 MG PO TABS: 20.0000 mg | ORAL_TABLET | Freq: Two times a day (BID) | ORAL | Status: DC

## 2015-03-25 MED ORDER — ALPRAZOLAM ER 3 MG PO TB24: ORAL_TABLET | ORAL | Status: DC

## 2015-03-25 NOTE — Telephone Encounter (Signed)
Caller name:Catcher Relationship to patient:self Can be reached:[phone] [facility]acy  [facility]  Reason for call:adderall 20mg    Alprazolam 3mg  time release

## 2015-03-25 NOTE — Telephone Encounter (Signed)
He can have a 30 day supply of both meds til seen in June

## 2015-03-25 NOTE — Telephone Encounter (Signed)
Patient informed to pickup both (printed on the same sheet) up at the front desk.

## 2015-03-25 NOTE — Telephone Encounter (Signed)
Printed both and on counter for signature by PCP.

## 2015-04-13 ENCOUNTER — Telehealth: Payer: Self-pay | Admitting: *Deleted

## 2015-04-13 NOTE — Telephone Encounter (Signed)
Unable to reach patient at time of Pre-Visit Call.  Left message for patient to return call when available.    

## 2015-04-13 NOTE — Telephone Encounter (Signed)
Pt returning call. Please call tomorrow. Ph# [phone].

## 2015-04-14 ENCOUNTER — Encounter: Payer: Self-pay | Admitting: *Deleted

## 2015-04-14 NOTE — Telephone Encounter (Signed)
Pre-Visit Call completed with patient and chart updated.   Pre-Visit Info documented in Specialty Comments under [employer].    

## 2015-04-14 NOTE — Addendum Note (Signed)
Addended by: Noreene Larsson A on: 04/14/2015 04:52 PM   Modules accepted: Orders, Medications

## 2015-04-14 NOTE — Telephone Encounter (Signed)
Attempted to return call with no answer

## 2015-04-15 ENCOUNTER — Ambulatory Visit (INDEPENDENT_AMBULATORY_CARE_PROVIDER_SITE_OTHER): Payer: Medicare Other | Admitting: Family Medicine

## 2015-04-15 ENCOUNTER — Encounter: Payer: Self-pay | Admitting: Family Medicine

## 2015-04-15 VITALS — BP 112/70 | HR 96 | Temp 97.6°F | Ht 74.0 in | Wt 159.2 lb

## 2015-04-15 DIAGNOSIS — E876 Hypokalemia: Secondary | ICD-10-CM

## 2015-04-15 DIAGNOSIS — R7309 Other abnormal glucose: Secondary | ICD-10-CM | POA: Diagnosis not present

## 2015-04-15 DIAGNOSIS — Z79899 Other long term (current) drug therapy: Secondary | ICD-10-CM | POA: Diagnosis not present

## 2015-04-15 DIAGNOSIS — R739 Hyperglycemia, unspecified: Secondary | ICD-10-CM | POA: Diagnosis not present

## 2015-04-15 DIAGNOSIS — F17210 Nicotine dependence, cigarettes, uncomplicated: Secondary | ICD-10-CM

## 2015-04-15 DIAGNOSIS — F909 Attention-deficit hyperactivity disorder, unspecified type: Secondary | ICD-10-CM | POA: Diagnosis not present

## 2015-04-15 DIAGNOSIS — F418 Other specified anxiety disorders: Secondary | ICD-10-CM | POA: Diagnosis not present

## 2015-04-15 DIAGNOSIS — R000 Tachycardia, unspecified: Secondary | ICD-10-CM | POA: Diagnosis not present

## 2015-04-15 DIAGNOSIS — F17200 Nicotine dependence, unspecified, uncomplicated: Secondary | ICD-10-CM

## 2015-04-15 DIAGNOSIS — Z720 Tobacco use: Secondary | ICD-10-CM | POA: Diagnosis not present

## 2015-04-15 DIAGNOSIS — F419 Anxiety disorder, unspecified: Secondary | ICD-10-CM

## 2015-04-15 DIAGNOSIS — E785 Hyperlipidemia, unspecified: Secondary | ICD-10-CM

## 2015-04-15 DIAGNOSIS — Z0000 Encounter for general adult medical examination without abnormal findings: Secondary | ICD-10-CM | POA: Diagnosis not present

## 2015-04-15 DIAGNOSIS — F900 Attention-deficit hyperactivity disorder, predominantly inattentive type: Secondary | ICD-10-CM | POA: Diagnosis not present

## 2015-04-15 DIAGNOSIS — F988 Other specified behavioral and emotional disorders with onset usually occurring in childhood and adolescence: Secondary | ICD-10-CM

## 2015-04-15 DIAGNOSIS — R710 Precipitous drop in hematocrit: Secondary | ICD-10-CM

## 2015-04-15 DIAGNOSIS — F329 Major depressive disorder, single episode, unspecified: Secondary | ICD-10-CM

## 2015-04-15 DIAGNOSIS — D649 Anemia, unspecified: Secondary | ICD-10-CM | POA: Diagnosis not present

## 2015-04-15 DIAGNOSIS — F411 Generalized anxiety disorder: Secondary | ICD-10-CM

## 2015-04-15 LAB — CBC WITH DIFFERENTIAL/PLATELET
BASOS ABS: 0.1 10*3/uL (ref 0.0–0.1)
BASOS PCT: 1 % (ref 0–1)
EOS ABS: 0.5 10*3/uL (ref 0.0–0.7)
EOS PCT: 7 % — AB (ref 0–5)
HCT: 49 % (ref 39.0–52.0)
Hemoglobin: 17.1 g/dL — ABNORMAL HIGH (ref 13.0–17.0)
Lymphocytes Relative: 37 % (ref 12–46)
Lymphs Abs: 2.6 10*3/uL (ref 0.7–4.0)
MCH: 29.7 pg (ref 26.0–34.0)
MCHC: 34.9 g/dL (ref 30.0–36.0)
MCV: 85.2 fL (ref 78.0–100.0)
MPV: 10.2 fL (ref 8.6–12.4)
Monocytes Absolute: 0.8 10*3/uL (ref 0.1–1.0)
Monocytes Relative: 12 % (ref 3–12)
Neutro Abs: 3 10*3/uL (ref 1.7–7.7)
Neutrophils Relative %: 43 % (ref 43–77)
PLATELETS: 280 10*3/uL (ref 150–400)
RBC: 5.75 MIL/uL (ref 4.22–5.81)
RDW: 14 % (ref 11.5–15.5)
WBC: 6.9 10*3/uL (ref 4.0–10.5)

## 2015-04-15 LAB — LIPID PANEL
Cholesterol: 170 mg/dL (ref 0–200)
HDL: 57 mg/dL (ref >=40)
LDL Cholesterol: 81 mg/dL (ref 0–99)
TRIGLYCERIDES: 158 mg/dL — AB (ref <150)
Total CHOL/HDL Ratio: 3 Ratio
VLDL: 32 mg/dL (ref 0–40)

## 2015-04-15 LAB — COMPREHENSIVE METABOLIC PANEL
ALBUMIN: 5 g/dL (ref 3.5–5.2)
ALT: 35 U/L (ref 0–53)
AST: 26 U/L (ref 0–37)
Alkaline Phosphatase: 77 U/L (ref 39–117)
BUN: 9 mg/dL (ref 6–23)
CHLORIDE: 100 meq/L (ref 96–112)
CO2: 28 meq/L (ref 19–32)
Calcium: 9.9 mg/dL (ref 8.4–10.5)
Creat: 0.98 mg/dL (ref 0.50–1.35)
GLUCOSE: 62 mg/dL — AB (ref 70–99)
Potassium: 4.3 mEq/L (ref 3.5–5.3)
Sodium: 141 mEq/L (ref 135–145)
Total Bilirubin: 0.8 mg/dL (ref 0.2–1.2)
Total Protein: 7.6 g/dL (ref 6.0–8.3)

## 2015-04-15 LAB — TSH: TSH: 1.985 u[IU]/mL (ref 0.350–4.500)

## 2015-04-15 MED ORDER — AMPHETAMINE-DEXTROAMPHETAMINE 20 MG PO TABS: 20.0000 mg | ORAL_TABLET | Freq: Two times a day (BID) | ORAL | Status: DC

## 2015-04-15 MED ORDER — CITALOPRAM HYDROBROMIDE 20 MG PO TABS: 20.0000 mg | ORAL_TABLET | Freq: Every day | ORAL | Status: DC

## 2015-04-15 MED ORDER — ALPRAZOLAM ER 3 MG PO TB24: ORAL_TABLET | ORAL | Status: DC

## 2015-04-15 MED ORDER — AMPHETAMINE-DEXTROAMPHETAMINE 20 MG PO TABS
20.0000 mg | ORAL_TABLET | Freq: Two times a day (BID) | ORAL | Status: DC
Start: 2015-04-15 — End: 2015-04-15

## 2015-04-15 NOTE — Progress Notes (Signed)
Jesse Jones  [id number] October 04, 1981 04/15/2015      Progress Note-Follow Up  Subjective  Chief Complaint  Chief Complaint  Patient presents with  . Annual Exam    HPI  Patient is a 34 y.o. male in today for routine medical care. Pati Denies CP/palp/SOB/HA/congestion/fevers/GI or GU c/o. Taking meds as prescribedent is in today for annual exam. He continues to do well. He has abstained from alcohol for over 2 years. Unfortunately continues to smoke but is now under half a pack. Has infrequent anxiety attacks on his current medications but he does still have them occasionally. No recent illness. Denies fevers chills.  Past Medical History  Diagnosis Date  . Anxiety   . Depression   . ADD (attention deficit disorder)   . Insomnia   . Outbursts of anger   . Concussion     X 6- 7  . ADHD (attention deficit hyperactivity disorder) 04/02/2011  . 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 Tobacco abuse 04/28/2011  . Preventative health care 10/03/2011  . ED (erectile dysfunction) 05/12/2012  . 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 Punctured lung    . Toe surgeries  during childhood     for ingrown toenails    Family History  Problem Relation Age of Onset  . Depression Mother   . Anxiety disorder Mother   . Depression Brother   . Cancer Paternal Grandmother     lung/ didn't smoke  . Diabetes Brother     History   Social History  . Marital Status: Single    Spouse Name: N/A  . Number of Children: N/A  . Years of Education: N/A   Occupational History  . Not on file.   Social History Main Topics  . Smoking status: Current Every Day Smoker -- 0.30 packs/day    Types: Cigarettes  . Smokeless tobacco: Never Used  . Alcohol Use: 21.6 oz/week    36 Cans of beer per week     Comment: beer occasionally last drink 6 days ago, uses magic mushrooms 5 times a year- stopped drinking  1.5 months ago  . Drug Use: Yes    Special: Marijuana, Other-see comments  . Sexual Activity:    Partners: Female   Other Topics Concern  . Not on file   Social History Narrative    Current Outpatient Prescriptions on File Prior to Visit  Medication Sig Dispense Refill  . ALPRAZolam (XANAX XR) 3 MG 24 hr tablet TAKE 1 TABLET BY MOUTH EVERY MORNING 30 tablet 0  . amphetamine-dextroamphetamine (ADDERALL) 20 MG tablet Take 1 tablet (20 mg total) by mouth 2 (two) times daily. March 2016 rx 60 tablet 0  . cholecalciferol (VITAMIN D) 1000 UNITS tablet Take 1,000 Units by mouth every morning.    . citalopram (CELEXA) 40 MG tablet Take 1 tablet (40 mg total) by mouth daily. 15 tablet 3  . fish oil-omega-3 fatty acids 1000 MG capsule Take 2 g by mouth daily.    . fluticasone (FLONASE) 50 MCG/ACT nasal spray Place 2 sprays into both nostrils daily as needed for allergies or rhinitis. 16 g 6  . Multiple Vitamin (MULTIVITAMIN) tablet Take 1 tablet by mouth every morning.     . nebivolol (BYSTOLIC) 2.5 MG tablet Take 1 tablet (2.5 mg total) by mouth daily as needed. palpitations 30 tablet 3  . vitamin B-12 (CYANOCOBALAMIN) 1000 MCG tablet  Take 1,000 mcg by mouth every morning.    . vitamin C (ASCORBIC ACID) 500 MG tablet Take 500 mg by mouth daily.     No current facility-administered medications on file prior to visit.    Allergies  Allergen Reactions  . Haloperidol Decanoate   . Hydroxyzine Anxiety    EXCESSIVE ANXIOUNESS  . Penicillins Hives  . Valium     Review of Systems  Review of Systems  Constitutional: Negative for fever, chills and malaise/fatigue.  HENT: Negative for congestion, hearing loss and nosebleeds.   Eyes: Negative for discharge.  Respiratory: Negative for cough, sputum production, shortness of breath and wheezing.   Cardiovascular: Negative for chest pain, palpitations and leg swelling.  Gastrointestinal: Negative for heartburn, nausea, vomiting, abdominal pain,  diarrhea, constipation and blood in stool.  Genitourinary: Negative for dysuria, urgency, frequency and hematuria.  Musculoskeletal: Negative for myalgias, back pain and falls.  Skin: Negative for rash.  Neurological: Negative for dizziness, tremors, sensory change, focal weakness, loss of consciousness, weakness and headaches.  Endo/Heme/Allergies: Negative for polydipsia. Does not bruise/bleed easily.  Psychiatric/Behavioral: Negative for depression and suicidal ideas. The patient is nervous/anxious. The patient does not have insomnia.     Objective  BP 112/70 mmHg  Pulse 115  Temp(Src) 97.6 F (36.4 C) (Oral)  Ht 6\' 2"  (1.88 m)  Wt 159 lb 4 oz (72.235 kg)  BMI 20.44 kg/m2  SpO2 99%  Physical Exam  Physical Exam  Constitutional: He is oriented to person, place, and time and well-developed, well-nourished, and in no distress. No distress.  HENT:  Head: Normocephalic and atraumatic.  Eyes: Conjunctivae are normal.  Neck: Neck supple. No thyromegaly present.  Cardiovascular: Normal rate, regular rhythm and normal heart sounds.   No murmur heard. Pulmonary/Chest: Effort normal and breath sounds normal. No respiratory distress.  Abdominal: He exhibits no distension and no mass. There is no tenderness.  Musculoskeletal: He exhibits no edema.  Congenital deformity of left hand, missing fingers  Neurological: He is alert and oriented to person, place, and time.  Skin: Skin is warm.  Psychiatric: Memory, affect and judgment normal.    Lab Results  Component Value Date   TSH 2.26 07/07/2012   Lab Results  Component Value Date   WBC 9.4 02/10/2013   HGB 17.1* 02/10/2013   HCT 47.2 02/10/2013   MCV 84.1 02/10/2013   PLT 411* 02/10/2013   Lab Results  Component Value Date   CREATININE 0.85 02/10/2013   BUN 6 02/10/2013   NA 137 02/10/2013   K 3.4* 02/10/2013   CL 99 02/10/2013   CO2 27 02/10/2013   Lab Results  Component Value Date   ALT 16 02/10/2013   AST 19  02/10/2013   ALKPHOS 71 02/10/2013   BILITOT 0.5 02/10/2013     Assessment & Plan  Preventative health care Patient encouraged to maintain heart healthy diet, regular exercise, adequate sleep. Consider daily probiotics. Take medications as prescribed. Check annual labs  Alcohol abuse, in remission No alcohol for 2 years and 2 months  Tobacco abuse Encouraged complete cessation. Discussed need to quit as relates to risk of numerous cancers, cardiac and pulmonary disease as well as neurologic complications. Counseled for greater than 3 minutes  Tachycardia Smoked a cigarette and drank a monster drink prior to visit encouraged not to do that routinely and we will recheck at next visit.   GAD (generalized anxiety disorder) Stable on current meds, no changes  Hyperlipidemia, mild Encouraged heart healthy diet,  increase exercise, avoid trans fats, consider a krill oil cap daily

## 2015-04-15 NOTE — Assessment & Plan Note (Signed)
No alcohol for 2 years and 2 months

## 2015-04-15 NOTE — Assessment & Plan Note (Signed)
Patient encouraged to maintain heart healthy diet, regular exercise, adequate sleep. Consider daily probiotics. Take medications as prescribed. Check annual labs

## 2015-04-15 NOTE — Patient Instructions (Signed)
Preventive Care for Adults A healthy lifestyle and preventive care can promote health and wellness. Preventive health guidelines for men include the following key practices:  A routine yearly physical is a good way to check with your health care provider about your health and preventative screening. It is a chance to share any concerns and updates on your health and to receive a thorough exam.  Visit your dentist for a routine exam and preventative care every 6 months. Brush your teeth twice a day and floss once a day. Good oral hygiene prevents tooth decay and gum disease.  The frequency of eye exams is based on your age, health, family medical history, use of contact lenses, and other factors. Follow your health care provider's recommendations for frequency of eye exams.  Eat a healthy diet. Foods such as vegetables, fruits, whole grains, low-fat dairy products, and lean protein foods contain the nutrients you need without too many calories. Decrease your intake of foods high in solid fats, added sugars, and salt. Eat the right amount of calories for you.Get information about a proper diet from your health care provider, if necessary.  Regular physical exercise is one of the most important things you can do for your health. Most adults should get at least 150 minutes of moderate-intensity exercise (any activity that increases your heart rate and causes you to sweat) each week. In addition, most adults need muscle-strengthening exercises on 2 or more days a week.  Maintain a healthy weight. The body mass index (BMI) is a screening tool to identify possible weight problems. It provides an estimate of body fat based on height and weight. Your health care provider can find your BMI and can help you achieve or maintain a healthy weight.For adults 20 years and older:  A BMI below 18.5 is considered underweight.  A BMI of 18.5 to 24.9 is normal.  A BMI of 25 to 29.9 is considered overweight.  A BMI  of 30 and above is considered obese.  Maintain normal blood lipids and cholesterol levels by exercising and minimizing your intake of saturated fat. Eat a balanced diet with plenty of fruit and vegetables. Blood tests for lipids and cholesterol should begin at age 50 and be repeated every 5 years. If your lipid or cholesterol levels are high, you are over 50, or you are at high risk for heart disease, you may need your cholesterol levels checked more frequently.Ongoing high lipid and cholesterol levels should be treated with medicines if diet and exercise are not working.  If you smoke, find out from your health care provider how to quit. If you do not use tobacco, do not start.  Lung cancer screening is recommended for adults aged 73-80 years who are at high risk for developing lung cancer because of a history of smoking. A yearly low-dose CT scan of the lungs is recommended for people who have at least a 30-pack-year history of smoking and are a current smoker or have quit within the past 15 years. A pack year of smoking is smoking an average of 1 pack of cigarettes a day for 1 year (for example: 1 pack a day for 30 years or 2 packs a day for 15 years). Yearly screening should continue until the smoker has stopped smoking for at least 15 years. Yearly screening should be stopped for people who develop a health problem that would prevent them from having lung cancer treatment.  If you choose to drink alcohol, do not have more than  2 drinks per day. One drink is considered to be 12 ounces (355 mL) of beer, 5 ounces (148 mL) of wine, or 1.5 ounces (44 mL) of liquor.  Avoid use of street drugs. Do not share needles with anyone. Ask for help if you need support or instructions about stopping the use of drugs.  High blood pressure causes heart disease and increases the risk of stroke. Your blood pressure should be checked at least every 1-2 years. Ongoing high blood pressure should be treated with  medicines, if weight loss and exercise are not effective.  If you are 45-79 years old, ask your health care provider if you should take aspirin to prevent heart disease.  Diabetes screening involves taking a blood sample to check your fasting blood sugar level. This should be done once every 3 years, after age 45, if you are within normal weight and without risk factors for diabetes. Testing should be considered at a younger age or be carried out more frequently if you are overweight and have at least 1 risk factor for diabetes.  Colorectal cancer can be detected and often prevented. Most routine colorectal cancer screening begins at the age of 50 and continues through age 75. However, your health care provider may recommend screening at an earlier age if you have risk factors for colon cancer. On a yearly basis, your health care provider may provide home test kits to check for hidden blood in the stool. Use of a small camera at the end of a tube to directly examine the colon (sigmoidoscopy or colonoscopy) can detect the earliest forms of colorectal cancer. Talk to your health care provider about this at age 50, when routine screening begins. Direct exam of the colon should be repeated every 5-10 years through age 75, unless early forms of precancerous polyps or small growths are found.  People who are at an increased risk for hepatitis B should be screened for this virus. You are considered at high risk for hepatitis B if:  You were born in a country where hepatitis B occurs often. Talk with your health care provider about which countries are considered high risk.  Your parents were born in a high-risk country and you have not received a shot to protect against hepatitis B (hepatitis B vaccine).  You have HIV or AIDS.  You use needles to inject street drugs.  You live with, or have sex with, someone who has hepatitis B.  You are a man who has sex with other men (MSM).  You get hemodialysis  treatment.  You take certain medicines for conditions such as cancer, organ transplantation, and autoimmune conditions.  Hepatitis C blood testing is recommended for all people born from 1945 through 1965 and any individual with known risks for hepatitis C.  Practice safe sex. Use condoms and avoid high-risk sexual practices to reduce the spread of sexually transmitted infections (STIs). STIs include gonorrhea, chlamydia, syphilis, trichomonas, herpes, HPV, and human immunodeficiency virus (HIV). Herpes, HIV, and HPV are viral illnesses that have no cure. They can result in disability, cancer, and death.  If you are at risk of being infected with HIV, it is recommended that you take a prescription medicine daily to prevent HIV infection. This is called preexposure prophylaxis (PrEP). You are considered at risk if:  You are a man who has sex with other men (MSM) and have other risk factors.  You are a heterosexual man, are sexually active, and are at increased risk for HIV infection.    You take drugs by injection.  You are sexually active with a partner who has HIV.  Talk with your health care provider about whether you are at high risk of being infected with HIV. If you choose to begin PrEP, you should first be tested for HIV. You should then be tested every 3 months for as long as you are taking PrEP.  A one-time screening for abdominal aortic aneurysm (AAA) and surgical repair of large AAAs by ultrasound are recommended for men ages 32 to 67 years who are current or former smokers.  Healthy men should no longer receive prostate-specific antigen (PSA) blood tests as part of routine cancer screening. Talk with your health care provider about prostate cancer screening.  Testicular cancer screening is not recommended for adult males who have no symptoms. Screening includes self-exam, a health care provider exam, and other screening tests. Consult with your health care provider about any symptoms  you have or any concerns you have about testicular cancer.  Use sunscreen. Apply sunscreen liberally and repeatedly throughout the day. You should seek shade when your shadow is shorter than you. Protect yourself by wearing long sleeves, pants, a wide-brimmed hat, and sunglasses year round, whenever you are outdoors.  Once a month, do a whole-body skin exam, using a mirror to look at the skin on your back. Tell your health care provider about new moles, moles that have irregular borders, moles that are larger than a pencil eraser, or moles that have changed in shape or color.  Stay current with required vaccines (immunizations).  Influenza vaccine. All adults should be immunized every year.  Tetanus, diphtheria, and acellular pertussis (Td, Tdap) vaccine. An adult who has not previously received Tdap or who does not know his vaccine status should receive 1 dose of Tdap. This initial dose should be followed by tetanus and diphtheria toxoids (Td) booster doses every 10 years. Adults with an unknown or incomplete history of completing a 3-dose immunization series with Td-containing vaccines should begin or complete a primary immunization series including a Tdap dose. Adults should receive a Td booster every 10 years.  Varicella vaccine. An adult without evidence of immunity to varicella should receive 2 doses or a second dose if he has previously received 1 dose.  Human papillomavirus (HPV) vaccine. Males aged 68-21 years who have not received the vaccine previously should receive the 3-dose series. Males aged 22-26 years may be immunized. Immunization is recommended through the age of 6 years for any male who has sex with males and did not get any or all doses earlier. Immunization is recommended for any person with an immunocompromised condition through the age of 49 years if he did not get any or all doses earlier. During the 3-dose series, the second dose should be obtained 4-8 weeks after the first  dose. The third dose should be obtained 24 weeks after the first dose and 16 weeks after the second dose.  Zoster vaccine. One dose is recommended for adults aged 50 years or older unless certain conditions are present.  Measles, mumps, and rubella (MMR) vaccine. Adults born before [birth year] generally are considered immune to measles and mumps. Adults born in [birth year] or later should have 1 or more doses of MMR vaccine unless there is a contraindication to the vaccine or there is laboratory evidence of immunity to each of the three diseases. A routine second dose of MMR vaccine should be obtained at least 28 days after the first dose for students attending postsecondary  schools, health care workers, or international travelers. People who received inactivated measles vaccine or an unknown type of measles vaccine during 1963-1967 should receive 2 doses of MMR vaccine. People who received inactivated mumps vaccine or an unknown type of mumps vaccine before 1979 and are at high risk for mumps infection should consider immunization with 2 doses of MMR vaccine. Unvaccinated health care workers born before 1957 who lack laboratory evidence of measles, mumps, or rubella immunity or laboratory confirmation of disease should consider measles and mumps immunization with 2 doses of MMR vaccine or rubella immunization with 1 dose of MMR vaccine.  Pneumococcal 13-valent conjugate (PCV13) vaccine. When indicated, a person who is uncertain of his immunization history and has no record of immunization should receive the PCV13 vaccine. An adult aged 19 years or older who has certain medical conditions and has not been previously immunized should receive 1 dose of PCV13 vaccine. This PCV13 should be followed with a dose of pneumococcal polysaccharide (PPSV23) vaccine. The PPSV23 vaccine dose should be obtained at least 8 weeks after the dose of PCV13 vaccine. An adult aged 19 years or older who has certain medical conditions and  previously received 1 or more doses of PPSV23 vaccine should receive 1 dose of PCV13. The PCV13 vaccine dose should be obtained 1 or more years after the last PPSV23 vaccine dose.  Pneumococcal polysaccharide (PPSV23) vaccine. When PCV13 is also indicated, PCV13 should be obtained first. All adults aged 65 years and older should be immunized. An adult younger than age 65 years who has certain medical conditions should be immunized. Any person who resides in a nursing home or long-term care facility should be immunized. An adult smoker should be immunized. People with an immunocompromised condition and certain other conditions should receive both PCV13 and PPSV23 vaccines. People with human immunodeficiency virus (HIV) infection should be immunized as soon as possible after diagnosis. Immunization during chemotherapy or radiation therapy should be avoided. Routine use of PPSV23 vaccine is not recommended for American Indians, [place] Natives, or people younger than 65 years unless there are medical conditions that require PPSV23 vaccine. When indicated, people who have unknown immunization and have no record of immunization should receive PPSV23 vaccine. One-time revaccination 5 years after the first dose of PPSV23 is recommended for people aged 19-64 years who have chronic kidney failure, nephrotic syndrome, asplenia, or immunocompromised conditions. People who received 1-2 doses of PPSV23 before age 65 years should receive another dose of PPSV23 vaccine at age 65 years or later if at least 5 years have passed since the previous dose. Doses of PPSV23 are not needed for people immunized with PPSV23 at or after age 65 years.  Meningococcal vaccine. Adults with asplenia or persistent complement component deficiencies should receive 2 doses of quadrivalent meningococcal conjugate (MenACWY-D) vaccine. The doses should be obtained at least 2 months apart. Microbiologists working with certain meningococcal bacteria,  military recruits, people at risk during an outbreak, and people who travel to or live in countries with a high rate of meningitis should be immunized. A first-year college student up through age 21 years who is living in a residence hall should receive a dose if he did not receive a dose on or after his 16th birthday. Adults who have certain high-risk conditions should receive one or more doses of vaccine.  Hepatitis A vaccine. Adults who wish to be protected from this disease, have certain high-risk conditions, work with hepatitis A-infected animals, work in hepatitis A research labs, or   travel to or work in countries with a high rate of hepatitis A should be immunized. Adults who were previously unvaccinated and who anticipate close contact with an international adoptee during the first 60 days after arrival in the [place] from a country with a high rate of hepatitis A should be immunized.  Hepatitis B vaccine. Adults should be immunized if they wish to be protected from this disease, have certain high-risk conditions, may be exposed to blood or other infectious body fluids, are household contacts or sex partners of hepatitis B positive people, are clients or workers in certain care facilities, or travel to or work in countries with a high rate of hepatitis B.  Haemophilus influenzae type b (Hib) vaccine. A previously unvaccinated person with asplenia or sickle cell disease or having a scheduled splenectomy should receive 1 dose of Hib vaccine. Regardless of previous immunization, a recipient of a hematopoietic stem cell transplant should receive a 3-dose series 6-12 months after his successful transplant. Hib vaccine is not recommended for adults with HIV infection. Preventive Service / Frequency Ages 52 to 17  Blood pressure check.** / Every 1 to 2 years.  Lipid and cholesterol check.** / Every 5 years beginning at age 69.  Hepatitis C blood test.** / For any individual with known risks for  hepatitis C.  Skin self-exam. / Monthly.  Influenza vaccine. / Every year.  Tetanus, diphtheria, and acellular pertussis (Tdap, Td) vaccine.** / Consult your health care provider. 1 dose of Td every 10 years.  Varicella vaccine.** / Consult your health care provider.  HPV vaccine. / 3 doses over 6 months, if 72 or younger.  Measles, mumps, rubella (MMR) vaccine.** / You need at least 1 dose of MMR if you were born in 1957 or later. You may also need a second dose.  Pneumococcal 13-valent conjugate (PCV13) vaccine.** / Consult your health care provider.  Pneumococcal polysaccharide (PPSV23) vaccine.** / 1 to 2 doses if you smoke cigarettes or if you have certain conditions.  Meningococcal vaccine.** / 1 dose if you are age 35 to 60 years and a Market researcher living in a residence hall, or have one of several medical conditions. You may also need additional booster doses.  Hepatitis A vaccine.** / Consult your health care provider.  Hepatitis B vaccine.** / Consult your health care provider.  Haemophilus influenzae type b (Hib) vaccine.** / Consult your health care provider. Ages 35 to 8  Blood pressure check.** / Every 1 to 2 years.  Lipid and cholesterol check.** / Every 5 years beginning at age 57.  Lung cancer screening. / Every year if you are aged 44-80 years and have a 30-pack-year history of smoking and currently smoke or have quit within the past 15 years. Yearly screening is stopped once you have quit smoking for at least 15 years or develop a health problem that would prevent you from having lung cancer treatment.  Fecal occult blood test (FOBT) of stool. / Every year beginning at age 55 and continuing until age 73. You may not have to do this test if you get a colonoscopy every 10 years.  Flexible sigmoidoscopy** or colonoscopy.** / Every 5 years for a flexible sigmoidoscopy or every 10 years for a colonoscopy beginning at age 28 and continuing until age  1.  Hepatitis C blood test.** / For all people born from [birth year] through 1965 and any individual with known risks for hepatitis C.  Skin self-exam. / Monthly.  Influenza vaccine. / Every  year.  Tetanus, diphtheria, and acellular pertussis (Tdap/Td) vaccine.** / Consult your health care provider. 1 dose of Td every 10 years.  Varicella vaccine.** / Consult your health care provider.  Zoster vaccine.** / 1 dose for adults aged 53 years or older.  Measles, mumps, rubella (MMR) vaccine.** / You need at least 1 dose of MMR if you were born in 1957 or later. You may also need a second dose.  Pneumococcal 13-valent conjugate (PCV13) vaccine.** / Consult your health care provider.  Pneumococcal polysaccharide (PPSV23) vaccine.** / 1 to 2 doses if you smoke cigarettes or if you have certain conditions.  Meningococcal vaccine.** / Consult your health care provider.  Hepatitis A vaccine.** / Consult your health care provider.  Hepatitis B vaccine.** / Consult your health care provider.  Haemophilus influenzae type b (Hib) vaccine.** / Consult your health care provider. Ages 77 and over  Blood pressure check.** / Every 1 to 2 years.  Lipid and cholesterol check.**/ Every 5 years beginning at age 85.  Lung cancer screening. / Every year if you are aged 55-80 years and have a 30-pack-year history of smoking and currently smoke or have quit within the past 15 years. Yearly screening is stopped once you have quit smoking for at least 15 years or develop a health problem that would prevent you from having lung cancer treatment.  Fecal occult blood test (FOBT) of stool. / Every year beginning at age 33 and continuing until age 11. You may not have to do this test if you get a colonoscopy every 10 years.  Flexible sigmoidoscopy** or colonoscopy.** / Every 5 years for a flexible sigmoidoscopy or every 10 years for a colonoscopy beginning at age 28 and continuing until age 73.  Hepatitis C blood  test.** / For all people born from [birth year] through 1965 and any individual with known risks for hepatitis C.  Abdominal aortic aneurysm (AAA) screening.** / A one-time screening for ages 50 to 27 years who are current or former smokers.  Skin self-exam. / Monthly.  Influenza vaccine. / Every year.  Tetanus, diphtheria, and acellular pertussis (Tdap/Td) vaccine.** / 1 dose of Td every 10 years.  Varicella vaccine.** / Consult your health care provider.  Zoster vaccine.** / 1 dose for adults aged 34 years or older.  Pneumococcal 13-valent conjugate (PCV13) vaccine.** / Consult your health care provider.  Pneumococcal polysaccharide (PPSV23) vaccine.** / 1 dose for all adults aged 63 years and older.  Meningococcal vaccine.** / Consult your health care provider.  Hepatitis A vaccine.** / Consult your health care provider.  Hepatitis B vaccine.** / Consult your health care provider.  Haemophilus influenzae type b (Hib) vaccine.** / Consult your health care provider. **Family history and personal history of risk and conditions may change your health care provider's recommendations. Document Released: 12/11/2001 Document Revised: 10/20/2013 Document Reviewed: 03/12/2011 [facility] Patient Information 2015 [place], [place]. This information is not intended to replace advice given to you by your health care provider. Make sure you discuss any questions you have with your health care provider.

## 2015-04-15 NOTE — Progress Notes (Signed)
Pre visit review using our clinic review tool, if applicable. No additional management support is needed unless otherwise documented below in the visit note. 

## 2015-04-18 ENCOUNTER — Encounter: Payer: Self-pay | Admitting: Family Medicine

## 2015-04-18 DIAGNOSIS — E785 Hyperlipidemia, unspecified: Secondary | ICD-10-CM | POA: Insufficient documentation

## 2015-04-18 HISTORY — DX: Hyperlipidemia, unspecified: E78.5

## 2015-04-18 NOTE — Assessment & Plan Note (Signed)
Smoked a cigarette and drank a monster drink prior to visit encouraged not to do that routinely and we will recheck at next visit.

## 2015-04-18 NOTE — Assessment & Plan Note (Signed)
Encouraged heart healthy diet, increase exercise, avoid trans fats, consider a krill oil cap daily 

## 2015-04-18 NOTE — Assessment & Plan Note (Signed)
Stable on current meds, no changes. 

## 2015-04-18 NOTE — Assessment & Plan Note (Signed)
Encouraged complete cessation. Discussed need to quit as relates to risk of numerous cancers, cardiac and pulmonary disease as well as neurologic complications. Counseled for greater than 3 minutes 

## 2015-07-15 ENCOUNTER — Ambulatory Visit: Payer: Self-pay | Admitting: Family Medicine

## 2015-07-21 ENCOUNTER — Telehealth: Payer: Self-pay | Admitting: Family Medicine

## 2015-07-21 DIAGNOSIS — F988 Other specified behavioral and emotional disorders with onset usually occurring in childhood and adolescence: Secondary | ICD-10-CM

## 2015-07-21 NOTE — Telephone Encounter (Signed)
He can have a sept, oct and nov rx for his Adderall please print

## 2015-07-21 NOTE — Telephone Encounter (Signed)
Caller name: Gevon Markus  Relationship to patient: Self  Can be reached: [phone] Pharmacy:  Reason for call: pt says that he was told by pharmacy to get a hard copy of his ADDERALL Rx.  Pt will be in to pick it up tomorrow.

## 2015-07-21 NOTE — Telephone Encounter (Signed)
Requesting: Adderall Contract  04/15/15 UDS  Moderate Last OV   04/15/15 Last Refill    04/15/15  #[phone] with 0 refills  Please Advise

## 2015-07-22 MED ORDER — AMPHETAMINE-DEXTROAMPHETAMINE 20 MG PO TABS: 20.0000 mg | ORAL_TABLET | Freq: Two times a day (BID) | ORAL | Status: DC

## 2015-07-22 NOTE — Telephone Encounter (Signed)
Printed as instructed by PCP for September, October and November and on counter for signature. Once signed called the patient left a detailed message to pickup hardcopy's at the front desk.

## 2015-08-01 DIAGNOSIS — H52203 Unspecified astigmatism, bilateral: Secondary | ICD-10-CM | POA: Diagnosis not present

## 2015-08-01 DIAGNOSIS — H5310 Unspecified subjective visual disturbances: Secondary | ICD-10-CM | POA: Diagnosis not present

## 2015-08-01 DIAGNOSIS — H5213 Myopia, bilateral: Secondary | ICD-10-CM | POA: Diagnosis not present

## 2015-08-05 ENCOUNTER — Encounter: Payer: Self-pay | Admitting: Family Medicine

## 2015-08-05 ENCOUNTER — Ambulatory Visit (INDEPENDENT_AMBULATORY_CARE_PROVIDER_SITE_OTHER): Payer: Medicare Other | Admitting: Family Medicine

## 2015-08-05 VITALS — BP 120/80 | HR 86 | Temp 97.6°F | Ht 73.0 in | Wt 156.2 lb

## 2015-08-05 DIAGNOSIS — Z23 Encounter for immunization: Secondary | ICD-10-CM | POA: Diagnosis not present

## 2015-08-05 DIAGNOSIS — R0981 Nasal congestion: Secondary | ICD-10-CM

## 2015-08-05 DIAGNOSIS — F1011 Alcohol abuse, in remission: Secondary | ICD-10-CM

## 2015-08-05 DIAGNOSIS — E785 Hyperlipidemia, unspecified: Secondary | ICD-10-CM | POA: Diagnosis not present

## 2015-08-05 DIAGNOSIS — Z720 Tobacco use: Secondary | ICD-10-CM

## 2015-08-05 DIAGNOSIS — F1010 Alcohol abuse, uncomplicated: Secondary | ICD-10-CM

## 2015-08-05 DIAGNOSIS — R000 Tachycardia, unspecified: Secondary | ICD-10-CM

## 2015-08-05 DIAGNOSIS — F908 Attention-deficit hyperactivity disorder, other type: Secondary | ICD-10-CM

## 2015-08-05 DIAGNOSIS — J342 Deviated nasal septum: Secondary | ICD-10-CM

## 2015-08-05 NOTE — Patient Instructions (Signed)
Tobacco Use Disorder Tobacco use disorder (TUD) is a mental disorder. It is the long-term use of tobacco in spite of related health problems or difficulty with normal life activities. Tobacco is most commonly smoked as cigarettes and less commonly as cigars or pipes. Smokeless chewing tobacco and snuff are also popular. People with TUD get a feeling of extreme pleasure (euphoria) from using tobacco and have a desire to use it again and again. Repeated use of tobacco can cause problems. The addictive effects of tobacco are due mainly tothe ingredient nicotine. Nicotine also causes a rush of adrenaline (epinephrine) in the body. This leads to increased blood pressure, heart rate, and breathing rate. These changes may cause problems for people with high blood pressure, weak hearts, or lung disease. High doses of nicotine in children and pets can lead to seizures and death.  Tobacco contains a number of other unsafe chemicals. These chemicals are especially harmful when inhaled as smoke and can damage almost every organ in the body. Smokers live shorter lives than nonsmokers and are at risk of dying from a number of diseases and cancers. Tobacco smoke can also cause health problems for nonsmokers (due to inhaling secondhand smoke). Smoking is also a fire hazard.  TUD usually starts in the late teenage years and is most common in young adults between the ages of 18 and 25 years. People who start smoking earlier in life are more likely to continue smoking as adults. TUD is somewhat more common in men than women. People with TUD are at higher risk for using alcohol and other drugs of abuse. RISK FACTORS Risk factors for TUD include:   Having family members with the disorder.  Being around people who use tobacco.  Having an existing mental health issue such as schizophrenia, depression, bipolar disorder, ADHD, or posttraumatic stress disorder (PTSD). SIGNS AND SYMPTOMS  People with tobacco use disorder have  two or more of the following signs and symptoms within 12 months:   Use of more tobacco over a longer period than intended.   Not able to cut down or control tobacco use.   A lot of time spent obtaining or using tobacco.   Strong desire or urge to use tobacco (craving). Cravings may last for 6 months or longer after quitting.  Use of tobacco even when use leads to major problems at work, school, or home.   Use of tobacco even when use leads to relationship problems.   Giving up or cutting down on important life activities because of tobacco use.   Repeatedly using tobacco in situations where it puts you or others in physical danger, like smoking in bed.   Use of tobacco even when it is known that a physical or mental problem is likely related to tobacco use.   Physical problems are numerous and may include chronic bronchitis, emphysema, lung and other cancers, gum disease, high blood pressure, heart disease, and stroke.   Mental problems caused by tobacco may include difficulty sleeping and anxiety.  Need to use greater amounts of tobacco to get the same effect. This means you have developed a tolerance.   Withdrawal symptoms as a result of stopping or rapidly cutting back use. These symptoms may last a month or more after quitting and include the following:   Depressed, anxious, or irritable mood.   Difficulty concentrating.   Increased appetite.  Restlessness or trouble sleeping.   Use of tobacco to avoid withdrawal symptoms. DIAGNOSIS  Tobacco use disorder is diagnosed by   your health care provider. A diagnosis may be made by:  Your health care provider asking questions about your tobacco use and any problems it may be causing.  A physical exam.  Lab tests.  You may be referred to a mental health professional or addiction specialist. The severity of tobacco use disorder depends on the number of signs and symptoms you have:   Mild--Two or three  symptoms.  Moderate--Four or five symptoms.   Severe--Six or more symptoms.  TREATMENT  Many people with tobacco use disorder are unable to quit on their own and need help. Treatment options include the following:  Nicotine replacement therapy (NRT). NRT provides nicotine without the other harmful chemicals in tobacco. NRT gradually lowers the dosage of nicotine in the body and reduces withdrawal symptoms. NRT is available in over-the-counter forms (gum, lozenges, and skin patches) as well as prescription forms (mouth inhaler and nasal spray).  Medicines.This may include:  Antidepressant medicine that may reduce nicotine cravings.  A medicine that acts on nicotine receptors in the brain to reduce cravings and withdrawal symptoms. It may also block the effects of tobacco in people with TUD who relapse.  Counseling or talk therapy. A form of talk therapy called behavioral therapy is commonly used to treat people with TUD. Behavioral therapy looks at triggers for tobacco use, how to avoid them, and how to cope with cravings. It is most effective in person or by phone but is also available in self-help forms (books and Internet websites).  Support groups. These provide emotional support, advice, and guidance for quitting tobacco. The most effective treatment for TUD is usually a combination of medicine, talk therapy, and support groups. HOME CARE INSTRUCTIONS  Keep all follow-up visits as directed by your health care provider. This is important.  Take medicines only as directed by your health care provider.  Check with your health care provider before starting new prescription or over-the-counter medicines. SEEK MEDICAL CARE IF:  You are not able to take your medicines as prescribed.  Treatment is not helping your TUD and your symptoms get worse. SEEK IMMEDIATE MEDICAL CARE IF:  You have serious thoughts about hurting yourself or others.  You have trouble breathing, chest pain,  sudden weakness, or sudden numbness in part of your body.   This information is not intended to replace advice given to you by your health care provider. Make sure you discuss any questions you have with your health care provider.   Document Released: 06/20/2004 Document Revised: 11/05/2014 Document Reviewed: 12/11/2013 Elsevier Interactive Patient Education 2016 [employer]  

## 2015-08-05 NOTE — Progress Notes (Signed)
Pre visit review using our clinic review tool, if applicable. No additional management support is needed unless otherwise documented below in the visit note. 

## 2015-08-13 ENCOUNTER — Encounter: Payer: Self-pay | Admitting: Family Medicine

## 2015-08-13 DIAGNOSIS — J342 Deviated nasal septum: Secondary | ICD-10-CM

## 2015-08-13 HISTORY — DX: Deviated nasal septum: J34.2

## 2015-08-13 NOTE — Assessment & Plan Note (Signed)
Continues to refrain and is doing well.

## 2015-08-13 NOTE — Assessment & Plan Note (Signed)
Congestion, eustachian dysfunction, referred to ENT for further evaluation

## 2015-08-13 NOTE — Assessment & Plan Note (Signed)
Encouraged complete cessation. Discussed need to quit as relates to risk of numerous cancers, cardiac and pulmonary disease as well as neurologic complications. Counseled for greater than 3 minutes 

## 2015-08-13 NOTE — Progress Notes (Signed)
Subjective:    Patient ID: Jesse Jones, male    DOB: [birth year], 34 y.o.   MRN: [id number]  Chief Complaint  Patient presents with  . Follow-up    HPI Patient is in today for follow-up. Doing well. Has been struggling with some mild nasal congestion most notably on the left. His astigmatism in his vision is getting worse but no significant visual loss. No recent illness. No acute concerns. Denies CP/palp/SOB/HA/congestion/fevers/GI or GU c/o. Taking meds as prescribed  Past Medical History  Diagnosis Date  . Anxiety   . Depression   . ADD (attention deficit disorder)   . Insomnia   . Outbursts of anger   . Concussion     X 6- 7  . ADHD (attention deficit hyperactivity disorder) 04/02/2011  . 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 Tobacco abuse 04/28/2011  . Preventative health care 10/03/2011  . ED (erectile dysfunction) 05/12/2012  . 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 Hyperlipidemia, mild 04/18/2015  . 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 Punctured lung    . Toe surgeries  during childhood     for ingrown toenails    Family History  Problem Relation Age of Onset  . Depression Mother   . Anxiety disorder Mother   . Depression Brother   . Diabetes Brother     type 1  . Cancer Paternal Grandmother     lung/ didn't smoke    Social History   Social History  . Marital Status: Single    Spouse Name: N/A  . Number of Children: N/A  . Years of Education: N/A   Occupational History  . Not on file.   Social History Main Topics  . Smoking status: Current Every Day Smoker -- 0.30 packs/day    Types: Cigarettes  . Smokeless tobacco: Never Used  . Alcohol Use: 21.6 oz/week    36 Cans of beer per week     Comment: beer occasionally last drink 6 days ago, uses magic mushrooms 5 times a year- stopped drinking 1.5 months ago  . Drug Use: Yes    Special: Marijuana,  Other-see comments  . Sexual Activity:    Partners: Female     Comment: lives alone and eating well. exercising   Other Topics Concern  . Not on file   Social History Narrative    Outpatient Prescriptions Prior to Visit  Medication Sig Dispense Refill  . ALPRAZolam (XANAX XR) 3 MG 24 hr tablet TAKE 1 TABLET BY MOUTH EVERY MORNING 30 tablet 5  . amphetamine-dextroamphetamine (ADDERALL) 20 MG tablet Take 1 tablet (20 mg total) by mouth 2 (two) times daily. November   2016 rx 60 tablet 0  . cholecalciferol (VITAMIN D) 1000 UNITS tablet Take 1,000 Units by mouth every morning.    . citalopram (CELEXA) 20 MG tablet Take 1 tablet (20 mg total) by mouth daily. 30 tablet 3  . fish oil-omega-3 fatty acids 1000 MG capsule Take 2 g by mouth daily.    . fluticasone (FLONASE) 50 MCG/ACT nasal spray Place 2 sprays into both nostrils daily as needed for allergies or rhinitis. 16 g 6  . Multiple Vitamin (MULTIVITAMIN) tablet Take 1 tablet by mouth every morning.     . nebivolol (BYSTOLIC) 2.5 MG tablet Take 1 tablet (2.5 mg total) by mouth daily as needed. palpitations 30 tablet 3  . vitamin B-12 (CYANOCOBALAMIN) 1000 MCG  tablet Take 1,000 mcg by mouth every morning.    . vitamin C (ASCORBIC ACID) 500 MG tablet Take 500 mg by mouth daily.     No facility-administered medications prior to visit.    Allergies  Allergen Reactions  . Haloperidol Decanoate   . Hydroxyzine Anxiety    EXCESSIVE ANXIOUNESS  . Penicillins Hives  . Valium     Review of Systems  Constitutional: Negative for fever and malaise/fatigue.  HENT: Negative for congestion.   Eyes: Negative for discharge.  Respiratory: Negative for shortness of breath.   Cardiovascular: Negative for chest pain, palpitations and leg swelling.  Gastrointestinal: Negative for nausea and abdominal pain.  Genitourinary: Negative for dysuria.  Musculoskeletal: Negative for falls.  Skin: Negative for rash.  Neurological: Negative for loss of  consciousness and headaches.  Endo/Heme/Allergies: Negative for environmental allergies.  Psychiatric/Behavioral: Negative for depression. The patient is not nervous/anxious.        Objective:    Physical Exam  Constitutional: He is oriented to person, place, and time. He appears well-developed and well-nourished. No distress.  HENT:  Head: Normocephalic and atraumatic.  Nose: Nose normal.  Eyes: Right eye exhibits no discharge. Left eye exhibits no discharge.  Neck: Normal range of motion. Neck supple.  Cardiovascular: Normal rate and regular rhythm.   No murmur heard. Pulmonary/Chest: Effort normal and breath sounds normal.  Abdominal: Soft. Bowel sounds are normal. There is no tenderness.  Musculoskeletal: He exhibits no edema.  Neurological: He is alert and oriented to person, place, and time.  Skin: Skin is warm and dry.  Psychiatric: He has a normal mood and affect.  Nursing note and vitals reviewed.   BP 120/80 mmHg  Pulse 86  Temp(Src) 97.6 F (36.4 C) (Oral)  Ht 6\' 1"  (1.854 m)  Wt 156 lb 4 oz (70.875 kg)  BMI 20.62 kg/m2  SpO2 94% Wt Readings from Last 3 Encounters:  08/05/15 156 lb 4 oz (70.875 kg)  04/15/15 159 lb 4 oz (72.235 kg)  09/13/14 154 lb (69.854 kg)     Lab Results  Component Value Date   WBC 6.9 04/15/2015   HGB 17.1* 04/15/2015   HCT 49.0 04/15/2015   PLT 280 04/15/2015   GLUCOSE 62* 04/15/2015   CHOL 170 04/15/2015   TRIG 158* 04/15/2015   HDL 57 04/15/2015   LDLCALC 81 04/15/2015   ALT 35 04/15/2015   AST 26 04/15/2015   NA 141 04/15/2015   K 4.3 04/15/2015   CL 100 04/15/2015   CREATININE 0.98 04/15/2015   BUN 9 04/15/2015   CO2 28 04/15/2015   TSH 1.985 04/15/2015    Lab Results  Component Value Date   TSH 1.985 04/15/2015   Lab Results  Component Value Date   WBC 6.9 04/15/2015   HGB 17.1* 04/15/2015   HCT 49.0 04/15/2015   MCV 85.2 04/15/2015   PLT 280 04/15/2015   Lab Results  Component Value Date   NA 141  04/15/2015   K 4.3 04/15/2015   CO2 28 04/15/2015   GLUCOSE 62* 04/15/2015   BUN 9 04/15/2015   CREATININE 0.98 04/15/2015   BILITOT 0.8 04/15/2015   ALKPHOS 77 04/15/2015   AST 26 04/15/2015   ALT 35 04/15/2015   PROT 7.6 04/15/2015   ALBUMIN 5.0 04/15/2015   CALCIUM 9.9 04/15/2015   Lab Results  Component Value Date   CHOL 170 04/15/2015   Lab Results  Component Value Date   HDL 57 04/15/2015   Lab  Results  Component Value Date   LDLCALC 81 04/15/2015   Lab Results  Component Value Date   TRIG 158* 04/15/2015   Lab Results  Component Value Date   CHOLHDL 3.0 04/15/2015   No results found for: HGBA1C     Assessment & Plan:   Problem List Items Addressed This Visit    Tobacco abuse    Encouraged complete cessation. Discussed need to quit as relates to risk of numerous cancers, cardiac and pulmonary disease as well as neurologic complications. Counseled for greater than 3 minutes      Tachycardia    Improved on recheck. Not taking Bystolic presently and feels well. No changes for now      Nasal septal deviation    Congestion, eustachian dysfunction, referred to ENT for further evaluation      Hyperlipidemia, mild    Encouraged heart healthy diet, increase exercise, avoid trans fats, consider a krill oil cap daily      Alcohol abuse, in remission    Continues to refrain and is doing well.      ADHD (attention deficit hyperactivity disorder)    Doing well on current meds no changes       Other Visit Diagnoses    Encounter for immunization    -  Primary    Nasal congestion        Relevant Orders    Ambulatory referral to ENT       I am having Mr. Creary maintain his multivitamin, vitamin B-12, cholecalciferol, fish oil-omega-3 fatty acids, vitamin C, nebivolol, fluticasone, citalopram, ALPRAZolam, and amphetamine-dextroamphetamine.  No orders of the defined types were placed in this encounter.     Danise Edge, MD

## 2015-08-13 NOTE — Assessment & Plan Note (Signed)
Improved on recheck. Not taking Bystolic presently and feels well. No changes for now

## 2015-08-13 NOTE — Assessment & Plan Note (Signed)
Encouraged heart healthy diet, increase exercise, avoid trans fats, consider a krill oil cap daily 

## 2015-08-13 NOTE — Assessment & Plan Note (Signed)
Doing well on current meds no changes 

## 2015-08-21 ENCOUNTER — Other Ambulatory Visit: Payer: Self-pay | Admitting: Family Medicine

## 2015-08-24 DIAGNOSIS — J3489 Other specified disorders of nose and nasal sinuses: Secondary | ICD-10-CM | POA: Diagnosis not present

## 2015-08-24 DIAGNOSIS — J342 Deviated nasal septum: Secondary | ICD-10-CM | POA: Diagnosis not present

## 2015-09-27 ENCOUNTER — Other Ambulatory Visit: Payer: Self-pay | Admitting: Family Medicine

## 2015-10-11 ENCOUNTER — Other Ambulatory Visit: Payer: Self-pay | Admitting: Family Medicine

## 2015-10-11 DIAGNOSIS — F988 Other specified behavioral and emotional disorders with onset usually occurring in childhood and adolescence: Secondary | ICD-10-CM

## 2015-10-11 MED ORDER — AMPHETAMINE-DEXTROAMPHETAMINE 20 MG PO TABS: 20.0000 mg | ORAL_TABLET | Freq: Two times a day (BID) | ORAL | Status: DC

## 2015-10-11 MED ORDER — AMPHETAMINE-DEXTROAMPHETAMINE 20 MG PO TABS
20.0000 mg | ORAL_TABLET | Freq: Two times a day (BID) | ORAL | Status: DC
Start: 2015-10-11 — End: 2015-10-11

## 2015-10-11 NOTE — Telephone Encounter (Signed)
Caller name: Molli HazardMatthew   Relationship to patient: Self   Can be reached: [phone]  Reason for call: Pt called in to request a refill on his ADDERALL Rx. Pt would like to be called when Rx is ready for pick up.

## 2015-10-11 NOTE — Telephone Encounter (Signed)
Requesting: Adderall Contract   04/15/2015 UDS  Moderate Last OV  08/05/2015 Last Refill   #60 with 0 refills on 07/22/2015  Please Advise

## 2015-10-11 NOTE — Telephone Encounter (Signed)
Ok to write Adderall, Dec, Jan and Feb 2017

## 2015-10-11 NOTE — Telephone Encounter (Signed)
Printed as instructed by PCP. Called the patient informed to pickup hardcopy's at the front desk.

## 2015-10-20 ENCOUNTER — Other Ambulatory Visit: Payer: Self-pay | Admitting: Family Medicine

## 2015-10-20 NOTE — Telephone Encounter (Signed)
Covering for Dr Abner GreenspanBlyth. I gave him one month 2 refills. Since I have never seen pt. Don't feel confortable with 5 refills.

## 2015-10-20 NOTE — Telephone Encounter (Signed)
Faxed hardcopy for Alprazolam to [facility] 

## 2015-10-20 NOTE — Telephone Encounter (Signed)
Requesting: Alprazolam Contract   04/15/2015 UDS  Moderate Last OV    08/05/2015 Last Refill   #30 with 5 refills on 04/15/2015  Please Advise

## 2015-11-18 MED FILL — ALPRAZolam ER 3 MG TB24: 3 | 30 days supply | Qty: 30 | Fill #1

## 2015-12-16 MED FILL — ALPRAZolam ER 3 MG TB24: 3 | 30 days supply | Qty: 30 | Fill #2

## 2015-12-30 ENCOUNTER — Other Ambulatory Visit: Payer: Self-pay | Admitting: Family Medicine

## 2015-12-30 DIAGNOSIS — F988 Other specified behavioral and emotional disorders with onset usually occurring in childhood and adolescence: Secondary | ICD-10-CM

## 2015-12-30 NOTE — Telephone Encounter (Signed)
Caller name: Self  Can be reached: [phone]   Reason for call: Refill amphetamine-dextroamphetamine (ADDERALL) 20 MG tablet [[id number]][[id number]]

## 2015-12-30 NOTE — Telephone Encounter (Signed)
Last refill 09/2015 and last appointment 08/05/2015

## 2016-01-01 NOTE — Telephone Encounter (Signed)
OK to refill Med with #30 and 1 rf

## 2016-01-02 MED ORDER — AMPHETAMINE-DEXTROAMPHETAMINE 20 MG PO TABS: 20.0000 mg | ORAL_TABLET | Freq: Two times a day (BID) | ORAL | Status: DC

## 2016-01-02 NOTE — Telephone Encounter (Signed)
Printed prescription and called the patient to pickup at the front

## 2016-01-16 ENCOUNTER — Telehealth: Payer: Self-pay

## 2016-01-16 ENCOUNTER — Other Ambulatory Visit: Payer: Self-pay | Admitting: Family Medicine

## 2016-01-16 MED ORDER — ALPRAZOLAM ER 3 MG PO TB24: ORAL_TABLET | ORAL | Status: DC

## 2016-01-16 MED FILL — ALPRAZolam ER 3 MG TB24: 3 | 30 days supply | Qty: 30 | Fill #0

## 2016-01-16 NOTE — Telephone Encounter (Signed)
Relation to WU:JWJXpt:self Call back number:[phone] Pharmacy: [facility] - [place], [place] [address] [phone] (Phone) [phone])         Reason for call:  Patient requesting a refill ALPRAZolam (XANAX XR) 3 MG 24 hr tablet. Patient states he's completely out.

## 2016-01-16 NOTE — Telephone Encounter (Signed)
This appears to be a Dr. Abner GreenspanBlyth pt. I don't see that I have seen pt. Would you direct request to pt pcp.

## 2016-01-16 NOTE — Telephone Encounter (Signed)
Faxed hardcopy for alprazolam to [facility]

## 2016-01-16 NOTE — Telephone Encounter (Signed)
Pt was last seen on 08/15/15. Last refill was 12/16/15. Pleas advise on refill for xanax.

## 2016-01-16 NOTE — Telephone Encounter (Signed)
Requesting:  Alprazolam Contract   04/15/2015 UDS   Moderate Last OV   08/05/2015 Last Refill   #30 with 2 refills on 10/20/2015  Please Advise

## 2016-01-17 NOTE — Telephone Encounter (Signed)
Rx filled by PCP

## 2016-01-30 ENCOUNTER — Other Ambulatory Visit: Payer: Self-pay | Admitting: Family Medicine

## 2016-01-30 DIAGNOSIS — F988 Other specified behavioral and emotional disorders with onset usually occurring in childhood and adolescence: Secondary | ICD-10-CM

## 2016-01-30 MED ORDER — AMPHETAMINE-DEXTROAMPHETAMINE 20 MG PO TABS: 20.0000 mg | ORAL_TABLET | Freq: Two times a day (BID) | ORAL | Status: DC

## 2016-01-30 NOTE — Telephone Encounter (Signed)
Caller name: Self  Can be reached: [phone]  Reason for call: Request refill on amphetamine-dextroamphetamine (ADDERALL) 20 MG tablet [[id number]

## 2016-01-30 NOTE — Telephone Encounter (Signed)
Called informed the patient to pickup hardcopy at the front desk. 

## 2016-01-30 NOTE — Telephone Encounter (Signed)
Requesting:   Adderall Contract    04/15/2015 UDS    Moderate Last OV   08/05/2015 Last Refill   #60 with 0 refills on 01/02/2016  Please Advise

## 2016-02-03 ENCOUNTER — Ambulatory Visit: Payer: Self-pay | Admitting: Family Medicine

## 2016-02-14 MED FILL — ALPRAZolam ER 3 MG TB24: 3 | 30 days supply | Qty: 30 | Fill #1

## 2016-02-23 ENCOUNTER — Encounter: Payer: Self-pay | Admitting: Family Medicine

## 2016-02-23 ENCOUNTER — Ambulatory Visit (INDEPENDENT_AMBULATORY_CARE_PROVIDER_SITE_OTHER): Payer: Medicare Other | Admitting: Family Medicine

## 2016-02-23 VITALS — BP 110/70 | HR 91 | Temp 97.3°F | Ht 74.0 in | Wt 167.0 lb

## 2016-02-23 DIAGNOSIS — F411 Generalized anxiety disorder: Secondary | ICD-10-CM

## 2016-02-23 DIAGNOSIS — Z720 Tobacco use: Secondary | ICD-10-CM | POA: Diagnosis not present

## 2016-02-23 DIAGNOSIS — E785 Hyperlipidemia, unspecified: Secondary | ICD-10-CM | POA: Diagnosis not present

## 2016-02-23 DIAGNOSIS — F909 Attention-deficit hyperactivity disorder, unspecified type: Secondary | ICD-10-CM

## 2016-02-23 DIAGNOSIS — F908 Attention-deficit hyperactivity disorder, other type: Secondary | ICD-10-CM

## 2016-02-23 DIAGNOSIS — F988 Other specified behavioral and emotional disorders with onset usually occurring in childhood and adolescence: Secondary | ICD-10-CM

## 2016-02-23 DIAGNOSIS — R000 Tachycardia, unspecified: Secondary | ICD-10-CM

## 2016-02-23 MED ORDER — AMPHETAMINE-DEXTROAMPHETAMINE 20 MG PO TABS: 20.0000 mg | ORAL_TABLET | Freq: Two times a day (BID) | ORAL | Status: DC

## 2016-02-23 MED ORDER — CLINDAMYCIN PHOS-BENZOYL PEROX 1-5 % EX GEL: Freq: Two times a day (BID) | CUTANEOUS | Status: DC

## 2016-02-23 MED ORDER — NICOTINE 7 MG/24HR TD PT24: 7.0000 mg | MEDICATED_PATCH | Freq: Every day | TRANSDERMAL | Status: DC

## 2016-02-23 NOTE — Progress Notes (Signed)
Subjective:    Patient ID: Jesse KielMatthew A Arave, male    DOB: 02-Nov-1980, 35 y.o.   MRN: [id number]  Chief Complaint  Patient presents with  . Follow-up    HPI  Patient is in today for follow up. Patient has some concern with a cyst on his right ear and some acne on the face.    Patient reports a deviated septum and has to have surgery to repair nasal septum.  Patient reports he wants to quit smoking.  Denies CP/palp/SOB/HA/congestion/fevers/GI or GU c/o. Taking meds as prescribed.   Past Medical History  Diagnosis Date  . Anxiety   . Depression   . ADD (attention deficit disorder)   . Insomnia   . Outbursts of anger   . Concussion     X 6- 7  . ADHD (attention deficit hyperactivity disorder) 04/02/2011  . 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 Tobacco abuse 04/28/2011  . Preventative health care 10/03/2011  . ED (erectile dysfunction) 05/12/2012  . 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 Hyperlipidemia, mild 04/18/2015  . 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 Punctured lung    . Toe surgeries  during childhood     for ingrown toenails    Family History  Problem Relation Age of Onset  . Depression Mother   . Anxiety disorder Mother   . Depression Brother   . Diabetes Brother     type 1  . Cancer Paternal Grandmother     lung/ didn't smoke    Social History   Social History  . Marital Status: Single    Spouse Name: N/A  . Number of Children: N/A  . Years of Education: N/A   Occupational History  . Not on file.   Social History Main Topics  . Smoking status: Current Every Day Smoker -- 0.30 packs/day    Types: Cigarettes  . Smokeless tobacco: Never Used  . Alcohol Use: 21.6 oz/week    36 Cans of beer per week     Comment: beer occasionally last drink 6 days ago, uses magic mushrooms 5 times a year- stopped drinking 1.5 months ago  . Drug Use: Yes    Special: Marijuana,  Other-see comments  . Sexual Activity:    Partners: Female     Comment: lives alone and eating well. exercising   Other Topics Concern  . Not on file   Social History Narrative    Outpatient Prescriptions Prior to Visit  Medication Sig Dispense Refill  . ALPRAZolam (XANAX XR) 3 MG 24 hr tablet TAKE ONE TABLET BY MOUTH EACH MORNING 30 tablet 2  . cholecalciferol (VITAMIN D) 1000 UNITS tablet Take 1,000 Units by mouth every morning.    . citalopram (CELEXA) 20 MG tablet TAKE 1 TABLET BY MOUTH EVERY DAY 30 tablet 3  . fish oil-omega-3 fatty acids 1000 MG capsule Take 2 g by mouth daily.    . fluticasone (FLONASE) 50 MCG/ACT nasal spray Place 2 sprays into both nostrils daily as needed for allergies or rhinitis. 16 g 6  . Multiple Vitamin (MULTIVITAMIN) tablet Take 1 tablet by mouth every morning.     . nebivolol (BYSTOLIC) 2.5 MG tablet Take 1 tablet (2.5 mg total) by mouth daily as needed. palpitations 30 tablet 3  . vitamin B-12 (CYANOCOBALAMIN) 1000 MCG tablet Take 1,000 mcg by mouth every morning.    . vitamin C (ASCORBIC ACID) 500  MG tablet Take 500 mg by mouth daily.    Marland Kitchen amphetamine-dextroamphetamine (ADDERALL) 20 MG tablet Take 1 tablet (20 mg total) by mouth 2 (two) times daily. April  2017 60 tablet 0  . citalopram (CELEXA) 20 MG tablet TAKE 1 TABLET BY MOUTH EVERY DAY 30 tablet 5   No facility-administered medications prior to visit.    Allergies  Allergen Reactions  . Haloperidol Decanoate   . Hydroxyzine Anxiety    EXCESSIVE ANXIOUNESS  . Penicillins Hives  . Valium     Review of Systems  Constitutional: Negative for fever and malaise/fatigue.  HENT: Negative for congestion.   Eyes: Negative for blurred vision.  Respiratory: Negative for shortness of breath.   Cardiovascular: Negative for chest pain, palpitations and leg swelling.  Gastrointestinal: Negative for nausea, abdominal pain and blood in stool.  Genitourinary: Negative for dysuria and frequency.    Musculoskeletal: Negative for falls.  Skin: Negative for rash.  Neurological: Negative for dizziness, loss of consciousness and headaches.  Endo/Heme/Allergies: Negative for environmental allergies.  Psychiatric/Behavioral: Negative for depression. The patient is not nervous/anxious.        Objective:    Physical Exam  Constitutional: He is oriented to person, place, and time. He appears well-developed and well-nourished. No distress.  HENT:  Head: Normocephalic and atraumatic.  Eyes: Conjunctivae are normal.  Neck: Neck supple. No thyromegaly present.  Cardiovascular: Normal rate, regular rhythm and normal heart sounds.   No murmur heard. Pulmonary/Chest: Effort normal and breath sounds normal. No respiratory distress. He has no wheezes.  Abdominal: Soft. Bowel sounds are normal. He exhibits no mass. There is no tenderness.  Musculoskeletal: He exhibits no edema.  Lymphadenopathy:    He has no cervical adenopathy.  Neurological: He is alert and oriented to person, place, and time.  Skin: Skin is warm and dry.  Psychiatric: He has a normal mood and affect. His behavior is normal.    BP 110/70 mmHg  Pulse 91  Temp(Src) 97.3 F (36.3 C) (Oral)  Ht  (1.88 m)  Wt 167 lb (75.751 kg)  BMI 21.43 kg/m2  SpO2 99% Wt Readings from Last 3 Encounters:  02/23/16 167 lb (75.751 kg)  08/05/15 156 lb 4 oz (70.875 kg)  04/15/15 159 lb 4 oz (72.235 kg)     Lab Results  Component Value Date   WBC 6.9 04/15/2015   HGB 17.1* 04/15/2015   HCT 49.0 04/15/2015   PLT 280 04/15/2015   GLUCOSE 62* 04/15/2015   CHOL 170 04/15/2015   TRIG 158* 04/15/2015   HDL 57 04/15/2015   LDLCALC 81 04/15/2015   ALT 35 04/15/2015   AST 26 04/15/2015   NA 141 04/15/2015   K 4.3 04/15/2015   CL 100 04/15/2015   CREATININE 0.98 04/15/2015   BUN 9 04/15/2015   CO2 28 04/15/2015   TSH 1.985 04/15/2015    Lab Results  Component Value Date   TSH 1.985 04/15/2015   Lab Results  Component  Value Date   WBC 6.9 04/15/2015   HGB 17.1* 04/15/2015   HCT 49.0 04/15/2015   MCV 85.2 04/15/2015   PLT 280 04/15/2015   Lab Results  Component Value Date   NA 141 04/15/2015   K 4.3 04/15/2015   CO2 28 04/15/2015   GLUCOSE 62* 04/15/2015   BUN 9 04/15/2015   CREATININE 0.98 04/15/2015   BILITOT 0.8 04/15/2015   ALKPHOS 77 04/15/2015   AST 26 04/15/2015   ALT 35 04/15/2015  PROT 7.6 04/15/2015   ALBUMIN 5.0 04/15/2015   CALCIUM 9.9 04/15/2015   Lab Results  Component Value Date   CHOL 170 04/15/2015   Lab Results  Component Value Date   HDL 57 04/15/2015   Lab Results  Component Value Date   LDLCALC 81 04/15/2015   Lab Results  Component Value Date   TRIG 158* 04/15/2015   Lab Results  Component Value Date   CHOLHDL 3.0 04/15/2015   No results found for: HGBA1C     Assessment & Plan:   Problem List Items Addressed This Visit    Tobacco abuse    Encouraged complete cessation. Discussed need to quit as relates to risk of numerous cancers, cardiac and pulmonary disease as well as neurologic complications. Counseled for greater than 3 minutes. Sent in rx for nicotine patches 7 daily      Relevant Medications   nicotine (NICODERM CQ) 7 mg/24hr patch   Tachycardia    Mild, asymptomatic, no changes      Hyperlipidemia, mild    Encouraged heart healthy diet, increase exercise, avoid trans fats, consider a krill oil cap daily      GAD (generalized anxiety disorder)    Has been doing very well. No new concerns.       ADHD (attention deficit hyperactivity disorder)    Doing well on current meds, no new meds.       Other Visit Diagnoses    ADD (attention deficit disorder)    -  Primary    Relevant Medications    amphetamine-dextroamphetamine (ADDERALL) 20 MG tablet       I have changed Mr. Bewley amphetamine-dextroamphetamine, amphetamine-dextroamphetamine, and amphetamine-dextroamphetamine. I am also having him start on clindamycin-benzoyl  peroxide and nicotine. Additionally, I am having him maintain his multivitamin, vitamin B-12, cholecalciferol, fish oil-omega-3 fatty acids, vitamin C, nebivolol, fluticasone, citalopram, and ALPRAZolam.  Meds ordered this encounter  Medications  . DISCONTD: amphetamine-dextroamphetamine (ADDERALL) 20 MG tablet    Sig: Take 20 mg by mouth daily. May 2017  . DISCONTD: amphetamine-dextroamphetamine (ADDERALL) 20 MG tablet    Sig: Take 20 mg by mouth daily. June 2017  . amphetamine-dextroamphetamine (ADDERALL) 20 MG tablet    Sig: Take 1 tablet (20 mg total) by mouth 2 (two) times daily. April   2017    Dispense:  60 tablet    Refill:  0  . amphetamine-dextroamphetamine (ADDERALL) 20 MG tablet    Sig: Take 1 tablet (20 mg total) by mouth 2 (two) times daily. June 2017    Dispense:  60 tablet    Refill:  0  . amphetamine-dextroamphetamine (ADDERALL) 20 MG tablet    Sig: Take 1 tablet (20 mg total) by mouth 2 (two) times daily. May 2017    Dispense:  60 tablet    Refill:  0  . clindamycin-benzoyl peroxide (BENZACLIN) gel    Sig: Apply topically 2 (two) times daily.    Dispense:  25 g    Refill:  2  . nicotine (NICODERM CQ) 7 mg/24hr patch    Sig: Place 1 patch (7 mg total) onto the skin daily.    Dispense:  28 patch    Refill:  1     BLYTH, STACEY, MD

## 2016-02-23 NOTE — Assessment & Plan Note (Signed)
Encouraged heart healthy diet, increase exercise, avoid trans fats, consider a krill oil cap daily 

## 2016-02-23 NOTE — Assessment & Plan Note (Signed)
Encouraged complete cessation. Discussed need to quit as relates to risk of numerous cancers, cardiac and pulmonary disease as well as neurologic complications. Counseled for greater than 3 minutes. Sent in rx for nicotine patches 7 daily

## 2016-02-23 NOTE — Patient Instructions (Addendum)
Apply hydrogen peroxide to face.  NOW Probiotic Vitamin. Available at [facility]. Smoking Cessation, Tips for Success If you are ready to quit smoking, congratulations! You have chosen to help yourself be healthier. Cigarettes bring nicotine, tar, carbon monoxide, and other irritants into your body. Your lungs, heart, and blood vessels will be able to work better without these poisons. There are many different ways to quit smoking. Nicotine gum, nicotine patches, a nicotine inhaler, or nicotine nasal spray can help with physical craving. Hypnosis, support groups, and medicines help break the habit of smoking. WHAT THINGS CAN I DO TO MAKE QUITTING EASIER?  Here are some tips to help you quit for good:  Pick a date when you will quit smoking completely. Tell all of your friends and family about your plan to quit on that date.  Do not try to slowly cut down on the number of cigarettes you are smoking. Pick a quit date and quit smoking completely starting on that day.  Throw away all cigarettes.   Clean and remove all ashtrays from your home, work, and car.  On a card, write down your reasons for quitting. Carry the card with you and read it when you get the urge to smoke.  Cleanse your body of nicotine. Drink enough water and fluids to keep your urine clear or pale yellow. Do this after quitting to flush the nicotine from your body.  Learn to predict your moods. Do not let a bad situation be your excuse to have a cigarette. Some situations in your life might tempt you into wanting a cigarette.  Never have "just one" cigarette. It leads to wanting another and another. Remind yourself of your decision to quit.  Change habits associated with smoking. If you smoked while driving or when feeling stressed, try other activities to replace smoking. Stand up when drinking your coffee. Brush your teeth after eating. Sit in a different chair when you read the paper. Avoid alcohol while trying to  quit, and try to drink fewer caffeinated beverages. Alcohol and caffeine may urge you to smoke.  Avoid foods and drinks that can trigger a desire to smoke, such as sugary or spicy foods and alcohol.  Ask people who smoke not to smoke around you.  Have something planned to do right after eating or having a cup of coffee. For example, plan to take a walk or exercise.  Try a relaxation exercise to calm you down and decrease your stress. Remember, you may be tense and nervous for the first 2 weeks after you quit, but this will pass.  Find new activities to keep your hands busy. Play with a pen, coin, or rubber band. Doodle or draw things on paper.  Brush your teeth right after eating. This will help cut down on the craving for the taste of tobacco after meals. You can also try mouthwash.   Use oral substitutes in place of cigarettes. Try using lemon drops, carrots, cinnamon sticks, or chewing gum. Keep them handy so they are available when you have the urge to smoke.  When you have the urge to smoke, try deep breathing.  Designate your home as a nonsmoking area.  If you are a heavy smoker, ask your health care provider about a prescription for nicotine chewing gum. It can ease your withdrawal from nicotine.  Reward yourself. Set aside the cigarette money you save and buy yourself something nice.  Look for support from others. Join a support group or smoking cessation program.  Ask someone at home or at work to help you with your plan to quit smoking.  Always ask yourself, "Do I need this cigarette or is this just a reflex?" Tell yourself, "Today, I choose not to smoke," or "I do not want to smoke." You are reminding yourself of your decision to quit.  Do not replace cigarette smoking with electronic cigarettes (commonly called e-cigarettes). The safety of e-cigarettes is unknown, and some may contain harmful chemicals.  If you relapse, do not give up! Plan ahead and think about what you  will do the next time you get the urge to smoke. HOW WILL I FEEL WHEN I QUIT SMOKING? You may have symptoms of withdrawal because your body is used to nicotine (the addictive substance in cigarettes). You may crave cigarettes, be irritable, feel very hungry, cough often, get headaches, or have difficulty concentrating. The withdrawal symptoms are only temporary. They are strongest when you first quit but will go away within 10-14 days. When withdrawal symptoms occur, stay in control. Think about your reasons for quitting. Remind yourself that these are signs that your body is healing and getting used to being without cigarettes. Remember that withdrawal symptoms are easier to treat than the major diseases that smoking can cause.  Even after the withdrawal is over, expect periodic urges to smoke. However, these cravings are generally short lived and will go away whether you smoke or not. Do not smoke! WHAT RESOURCES ARE AVAILABLE TO HELP ME QUIT SMOKING? Your health care provider can direct you to community resources or hospitals for support, which may include:  Group support.  Education.  Hypnosis.  Therapy.   This information is not intended to replace advice given to you by your health care provider. Make sure you discuss any questions you have with your health care provider.   Document Released: 07/13/2004 Document Revised: 11/05/2014 Document Reviewed: 04/02/2013 Elsevier Interactive Patient Education [employer]

## 2016-02-23 NOTE — Progress Notes (Signed)
Pre visit review using our clinic review tool, if applicable. No additional management support is needed unless otherwise documented below in the visit note. 

## 2016-03-11 NOTE — Assessment & Plan Note (Signed)
Doing well on current meds, no new meds.

## 2016-03-11 NOTE — Assessment & Plan Note (Signed)
Mild, asymptomatic, no changes 

## 2016-03-11 NOTE — Assessment & Plan Note (Signed)
Has been doing very well. No new concerns.

## 2016-03-14 MED FILL — ALPRAZolam ER 3 MG TB24: 3 | 30 days supply | Qty: 30 | Fill #2

## 2016-03-17 ENCOUNTER — Other Ambulatory Visit: Payer: Self-pay | Admitting: Family Medicine

## 2016-04-12 ENCOUNTER — Telehealth: Payer: Self-pay

## 2016-04-12 MED ORDER — ALPRAZOLAM ER 3 MG PO TB24: ORAL_TABLET | ORAL | Status: DC

## 2016-04-12 MED FILL — ALPRAZolam ER 3 MG TB24: 3 | 30 days supply | Qty: 30 | Fill #0

## 2016-04-12 NOTE — Telephone Encounter (Signed)
Pharmacy request received via fax.  Alprazolam ER 3 mg tablet Last filled:  01/16/16 Amt: 30,2 Last OV:  02/23/16 04/15/15 controlled substance contract signed, uds sample given, Moderate risk, next screen 07/16/15.  Please advise.

## 2016-04-12 NOTE — Telephone Encounter (Signed)
OK to refill Alprazolam XR #30 with 1 rf

## 2016-04-12 NOTE — Telephone Encounter (Signed)
Noted.  Medication called into the pharmacy.  Pt notified and made aware.

## 2016-05-11 MED FILL — ALPRAZolam ER 3 MG TB24: 3 | 30 days supply | Qty: 30 | Fill #1

## 2016-05-21 ENCOUNTER — Other Ambulatory Visit: Payer: Self-pay | Admitting: Family Medicine

## 2016-05-21 DIAGNOSIS — F988 Other specified behavioral and emotional disorders with onset usually occurring in childhood and adolescence: Secondary | ICD-10-CM

## 2016-05-21 MED ORDER — AMPHETAMINE-DEXTROAMPHETAMINE 20 MG PO TABS: 20.0000 mg | ORAL_TABLET | Freq: Two times a day (BID) | ORAL | 0 refills | Status: DC

## 2016-05-21 NOTE — Telephone Encounter (Signed)
Last refill on June 2017 Last office visit 02/23/2016 Contract 04/15/2015 and moderate UDS Refill request for Adderall

## 2016-05-21 NOTE — Telephone Encounter (Signed)
°  Relationship to patient: Self Can be reached: [phone]    Reason for call: Request refill amphetamine-dextroamphetamine (ADDERALL) 20 MG tablet

## 2016-05-22 NOTE — Telephone Encounter (Signed)
Called the patient informed hardcopy's are ready for pickup at the front desk (left message).

## 2016-06-11 ENCOUNTER — Other Ambulatory Visit: Payer: Self-pay | Admitting: Family Medicine

## 2016-06-11 MED FILL — ALPRAZolam ER 3 MG TB24: 3 | 30 days supply | Qty: 30 | Fill #0

## 2016-06-11 NOTE — Telephone Encounter (Signed)
Faxed hardcopy for Alprazolam to [facility] 

## 2016-07-10 MED FILL — ALPRAZolam ER 3 MG TB24: 3 | 30 days supply | Qty: 30 | Fill #1

## 2016-07-17 ENCOUNTER — Other Ambulatory Visit: Payer: Self-pay | Admitting: Family Medicine

## 2016-07-26 ENCOUNTER — Other Ambulatory Visit: Payer: Self-pay | Admitting: Family Medicine

## 2016-07-26 MED ORDER — CITALOPRAM HYDROBROMIDE 20 MG PO TABS: 20.0000 mg | ORAL_TABLET | Freq: Every day | ORAL | 1 refills | Status: DC

## 2016-07-27 DIAGNOSIS — J343 Hypertrophy of nasal turbinates: Secondary | ICD-10-CM | POA: Diagnosis not present

## 2016-07-27 DIAGNOSIS — J342 Deviated nasal septum: Secondary | ICD-10-CM | POA: Diagnosis not present

## 2016-08-06 ENCOUNTER — Other Ambulatory Visit: Payer: Self-pay | Admitting: Family Medicine

## 2016-08-06 MED FILL — ALPRAZolam ER 3 MG TB24: 3 | 30 days supply | Qty: 30 | Fill #0

## 2016-08-06 NOTE — Telephone Encounter (Signed)
Faxed hardcopy alprazolam to [facility]

## 2016-08-10 DIAGNOSIS — J343 Hypertrophy of nasal turbinates: Secondary | ICD-10-CM | POA: Diagnosis not present

## 2016-08-10 DIAGNOSIS — J342 Deviated nasal septum: Secondary | ICD-10-CM | POA: Diagnosis not present

## 2016-08-13 ENCOUNTER — Telehealth: Payer: Self-pay | Admitting: Family Medicine

## 2016-08-13 DIAGNOSIS — F988 Other specified behavioral and emotional disorders with onset usually occurring in childhood and adolescence: Secondary | ICD-10-CM

## 2016-08-13 MED ORDER — AMPHETAMINE-DEXTROAMPHETAMINE 20 MG PO TABS: 20.0000 mg | ORAL_TABLET | Freq: Two times a day (BID) | ORAL | 0 refills | Status: DC

## 2016-08-13 NOTE — Telephone Encounter (Signed)
Called him and let him know that we have we his rx ready. Gave 2 months of medication

## 2016-08-13 NOTE — Telephone Encounter (Signed)
Caller name: Molli HazardMatthew Relation to pt: self Call back number: [phone] Pharmacy:  Reason for call: Pt requesting printed rx for amphetamine-dextroamphetamine (ADDERALL) 20 MG tablet. (last 05-21-16). Please advise.

## 2016-08-13 NOTE — Telephone Encounter (Signed)
Requesting:  Adderall Contract  04/15/15 UDS   moderate Last OV   02/23/2016 Last Refill  September 2017  Please Advise

## 2016-08-13 NOTE — Telephone Encounter (Signed)
Pt called in to follow up on medication request. Advised that he will receive a call when ready for pick up.   CB: [phone]    Pt also wanted to make PCP aware that he quit smoking. He is so excited about it. He says that Dr. Leonard SchwartzB was advising him to stop and he was finally able to. He says that he just want her to know.

## 2016-08-15 DIAGNOSIS — H52203 Unspecified astigmatism, bilateral: Secondary | ICD-10-CM | POA: Diagnosis not present

## 2016-08-15 DIAGNOSIS — H5310 Unspecified subjective visual disturbances: Secondary | ICD-10-CM | POA: Diagnosis not present

## 2016-08-15 DIAGNOSIS — H5213 Myopia, bilateral: Secondary | ICD-10-CM | POA: Diagnosis not present

## 2016-08-15 NOTE — Telephone Encounter (Signed)
Please let patient know he made my day with his quitting smoking.

## 2016-08-24 ENCOUNTER — Ambulatory Visit: Payer: Self-pay | Admitting: Family Medicine

## 2016-09-05 MED FILL — ALPRAZolam ER 3 MG TB24: 3 | 30 days supply | Qty: 30 | Fill #1

## 2016-09-09 DIAGNOSIS — Z23 Encounter for immunization: Secondary | ICD-10-CM | POA: Diagnosis not present

## 2016-09-14 ENCOUNTER — Telehealth: Payer: Self-pay | Admitting: Family Medicine

## 2016-09-14 NOTE — Telephone Encounter (Signed)
Left message to AWV with nurse

## 2016-10-02 ENCOUNTER — Encounter: Payer: Self-pay | Admitting: Family Medicine

## 2016-10-02 ENCOUNTER — Ambulatory Visit (INDEPENDENT_AMBULATORY_CARE_PROVIDER_SITE_OTHER): Payer: Medicare Other | Admitting: Family Medicine

## 2016-10-02 VITALS — BP 114/80 | HR 91 | Temp 97.9°F | Wt 173.4 lb

## 2016-10-02 DIAGNOSIS — E785 Hyperlipidemia, unspecified: Secondary | ICD-10-CM

## 2016-10-02 DIAGNOSIS — E162 Hypoglycemia, unspecified: Secondary | ICD-10-CM

## 2016-10-02 DIAGNOSIS — R000 Tachycardia, unspecified: Secondary | ICD-10-CM

## 2016-10-02 DIAGNOSIS — F988 Other specified behavioral and emotional disorders with onset usually occurring in childhood and adolescence: Secondary | ICD-10-CM

## 2016-10-02 DIAGNOSIS — Z720 Tobacco use: Secondary | ICD-10-CM

## 2016-10-02 DIAGNOSIS — Z0000 Encounter for general adult medical examination without abnormal findings: Secondary | ICD-10-CM

## 2016-10-02 DIAGNOSIS — F1011 Alcohol abuse, in remission: Secondary | ICD-10-CM

## 2016-10-02 DIAGNOSIS — F411 Generalized anxiety disorder: Secondary | ICD-10-CM

## 2016-10-02 DIAGNOSIS — J342 Deviated nasal septum: Secondary | ICD-10-CM

## 2016-10-02 DIAGNOSIS — F908 Attention-deficit hyperactivity disorder, other type: Secondary | ICD-10-CM

## 2016-10-02 DIAGNOSIS — F3160 Bipolar disorder, current episode mixed, unspecified: Secondary | ICD-10-CM

## 2016-10-02 MED ORDER — ALPRAZOLAM ER 3 MG PO TB24: ORAL_TABLET | ORAL | 5 refills | Status: DC

## 2016-10-02 MED ORDER — AMPHETAMINE-DEXTROAMPHETAMINE 20 MG PO TABS: 20.0000 mg | ORAL_TABLET | Freq: Two times a day (BID) | ORAL | 0 refills | Status: DC

## 2016-10-02 MED FILL — ALPRAZolam ER 3 MG TB24: 3 | 30 days supply | Qty: 30 | Fill #0

## 2016-10-02 NOTE — Assessment & Plan Note (Signed)
Continues to abstain 

## 2016-10-02 NOTE — Progress Notes (Signed)
Pre visit review using our clinic review tool, if applicable. No additional management support is needed unless otherwise documented below in the visit note. 

## 2016-10-02 NOTE — Assessment & Plan Note (Signed)
adderall working less well but not a candidate for increase due to tachycardia and anxiety.

## 2016-10-02 NOTE — Progress Notes (Signed)
Subjective:   Jesse Jones is a 35 y.o. male who presents for Medicare Annual/Subsequent preventive examination.  Review of Systems:  No ROS.  Medicare Wellness Visit.  Cardiac Risk Factors include: smoking/ tobacco exposure;male gender Sleep patterns:  No sleep schedule. Doesn't work. Sleeps whenever per pt. Doesn't want to take anything to help him sleep.  Home Safety/Smoke Alarms:  Smoke detectors in place at apartment.   Living environment; residence and Firearm Safety: Lives alone at apartment. No firearms. States he feels safe.   Seat Belt Safety/Bike Helmet: Wears seatbelt.    Counseling:   Eye Exam- Wears glasses/contacts. Follows with [facility]. Dental- Follows with [facility] every 6 months.  Male:   CCS- n/a    PSA-  n/a     Objective:    Vitals: BP 114/80 (BP Location: Left Arm, Patient Position: Sitting, Cuff Size: Normal)   Pulse 91   Temp 97.9 F (36.6 C) (Oral)   Wt 173 lb 6.4 oz (78.7 kg)   SpO2 99% Comment: RA  BMI 22.26 kg/m   Body mass index is 22.26 kg/m.  Tobacco History  Smoking Status  . Current Every Day Smoker  . Packs/day: 0.30  . Years: 20.00  . Types: Cigarettes  Smokeless Tobacco  . Never Used     Ready to quit: Yes Counseling given: Yes   Past Medical History:  Diagnosis Date  . ADD (attention deficit disorder)   . ADHD (attention deficit hyperactivity disorder) 04/02/2011  . 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 Anxiety   . 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 Concussion    X 6- 7  . 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 Depression   . ED (erectile dysfunction) 05/12/2012  . Hyperlipidemia, mild 04/18/2015  . Insomnia   . 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 Outbursts of anger   . Preventative health care 10/03/2011  . Tobacco abuse 04/28/2011   Past Surgical History:  Procedure Laterality Date  . NASAL SEPTUM SURGERY Bilateral    Dr Jenne PaneBates 2017  . punctured lung    . toe surgeries  during  childhood    for ingrown toenails   Family History  Problem Relation Age of Onset  . Depression Mother   . Anxiety disorder Mother   . Depression Brother   . Diabetes Brother     type 1  . Cancer Paternal Grandmother     lung/ didn't smoke   History  Sexual Activity  . Sexual activity: Yes  . Partners: Female    Comment: lives alone and eating well. exercising    Outpatient Encounter Prescriptions as of 10/02/2016  Medication Sig  . ALPRAZolam (XANAX XR) 3 MG 24 hr tablet TAKE ONE TABLET BY MOUTH EACH MORNING  . amphetamine-dextroamphetamine (ADDERALL) 20 MG tablet Take 1 tablet (20 mg total) by mouth 2 (two) times daily. February 2018  . amphetamine-dextroamphetamine (ADDERALL) 20 MG tablet Take 1 tablet (20 mg total) by mouth 2 (two) times daily. January 2018  . amphetamine-dextroamphetamine (ADDERALL) 20 MG tablet Take 1 tablet (20 mg total) by mouth 2 (two) times daily. December  2017  . cholecalciferol (VITAMIN D) 1000 UNITS tablet Take 1,000 Units by mouth every morning.  . citalopram (CELEXA) 20 MG tablet Take 1 tablet (20 mg total) by mouth daily.  . clindamycin-benzoyl peroxide (BENZACLIN) gel Apply topically 2 (two) times daily.  . fish oil-omega-3 fatty acids 1000 MG capsule Take 2 g by mouth daily.  . fluticasone (FLONASE)  50 MCG/ACT nasal spray Place 2 sprays into both nostrils daily as needed for allergies or rhinitis.  . Multiple Vitamin (MULTIVITAMIN) tablet Take 1 tablet by mouth every morning.   . nebivolol (BYSTOLIC) 2.5 MG tablet Take 1 tablet (2.5 mg total) by mouth daily as needed. palpitations  . nicotine (NICODERM CQ) 7 mg/24hr patch Place 1 patch (7 mg total) onto the skin daily.  . vitamin B-12 (CYANOCOBALAMIN) 1000 MCG tablet Take 1,000 mcg by mouth every morning.  . vitamin C (ASCORBIC ACID) 500 MG tablet Take 500 mg by mouth daily.  . [DISCONTINUED] ALPRAZolam (XANAX XR) 3 MG 24 hr tablet TAKE ONE TABLET BY MOUTH EACH MORNING  . [DISCONTINUED]  amphetamine-dextroamphetamine (ADDERALL) 20 MG tablet Take 1 tablet (20 mg total) by mouth 2 (two) times daily. September  2017  . [DISCONTINUED] amphetamine-dextroamphetamine (ADDERALL) 20 MG tablet Take 1 tablet (20 mg total) by mouth 2 (two) times daily.  . [DISCONTINUED] amphetamine-dextroamphetamine (ADDERALL) 20 MG tablet Take 1 tablet (20 mg total) by mouth 2 (two) times daily. November  2017   No facility-administered encounter medications on file as of 10/02/2016.     Activities of Daily Living In your present state of health, do you have any difficulty performing the following activities: 10/02/2016  Hearing? N  Vision? N  Difficulty concentrating or making decisions? N  Walking or climbing stairs? N  Dressing or bathing? N  Doing errands, shopping? Y  Preparing Food and eating ? Y  Using the Toilet? N  In the past six months, have you accidently leaked urine? N  Do you have problems with loss of bowel control? N  Managing your Medications? N  Managing your Finances? Y  Housekeeping or managing your Housekeeping? N  Some recent data might be hidden    Patient Care Team: Bradd Canary, MD as PCP - General (Family Medicine) Christia Reading, MD as Consulting Physician (Otolaryngology)   Assessment:    Physical assessment deferred to PCP.  Exercise Activities and Dietary recommendations Current Exercise Habits: Home exercise routine, Type of exercise: strength training/weights, Time (Minutes): 30, Frequency (Times/Week): 7, Weekly Exercise (Minutes/Week): 210, Intensity: Moderate  Diet: Pt states he only does easy meals.  24 HOUR RECALL: Breakfast: Cereal   Lunch: Pizza Dinner: Hot Pockets  Goals    . Quit smoking / using tobacco    . Start back to martial arts    . Start riding mountain bike again.                 Fall Risk Fall Risk  10/02/2016  Falls in the past year? No   Depression Screen PHQ 2/9 Scores 10/02/2016  PHQ - 2 Score 0    Cognitive  Function MMSE - Mini Mental State Exam 10/02/2016  Orientation to time 5  Orientation to Place 5  Registration 3  Attention/ Calculation 5  Recall 2  Language- name 2 objects 2  Language- repeat 1  Language- follow 3 step command 3  Language- read & follow direction 1  Write a sentence 1  Copy design 1  Total score 29        Immunization History  Administered Date(s) Administered  . Influenza Split 07/13/2011, 09/29/2012  . Influenza,inj,Quad PF,36+ Mos 09/13/2014, 08/05/2015  . Influenza-Unspecified 08/24/2016  . Tdap 10/03/2011   Screening Tests Health Maintenance  Topic Date Due  . HIV Screening  08/20/1997  . TETANUS/TDAP  10/02/2021  . INFLUENZA VACCINE  Completed      Plan:  During the course of the visit the patient was educated and counseled about the following appropriate screening and preventive services:   Vaccines to include Pneumoccal, Influenza, Hepatitis B, Td, Zostavax, HCV  Cardiovascular Disease  Colorectal cancer screening  Diabetes screening  Prostate Cancer Screening  Glaucoma screening  Nutrition counseling   Smoking cessation counseling  Patient Instructions (the written plan) was given to the patient.    Avon GullyBritt, Keylin Podolsky Angel, [place]  10/03/2016

## 2016-10-02 NOTE — Patient Instructions (Signed)

## 2016-10-02 NOTE — Assessment & Plan Note (Signed)
Flared when stopped smoking so went back on cigarettes and his anxiety improved. Tolerating Citalopram

## 2016-10-02 NOTE — Assessment & Plan Note (Signed)
Recent surgical repair performed by Dr Jenne PaneBates. Has healed well

## 2016-10-02 NOTE — Assessment & Plan Note (Signed)
Encouraged heart healthy diet, increase exercise, avoid trans fats, consider a krill oil cap daily 

## 2016-10-03 NOTE — Progress Notes (Addendum)
RN AWV note reviewed. Agree with documention and plan. Cashlynn Yearwood, MD 

## 2016-10-14 NOTE — Assessment & Plan Note (Signed)
Mild, minimize caffeine and continue current meds report any concerning symptoms

## 2016-10-14 NOTE — Assessment & Plan Note (Signed)
Encouraged complete cessation. Discussed need to quit as relates to risk of numerous cancers, cardiac and pulmonary disease as well as neurologic complications. Counseled for greater than 3 minutes 

## 2016-10-14 NOTE — Progress Notes (Signed)
Patient ID: Jesse Jones, male   DOB: 1980/12/24, 35 y.o.   MRN: [id number]   Subjective:    Patient ID: Jesse Jones, male    DOB: [birth year], 35 y.o.   MRN: [id number]  Chief Complaint  Patient presents with  . Follow-up    77-month. Medication FU. States that the Adderall doesn't appear to be working as well. States that it seems to only work for 30-45 minutes.  . Medicare Wellness    HPI Patient is in today for follow up. He is doing well. He notes the Adderal is not as effective as it once was but he feels it is adequate. No new hospitalizations or acute concerns. He notes some mild congestion but no fevers or escalating symptoms. Denies CP/palp/SOB/HA/congestion/fevers/GI or GU c/o. Taking meds as prescribed  Past Medical History:  Diagnosis Date  . ADD (attention deficit disorder)   . ADHD (attention deficit hyperactivity disorder) 04/02/2011  . 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 Anxiety   . 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 Concussion    X 6- 7  . 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 Depression   . ED (erectile dysfunction) 05/12/2012  . Hyperlipidemia, mild 04/18/2015  . Insomnia   . 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 Outbursts of anger   . Preventative health care 10/03/2011  . Tobacco abuse 04/28/2011    Past Surgical History:  Procedure Laterality Date  . NASAL SEPTUM SURGERY Bilateral    Dr Jenne Pane 2017  . punctured lung    . toe surgeries  during childhood    for ingrown toenails    Family History  Problem Relation Age of Onset  . Depression Mother   . Anxiety disorder Mother   . Depression Brother   . Diabetes Brother     type 1  . Cancer Paternal Grandmother     lung/ didn't smoke    Social History   Social History  . Marital status: Single    Spouse name: N/A  . Number of children: N/A  . Years of education: N/A   Occupational History  . Not on file.   Social History Main Topics  . Smoking status: Current Every Day Smoker      Packs/day: 0.30    Years: 20.00    Types: Cigarettes  . Smokeless tobacco: Never Used  . Alcohol use 21.6 oz/week    36 Cans of beer per week     Comment: sober x21yr. No alcohol.  . Drug use:     Types: Marijuana, Other-see comments     Comment: pt stopped 4 year ago  . Sexual activity: Yes    Partners: Female     Comment: lives alone and eating well. exercising   Other Topics Concern  . Not on file   Social History Narrative  . No narrative on file    Outpatient Medications Prior to Visit  Medication Sig Dispense Refill  . cholecalciferol (VITAMIN D) 1000 UNITS tablet Take 1,000 Units by mouth every morning.    . citalopram (CELEXA) 20 MG tablet Take 1 tablet (20 mg total) by mouth daily. 90 tablet 1  . clindamycin-benzoyl peroxide (BENZACLIN) gel Apply topically 2 (two) times daily. 25 g 2  . fish oil-omega-3 fatty acids 1000 MG capsule Take 2 g by mouth daily.    . fluticasone (FLONASE) 50 MCG/ACT nasal spray Place 2 sprays into both nostrils daily as needed for allergies or rhinitis. 16 g 6  . Multiple  Vitamin (MULTIVITAMIN) tablet Take 1 tablet by mouth every morning.     . nebivolol (BYSTOLIC) 2.5 MG tablet Take 1 tablet (2.5 mg total) by mouth daily as needed. palpitations 30 tablet 3  . nicotine (NICODERM CQ) 7 mg/24hr patch Place 1 patch (7 mg total) onto the skin daily. 28 patch 1  . vitamin B-12 (CYANOCOBALAMIN) 1000 MCG tablet Take 1,000 mcg by mouth every morning.    . vitamin C (ASCORBIC ACID) 500 MG tablet Take 500 mg by mouth daily.    Marland Kitchen. ALPRAZolam (XANAX XR) 3 MG 24 hr tablet TAKE ONE TABLET BY MOUTH EACH MORNING 30 tablet 1  . amphetamine-dextroamphetamine (ADDERALL) 20 MG tablet Take 1 tablet (20 mg total) by mouth 2 (two) times daily. September  2017 60 tablet 0  . amphetamine-dextroamphetamine (ADDERALL) 20 MG tablet Take 1 tablet (20 mg total) by mouth 2 (two) times daily. 60 tablet 0  . amphetamine-dextroamphetamine (ADDERALL) 20 MG tablet Take 1 tablet  (20 mg total) by mouth 2 (two) times daily. November  2017 60 tablet 0   No facility-administered medications prior to visit.     Allergies  Allergen Reactions  . Haloperidol Decanoate   . Hydroxyzine Anxiety    EXCESSIVE ANXIOUNESS  . Other Other (See Comments)    Reverse effect, super hyper  . Penicillins Hives  . Valium     Review of Systems  Constitutional: Negative for fever and malaise/fatigue.  Eyes: Negative for blurred vision.  Respiratory: Negative for shortness of breath.   Cardiovascular: Negative for chest pain, palpitations and leg swelling.  Gastrointestinal: Negative for abdominal pain, blood in stool and nausea.  Genitourinary: Negative for dysuria and frequency.  Musculoskeletal: Negative for falls.  Skin: Negative for rash.  Neurological: Negative for dizziness, loss of consciousness and headaches.  Endo/Heme/Allergies: Negative for environmental allergies.  Psychiatric/Behavioral: Negative for depression. The patient is nervous/anxious.        Objective:    Physical Exam  Constitutional: He is oriented to person, place, and time. He appears well-developed and well-nourished. No distress.  HENT:  Head: Normocephalic and atraumatic.  Nose: Nose normal.  Eyes: Right eye exhibits no discharge. Left eye exhibits no discharge.  Neck: Normal range of motion. Neck supple.  Cardiovascular: Normal rate and regular rhythm.   No murmur heard. Pulmonary/Chest: Effort normal and breath sounds normal.  Abdominal: Soft. Bowel sounds are normal. There is no tenderness.  Musculoskeletal: He exhibits no edema.  Neurological: He is alert and oriented to person, place, and time.  Skin: Skin is warm and dry.  Psychiatric: He has a normal mood and affect.  Nursing note and vitals reviewed.   BP 114/80 (BP Location: Left Arm, Patient Position: Sitting, Cuff Size: Normal)   Pulse 91   Temp 97.9 F (36.6 C) (Oral)   Wt 173 lb 6.4 oz (78.7 kg)   SpO2 99% Comment: RA   BMI 22.26 kg/m  Wt Readings from Last 3 Encounters:  10/02/16 173 lb 6.4 oz (78.7 kg)  02/23/16 167 lb (75.8 kg)  08/05/15 156 lb 4 oz (70.9 kg)     Lab Results  Component Value Date   WBC 6.9 04/15/2015   HGB 17.1 (H) 04/15/2015   HCT 49.0 04/15/2015   PLT 280 04/15/2015   GLUCOSE 62 (L) 04/15/2015   CHOL 170 04/15/2015   TRIG 158 (H) 04/15/2015   HDL 57 04/15/2015   LDLCALC 81 04/15/2015   ALT 35 04/15/2015   AST 26 04/15/2015  NA 141 04/15/2015   K 4.3 04/15/2015   CL 100 04/15/2015   CREATININE 0.98 04/15/2015   BUN 9 04/15/2015   CO2 28 04/15/2015   TSH 1.985 04/15/2015    Lab Results  Component Value Date   TSH 1.985 04/15/2015   Lab Results  Component Value Date   WBC 6.9 04/15/2015   HGB 17.1 (H) 04/15/2015   HCT 49.0 04/15/2015   MCV 85.2 04/15/2015   PLT 280 04/15/2015   Lab Results  Component Value Date   NA 141 04/15/2015   K 4.3 04/15/2015   CO2 28 04/15/2015   GLUCOSE 62 (L) 04/15/2015   BUN 9 04/15/2015   CREATININE 0.98 04/15/2015   BILITOT 0.8 04/15/2015   ALKPHOS 77 04/15/2015   AST 26 04/15/2015   ALT 35 04/15/2015   PROT 7.6 04/15/2015   ALBUMIN 5.0 04/15/2015   CALCIUM 9.9 04/15/2015   Lab Results  Component Value Date   CHOL 170 04/15/2015   Lab Results  Component Value Date   HDL 57 04/15/2015   Lab Results  Component Value Date   LDLCALC 81 04/15/2015   Lab Results  Component Value Date   TRIG 158 (H) 04/15/2015   Lab Results  Component Value Date   CHOLHDL 3.0 04/15/2015   No results found for: HGBA1C     Assessment & Plan:   Problem List Items Addressed This Visit    ADHD (attention deficit hyperactivity disorder)    adderall working less well but not a candidate for increase due to tachycardia and anxiety.      GAD (generalized anxiety disorder)    Flared when stopped smoking so went back on cigarettes and his anxiety improved. Tolerating Citalopram       Tobacco abuse    Encouraged complete  cessation. Discussed need to quit as relates to risk of numerous cancers, cardiac and pulmonary disease as well as neurologic complications. Counseled for greater than 3 minutes      Tachycardia    Mild, minimize caffeine and continue current meds report any concerning symptoms      Relevant Orders   CBC   TSH   Bipolar disorder, mixed (HCC)    Doing well on current meds      Alcohol abuse, in remission    Continues to abstain.       Hyperlipidemia, mild    Encouraged heart healthy diet, increase exercise, avoid trans fats, consider a krill oil cap daily      Relevant Orders   Lipid panel   Nasal septal deviation    Recent surgical repair performed by Dr Jenne Pane. Has healed well      Relevant Orders   CBC    Other Visit Diagnoses    Encounter for Medicare annual wellness exam    -  Primary   Hypoglycemia       Relevant Orders   Comprehensive metabolic panel   Attention deficit disorder, unspecified hyperactivity presence       Relevant Medications   amphetamine-dextroamphetamine (ADDERALL) 20 MG tablet   amphetamine-dextroamphetamine (ADDERALL) 20 MG tablet      I have changed Mr. Massman amphetamine-dextroamphetamine, amphetamine-dextroamphetamine, and amphetamine-dextroamphetamine. I am also having him maintain his multivitamin, vitamin B-12, cholecalciferol, fish oil-omega-3 fatty acids, vitamin C, nebivolol, fluticasone, clindamycin-benzoyl peroxide, nicotine, citalopram, and ALPRAZolam.  Meds ordered this encounter  Medications  . amphetamine-dextroamphetamine (ADDERALL) 20 MG tablet    Sig: Take 1 tablet (20 mg total) by mouth 2 (two) times daily.  February 2018    Dispense:  60 tablet    Refill:  0  . amphetamine-dextroamphetamine (ADDERALL) 20 MG tablet    Sig: Take 1 tablet (20 mg total) by mouth 2 (two) times daily. January 2018    Dispense:  60 tablet    Refill:  0  . amphetamine-dextroamphetamine (ADDERALL) 20 MG tablet    Sig: Take 1 tablet (20 mg  total) by mouth 2 (two) times daily. December  2017    Dispense:  60 tablet    Refill:  0  . ALPRAZolam (XANAX XR) 3 MG 24 hr tablet    Sig: TAKE ONE TABLET BY MOUTH EACH MORNING    Dispense:  30 tablet    Refill:  5     Danise EdgeBLYTH, STACEY, MD

## 2016-10-14 NOTE — Assessment & Plan Note (Signed)
Doing well on current meds.

## 2016-10-30 MED FILL — ALPRAZolam ER 3 MG TB24: 3 | 30 days supply | Qty: 30 | Fill #1

## 2016-11-29 MED FILL — ALPRAZolam ER 3 MG TB24: 3 | 30 days supply | Qty: 30 | Fill #2

## 2016-12-25 ENCOUNTER — Telehealth: Payer: Self-pay | Admitting: Family Medicine

## 2016-12-25 DIAGNOSIS — F988 Other specified behavioral and emotional disorders with onset usually occurring in childhood and adolescence: Secondary | ICD-10-CM

## 2016-12-25 NOTE — Telephone Encounter (Signed)
Requesting:   Adderall Contract    04/15/2015 UDS  Moderate---due Last OV   10/02/2016--no upcoming scheduled appt. Last Refill   Dec/jan/feb   #60  Please Advise

## 2016-12-25 NOTE — Telephone Encounter (Signed)
°  Relation to WU:JWJXpt:self Call back number:[phone]   Reason for call:  Patient requesting a refill amphetamine-dextroamphetamine (ADDERALL) 20 MG tablet

## 2016-12-26 NOTE — Telephone Encounter (Signed)
Needs updated UDS and contract. Then can have 3 month supply of requested meds and set him up for appt in 3 months.

## 2016-12-27 ENCOUNTER — Encounter: Payer: Self-pay | Admitting: Family Medicine

## 2016-12-27 MED ORDER — AMPHETAMINE-DEXTROAMPHETAMINE 20 MG PO TABS: 20.0000 mg | ORAL_TABLET | Freq: Two times a day (BID) | ORAL | 0 refills | Status: DC

## 2016-12-27 MED FILL — ALPRAZolam ER 3 MG TB24: 3 | 30 days supply | Qty: 30 | Fill #3

## 2016-12-27 NOTE — Addendum Note (Signed)
Addended by: Scharlene GlossEWING, Saaya Procell B on: 12/27/2016 07:08 AM   Modules accepted: Orders

## 2016-12-27 NOTE — Telephone Encounter (Signed)
Printed hardcopys/contract/called the patient left detailed message refills ready/schedule appt, contract and uds.

## 2016-12-28 DIAGNOSIS — Z79899 Other long term (current) drug therapy: Secondary | ICD-10-CM | POA: Diagnosis not present

## 2017-01-24 MED FILL — ALPRAZolam ER 3 MG TB24: 3 | 30 days supply | Qty: 30 | Fill #4

## 2017-02-14 ENCOUNTER — Other Ambulatory Visit: Payer: Self-pay | Admitting: Family Medicine

## 2017-02-21 MED FILL — ALPRAZolam ER 3 MG TB24: 3 | 30 days supply | Qty: 30 | Fill #5

## 2017-03-21 ENCOUNTER — Telehealth: Payer: Self-pay | Admitting: Family Medicine

## 2017-03-21 NOTE — Telephone Encounter (Signed)
Requesting:   alprazolam Contract   12/27/2016 UDS   Moderate last done on 07/16/2015 Last OV   10/02/2016 Last Refill    #30 with 5 10/02/2016  Please Advise----[phone]

## 2017-03-21 NOTE — Telephone Encounter (Signed)
He is due for an appt. He can have one 30 day supply but needs UDS and then a follow up appt for more

## 2017-03-22 ENCOUNTER — Encounter: Payer: Self-pay | Admitting: Family Medicine

## 2017-03-22 DIAGNOSIS — Z79899 Other long term (current) drug therapy: Secondary | ICD-10-CM | POA: Diagnosis not present

## 2017-03-22 MED ORDER — ALPRAZOLAM ER 3 MG PO TB24: ORAL_TABLET | ORAL | 0 refills | Status: DC

## 2017-03-22 MED FILL — ALPRAZolam ER 3 MG TB24: 3 | 30 days supply | Qty: 30 | Fill #0

## 2017-03-22 NOTE — Telephone Encounter (Signed)
Printed/pcp signed Called the patient left detailed message to pickup hardcopy/update UDS/make appt. Hardcopy is at the front desk for pickup

## 2017-03-26 ENCOUNTER — Ambulatory Visit (INDEPENDENT_AMBULATORY_CARE_PROVIDER_SITE_OTHER): Payer: Medicare Other | Admitting: Family Medicine

## 2017-03-26 ENCOUNTER — Encounter: Payer: Self-pay | Admitting: Family Medicine

## 2017-03-26 VITALS — BP 107/84 | HR 70 | Temp 98.5°F | Wt 171.8 lb

## 2017-03-26 DIAGNOSIS — E785 Hyperlipidemia, unspecified: Secondary | ICD-10-CM

## 2017-03-26 DIAGNOSIS — Z720 Tobacco use: Secondary | ICD-10-CM

## 2017-03-26 DIAGNOSIS — N529 Male erectile dysfunction, unspecified: Secondary | ICD-10-CM

## 2017-03-26 DIAGNOSIS — F908 Attention-deficit hyperactivity disorder, other type: Secondary | ICD-10-CM | POA: Diagnosis not present

## 2017-03-26 DIAGNOSIS — R000 Tachycardia, unspecified: Secondary | ICD-10-CM | POA: Diagnosis not present

## 2017-03-26 DIAGNOSIS — F3160 Bipolar disorder, current episode mixed, unspecified: Secondary | ICD-10-CM | POA: Diagnosis not present

## 2017-03-26 DIAGNOSIS — F988 Other specified behavioral and emotional disorders with onset usually occurring in childhood and adolescence: Secondary | ICD-10-CM | POA: Diagnosis not present

## 2017-03-26 DIAGNOSIS — F1011 Alcohol abuse, in remission: Secondary | ICD-10-CM | POA: Diagnosis not present

## 2017-03-26 LAB — CBC
HEMATOCRIT: 47.3 % (ref 39.0–52.0)
HEMOGLOBIN: 16.1 g/dL (ref 13.0–17.0)
MCHC: 34 g/dL (ref 30.0–36.0)
MCV: 87 fl (ref 78.0–100.0)
PLATELETS: 259 10*3/uL (ref 150.0–400.0)
RBC: 5.43 Mil/uL (ref 4.22–5.81)
RDW: 13.7 % (ref 11.5–15.5)
WBC: 8.3 10*3/uL (ref 4.0–10.5)

## 2017-03-26 LAB — COMPREHENSIVE METABOLIC PANEL
ALT: 17 U/L (ref 0–53)
AST: 19 U/L (ref 0–37)
Albumin: 4.6 g/dL (ref 3.5–5.2)
Alkaline Phosphatase: 52 U/L (ref 39–117)
BUN: 10 mg/dL (ref 6–23)
CHLORIDE: 101 meq/L (ref 96–112)
CO2: 33 meq/L — AB (ref 19–32)
Calcium: 10.2 mg/dL (ref 8.4–10.5)
Creatinine, Ser: 0.9 mg/dL (ref 0.40–1.50)
GFR: 102.3 mL/min (ref >60.00)
GLUCOSE: 55 mg/dL — AB (ref 70–99)
POTASSIUM: 4.6 meq/L (ref 3.5–5.1)
SODIUM: 139 meq/L (ref 135–145)
Total Bilirubin: 0.4 mg/dL (ref 0.2–1.2)
Total Protein: 7.1 g/dL (ref 6.0–8.3)

## 2017-03-26 LAB — TSH: TSH: 3.64 u[IU]/mL (ref 0.35–4.50)

## 2017-03-26 LAB — LIPID PANEL
CHOL/HDL RATIO: 3
CHOLESTEROL: 154 mg/dL (ref 0–200)
HDL: 61.4 mg/dL (ref >39.00)
LDL CALC: 71 mg/dL (ref 0–99)
NONHDL: 92.7
Triglycerides: 107 mg/dL (ref 0.0–149.0)
VLDL: 21.4 mg/dL (ref 0.0–40.0)

## 2017-03-26 MED ORDER — AMPHETAMINE-DEXTROAMPHETAMINE 20 MG PO TABS: 20.0000 mg | ORAL_TABLET | Freq: Two times a day (BID) | ORAL | 0 refills | Status: DC

## 2017-03-26 NOTE — Assessment & Plan Note (Signed)
Still abstaining 

## 2017-03-26 NOTE — Progress Notes (Signed)
Pre visit review using our clinic review tool, if applicable. No additional management support is needed unless otherwise documented below in the visit note. 

## 2017-03-26 NOTE — Assessment & Plan Note (Signed)
Encouraged heart healthy diet, increase exercise, avoid trans fats, consider a krill oil cap daily. Check level

## 2017-03-26 NOTE — Assessment & Plan Note (Signed)
Encouraged complete cessation. Discussed need to quit as relates to risk of numerous cancers, cardiac and pulmonary disease as well as neurologic complications. Counseled for greater than 3 minutes 

## 2017-03-26 NOTE — Assessment & Plan Note (Signed)
Is doing well on Adderall given refills UDS and contract UTD

## 2017-03-26 NOTE — Assessment & Plan Note (Signed)
Doing well on current meds.

## 2017-03-26 NOTE — Progress Notes (Signed)
Patient ID: Jesse Jones, male   DOB: 1981/10/25, 36 y.o.   MRN: [id number]   Subjective:  I acted as a Neurosurgeon for Jesse Edge, MD. [place], [place]   Patient ID: Jesse Jones, male    DOB: 12/20/1980, 36 y.o.   MRN: [id number]  Chief Complaint  Patient presents with  . Medication Refill    HPI  Patient is in today for a follow up on medications. Patient has a Hx of ED, tachycardia, GAD,ADHD,  mixed bipolar disorder, mild hyperlipidemia. Patient has no acute concerns noted at this time. He has taken up golf and he finds that helps him focus and stay calm. He continues to smoke roughly 1/2 ppd. Continues to abstain from alcohol. Denies CP/palp/SOB/HA/congestion/fevers/GI or GU c/o. Taking meds as prescribed  Patient Care Team: Bradd Canary, MD as PCP - General (Family Medicine) Christia Reading, MD as Consulting Physician (Otolaryngology)   Past Medical History:  Diagnosis Date  . ADD (attention deficit disorder)   . ADHD (attention deficit hyperactivity disorder) 04/02/2011  . 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 Anxiety   . 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 Concussion    X 6- 7  . 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 Depression   . ED (erectile dysfunction) 05/12/2012  . Hyperlipidemia, mild 04/18/2015  . Insomnia   . 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 Outbursts of anger   . Preventative health care 10/03/2011  . Tobacco abuse 04/28/2011    Past Surgical History:  Procedure Laterality Date  . NASAL SEPTUM SURGERY Bilateral    Dr Jenne Pane 2017  . punctured lung    . toe surgeries  during childhood    for ingrown toenails    Family History  Problem Relation Age of Onset  . Depression Mother   . Anxiety disorder Mother   . Depression Brother   . Diabetes Brother        type 1  . Cancer Paternal Grandmother        lung/ didn't smoke    Social History   Social History  . Marital status: Single    Spouse name: N/A  . Number of children: N/A  . Years  of education: N/A   Occupational History  . Not on file.   Social History Main Topics  . Smoking status: Current Every Day Smoker    Packs/day: 0.30    Years: 20.00    Types: Cigarettes  . Smokeless tobacco: Never Used  . Alcohol use 21.6 oz/week    36 Cans of beer per week     Comment: sober x72yr. No alcohol.  . Drug use: Yes    Types: Marijuana, Other-see comments     Comment: pt stopped 4 year ago  . Sexual activity: Yes    Partners: Female     Comment: lives alone and eating well. exercising   Other Topics Concern  . Not on file   Social History Narrative  . No narrative on file    Outpatient Medications Prior to Visit  Medication Sig Dispense Refill  . ALPRAZolam (XANAX XR) 3 MG 24 hr tablet TAKE ONE TABLET BY MOUTH EACH MORNING 30 tablet 0  . cholecalciferol (VITAMIN D) 1000 UNITS tablet Take 1,000 Units by mouth every morning.    . citalopram (CELEXA) 20 MG tablet TAKE 1 TABLET BY MOUTH EVERY DAY 90 tablet 1  . clindamycin-benzoyl peroxide (BENZACLIN) gel Apply topically 2 (two) times daily. 25 g 2  . fish  oil-omega-3 fatty acids 1000 MG capsule Take 2 g by mouth daily.    . fluticasone (FLONASE) 50 MCG/ACT nasal spray Place 2 sprays into both nostrils daily as needed for allergies or rhinitis. 16 g 6  . Multiple Vitamin (MULTIVITAMIN) tablet Take 1 tablet by mouth every morning.     . nicotine (NICODERM CQ) 7 mg/24hr patch Place 1 patch (7 mg total) onto the skin daily. 28 patch 1  . vitamin B-12 (CYANOCOBALAMIN) 1000 MCG tablet Take 1,000 mcg by mouth every morning.    . vitamin C (ASCORBIC ACID) 500 MG tablet Take 500 mg by mouth daily.    Marland Kitchen amphetamine-dextroamphetamine (ADDERALL) 20 MG tablet Take 1 tablet (20 mg total) by mouth 2 (two) times daily. March 2018 60 tablet 0  . amphetamine-dextroamphetamine (ADDERALL) 20 MG tablet Take 1 tablet (20 mg total) by mouth 2 (two) times daily. April  2018 60 tablet 0  . amphetamine-dextroamphetamine (ADDERALL) 20 MG  tablet Take 1 tablet (20 mg total) by mouth 2 (two) times daily. May  2017 60 tablet 0  . nebivolol (BYSTOLIC) 2.5 MG tablet Take 1 tablet (2.5 mg total) by mouth daily as needed. palpitations 30 tablet 3   No facility-administered medications prior to visit.     Allergies  Allergen Reactions  . Haloperidol Decanoate   . Hydroxyzine Anxiety    EXCESSIVE ANXIOUNESS  . Other Other (See Comments)    Reverse effect, super hyper  . Penicillins Hives  . Valium     Review of Systems  Constitutional: Negative for fever and malaise/fatigue.  HENT: Negative for congestion.   Eyes: Negative for blurred vision.  Respiratory: Negative for cough and shortness of breath.   Cardiovascular: Negative for chest pain, palpitations and leg swelling.  Gastrointestinal: Negative for vomiting.  Musculoskeletal: Negative for back pain.  Skin: Negative for rash.  Neurological: Negative for loss of consciousness and headaches.       Objective:    Physical Exam  Constitutional: He is oriented to person, place, and time. He appears well-developed and well-nourished. No distress.  HENT:  Head: Normocephalic and atraumatic.  Eyes: Conjunctivae are normal.  Neck: Normal range of motion. No thyromegaly present.  Cardiovascular: Normal rate and regular rhythm.   Pulmonary/Chest: Effort normal and breath sounds normal. He has no wheezes.  Abdominal: Soft. Bowel sounds are normal. There is no tenderness.  Musculoskeletal: He exhibits no edema or deformity.  Neurological: He is alert and oriented to person, place, and time.  Skin: Skin is warm and dry. He is not diaphoretic.  Psychiatric: He has a normal mood and affect.    BP 107/84 (BP Location: Left Arm, Patient Position: Sitting, Cuff Size: Normal)   Pulse 70   Temp 98.5 F (36.9 C) (Oral)   Wt 171 lb 12.8 oz (77.9 kg)   SpO2 100% Comment: RA  BMI 22.06 kg/m  Wt Readings from Last 3 Encounters:  03/26/17 171 lb 12.8 oz (77.9 kg)  10/02/16 173  lb 6.4 oz (78.7 kg)  02/23/16 167 lb (75.8 kg)   BP Readings from Last 3 Encounters:  03/26/17 107/84  10/02/16 114/80  02/23/16 110/70     Immunization History  Administered Date(s) Administered  . Influenza Split 07/13/2011, 09/29/2012  . Influenza,inj,Quad PF,36+ Mos 09/13/2014, 08/05/2015  . Influenza-Unspecified 08/24/2016  . Tdap 10/03/2011    Health Maintenance  Topic Date Due  . HIV Screening  08/20/1997  . INFLUENZA VACCINE  05/29/2017  . TETANUS/TDAP  10/02/2021  Lab Results  Component Value Date   WBC 6.9 04/15/2015   HGB 17.1 (H) 04/15/2015   HCT 49.0 04/15/2015   PLT 280 04/15/2015   GLUCOSE 62 (L) 04/15/2015   CHOL 170 04/15/2015   TRIG 158 (H) 04/15/2015   HDL 57 04/15/2015   LDLCALC 81 04/15/2015   ALT 35 04/15/2015   AST 26 04/15/2015   NA 141 04/15/2015   K 4.3 04/15/2015   CL 100 04/15/2015   CREATININE 0.98 04/15/2015   BUN 9 04/15/2015   CO2 28 04/15/2015   TSH 1.985 04/15/2015    Lab Results  Component Value Date   TSH 1.985 04/15/2015   Lab Results  Component Value Date   WBC 6.9 04/15/2015   HGB 17.1 (H) 04/15/2015   HCT 49.0 04/15/2015   MCV 85.2 04/15/2015   PLT 280 04/15/2015   Lab Results  Component Value Date   NA 141 04/15/2015   K 4.3 04/15/2015   CO2 28 04/15/2015   GLUCOSE 62 (L) 04/15/2015   BUN 9 04/15/2015   CREATININE 0.98 04/15/2015   BILITOT 0.8 04/15/2015   ALKPHOS 77 04/15/2015   AST 26 04/15/2015   ALT 35 04/15/2015   PROT 7.6 04/15/2015   ALBUMIN 5.0 04/15/2015   CALCIUM 9.9 04/15/2015   Lab Results  Component Value Date   CHOL 170 04/15/2015   Lab Results  Component Value Date   HDL 57 04/15/2015   Lab Results  Component Value Date   LDLCALC 81 04/15/2015   Lab Results  Component Value Date   TRIG 158 (H) 04/15/2015   Lab Results  Component Value Date   CHOLHDL 3.0 04/15/2015   No results found for: HGBA1C       Assessment & Plan:   Problem List Items Addressed This Visit     ADHD (attention deficit hyperactivity disorder)    Is doing well on Adderall given refills UDS and contract UTD      Tobacco abuse    Encouraged complete cessation. Discussed need to quit as relates to risk of numerous cancers, cardiac and pulmonary disease as well as neurologic complications. Counseled for greater than 3 minutes      ED (erectile dysfunction) - Primary   Tachycardia    RRR today      Relevant Orders   CBC   TSH   Comprehensive metabolic panel   Bipolar disorder, mixed (HCC)    Doing well on current meds.       Alcohol abuse, in remission    Still abstaining      Hyperlipidemia, mild    Encouraged heart healthy diet, increase exercise, avoid trans fats, consider a krill oil cap daily. Check level       Other Visit Diagnoses    Attention deficit disorder, unspecified hyperactivity presence       Relevant Medications   amphetamine-dextroamphetamine (ADDERALL) 20 MG tablet   amphetamine-dextroamphetamine (ADDERALL) 20 MG tablet   amphetamine-dextroamphetamine (ADDERALL) 20 MG tablet   Mild hyperlipidemia       Relevant Orders   Lipid panel      I have discontinued Mr. Schumm nebivolol. I have also changed his amphetamine-dextroamphetamine, amphetamine-dextroamphetamine, and amphetamine-dextroamphetamine. Additionally, I am having him maintain his multivitamin, vitamin B-12, cholecalciferol, fish oil-omega-3 fatty acids, vitamin C, fluticasone, clindamycin-benzoyl peroxide, nicotine, citalopram, and ALPRAZolam.  Meds ordered this encounter  Medications  . amphetamine-dextroamphetamine (ADDERALL) 20 MG tablet    Sig: Take 1 tablet (20 mg total) by mouth 2 (two)  times daily. Do not refill until June 2018    Dispense:  60 tablet    Refill:  0  . amphetamine-dextroamphetamine (ADDERALL) 20 MG tablet    Sig: Take 1 tablet (20 mg total) by mouth 2 (two) times daily. Do not refill until July  2018    Dispense:  60 tablet    Refill:  0  .  amphetamine-dextroamphetamine (ADDERALL) 20 MG tablet    Sig: Take 1 tablet (20 mg total) by mouth 2 (two) times daily. Do not refill until August  2018    Dispense:  60 tablet    Refill:  0    CMA served as Neurosurgeonscribe during this visit. History, Physical and Plan performed by medical provider. Documentation and orders reviewed and attested to.  Jesse EdgeStacey Autum Benfer, MD

## 2017-03-26 NOTE — Assessment & Plan Note (Signed)
RRR today 

## 2017-03-26 NOTE — Patient Instructions (Addendum)
Cholesterol Cholesterol is a fat. Your body needs a small amount of cholesterol. Cholesterol (plaque) may build up in your blood vessels (arteries). That makes you more likely to have a heart attack or stroke. You cannot feel your cholesterol level. Having a blood test is the only way to find out if your level is high. Keep your test results. Work with your doctor to keep your cholesterol at a good level. What do the results mean?  Total cholesterol is how much cholesterol is in your blood.  LDL is bad cholesterol. This is the type that can build up. Try to have low LDL.  HDL is good cholesterol. It cleans your blood vessels and carries LDL away. Try to have high HDL.  Triglycerides are fat that the body can store or burn for energy. What are good levels of cholesterol?  Total cholesterol below 200.  LDL below 100 is good for people who have health risks. LDL below 70 is good for people who have very high risks.  HDL above 40 is good. It is best to have HDL of 60 or higher.  Triglycerides below 150. How can I lower my cholesterol? Diet  Follow your diet program as told by your doctor.  Choose fish, white meat chicken, or [place] that is roasted or baked. Try not to eat red meat, fried foods, sausage, or lunch meats.  Eat lots of fresh fruits and vegetables.  Choose whole grains, beans, pasta, potatoes, and cereals.  Choose olive oil, corn oil, or canola oil. Only use small amounts.  Try not to eat butter, mayonnaise, shortening, or palm kernel oils.  Try not to eat foods with trans fats.  Choose low-fat or nonfat dairy foods.  Drink skim or nonfat milk.  Eat low-fat or nonfat yogurt and cheeses.  Try not to drink whole milk or cream.  Try not to eat ice cream, egg yolks, or full-fat cheeses. Healthy desserts include angel food cake, ginger snaps, animal crackers Tobacco Use Disorder Tobacco use disorder (TUD) is a mental disorder. It is the long-term use of tobacco in  spite of related health problems or difficulty with normal life activities. Tobacco is most commonly smoked as cigarettes and less commonly as cigars or pipes. Smokeless chewing tobacco and snuff are also popular. People with TUD get a feeling of extreme pleasure (euphoria) from using tobacco and have a desire to use it again and again. Repeated use of tobacco can cause problems. The addictive effects of tobacco are due mainly tothe ingredient nicotine. Nicotine also causes a rush of adrenaline (epinephrine) in the body. This leads to increased blood pressure, heart rate, and breathing rate. These changes may cause problems for people with high blood pressure, weak hearts, or lung disease. High doses of nicotine in children and pets can lead to seizures and death. Tobacco contains a number of other unsafe chemicals. These chemicals are especially harmful when inhaled as smoke and can damage almost every organ in the body. Smokers live shorter lives than nonsmokers and are at risk of dying from a number of diseases and cancers. Tobacco smoke can also cause health problems for nonsmokers (due to inhaling secondhand smoke). Smoking is also a fire hazard. TUD usually starts in the late teenage years and is most common in young adults between the ages of 25 and 25 years. People who start smoking earlier in life are more likely to continue smoking as adults. TUD is somewhat more common in men than women. People with TUD  are at higher risk for using alcohol and other drugs of abuse. What increases the risk? Risk factors for TUD include:  Having family members with the disorder.  Being around people who use tobacco.  Having an existing mental health issue such as schizophrenia, depression, bipolar disorder, ADHD, or posttraumatic stress disorder (PTSD). What are the signs or symptoms? People with tobacco use disorder have two or more of the following signs and symptoms within 12 months:  Use of more tobacco  over a longer period than intended.  Not able to cut down or control tobacco use.  A lot of time spent obtaining or using tobacco.  Strong desire or urge to use tobacco (craving). Cravings may last for 6 months or longer after quitting.  Use of tobacco even when use leads to major problems at work, school, or home.  Use of tobacco even when use leads to relationship problems.  Giving up or cutting down on important life activities because of tobacco use.  Repeatedly using tobacco in situations where it puts you or others in physical danger, like smoking in bed.  Use of tobacco even when it is known that a physical or mental problem is likely related to tobacco use.  Physical problems are numerous and may include chronic bronchitis, emphysema, lung and other cancers, gum disease, high blood pressure, heart disease, and stroke.  Mental problems caused by tobacco may include difficulty sleeping and anxiety.  Need to use greater amounts of tobacco to get the same effect. This means you have developed a tolerance.  Withdrawal symptoms as a result of stopping or rapidly cutting back use. These symptoms may last a month or more after quitting and include the following:  Depressed, anxious, or irritable mood.  Difficulty concentrating.  Increased appetite.  Restlessness or trouble sleeping.  Use of tobacco to avoid withdrawal symptoms. How is this diagnosed? Tobacco use disorder is diagnosed by your health care provider. A diagnosis may be made by:  Your health care provider asking questions about your tobacco use and any problems it may be causing.  A physical exam.  Lab tests.  You may be referred to a mental health professional or addiction specialist. The severity of tobacco use disorder depends on the number of signs and symptoms you have:  Mild-Two or three symptoms.  Moderate-Four or five symptoms.  Severe-Six or more symptoms. How is this treated? Many people with  tobacco use disorder are unable to quit on their own and need help. Treatment options include the following:  Nicotine replacement therapy (NRT). NRT provides nicotine without the other harmful chemicals in tobacco. NRT gradually lowers the dosage of nicotine in the body and reduces withdrawal symptoms. NRT is available in over-the-counter forms (gum, lozenges, and skin patches) as well as prescription forms (mouth inhaler and nasal spray).  Medicines.This may include:  Antidepressant medicine that may reduce nicotine cravings.  A medicine that acts on nicotine receptors in the brain to reduce cravings and withdrawal symptoms. It may also block the effects of tobacco in people with TUD who relapse.  Counseling or talk therapy. A form of talk therapy called behavioral therapy is commonly used to treat people with TUD. Behavioral therapy looks at triggers for tobacco use, how to avoid them, and how to cope with cravings. It is most effective in person or by phone but is also available in self-help forms (books and Internet websites).  Support groups. These provide emotional support, advice, and guidance for quitting tobacco. The most  effective treatment for TUD is usually a combination of medicine, talk therapy, and support groups. Follow these instructions at home:  Keep all follow-up visits as directed by your health care provider. This is important.  Take medicines only as directed by your health care provider.  Check with your health care provider before starting new prescription or over-the-counter medicines. Contact a health care provider if:  You are not able to take your medicines as prescribed.  Treatment is not helping your TUD and your symptoms get worse. Get help right away if:  You have serious thoughts about hurting yourself or others.  You have trouble breathing, chest pain, sudden weakness, or sudden numbness in part of your body. This information is not intended to  replace advice given to you by your health care provider. Make sure you discuss any questions you have with your health care provider. Document Released: 06/20/2004 Document Revised: 06/17/2016 Document Reviewed: 12/11/2013 Elsevier Interactive Patient Education  2017 [employer]  , hard candy, popsicles, and low-fat or nonfat frozen yogurt. Try not to eat pastries, cakes, pies, and cookies. Exercise  Follow your exercise program as told by your doctor.  Be more active. Try gardening, walking, and taking the stairs.  Ask your doctor about ways that you can be more active. Medicine  Take over-the-counter and prescription medicines only as told by your doctor. This information is not intended to replace advice given to you by your health care provider. Make sure you discuss any questions you have with your health care provider. Document Released: 01/11/2009 Document Revised: 05/16/2016 Document Reviewed: 04/26/2016 Elsevier Interactive Patient Education  2017 [employer]

## 2017-04-19 MED FILL — ALPRAZolam ER 3 MG TB24: 3 | 30 days supply | Qty: 30 | Fill #1

## 2017-05-17 ENCOUNTER — Telehealth: Payer: Self-pay | Admitting: Family Medicine

## 2017-05-17 MED ORDER — ALPRAZOLAM ER 3 MG PO TB24: ORAL_TABLET | ORAL | 0 refills | Status: DC

## 2017-05-17 MED FILL — ALPRAZolam ER 3 MG TB24: 3 | 30 days supply | Qty: 30 | Fill #0

## 2017-05-17 NOTE — Telephone Encounter (Signed)
Medication has been approved by covering provider for Dr. Abner GreenspanBlyth.   His dad picked the medication up.   I also advised patient that on his controlled substance contract he did not indicate that he would use 2 pharmacies. [facility] told me that [facility] and uses our [facility] for his X[facility]ax. I explained to him that maybe he needed to use the [facility] for all of his RX's in the future and also he has to give us more time to fill his rx.     PC

## 2017-05-17 NOTE — Telephone Encounter (Signed)
[facility] called in to speak with CMA about pt's Rx.   CMA states that she will call back.

## 2017-05-17 NOTE — Telephone Encounter (Signed)
Patient states he will check with pharmacy because he thinks he has refills

## 2017-05-17 NOTE — Telephone Encounter (Signed)
Covering for Dr. Abner GreenspanBlyth  Requesting: Xanax Contract: Yes UDS: Yes moderate risk, next screen 06/22/17 Last OV:03/26/17 Last Refill:03/22/17 #[id number] Next OV: 09/27/17   Please Advise

## 2017-05-17 NOTE — Telephone Encounter (Signed)
Relation to ZO:XWRUpt:self Call back number:[phone]   Reason for call:  Patient requesting a refill ALPRAZolam (XANAX XR) 3 MG 24 hr tablet

## 2017-05-17 NOTE — Telephone Encounter (Signed)
Noted and agree. 

## 2017-05-27 NOTE — Telephone Encounter (Signed)
Patient stated he wanted to know if he could get a rx to last him until August.  I advised him that he should have his RX for August and he needs to fill it in 2 days. He should only be taking it 2 times daily, and he can refill his meds in 2 days with the rx that he already has.    PC

## 2017-05-27 NOTE — Telephone Encounter (Addendum)
Patient would like to discuss message below, please advise best # [phone]

## 2017-06-18 ENCOUNTER — Telehealth: Payer: Self-pay | Admitting: Family

## 2017-06-18 NOTE — Telephone Encounter (Signed)
Caller name: Galo Baehr Relationship to patient: self Can be reached: [phone] Pharmacy: [facility]  Reason for call: Pt requesting refill on alprazolam. Pt has 1 dose left for today. Pt requesting call to notify him when RX sent in.

## 2017-06-18 NOTE — Telephone Encounter (Signed)
Requesting: Xanax Contract: Yes UDS: Moderate Risk Nxt scrn 8.25.18 Last OV: 5.29.18 Next OV: 11.30.18 Last Refill:  7.20.18   #[id number]   Please advise

## 2017-06-19 MED FILL — ALPRAZolam ER 3 MG TB24: 3 | 30 days supply | Qty: 30 | Fill #0

## 2017-06-19 NOTE — Telephone Encounter (Signed)
Could not locate Rx that was printed yesterday. Gave verbal to [place] at [facility]. Notified pt.

## 2017-06-19 NOTE — Telephone Encounter (Addendum)
Relation to RJ:JOAC Call back number: [phone] Pharmacy: [facility] - [place], [place] - [address] [phone] (Phone) [phone] (Fax)   D.O.D:  Dr. Patsy Lager  Reason for call:   Patient checking on the status of medication refill request,informed patient Dr. Abner Greenspan is out of the office and in the future all medication request require 48 hour in advance, patient states he's complety out, please advise

## 2017-06-26 ENCOUNTER — Telehealth: Payer: Self-pay | Admitting: Family Medicine

## 2017-06-26 DIAGNOSIS — F988 Other specified behavioral and emotional disorders with onset usually occurring in childhood and adolescence: Secondary | ICD-10-CM

## 2017-06-26 NOTE — Telephone Encounter (Signed)
OK to give a 3 month supply of Adderall

## 2017-06-26 NOTE — Telephone Encounter (Signed)
°  Relation to ZO:XWRUpt:self Call back number:[phone] [facility]acy: [id number] [place] - [address] [phone] (Phone) [phone])      Reason for call:  Patient requesting a refill amphetamine-dextroamphetamine (ADDERALL) 20 MG tablet

## 2017-06-26 NOTE — Telephone Encounter (Signed)
Requesting:  Adderall Contract: Yes UDS: 5.25.18 mod risk next screen 8.25.18 Last OV: 5.29.18 Next OV: 11.30.18 Last Refill: 3 given at 5.29.18 visit   Please advise

## 2017-06-27 MED ORDER — AMPHETAMINE-DEXTROAMPHETAMINE 20 MG PO TABS: 20.0000 mg | ORAL_TABLET | Freq: Two times a day (BID) | ORAL | 0 refills | Status: DC

## 2017-06-27 NOTE — Telephone Encounter (Signed)
Pt called in to follow up on refill request.   Advised of the below

## 2017-06-27 NOTE — Telephone Encounter (Signed)
Printed Adderall for Sept, Oct, Nov per SB/will call pt when signed/thx dmf

## 2017-06-27 NOTE — Telephone Encounter (Signed)
Done

## 2017-07-19 ENCOUNTER — Telehealth: Payer: Self-pay | Admitting: Family Medicine

## 2017-07-19 MED ORDER — ALPRAZOLAM ER 3 MG PO TB24: 3.0000 mg | ORAL_TABLET | Freq: Every morning | ORAL | 0 refills | Status: DC

## 2017-07-19 MED FILL — ALPRAZolam ER 3 MG TB24: 3 | 30 days supply | Qty: 30 | Fill #0

## 2017-07-19 NOTE — Telephone Encounter (Signed)
I don't understand the see on the Xanax prescription. It states 1 mg doses 3 mg total. So is he taking it 3 times a day?  Would you mind calling his pharmacy or finding the last prescription in the chart. I have never seen him and I'm little hesitant to write 1 mg 3 times a day. Many years ago looks like she was on a lower dose

## 2017-07-19 NOTE — Telephone Encounter (Signed)
Pt has been on 3 mg Xanax XR for awhile. Pharmacy states there is an  Xanax only in XR.

## 2017-07-19 NOTE — Telephone Encounter (Signed)
Reviewed patient's [facility] database prescription. It does appear his been getting the Xanax 3 mg dose for quite a long time. I did refill him 30 tablets. No refills.  Would you please coordinate with receptionist upfront and have patient follow-up with his PCP in about 3-4 weeks/before he runs out of his current prescription.

## 2017-07-19 NOTE — Telephone Encounter (Signed)
Left pt a message stating rx faxed to pharmacy and to schedule follow up within a month.

## 2017-07-19 NOTE — Telephone Encounter (Signed)
Refill Request: Alprazolam    Last RX:06/18/17 Last OV:03/26/17 Next OV:09/27/17 UDS:03/25/17 CSC:12/27/16

## 2017-07-19 NOTE — Telephone Encounter (Signed)
Relation to XL:KGMW  Call back [phone] Pharmacy:   [facility]  Reason for call:  Patient requesting ALPRAZolam (XANAX XR) 3 MG 24 hr tablet, please advise (patient states pharmacy sent the request Wednesday and he states if he doesn't get Rx filled today he will spend the weekend in the hospital)

## 2017-07-19 NOTE — Telephone Encounter (Signed)
Pt would like to know if provider is able to supply refills until his apt?

## 2017-07-23 NOTE — Telephone Encounter (Signed)
LVM for pt to return call to schedule an appt with provider within 2-3 wks.

## 2017-07-23 NOTE — Telephone Encounter (Signed)
Patient has a scheduled physical with PCP for 09/27/2017, please advise if Rx can be filled to hold patient over until appointment.

## 2017-08-11 ENCOUNTER — Other Ambulatory Visit: Payer: Self-pay | Admitting: Family Medicine

## 2017-08-16 ENCOUNTER — Other Ambulatory Visit: Payer: Self-pay

## 2017-08-16 ENCOUNTER — Other Ambulatory Visit: Payer: Self-pay | Admitting: Medical

## 2017-08-16 ENCOUNTER — Telehealth: Payer: Self-pay | Admitting: Family Medicine

## 2017-08-16 MED ORDER — ALPRAZOLAM ER 3 MG PO TB24: 3.0000 mg | ORAL_TABLET | Freq: Every morning | ORAL | 0 refills | Status: DC

## 2017-08-16 NOTE — Telephone Encounter (Signed)
Pt's mom came to pick up the rx, states it was not at pharmacy, pt states that he has enough to get him through Sunday, however he will need this rx at 8:00 am on Monday morning otherwise pt will end up in the hospital. Pt [facility] wants to ensure that medication is at the pharmacy first thing Monday morning.

## 2017-08-16 NOTE — Telephone Encounter (Signed)
rx faxed to pharmacy

## 2017-08-16 NOTE — Telephone Encounter (Signed)
rx request sent to pcp

## 2017-08-16 NOTE — Telephone Encounter (Signed)
Signed at office

## 2017-08-16 NOTE — Telephone Encounter (Signed)
Requesting:xanax Contract:yes UDS:yes Last OV:5/291/8 Next OV:09/27/17 Last Refill:07/19/17  [phone]  Please advise

## 2017-08-16 NOTE — Telephone Encounter (Signed)
Relation to AV:WUJWpt:self Call back number: [phone] Pharmacy:  Reason for call:  Patient requesting  ALPRAZolam (XANAX XR) 3 MG 24 hr tablet, patient called pharmacy yesterday and would like to know the status, he will run out on Sunday, please advise

## 2017-08-26 ENCOUNTER — Telehealth: Payer: Self-pay | Admitting: Family Medicine

## 2017-08-26 DIAGNOSIS — F988 Other specified behavioral and emotional disorders with onset usually occurring in childhood and adolescence: Secondary | ICD-10-CM

## 2017-08-26 NOTE — Telephone Encounter (Signed)
Pt wants to be proactive pt wants to use [facility] for Alprazolam scripts..does he need to come fill out a contract saying he wants that [facility]. Also, [facility] [address] He knows he cant fill until 08/29/17. He is going to go have that filled there tomorrow.

## 2017-08-27 MED ORDER — AMPHETAMINE-DEXTROAMPHETAMINE 20 MG PO TABS: 20.0000 mg | ORAL_TABLET | Freq: Two times a day (BID) | ORAL | 0 refills | Status: DC

## 2017-08-27 NOTE — Telephone Encounter (Signed)
Patient checking on the status of message below, please advise   amphetamine-dextroamphetamine (ADDERALL) 20 MG tablet

## 2017-08-27 NOTE — Telephone Encounter (Signed)
signed

## 2017-08-27 NOTE — Telephone Encounter (Signed)
Requesting:Adderall Contract:yes UDS:yes, moderate next screen 06/22/17 Last OV:03/20/17 Next OV:09/27/17 Last Refill:06/27/17 For September, October November 2018   Please advise

## 2017-08-28 NOTE — Telephone Encounter (Signed)
Spoke to the patient he stated [facility] needed [facility] [address] to [facility] refill of adderall (they have hardcopy already). PCP did approve this refill last night at 10:13 PM on 08/27/2017 [facility] to [facility]derall. He will call back later to discuss getting alprazolam at the [facility]---can sign contract again stating if needed, but at this time does not need alprazolam

## 2017-09-10 ENCOUNTER — Other Ambulatory Visit: Payer: Self-pay | Admitting: Family Medicine

## 2017-09-13 ENCOUNTER — Other Ambulatory Visit: Payer: Self-pay | Admitting: Family Medicine

## 2017-09-13 NOTE — Telephone Encounter (Signed)
RequestingPrudy Jones: XANAX 3 MG Contract: YES UDS: 03/22/17 moderate risk Last OV: 03/26/17 Next OV: 09/27/17 Last Refill: 08/16/17   Please advise

## 2017-09-15 MED ORDER — ALPRAZOLAM ER 3 MG PO TB24: 3.0000 mg | ORAL_TABLET | Freq: Every morning | ORAL | 0 refills | Status: DC

## 2017-09-24 ENCOUNTER — Other Ambulatory Visit: Payer: Self-pay | Admitting: Family Medicine

## 2017-09-24 DIAGNOSIS — F988 Other specified behavioral and emotional disorders with onset usually occurring in childhood and adolescence: Secondary | ICD-10-CM

## 2017-09-25 NOTE — Telephone Encounter (Signed)
Pt called in to follow up on refill request. Provided him with the status that Rx was received and that someone will call him once filled for pick up.    Please call pt once ready for pick up. Pt says that he would like to pick up today if possible. He will be in the area. PCP is out of the office.     Please assist further.

## 2017-09-25 NOTE — Telephone Encounter (Signed)
He can have a November rx rewritten to be filled this week and he can keep his dec, [place] and feb rx's

## 2017-09-25 NOTE — Telephone Encounter (Signed)
It appears that Rxs were printed on 08/27/17 for December, January, February. Pt states that because some months have 31 days he will be out for 1-2 days before he can get next refill.  Spoke with pharmacist. He states pt filled October Rx on 07/29/17. They called the office on 08/28/17 and received verbal auth to fill November Rx on 08/28/17. Pharmacist states they will have to call us back on 09/27/17 to get authorization to fill December Rx on that date or we could give him an Rx now.  He could keep the December Rx for the end of December and January Rx for end of January and to discard February Rx at this time. Pt left Rxs at office and would like Dr Abner GreenspanBlyth to address this when she returns tomorrow. Signed Rxs were placed back in PCP red folder.

## 2017-09-27 ENCOUNTER — Ambulatory Visit (INDEPENDENT_AMBULATORY_CARE_PROVIDER_SITE_OTHER): Payer: Medicare Other | Admitting: Family Medicine

## 2017-09-27 ENCOUNTER — Encounter: Payer: Self-pay | Admitting: Family Medicine

## 2017-09-27 VITALS — BP 98/70 | HR 76 | Temp 98.3°F | Resp 18 | Ht 74.0 in | Wt 168.8 lb

## 2017-09-27 DIAGNOSIS — R000 Tachycardia, unspecified: Secondary | ICD-10-CM | POA: Diagnosis not present

## 2017-09-27 DIAGNOSIS — Z720 Tobacco use: Secondary | ICD-10-CM

## 2017-09-27 DIAGNOSIS — E785 Hyperlipidemia, unspecified: Secondary | ICD-10-CM | POA: Diagnosis not present

## 2017-09-27 DIAGNOSIS — F908 Attention-deficit hyperactivity disorder, other type: Secondary | ICD-10-CM | POA: Diagnosis not present

## 2017-09-27 DIAGNOSIS — E162 Hypoglycemia, unspecified: Secondary | ICD-10-CM

## 2017-09-27 DIAGNOSIS — Z79899 Other long term (current) drug therapy: Secondary | ICD-10-CM | POA: Diagnosis not present

## 2017-09-27 DIAGNOSIS — Z23 Encounter for immunization: Secondary | ICD-10-CM

## 2017-09-27 DIAGNOSIS — F411 Generalized anxiety disorder: Secondary | ICD-10-CM

## 2017-09-27 DIAGNOSIS — F1011 Alcohol abuse, in remission: Secondary | ICD-10-CM | POA: Diagnosis not present

## 2017-09-27 LAB — COMPREHENSIVE METABOLIC PANEL
ALBUMIN: 4.7 g/dL (ref 3.5–5.2)
ALK PHOS: 65 U/L (ref 39–117)
ALT: 31 U/L (ref 0–53)
AST: 27 U/L (ref 0–37)
BILIRUBIN TOTAL: 0.4 mg/dL (ref 0.2–1.2)
BUN: 9 mg/dL (ref 6–23)
CALCIUM: 10 mg/dL (ref 8.4–10.5)
CHLORIDE: 100 meq/L (ref 96–112)
CO2: 33 mEq/L — ABNORMAL HIGH (ref 19–32)
CREATININE: 0.92 mg/dL (ref 0.40–1.50)
GFR: 99.45 mL/min (ref >60.00)
Glucose, Bld: 88 mg/dL (ref 70–99)
Potassium: 4.5 mEq/L (ref 3.5–5.1)
Sodium: 139 mEq/L (ref 135–145)
TOTAL PROTEIN: 7.2 g/dL (ref 6.0–8.3)

## 2017-09-27 MED ORDER — AMPHETAMINE-DEXTROAMPHETAMINE 20 MG PO TABS: 20.0000 mg | ORAL_TABLET | Freq: Two times a day (BID) | ORAL | 0 refills | Status: DC

## 2017-09-27 NOTE — Assessment & Plan Note (Signed)
Doing well on current meds, still avoids social situation

## 2017-09-27 NOTE — Progress Notes (Signed)
Subjective:  I acted as a Neurosurgeonscribe for Dr. Abner GreenspanBlyth. Princess, [place]MA  Patient ID: Jesse KielMatthew A Jones, male    DOB: May 08, 1981, 36 y.o.   MRN: [id number]  No chief complaint on file.   HPI  Patient is in today for an annual exam and follow-up on chronic medical conditions.  Overall he is doing well.  He reports his generalized anxiety disorder is manageable but it does still keep him from doing some activities he would like to do such as attending his brothers wedding recently.  He has noted some acne on his back recently but is not itching or burning.  No recent febrile illness or acute concerns.  His Adderall continues to help with his ADHD and he denies any acute concerns or recent febrile illness. Denies CP/palp/SOB/HA/congestion/fevers/GI or GU c/o. Taking meds as prescribed  Patient Care Team: Bradd CanaryBlyth, Jet Traynham A, MD as PCP - General (Family Medicine) Christia ReadingBates, Dwight, MD as Consulting Physician (Otolaryngology)   Past Medical History:  Diagnosis Date  . ADD (attention deficit disorder)   . ADHD (attention deficit hyperactivity disorder) 04/02/2011  . 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 Anxiety   . 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 Concussion    X 6- 7  . 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 Depression   . ED (erectile dysfunction) 05/12/2012  . Hyperlipidemia, mild 04/18/2015  . Insomnia   . 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 Outbursts of anger   . Preventative health care 10/03/2011  . Tobacco abuse 04/28/2011    Past Surgical History:  Procedure Laterality Date  . NASAL SEPTUM SURGERY Bilateral    Dr Jenne PaneBates 2017  . punctured lung    . toe surgeries  during childhood    for ingrown toenails    Family History  Problem Relation Age of Onset  . Depression Mother   . Anxiety disorder Mother   . Depression Brother   . Diabetes Brother        type 1  . Cancer Paternal Grandmother        lung/ didn't smoke    Social History   Socioeconomic History  . Marital status:  Single    Spouse name: Not on file  . Number of children: Not on file  . Years of education: Not on file  . Highest education level: Not on file  Social Needs  . Financial resource strain: Not on file  . Food insecurity - worry: Not on file  . Food insecurity - inability: Not on file  . Transportation needs - medical: Not on file  . Transportation needs - non-medical: Not on file  Occupational History  . Not on file  Tobacco Use  . Smoking status: Current Every Day Smoker    Packs/day: 0.30    Years: 20.00    Pack years: 6.00    Types: Cigarettes  . Smokeless tobacco: Never Used  Substance and Sexual Activity  . Alcohol use: Yes    Alcohol/week: 21.6 oz    Types: 36 Cans of beer per week    Comment: sober [id number]. No alcohol.  . Drug use: Yes    Types: Marijuana, Other-see comments    Comment: pt stopped 4 year ago  . Sexual activity: Yes    Partners: Female    Comment: lives alone and eating well. exercising  Other Topics Concern  . Not on file  Social History Narrative  . Not on file    Outpatient Medications Prior to Visit  Medication Sig Dispense Refill  . ALPRAZolam (XANAX XR) 3 MG 24 hr tablet Take 1 tablet (3 mg total) every morning by mouth. 30 tablet 0  . cholecalciferol (VITAMIN D) 1000 UNITS tablet Take 1,000 Units by mouth every morning.    . citalopram (CELEXA) 20 MG tablet TAKE 1 TABLET BY MOUTH EVERY DAY 90 tablet 1  . clindamycin-benzoyl peroxide (BENZACLIN) gel Apply topically 2 (two) times daily. 25 g 2  . fish oil-omega-3 fatty acids 1000 MG capsule Take 2 g by mouth daily.    . fluticasone (FLONASE) 50 MCG/ACT nasal spray Place 2 sprays into both nostrils daily as needed for allergies or rhinitis. 16 g 6  . Multiple Vitamin (MULTIVITAMIN) tablet Take 1 tablet by mouth every morning.     . nicotine (NICODERM CQ) 7 mg/24hr patch Place 1 patch (7 mg total) onto the skin daily. 28 patch 1  . vitamin B-12 (CYANOCOBALAMIN) 1000 MCG tablet Take 1,000 mcg by  mouth every morning.    . vitamin C (ASCORBIC ACID) 500 MG tablet Take 500 mg by mouth daily.    Marland Kitchen. amphetamine-dextroamphetamine (ADDERALL) 20 MG tablet Take 1 tablet (20 mg total) by mouth 2 (two) times daily. December 2018 60 tablet 0  . amphetamine-dextroamphetamine (ADDERALL) 20 MG tablet Take 1 tablet (20 mg total) by mouth 2 (two) times daily. February 2019 60 tablet 0  . amphetamine-dextroamphetamine (ADDERALL) 20 MG tablet Take 1 tablet (20 mg total) by mouth 2 (two) times daily. January 2019 60 tablet 0   No facility-administered medications prior to visit.     Allergies  Allergen Reactions  . Haloperidol Decanoate   . Hydroxyzine Anxiety    EXCESSIVE ANXIOUNESS  . Other Other (See Comments)    Reverse effect, super hyper  . Penicillins Hives  . Valium     Review of Systems  Constitutional: Negative for fever.  HENT: Negative for congestion.   Eyes: Negative for blurred vision.  Respiratory: Negative for cough.   Cardiovascular: Negative for chest pain and palpitations.  Gastrointestinal: Negative for vomiting.  Musculoskeletal: Negative for back pain.  Skin: Positive for rash.  Neurological: Negative for loss of consciousness and headaches.  Psychiatric/Behavioral: The patient is nervous/anxious.        Objective:    Physical Exam  Constitutional: He is oriented to person, place, and time. He appears well-developed and well-nourished. No distress.  HENT:  Head: Normocephalic and atraumatic.  Eyes: Conjunctivae are normal.  Neck: Neck supple. No thyromegaly present.  Cardiovascular: Normal rate, regular rhythm and normal heart sounds.  No murmur heard. Pulmonary/Chest: Effort normal and breath sounds normal. No respiratory distress. He has no wheezes.  Abdominal: Soft. Bowel sounds are normal. He exhibits no mass. There is no tenderness.  Musculoskeletal: He exhibits deformity. He exhibits no edema.  Lymphadenopathy:    He has no cervical adenopathy.    Neurological: He is alert and oriented to person, place, and time.  Skin: Skin is warm and dry.  Psychiatric: He has a normal mood and affect. His behavior is normal.    BP 98/70 (BP Location: Left Arm, Patient Position: Sitting, Cuff Size: Normal)   Pulse 76   Temp 98.3 F (36.8 C) (Oral)   Resp 18   Ht 6\' 2"  (1.88 m)   Wt 168 lb 12.8 oz (76.6 kg)   SpO2 98%   BMI 21.67 kg/m  Wt Readings from Last 3 Encounters:  09/27/17 168 lb 12.8 oz (76.6 kg)  03/26/17 171 lb  12.8 oz (77.9 kg)  10/02/16 173 lb 6.4 oz (78.7 kg)   BP Readings from Last 3 Encounters:  09/27/17 98/70  03/26/17 107/84  10/02/16 114/80     Immunization History  Administered Date(s) Administered  . Influenza Split 07/13/2011, 09/29/2012  . Influenza,inj,Quad PF,6+ Mos 09/13/2014, 08/05/2015, 09/27/2017  . Influenza-Unspecified 08/24/2016  . Tdap 10/03/2011    Health Maintenance  Topic Date Due  . HIV Screening  08/20/1997  . TETANUS/TDAP  10/02/2021  . INFLUENZA VACCINE  Completed    Lab Results  Component Value Date   WBC 8.3 03/26/2017   HGB 16.1 03/26/2017   HCT 47.3 03/26/2017   PLT 259.0 03/26/2017   GLUCOSE 88 09/27/2017   CHOL 154 03/26/2017   TRIG 107.0 03/26/2017   HDL 61.40 03/26/2017   LDLCALC 71 03/26/2017   ALT 31 09/27/2017   AST 27 09/27/2017   NA 139 09/27/2017   K 4.5 09/27/2017   CL 100 09/27/2017   CREATININE 0.92 09/27/2017   BUN 9 09/27/2017   CO2 33 (H) 09/27/2017   TSH 3.64 03/26/2017    Lab Results  Component Value Date   TSH 3.64 03/26/2017   Lab Results  Component Value Date   WBC 8.3 03/26/2017   HGB 16.1 03/26/2017   HCT 47.3 03/26/2017   MCV 87.0 03/26/2017   PLT 259.0 03/26/2017   Lab Results  Component Value Date   NA 139 09/27/2017   K 4.5 09/27/2017   CO2 33 (H) 09/27/2017   GLUCOSE 88 09/27/2017   BUN 9 09/27/2017   CREATININE 0.92 09/27/2017   BILITOT 0.4 09/27/2017   ALKPHOS 65 09/27/2017   AST 27 09/27/2017   ALT 31 09/27/2017    PROT 7.2 09/27/2017   ALBUMIN 4.7 09/27/2017   CALCIUM 10.0 09/27/2017   GFR 99.45 09/27/2017   Lab Results  Component Value Date   CHOL 154 03/26/2017   Lab Results  Component Value Date   HDL 61.40 03/26/2017   Lab Results  Component Value Date   LDLCALC 71 03/26/2017   Lab Results  Component Value Date   TRIG 107.0 03/26/2017   Lab Results  Component Value Date   CHOLHDL 3 03/26/2017   No results found for: HGBA1C       Assessment & Plan:   Problem List Items Addressed This Visit    ADHD (attention deficit hyperactivity disorder)    Doing well on current meds, no changes today      GAD (generalized anxiety disorder)    Doing well on current meds, still avoids social situation      Tobacco abuse    Encouraged complete cessation. Discussed need to quit as relates to risk of numerous cancers, cardiac and pulmonary disease as well as neurologic complications. Counseled for greater than 3 minutes. Has cut down to nearly a 1/4 ppd and is trying to quit. Is using nicotine gum prn.       Tachycardia    RRR today      Alcohol abuse, in remission    Continues to abstain.       Hyperlipidemia, mild    Encouraged heart healthy diet, increase exercise, avoid trans fats, consider a krill oil cap daily       Other Visit Diagnoses    Needs flu shot    -  Primary   Relevant Orders   Flu Vaccine QUAD 6+ mos PF IM (Fluarix Quad PF) (Completed)   Hypoglycemia  Relevant Orders   Comprehensive metabolic panel (Completed)   High risk medication use       Relevant Orders   Pain Mgmt, Profile 8 w/Conf, U      I am having Jesse Jones "Jesse Jones" maintain his multivitamin, vitamin B-12, cholecalciferol, fish oil-omega-3 fatty acids, vitamin C, fluticasone, clindamycin-benzoyl peroxide, nicotine, citalopram, and ALPRAZolam.  No orders of the defined types were placed in this encounter.   CMA served as Neurosurgeon during this visit. History, Physical and Plan  performed by medical provider. Documentation and orders reviewed and attested to.  Danise Edge, MD

## 2017-09-27 NOTE — Assessment & Plan Note (Signed)
RRR today 

## 2017-09-27 NOTE — Telephone Encounter (Signed)
Patient has appointment with PCP.

## 2017-09-27 NOTE — Patient Instructions (Addendum)
Cetaphil soap   Attention Deficit Hyperactivity Disorder, Pediatric Attention deficit hyperactivity disorder (ADHD) is a condition that can make it hard for a child to pay attention and concentrate or to control his or her behavior. The child may also have a lot of energy. ADHD is a disorder of the brain (neurodevelopmental disorder), and symptoms are typically first seen in early childhood. It is a common reason for behavioral and academic problems in school. There are three main types of ADHD:  Inattentive. With this type, children have difficulty paying attention.  Hyperactive-impulsive. With this type, children have a lot of energy and have difficulty controlling their behavior.  Combination. This type involves having symptoms of both of the other types.  ADHD is a lifelong condition. If it is not treated, the disorder can affect a child's future academic achievement, employment, and relationships. What are the causes? The exact cause of this condition is not known. What increases the risk? This condition is more likely to develop in:  Children who have a first-degree relative, such as a parent or brother or sister, with the condition.  Children who had a low birth weight.  Children whose mothers had problems during pregnancy or used alcohol or tobacco during pregnancy.  Children who have had a brain infection or a head injury.  Children who have been exposed to lead.  What are the signs or symptoms? Symptoms of this condition depend on the type of ADHD. Symptoms are listed here for each type: Inattentive  Problems with organization.  Difficulty staying focused.  Problems completing assignments at school.  Often making simple mistakes.  Problems sustaining mental effort.  Not listening to instructions.  Losing things often.  Forgetting things often.  Being easily distracted. Hyperactive-impulsive  Fidgeting often.  Difficulty sitting still in one's  seat.  Talking a lot.  Talking out of turn.  Interrupting others.  Difficulty relaxing or doing quiet activities.  High energy levels and constant movement.  Difficulty waiting.  Always "on the go." Combination  Having symptoms of both of the other types. Children with ADHD may feel frustrated with themselves and may find school to be particularly discouraging. They often perform below their abilities in school. As children get older, the excess movement can lessen, but the problems with paying attention and staying organized often continue. Most children do not outgrow ADHD, but with good treatment, they can learn to cope with the symptoms. How is this diagnosed? This condition is diagnosed based on a child's symptoms and academic history. The child's health care provider will do a complete assessment. As part of the assessment, the health care provider will ask the child questions and will ask the parents and teachers for their observations of the child. The health care provider looks for specific symptoms of ADHD. Diagnosis will include:  Ruling out other reasons for the child's behavior.  Reviewing behavior rating scales that have been filled out about the child by people who deal with the child on a daily basis.  A diagnosis is made only after all information from multiple people has been considered. How is this treated? Treatment for this condition may include:  Behavior therapy.  Medicines to decrease impulsivity and hyperactivity and to increase attention. Behavior therapy is preferred for children younger than 35 years old. The combination of medicine and behavior therapy is most effective for children older than 696 years of age.  Tutoring or extra support at school.  Techniques for parents to use at home to help  manage their child's symptoms and behavior.  Follow these instructions at home: Eating and drinking  Offer your child a well-balanced diet. Breakfast that  includes a balance of whole grains, protein, and fruits or vegetables is especially important for school performance.  If your child has trouble with hyperactivity, have your child avoid drinks that contain caffeine. These include: ? Soft drinks. ? Coffee. ? Tea.  If your child is older and finds that caffeinated drinks help to improve his or her attention, talk with your child's health care provider about what amount of caffeine intake is a safe for your child. Lifestyle   Make sure your child gets a full night of sleep and regular daily exercise.  Help manage your child's behavior by following the techniques learned in therapy. These may include: ? Looking for good behavior and rewarding it. ? Making rules for behavior that your child can understand and follow. ? Giving clear instructions. ? Responding consistently to your child's challenging behaviors. ? Setting realistic goals. ? Looking for activities that can lead to success and self-esteem. ? Making time for pleasant activities with your child. ? Giving lots of affection.  Help your child learn to be organized. Some ways to do this include: ? Keeping daily schedules the same. Have a regular wake-up time and bedtime for your child. Schedule all activities, including time for homework and time for play. Post the schedule in a place where your child will see it. Mark schedule changes in advance. ? Having a regular place for your child to store items such as clothing, backpacks, and school supplies. ? Encouraging your child to write down school assignments and to bring home needed books. Work with your child's teachers for assistance in organizing school work. General instructions  Learn as much as you can about ADHD. This will improve your ability to help your child and to make sure he or she gets the support needed. It will also help you educate your child's teachers and instructors if they do not feel that they have adequate  knowledge or experience in these areas.  Work with your child's teachers to make sure your child gets the support and extra help that is needed. This may include: ? Tutoring. ? Teacher cues to help your child remain on task. ? Seating changes so your child is working at a desk that is free from distractions.  Give over-the-counter and prescription medicines only as told by your child's health care provider.  Keep all follow-up visits as told by your health care provider. This is important. Contact a health care provider if:  Your child has repeated muscle twitches (tics), coughs, or speech outbursts.  Your child has sleep problems.  Your child has a marked loss of appetite.  Your child develops depression.  Your child has new or worsening behavioral problems.  Your child has dizziness.  Your child has a racing heart.  Your child has stomach pains.  Your child develops headaches. Get help right away if:  Your child talks about or threatens suicide.  You are worried that your child is having a bad reaction to a medicine that he or she is taking for ADHD. This information is not intended to replace advice given to you by your health care provider. Make sure you discuss any questions you have with your health care provider. Document Released: 10/05/2002 Document Revised: 06/13/2016 Document Reviewed: 05/10/2016 Elsevier Interactive Patient Education  2017 [employer]

## 2017-09-29 NOTE — Assessment & Plan Note (Signed)
Doing well on current meds, no changes today

## 2017-09-29 NOTE — Assessment & Plan Note (Signed)
Continues to abstain 

## 2017-09-29 NOTE — Assessment & Plan Note (Signed)
Encouraged complete cessation. Discussed need to quit as relates to risk of numerous cancers, cardiac and pulmonary disease as well as neurologic complications. Counseled for greater than 3 minutes. Has cut down to nearly a 1/4 ppd and is trying to quit. Is using nicotine gum prn.

## 2017-09-29 NOTE — Assessment & Plan Note (Signed)
Encouraged heart healthy diet, increase exercise, avoid trans fats, consider a krill oil cap daily 

## 2017-09-30 NOTE — Telephone Encounter (Signed)
Patient has rx to last him until February 2019

## 2017-10-02 LAB — PAIN MGMT, PROFILE 8 W/CONF, U
6 ACETYLMORPHINE: NEGATIVE ng/mL (ref <10)
AMINOCLONAZEPAM: NEGATIVE ng/mL (ref <25)
AMPHETAMINES: NEGATIVE ng/mL (ref <500)
Alcohol Metabolites: NEGATIVE ng/mL (ref <500)
Alphahydroxyalprazolam: 229 ng/mL — ABNORMAL HIGH (ref <25)
Alphahydroxymidazolam: NEGATIVE ng/mL (ref <50)
Alphahydroxytriazolam: NEGATIVE ng/mL (ref <50)
BENZODIAZEPINES: POSITIVE ng/mL — AB (ref <100)
Buprenorphine, Urine: NEGATIVE ng/mL (ref <5)
CREATININE: 30.8 mg/dL
Cocaine Metabolite: NEGATIVE ng/mL (ref <150)
Hydroxyethylflurazepam: NEGATIVE ng/mL (ref <50)
LORAZEPAM: NEGATIVE ng/mL (ref <50)
MARIJUANA METABOLITE: NEGATIVE ng/mL (ref <20)
MDMA: NEGATIVE ng/mL (ref <500)
Nordiazepam: NEGATIVE ng/mL (ref <50)
OPIATES: NEGATIVE ng/mL (ref <100)
OXIDANT: NEGATIVE ug/mL (ref <200)
OXYCODONE: NEGATIVE ng/mL (ref <100)
Oxazepam: NEGATIVE ng/mL (ref <50)
PH: 7.62 (ref 4.5–9.0)
TEMAZEPAM: NEGATIVE ng/mL (ref <50)

## 2017-10-04 DIAGNOSIS — H5213 Myopia, bilateral: Secondary | ICD-10-CM | POA: Diagnosis not present

## 2017-10-04 DIAGNOSIS — H52203 Unspecified astigmatism, bilateral: Secondary | ICD-10-CM | POA: Diagnosis not present

## 2017-10-11 ENCOUNTER — Telehealth: Payer: Self-pay | Admitting: Family Medicine

## 2017-10-11 NOTE — Telephone Encounter (Signed)
Copied from CRM [phone]. Topic: Quick Communication - See Telephone Encounter >> Oct 11, 2017  2:47 PM Rudi CocoLathan, Alexzavier Girardin M, NT wrote: CRM for notification. See Telephone encounter for:   10/11/17. Pt calling to request refill on xanax (3mg  24 hour tablet)  [id number] [place] [place] - [address] [place] [id number] Phone: [phone] Fax: [phone] Not a 24 hour pharmacy; exact hours not known

## 2017-10-13 NOTE — Telephone Encounter (Signed)
OK to refill requested med if due with 30 day supply and 4 rf

## 2017-10-14 MED ORDER — ALPRAZOLAM ER 3 MG PO TB24: 3.0000 mg | ORAL_TABLET | Freq: Every morning | ORAL | 4 refills | Status: DC

## 2017-10-14 NOTE — Telephone Encounter (Signed)
Called patient left message for patient to call the office back.  I need to know if he wants to come pick his medication up or does he want it faxed.  Please verify pharmacy

## 2017-10-14 NOTE — Telephone Encounter (Signed)
Patient was utd with all the requirements in order for him to receive his medication

## 2017-11-05 ENCOUNTER — Encounter: Payer: Self-pay | Admitting: Family Medicine

## 2017-11-06 ENCOUNTER — Other Ambulatory Visit: Payer: Self-pay

## 2017-11-06 MED ORDER — CITALOPRAM HYDROBROMIDE 20 MG PO TABS: 20.0000 mg | ORAL_TABLET | Freq: Every day | ORAL | 1 refills | Status: DC

## 2017-11-13 ENCOUNTER — Other Ambulatory Visit: Payer: Self-pay | Admitting: Family Medicine

## 2017-11-14 NOTE — Telephone Encounter (Signed)
Requesting:xanax Contract:yes  ZOX:[id number]:moderate risk next screen 03/29/18 /Last OV:09/27/17 Next OV:03/27/18 Last Refill: 10/14/17 #[id number]   Please advise      Called pharmacy and added the refills

## 2017-11-26 ENCOUNTER — Telehealth: Payer: Self-pay | Admitting: Family Medicine

## 2017-11-26 ENCOUNTER — Other Ambulatory Visit: Payer: Self-pay | Admitting: Family Medicine

## 2017-11-26 DIAGNOSIS — F988 Other specified behavioral and emotional disorders with onset usually occurring in childhood and adolescence: Secondary | ICD-10-CM

## 2017-11-26 NOTE — Telephone Encounter (Signed)
This patient should not need a refill until March.  In November 2018 we gave him 3rx for Dec. Jan and Feb 2019.  If he has lost his prescription per his controlled substances that he signed we can not fill if it is lost, misplaced or stolen. He will have to wait until March 2019

## 2017-11-26 NOTE — Telephone Encounter (Signed)
Copied from CRM [phone] [phone]. Topic: Quick Communication - Rx Refill/Question >> Nov 26, 2017  1:28 PM Jesse Jones, Jesse Jones, [place] wrote: Medication: adderall   Has the patient contacted their pharmacy? yes   (Agent: If no, request that the patient contact the pharmacy for the refill.)   Preferred Pharmacy (with phone number or street name): [id number] [place], [place] - [address] [address] [place] [id number] Phone: [phone] Fax: [phone]     Agent: Please be advised that RX refills may take up to 3 business days. We ask that you follow-up with your pharmacy.

## 2017-11-27 ENCOUNTER — Telehealth: Payer: Self-pay

## 2017-11-27 ENCOUNTER — Encounter: Payer: Self-pay | Admitting: Family Medicine

## 2017-11-27 MED ORDER — AMPHETAMINE-DEXTROAMPHETAMINE 20 MG PO TABS: 20.0000 mg | ORAL_TABLET | Freq: Two times a day (BID) | ORAL | 0 refills | Status: DC

## 2017-11-27 NOTE — Telephone Encounter (Signed)
Needs update UDS, check data base. OK to refill

## 2017-11-27 NOTE — Telephone Encounter (Signed)
Requesting: Adderall Contract: 09/27/17 UDS:03/22/17 Last Visit: 09/27/17 Next Visit: 03/27/18 Last Refill: 10/14/17  Please Advise

## 2017-11-27 NOTE — Telephone Encounter (Signed)
PA initiated via Covermymeds; KEY: [id number]. Awaiting determination.

## 2017-11-28 NOTE — Telephone Encounter (Signed)
See phone note 11/26/17  Patient is not due for new rx until March 2019 he received 3 prescriptions in November for December 2018, January 2019 and February 2019.   Ive called him several times to see why he needs this medication so soon unable to leave voicemail.

## 2017-11-29 NOTE — Telephone Encounter (Signed)
Patient states he used to take Clonazepam years ago and stated you and him had discussed  he felt as if he was getting immune to it, so that's when he switched to the Xanax.  He states he has not ever taken Diazepam and does not know how comfortable he would feel taking something he has never taking because he is afraid of an allergic reaction, bc of his allergies.    He stts he will do what ever it takes because he does not want no withdraws from the xanax.    To date he has 23 Xanax left.

## 2017-11-29 NOTE — Telephone Encounter (Signed)
PA denied. Insurance needs letter as to why Pt can not take diazepam or clonazepam.

## 2017-11-29 NOTE — Telephone Encounter (Signed)
So check with him and see if he has ever taken Clonazepam or Diazepam and if he did why did they not work. If he has not he will have to try them per his insurance.

## 2017-12-01 NOTE — Telephone Encounter (Signed)
Please let him know insurance is saying he has to try Diazepam. Write him a prescription Diazepam 10 mg tabs, 1 tab po bid disp #30 to start til we see if it works. If it does not he has to tell us what the problem is and then we might be able to get the Alprazolam back

## 2017-12-03 ENCOUNTER — Other Ambulatory Visit: Payer: Self-pay | Admitting: Family Medicine

## 2017-12-03 MED ORDER — DIAZEPAM 10 MG PO TABS: 10.0000 mg | ORAL_TABLET | Freq: Two times a day (BID) | ORAL | 0 refills | Status: DC | PRN

## 2017-12-24 ENCOUNTER — Telehealth: Payer: Self-pay | Admitting: Family Medicine

## 2017-12-24 ENCOUNTER — Other Ambulatory Visit: Payer: Self-pay | Admitting: Family Medicine

## 2017-12-24 DIAGNOSIS — F988 Other specified behavioral and emotional disorders with onset usually occurring in childhood and adolescence: Secondary | ICD-10-CM

## 2017-12-24 NOTE — Telephone Encounter (Signed)
Copied from CRM [id number]. Topic: Quick Communication - Rx Refill/Question >> Dec 24, 2017  4:43 PM Landry MellowFoltz, Melissa J wrote: Medication: amphetamine-dextroamphetamine (ADDERALL) 20 MG tabl   Has the patient contacted their pharmacy? Yes.  Was told to call    (Agent: If no, request that the patient contact the pharmacy for the refill.)   Preferred Pharm[facility] (with phone number [facility] [address]  Cb is [phone]   Agent: Please be advised that RX refills may take up to 3 business days. We ask that you follow-up with your pharmacy.

## 2017-12-25 MED ORDER — AMPHETAMINE-DEXTROAMPHETAMINE 20 MG PO TABS: 20.0000 mg | ORAL_TABLET | Freq: Two times a day (BID) | ORAL | 0 refills | Status: DC

## 2017-12-25 NOTE — Telephone Encounter (Signed)
Dr Abner GreenspanBlyth-- please advise or approve refills?  Last adderall RX: 11/29/17, #60 Last OV: 09/27/17 Next OV: 03/27/18 UDS: 09/27/17, moderate. Due 03/29/18 CSC: 12/27/16 CSR: No discrepancies identified

## 2017-12-26 ENCOUNTER — Other Ambulatory Visit: Payer: Self-pay

## 2017-12-26 DIAGNOSIS — F988 Other specified behavioral and emotional disorders with onset usually occurring in childhood and adolescence: Secondary | ICD-10-CM

## 2017-12-26 MED ORDER — AMPHETAMINE-DEXTROAMPHETAMINE 20 MG PO TABS: 20.0000 mg | ORAL_TABLET | Freq: Two times a day (BID) | ORAL | 0 refills | Status: DC

## 2017-12-26 NOTE — Telephone Encounter (Signed)
Requesting:adderall  Contract:yes GLO:VFIEPPIRDS:Moderate risk net screen 03/29/18 Last OV:09/27/17 Next OV:03/27/18 Last Refill:09/27/17 Dec, Jan, Feb Database:no concerns    Please advise

## 2018-01-10 ENCOUNTER — Other Ambulatory Visit: Payer: Self-pay | Admitting: Family Medicine

## 2018-01-10 NOTE — Telephone Encounter (Signed)
Copied from CRM [id number]. Topic: Quick Communication - See Telephone Encounter >> Jan 10, 2018 11:41 AM Jolayne Hainesaylor, Brittany L wrote: CRM for notification. See Telephone encounter for:   01/10/18.  diazepam (VALIUM) 10 MG tablet [id number] [place], [place] - [address] [facility] [address]

## 2018-01-10 NOTE — Telephone Encounter (Signed)
LOV:09/27/17  Dr. Blyth  [facility] [address] 

## 2018-01-12 ENCOUNTER — Other Ambulatory Visit: Payer: Self-pay | Admitting: Family Medicine

## 2018-01-13 ENCOUNTER — Other Ambulatory Visit: Payer: Self-pay | Admitting: Family Medicine

## 2018-01-13 DIAGNOSIS — F988 Other specified behavioral and emotional disorders with onset usually occurring in childhood and adolescence: Secondary | ICD-10-CM

## 2018-01-13 NOTE — Telephone Encounter (Signed)
Pt called to check the status of Rx, check note below

## 2018-01-13 NOTE — Telephone Encounter (Signed)
Copied from CRM [phone]. Topic: Quick Communication - Rx Refill/Question >> Jan 13, 2018 12:50 PM Gerrianne ScalePayne, Donathan Buller L wrote: Medication: diazepam (VALIUM) 10 MG tablet   Has the patient contacted their pharmacy? Yes.     (Agent: If no, request that the patient contact the pharmacy for the refill.)   Preferred Pharmacy (with phone number or street name): [id number] [place], [place] - [address] [facility] [address] [phone] (Phone) [phone])     Agent: Please be advised that RX refills may take up to 3 business days. We ask that you follow-up with your pharmacy.

## 2018-01-13 NOTE — Telephone Encounter (Signed)
LOV:09/27/17  Dr. Abner GreenspanBlyth  [facility] [address]

## 2018-01-14 ENCOUNTER — Other Ambulatory Visit: Payer: Self-pay | Admitting: Family Medicine

## 2018-01-14 MED ORDER — DIAZEPAM 10 MG PO TABS: 10.0000 mg | ORAL_TABLET | Freq: Two times a day (BID) | ORAL | 0 refills | Status: DC | PRN

## 2018-01-14 MED ORDER — DIAZEPAM 10 MG PO TABS: 10.0000 mg | ORAL_TABLET | Freq: Two times a day (BID) | ORAL | 2 refills | Status: DC | PRN

## 2018-01-14 NOTE — Telephone Encounter (Signed)
Father of pt called and said pt is now out of medication and would like to have this filled today    [id number] [place], [place] - [address] [facility] [address] [facility] [address] [phone] (Phone) [phone] (Fax)

## 2018-01-14 NOTE — Telephone Encounter (Signed)
Requesting: Valium Contract:09/27/17 UDS:09/27/17 moderate risk Last Visit: 09/27/17 Next Visit:03/27/18 Last Refill:12/03/17  Please Advise

## 2018-01-21 ENCOUNTER — Other Ambulatory Visit: Payer: Self-pay | Admitting: Family Medicine

## 2018-01-21 DIAGNOSIS — F988 Other specified behavioral and emotional disorders with onset usually occurring in childhood and adolescence: Secondary | ICD-10-CM

## 2018-01-28 NOTE — Telephone Encounter (Signed)
Refilled in March for 3 months. Rx sent to the pharmacy

## 2018-03-27 ENCOUNTER — Encounter: Payer: Self-pay | Admitting: Family Medicine

## 2018-03-27 ENCOUNTER — Ambulatory Visit (INDEPENDENT_AMBULATORY_CARE_PROVIDER_SITE_OTHER): Payer: Medicare Other | Admitting: Family Medicine

## 2018-03-27 VITALS — BP 100/62 | HR 83 | Temp 98.1°F | Resp 18 | Ht 74.0 in | Wt 170.0 lb

## 2018-03-27 DIAGNOSIS — Z720 Tobacco use: Secondary | ICD-10-CM

## 2018-03-27 DIAGNOSIS — F1011 Alcohol abuse, in remission: Secondary | ICD-10-CM | POA: Diagnosis not present

## 2018-03-27 DIAGNOSIS — Z79899 Other long term (current) drug therapy: Secondary | ICD-10-CM

## 2018-03-27 DIAGNOSIS — F988 Other specified behavioral and emotional disorders with onset usually occurring in childhood and adolescence: Secondary | ICD-10-CM

## 2018-03-27 DIAGNOSIS — F411 Generalized anxiety disorder: Secondary | ICD-10-CM | POA: Diagnosis not present

## 2018-03-27 DIAGNOSIS — F908 Attention-deficit hyperactivity disorder, other type: Secondary | ICD-10-CM

## 2018-03-27 DIAGNOSIS — R000 Tachycardia, unspecified: Secondary | ICD-10-CM

## 2018-03-27 MED ORDER — NICOTINE 7 MG/24HR TD PT24: 7.0000 mg | MEDICATED_PATCH | Freq: Every day | TRANSDERMAL | 1 refills | Status: DC

## 2018-03-27 MED ORDER — AMPHETAMINE-DEXTROAMPHETAMINE 20 MG PO TABS: 20.0000 mg | ORAL_TABLET | Freq: Two times a day (BID) | ORAL | 0 refills | Status: DC

## 2018-03-27 NOTE — Patient Instructions (Signed)

## 2018-03-27 NOTE — Progress Notes (Signed)
Subjective:  I acted as a Neurosurgeon for Dr. Abner Greenspan. [place], [place]  Patient ID: Jesse Jones, male    DOB: 01/09/1981, 37 y.o.   MRN: [id number]  No chief complaint on file.   HPI  Patient is in today for a 6 month follow up and he is doing well. No recent febrile illness or hospitalizations. He is actively cutting down on his cigarette intake. Only smoking about 5 cigarettes daily. No difficulty with cough. Denies CP/palp/SOB/HA/congestion/fevers/GI or GU c/o. Taking meds as prescribed  Patient Care Team: Bradd Canary, MD as PCP - General (Family Medicine) Christia Reading, MD as Consulting Physician (Otolaryngology)   Past Medical History:  Diagnosis Date  . ADD (attention deficit disorder)   . ADHD (attention deficit hyperactivity disorder) 04/02/2011  . 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 Anxiety   . 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 Concussion    X 6- 7  . 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 Depression   . ED (erectile dysfunction) 05/12/2012  . Hyperlipidemia, mild 04/18/2015  . Insomnia   . 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 Outbursts of anger   . Preventative health care 10/03/2011  . Tobacco abuse 04/28/2011    Past Surgical History:  Procedure Laterality Date  . NASAL SEPTUM SURGERY Bilateral    Dr Jenne Pane 2017  . punctured lung    . toe surgeries  during childhood    for ingrown toenails    Family History  Problem Relation Age of Onset  . Depression Mother   . Anxiety disorder Mother   . Depression Brother   . Diabetes Brother        type 1  . Cancer Paternal Grandmother        lung/ didn't smoke    Social History   Socioeconomic History  . Marital status: Single    Spouse name: Not on file  . Number of children: Not on file  . Years of education: Not on file  . Highest education level: Not on file  Occupational History  . Not on file  Social Needs  . Financial resource strain: Not on file  . Food insecurity:    Worry: Not on file      Inability: Not on file  . Transportation needs:    Medical: Not on file    Non-medical: Not on file  Tobacco Use  . Smoking status: Current Every Day Smoker    Packs/day: 0.30    Years: 20.00    Pack years: 6.00    Types: Cigarettes  . Smokeless tobacco: Never Used  Substance and Sexual Activity  . Alcohol use: Yes    Alcohol/week: 21.6 oz    Types: 36 Cans of beer per week    Comment: sober x26yr. No alcohol.  . Drug use: Yes    Types: Marijuana, Other-see comments    Comment: pt stopped 4 year ago  . Sexual activity: Yes    Partners: Female    Comment: lives alone and eating well. exercising  Lifestyle  . Physical activity:    Days per week: Not on file    Minutes per session: Not on file  . Stress: Not on file  Relationships  . Social connections:    Talks on phone: Not on file    Gets together: Not on file    Attends religious service: Not on file    Active member of club or organization: Not on file    Attends meetings  of clubs or organizations: Not on file    Relationship status: Not on file  . Intimate partner violence:    Fear of current or ex partner: Not on file    Emotionally abused: Not on file    Physically abused: Not on file    Forced sexual activity: Not on file  Other Topics Concern  . Not on file  Social History Narrative  . Not on file    Outpatient Medications Prior to Visit  Medication Sig Dispense Refill  . cholecalciferol (VITAMIN D) 1000 UNITS tablet Take 1,000 Units by mouth every morning.    . citalopram (CELEXA) 20 MG tablet Take 1 tablet (20 mg total) by mouth daily. 90 tablet 1  . clindamycin-benzoyl peroxide (BENZACLIN) gel Apply topically 2 (two) times daily. 25 g 2  . diazepam (VALIUM) 10 MG tablet Take 1 tablet (10 mg total) by mouth every 12 (twelve) hours as needed for anxiety. 60 tablet 2  . fish oil-omega-3 fatty acids 1000 MG capsule Take 2 g by mouth daily.    . fluticasone (FLONASE) 50 MCG/ACT nasal spray Place 2 sprays  into both nostrils daily as needed for allergies or rhinitis. 16 g 6  . Multiple Vitamin (MULTIVITAMIN) tablet Take 1 tablet by mouth every morning.     . vitamin B-12 (CYANOCOBALAMIN) 1000 MCG tablet Take 1,000 mcg by mouth every morning.    . vitamin C (ASCORBIC ACID) 500 MG tablet Take 500 mg by mouth daily.    Marland Kitchen amphetamine-dextroamphetamine (ADDERALL) 20 MG tablet Take 1 tablet (20 mg total) by mouth 2 (two) times daily. May 2019 60 tablet 0  . amphetamine-dextroamphetamine (ADDERALL) 20 MG tablet Take 1 tablet (20 mg total) by mouth 2 (two) times daily. April 2019 60 tablet 0  . amphetamine-dextroamphetamine (ADDERALL) 20 MG tablet Take 1 tablet (20 mg total) by mouth 2 (two) times daily. March 2019 60 tablet 0  . nicotine (NICODERM CQ) 7 mg/24hr patch Place 1 patch (7 mg total) onto the skin daily. 28 patch 1   No facility-administered medications prior to visit.     Allergies  Allergen Reactions  . Haloperidol Decanoate   . Hydroxyzine Anxiety    EXCESSIVE ANXIOUNESS  . Other Other (See Comments)    Reverse effect, super hyper  . Penicillins Hives  . Valium     Review of Systems  Constitutional: Negative for fever and malaise/fatigue.  HENT: Negative for congestion.   Eyes: Negative for blurred vision.  Respiratory: Negative for shortness of breath.   Cardiovascular: Negative for chest pain, palpitations and leg swelling.  Gastrointestinal: Negative for abdominal pain, blood in stool and nausea.  Genitourinary: Negative for dysuria and frequency.  Musculoskeletal: Negative for falls.  Skin: Negative for rash.  Neurological: Negative for dizziness, loss of consciousness and headaches.  Endo/Heme/Allergies: Negative for environmental allergies.  Psychiatric/Behavioral: Negative for depression. The patient is nervous/anxious.        Objective:    Physical Exam  Constitutional: He is oriented to person, place, and time. He appears well-developed and well-nourished. No  distress.  HENT:  Head: Normocephalic and atraumatic.  Nose: Nose normal.  Eyes: Right eye exhibits no discharge. Left eye exhibits no discharge.  Neck: Normal range of motion. Neck supple.  Cardiovascular: Normal rate and regular rhythm.  No murmur heard. Pulmonary/Chest: Effort normal and breath sounds normal.  Abdominal: Soft. Bowel sounds are normal. There is no tenderness.  Musculoskeletal: He exhibits no edema.  Neurological: He is alert and  oriented to person, place, and time.  Skin: Skin is warm and dry.  Psychiatric: He has a normal mood and affect.  Nursing note and vitals reviewed.   BP 100/62 (BP Location: Left Arm, Patient Position: Sitting, Cuff Size: Normal)   Pulse 83   Temp 98.1 F (36.7 C) (Oral)   Resp 18   Ht  (1.88 m)   Wt 170 lb (77.1 kg)   SpO2 98%   BMI 21.83 kg/m  Wt Readings from Last 3 Encounters:  03/27/18 170 lb (77.1 kg)  09/27/17 168 lb 12.8 oz (76.6 kg)  03/26/17 171 lb 12.8 oz (77.9 kg)   BP Readings from Last 3 Encounters:  03/27/18 100/62  09/27/17 98/70  03/26/17 107/84     Immunization History  Administered Date(s) Administered  . Influenza Split 07/13/2011, 09/29/2012  . Influenza,inj,Quad PF,6+ Mos 09/13/2014, 08/05/2015, 09/27/2017  . Influenza-Unspecified 08/24/2016  . Tdap 10/03/2011    Health Maintenance  Topic Date Due  . HIV Screening  08/20/1997  . INFLUENZA VACCINE  05/29/2018  . TETANUS/TDAP  10/02/2021    Lab Results  Component Value Date   WBC 8.3 03/26/2017   HGB 16.1 03/26/2017   HCT 47.3 03/26/2017   PLT 259.0 03/26/2017   GLUCOSE 88 09/27/2017   CHOL 154 03/26/2017   TRIG 107.0 03/26/2017   HDL 61.40 03/26/2017   LDLCALC 71 03/26/2017   ALT 31 09/27/2017   AST 27 09/27/2017   NA 139 09/27/2017   K 4.5 09/27/2017   CL 100 09/27/2017   CREATININE 0.92 09/27/2017   BUN 9 09/27/2017   CO2 33 (H) 09/27/2017   TSH 3.64 03/26/2017    Lab Results  Component Value Date   TSH 3.64 03/26/2017     Lab Results  Component Value Date   WBC 8.3 03/26/2017   HGB 16.1 03/26/2017   HCT 47.3 03/26/2017   MCV 87.0 03/26/2017   PLT 259.0 03/26/2017   Lab Results  Component Value Date   NA 139 09/27/2017   K 4.5 09/27/2017   CO2 33 (H) 09/27/2017   GLUCOSE 88 09/27/2017   BUN 9 09/27/2017   CREATININE 0.92 09/27/2017   BILITOT 0.4 09/27/2017   ALKPHOS 65 09/27/2017   AST 27 09/27/2017   ALT 31 09/27/2017   PROT 7.2 09/27/2017   ALBUMIN 4.7 09/27/2017   CALCIUM 10.0 09/27/2017   GFR 99.45 09/27/2017   Lab Results  Component Value Date   CHOL 154 03/26/2017   Lab Results  Component Value Date   HDL 61.40 03/26/2017   Lab Results  Component Value Date   LDLCALC 71 03/26/2017   Lab Results  Component Value Date   TRIG 107.0 03/26/2017   Lab Results  Component Value Date   CHOLHDL 3 03/26/2017   No results found for: HGBA1C       Assessment & Plan:   Problem List Items Addressed This Visit    ADHD (attention deficit hyperactivity disorder)    Continues to do well on current meds. No changes.       GAD (generalized anxiety disorder)    Doing well on Citalopram      Tobacco abuse    Encouraged complete cessation. Discussed need to quit as relates to risk of numerous cancers, cardiac and pulmonary disease as well as neurologic complications. Counseled for greater than 3 minutes. Is down to 5 cigarettes most days.       Tachycardia    RRR today  Alcohol abuse, in remission    Continues to abstain.        Other Visit Diagnoses    High risk medication use    -  Primary   Relevant Orders   Pain Mgmt, Profile 8 w/Conf, U   Attention deficit disorder, unspecified hyperactivity presence       Relevant Medications   amphetamine-dextroamphetamine (ADDERALL) 20 MG tablet   amphetamine-dextroamphetamine (ADDERALL) 20 MG tablet   amphetamine-dextroamphetamine (ADDERALL) 20 MG tablet      I have discontinued [name] "Matt"'s nicotine. I  have also changed his amphetamine-dextroamphetamine, amphetamine-dextroamphetamine, and amphetamine-dextroamphetamine. Additionally, I am having him start on nicotine. Lastly, I am having him maintain his multivitamin, vitamin B-12, cholecalciferol, fish oil-omega-3 fatty acids, vitamin C, fluticasone, clindamycin-benzoyl peroxide, citalopram, and diazepam.  Meds ordered this encounter  Medications  . amphetamine-dextroamphetamine (ADDERALL) 20 MG tablet    Sig: Take 1 tablet (20 mg total) by mouth 2 (two) times daily. August 2019    Dispense:  60 tablet    Refill:  0  . amphetamine-dextroamphetamine (ADDERALL) 20 MG tablet    Sig: Take 1 tablet (20 mg total) by mouth 2 (two) times daily. July 2019    Dispense:  60 tablet    Refill:  0  . amphetamine-dextroamphetamine (ADDERALL) 20 MG tablet    Sig: Take 1 tablet (20 mg total) by mouth 2 (two) times daily. June 2019    Dispense:  60 tablet    Refill:  0  . nicotine (NICODERM CQ) 7 mg/24hr patch    Sig: Place 1 patch (7 mg total) onto the skin daily.    Dispense:  28 patch    Refill:  1    CMA served as Neurosurgeon during this visit. History, Physical and Plan performed by medical provider. Documentation and orders reviewed and attested to.  Danise Edge, MD

## 2018-03-30 NOTE — Assessment & Plan Note (Signed)
Encouraged complete cessation. Discussed need to quit as relates to risk of numerous cancers, cardiac and pulmonary disease as well as neurologic complications. Counseled for greater than 3 minutes. Is down to 5 cigarettes most days.

## 2018-03-30 NOTE — Assessment & Plan Note (Signed)
Doing well on Citalopram 

## 2018-03-30 NOTE — Assessment & Plan Note (Signed)
RRR today 

## 2018-03-30 NOTE — Assessment & Plan Note (Signed)
Continues to abstain 

## 2018-03-30 NOTE — Assessment & Plan Note (Signed)
Continues to do well on current meds. No changes.

## 2018-03-31 LAB — PAIN MGMT, PROFILE 8 W/CONF, U
6 Acetylmorphine: NEGATIVE ng/mL (ref <10)
ALCOHOL METABOLITES: NEGATIVE ng/mL (ref <500)
ALPHAHYDROXYALPRAZOLAM: NEGATIVE ng/mL (ref <25)
ALPHAHYDROXYMIDAZOLAM: NEGATIVE ng/mL (ref <50)
AMINOCLONAZEPAM: NEGATIVE ng/mL (ref <25)
Alphahydroxytriazolam: NEGATIVE ng/mL (ref <50)
Amphetamines: NEGATIVE ng/mL (ref <500)
BUPRENORPHINE, URINE: NEGATIVE ng/mL (ref <5)
Benzodiazepines: POSITIVE ng/mL — AB (ref <100)
COCAINE METABOLITE: NEGATIVE ng/mL (ref <150)
Creatinine: 64.1 mg/dL
HYDROXYETHYLFLURAZEPAM: NEGATIVE ng/mL (ref <50)
LORAZEPAM: NEGATIVE ng/mL (ref <50)
MARIJUANA METABOLITE: NEGATIVE ng/mL (ref <20)
MDMA: NEGATIVE ng/mL (ref <500)
Nordiazepam: 460 ng/mL — ABNORMAL HIGH (ref <50)
Opiates: NEGATIVE ng/mL (ref <100)
Oxazepam: 972 ng/mL — ABNORMAL HIGH (ref <50)
Oxidant: NEGATIVE ug/mL (ref <200)
Oxycodone: NEGATIVE ng/mL (ref <100)
TEMAZEPAM: 517 ng/mL — AB (ref <50)
pH: 6.18 (ref 4.5–9.0)

## 2018-05-14 ENCOUNTER — Telehealth: Payer: Self-pay

## 2018-05-14 ENCOUNTER — Telehealth: Payer: Self-pay | Admitting: Family Medicine

## 2018-05-14 NOTE — Telephone Encounter (Signed)
Copied from CRM [id number]. Topic: General - Other >> May 14, 2018  4:07 PM Gean BirchwoodWilliams-Neal, Sade R wrote: Pts mom is calling in stating he needs to be back on Xanax. Due to patient change in behavior

## 2018-05-14 NOTE — Telephone Encounter (Signed)
Copied from CRM [id number]. Topic: Quick Communication - See Telephone Encounter >> May 14, 2018  3:51 PM Oneal GroutSebastian, Jennifer S wrote: CRM for notification. See Telephone encounter for: 05/14/18.diazepam (VALIUM) 10 MG tablet is not working, requesting to go back on former med,  Please advise

## 2018-05-15 ENCOUNTER — Encounter: Payer: Self-pay | Admitting: Family Medicine

## 2018-05-15 ENCOUNTER — Ambulatory Visit (INDEPENDENT_AMBULATORY_CARE_PROVIDER_SITE_OTHER): Payer: Medicare Other | Admitting: Family Medicine

## 2018-05-15 DIAGNOSIS — F411 Generalized anxiety disorder: Secondary | ICD-10-CM

## 2018-05-15 DIAGNOSIS — F908 Attention-deficit hyperactivity disorder, other type: Secondary | ICD-10-CM

## 2018-05-15 DIAGNOSIS — R000 Tachycardia, unspecified: Secondary | ICD-10-CM | POA: Diagnosis not present

## 2018-05-15 MED ORDER — ALPRAZOLAM ER 3 MG PO TB24: 3.0000 mg | ORAL_TABLET | ORAL | 1 refills | Status: DC

## 2018-05-15 MED FILL — ALPRAZolam ER 3 MG TB24: 3 | 30 days supply | Qty: 30 | Fill #0

## 2018-05-15 NOTE — Telephone Encounter (Signed)
Patient was seen today by PCP and discussed medication

## 2018-05-15 NOTE — Telephone Encounter (Signed)
Does he need to come in? He has been stable a long time. He had done well on Xanax so am willing to consider putting him back on but need to know what is going on

## 2018-05-15 NOTE — Progress Notes (Signed)
Subjective:  I acted as a Neurosurgeon for Dr. Abner Greenspan. [place], [place]  Patient ID: Jesse Jones, male    DOB: Apr 21, 1981, 37 y.o.   MRN: [id number]  No chief complaint on file.   HPI  Patient is in today for a follow up for his anxiety and depression. Patient accompanied by his dad.   No recent febrile illness or acute hospitalizations. Denies CP/palp/SOB/HA/congestion/fevers/GI or GU c/o. Taking meds as prescribed .  He is here today accompanied by his father.  They are concerned because since his insurance company made him switch off of alprazolam XR to diazepam he is been having worsening anxiety, fatigue and episodes of agitation and memory loss.  He was on alprazolam XR and stable for several years prior to that time.  He also reports having taken Klonopin in the distant past and tolerating that well.  He denies any suicidal or homicidal ideation but he is more agitated than normally in a visit. Patient Care Team: Bradd Canary, MD as PCP - General (Family Medicine) Christia Reading, MD as Consulting Physician (Otolaryngology)   Past Medical History:  Diagnosis Date  . ADD (attention deficit disorder)   . ADHD (attention deficit hyperactivity disorder) 04/02/2011  . 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 Anxiety   . 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 Concussion    X 6- 7  . 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 Depression   . ED (erectile dysfunction) 05/12/2012  . Hyperlipidemia, mild 04/18/2015  . Insomnia   . 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 Outbursts of anger   . Preventative health care 10/03/2011  . Tobacco abuse 04/28/2011    Past Surgical History:  Procedure Laterality Date  . NASAL SEPTUM SURGERY Bilateral    Dr Jenne Pane 2017  . punctured lung    . toe surgeries  during childhood    for ingrown toenails    Family History  Problem Relation Age of Onset  . Depression Mother   . Anxiety disorder Mother   . Depression Brother   . Diabetes Brother        type 1   . Cancer Paternal Grandmother        lung/ didn't smoke    Social History   Socioeconomic History  . Marital status: Single    Spouse name: Not on file  . Number of children: Not on file  . Years of education: Not on file  . Highest education level: Not on file  Occupational History  . Not on file  Social Needs  . Financial resource strain: Not on file  . Food insecurity:    Worry: Not on file    Inability: Not on file  . Transportation needs:    Medical: Not on file    Non-medical: Not on file  Tobacco Use  . Smoking status: Current Every Day Smoker    Packs/day: 0.30    Years: 20.00    Pack years: 6.00    Types: Cigarettes  . Smokeless tobacco: Never Used  Substance and Sexual Activity  . Alcohol use: Yes    Alcohol/week: 21.6 oz    Types: 36 Cans of beer per week    Comment: sober x21yr. No alcohol.  . Drug use: Yes    Types: Marijuana, Other-see comments    Comment: pt stopped 4 year ago  . Sexual activity: Yes    Partners: Female    Comment: lives alone and eating well. exercising  Lifestyle  .  Physical activity:    Days per week: Not on file    Minutes per session: Not on file  . Stress: Not on file  Relationships  . Social connections:    Talks on phone: Not on file    Gets together: Not on file    Attends religious service: Not on file    Active member of club or organization: Not on file    Attends meetings of clubs or organizations: Not on file    Relationship status: Not on file  . Intimate partner violence:    Fear of current or ex partner: Not on file    Emotionally abused: Not on file    Physically abused: Not on file    Forced sexual activity: Not on file  Other Topics Concern  . Not on file  Social History Narrative  . Not on file    Outpatient Medications Prior to Visit  Medication Sig Dispense Refill  . amphetamine-dextroamphetamine (ADDERALL) 20 MG tablet Take 1 tablet (20 mg total) by mouth 2 (two) times daily. August 2019 60  tablet 0  . amphetamine-dextroamphetamine (ADDERALL) 20 MG tablet Take 1 tablet (20 mg total) by mouth 2 (two) times daily. July 2019 60 tablet 0  . amphetamine-dextroamphetamine (ADDERALL) 20 MG tablet Take 1 tablet (20 mg total) by mouth 2 (two) times daily. June 2019 60 tablet 0  . cholecalciferol (VITAMIN D) 1000 UNITS tablet Take 1,000 Units by mouth every morning.    . citalopram (CELEXA) 20 MG tablet Take 1 tablet (20 mg total) by mouth daily. 90 tablet 1  . clindamycin-benzoyl peroxide (BENZACLIN) gel Apply topically 2 (two) times daily. 25 g 2  . fish oil-omega-3 fatty acids 1000 MG capsule Take 2 g by mouth daily.    . fluticasone (FLONASE) 50 MCG/ACT nasal spray Place 2 sprays into both nostrils daily as needed for allergies or rhinitis. 16 g 6  . Multiple Vitamin (MULTIVITAMIN) tablet Take 1 tablet by mouth every morning.     . nicotine (NICODERM CQ) 7 mg/24hr patch Place 1 patch (7 mg total) onto the skin daily. 28 patch 1  . vitamin B-12 (CYANOCOBALAMIN) 1000 MCG tablet Take 1,000 mcg by mouth every morning.    . vitamin C (ASCORBIC ACID) 500 MG tablet Take 500 mg by mouth daily.    . diazepam (VALIUM) 10 MG tablet Take 1 tablet (10 mg total) by mouth every 12 (twelve) hours as needed for anxiety. 60 tablet 2   No facility-administered medications prior to visit.     Allergies  Allergen Reactions  . Haloperidol Decanoate   . Hydroxyzine Anxiety    EXCESSIVE ANXIOUNESS  . Other Other (See Comments)    Reverse effect, super hyper  . Penicillins Hives  . Valium     Review of Systems  Constitutional: Negative for fever and malaise/fatigue.  HENT: Negative for congestion.   Eyes: Negative for blurred vision.  Respiratory: Negative for shortness of breath.   Cardiovascular: Negative for chest pain, palpitations and leg swelling.  Gastrointestinal: Negative for abdominal pain, blood in stool and nausea.  Genitourinary: Negative for dysuria and frequency.  Musculoskeletal:  Negative for falls.  Skin: Negative for rash.  Neurological: Negative for dizziness, loss of consciousness and headaches.  Endo/Heme/Allergies: Negative for environmental allergies.  Psychiatric/Behavioral: Positive for depression. Negative for hallucinations, substance abuse and suicidal ideas. The patient is nervous/anxious.        Objective:    Physical Exam  Constitutional: He is oriented to  person, place, and time. He appears well-developed and well-nourished. No distress.  HENT:  Head: Normocephalic and atraumatic.  Nose: Nose normal.  Eyes: Right eye exhibits no discharge. Left eye exhibits no discharge.  Neck: Normal range of motion. Neck supple.  Cardiovascular: Normal rate and regular rhythm.  No murmur heard. Pulmonary/Chest: Effort normal and breath sounds normal.  Abdominal: Soft. Bowel sounds are normal. There is no tenderness.  Musculoskeletal: He exhibits no edema.  Neurological: He is alert and oriented to person, place, and time.  Skin: Skin is warm and dry.  Psychiatric: He has a normal mood and affect.  Nursing note and vitals reviewed.   BP 125/82 (BP Location: Left Arm, Patient Position: Sitting, Cuff Size: Normal)   Pulse 89   Temp 98.9 F (37.2 C) (Oral)   Resp 18   Wt 167 lb (75.8 kg)   SpO2 100%   BMI 21.44 kg/m  Wt Readings from Last 3 Encounters:  05/15/18 167 lb (75.8 kg)  03/27/18 170 lb (77.1 kg)  09/27/17 168 lb 12.8 oz (76.6 kg)   BP Readings from Last 3 Encounters:  05/15/18 125/82  03/27/18 100/62  09/27/17 98/70     Immunization History  Administered Date(s) Administered  . Influenza Split 07/13/2011, 09/29/2012  . Influenza,inj,Quad PF,6+ Mos 09/13/2014, 08/05/2015, 09/27/2017  . Influenza-Unspecified 08/24/2016  . Tdap 10/03/2011    Health Maintenance  Topic Date Due  . HIV Screening  08/20/1997  . INFLUENZA VACCINE  05/29/2018  . TETANUS/TDAP  10/02/2021    Lab Results  Component Value Date   WBC 8.3 03/26/2017    HGB 16.1 03/26/2017   HCT 47.3 03/26/2017   PLT 259.0 03/26/2017   GLUCOSE 88 09/27/2017   CHOL 154 03/26/2017   TRIG 107.0 03/26/2017   HDL 61.40 03/26/2017   LDLCALC 71 03/26/2017   ALT 31 09/27/2017   AST 27 09/27/2017   NA 139 09/27/2017   K 4.5 09/27/2017   CL 100 09/27/2017   CREATININE 0.92 09/27/2017   BUN 9 09/27/2017   CO2 33 (H) 09/27/2017   TSH 3.64 03/26/2017    Lab Results  Component Value Date   TSH 3.64 03/26/2017   Lab Results  Component Value Date   WBC 8.3 03/26/2017   HGB 16.1 03/26/2017   HCT 47.3 03/26/2017   MCV 87.0 03/26/2017   PLT 259.0 03/26/2017   Lab Results  Component Value Date   NA 139 09/27/2017   K 4.5 09/27/2017   CO2 33 (H) 09/27/2017   GLUCOSE 88 09/27/2017   BUN 9 09/27/2017   CREATININE 0.92 09/27/2017   BILITOT 0.4 09/27/2017   ALKPHOS 65 09/27/2017   AST 27 09/27/2017   ALT 31 09/27/2017   PROT 7.2 09/27/2017   ALBUMIN 4.7 09/27/2017   CALCIUM 10.0 09/27/2017   GFR 99.45 09/27/2017   Lab Results  Component Value Date   CHOL 154 03/26/2017   Lab Results  Component Value Date   HDL 61.40 03/26/2017   Lab Results  Component Value Date   LDLCALC 71 03/26/2017   Lab Results  Component Value Date   TRIG 107.0 03/26/2017   Lab Results  Component Value Date   CHOLHDL 3 03/26/2017   No results found for: HGBA1C       Assessment & Plan:   Problem List Items Addressed This Visit    ADHD (attention deficit hyperactivity disorder)    Stable on these meds fo quite some time. Will not change these today.  GAD (generalized anxiety disorder)    He is here today accompanied by his father.  They are concerned because since his insurance company made him switch off of alprazolam XR to diazepam he is been having worsening anxiety, fatigue and episodes of agitation and memory loss.  He was on alprazolam XR and stable for several years prior to that time.  He also reports having taken Klonopin in the distant past  and tolerating that well.  We will try switching back to alprazolam XR but if that is cost prohibitive will consider a trial of clonazepam at home.  He denies any suicidal or homicidal ideation but he is more agitated than normally in a visit. Spent 25 minutes of a 30 minute visit in counseling with patient      Relevant Medications   ALPRAZolam (ALPRAZOLAM XR) 3 MG 24 hr tablet   Tachycardia    RRR today         I have discontinued [name] "Matt"'s diazepam. I am also having him start on ALPRAZolam. Additionally, I am having him maintain his multivitamin, vitamin B-12, cholecalciferol, fish oil-omega-3 fatty acids, vitamin C, fluticasone, clindamycin-benzoyl peroxide, citalopram, amphetamine-dextroamphetamine, amphetamine-dextroamphetamine, amphetamine-dextroamphetamine, and nicotine.  Meds ordered this encounter  Medications  . ALPRAZolam (ALPRAZOLAM XR) 3 MG 24 hr tablet    Sig: Take 1 tablet (3 mg total) by mouth every morning. Failed Diazepam    Dispense:  30 tablet    Refill:  1    CMA served as scribe during this visit. History, Physical and Plan performed by medical provider. Documentation and orders reviewed and attested to.  Danise Edge, MD

## 2018-05-15 NOTE — Patient Instructions (Addendum)
GoodRx  CoverMyMeds   [facility] and [facility] all have good cost Living With Anxiety After being diagnosed with an anxiety disorder, you may be relieved to know why you have felt or behaved a certain way. It is natural to also feel overwhelmed about the treatment ahead and what it will mean for your life. With care and support, you can manage this condition and recover from it. How to cope with anxiety Dealing with stress Stress is your body's reaction to life changes and events, both good and bad. Stress can last just a few hours or it can be ongoing. Stress can play a major role in anxiety, so it is important to learn both how to cope with stress and how to think about it differently. Talk with your health care provider or a counselor to learn more about stress reduction. He or she may suggest some stress reduction techniques, such as:  Music therapy. This can include creating or listening to music that you enjoy and that inspires you.  Mindfulness-based meditation. This involves being aware of your normal breaths, rather than trying to control your breathing. It can be done while sitting or walking.  Centering prayer. This is a kind of meditation that involves focusing on a word, phrase, or sacred image that is meaningful to you and that brings you peace.  Deep breathing. To do this, expand your stomach and inhale slowly through your nose. Hold your breath for 3-5 seconds. Then exhale slowly, allowing your stomach muscles to relax.  Self-talk. This is a skill where you identify thought patterns that lead to anxiety reactions and correct those thoughts.  Muscle relaxation. This involves tensing muscles then relaxing them.  Choose a stress reduction technique that fits your lifestyle and personality. Stress reduction techniques take time and practice. Set aside 5-15 minutes a day to do them. Therapists can offer training in these techniques. The training may be covered by some  insurance plans. Other things you can do to manage stress include:  Keeping a stress diary. This can help you learn what triggers your stress and ways to control your response.  Thinking about how you respond to certain situations. You may not be able to control everything, but you can control your reaction.  Making time for activities that help you relax, and not feeling guilty about spending your time in this way.  Therapy combined with coping and stress-reduction skills provides the best chance for successful treatment. Medicines Medicines can help ease symptoms. Medicines for anxiety include:  Anti-anxiety drugs.  Antidepressants.  Beta-blockers.  Medicines may be used as the main treatment for anxiety disorder, along with therapy, or if other treatments are not working. Medicines should be prescribed by a health care provider. Relationships Relationships can play a big part in helping you recover. Try to spend more time connecting with trusted friends and family members. Consider going to couples counseling, taking family education classes, or going to family therapy. Therapy can help you and others better understand the condition. How to recognize changes in your condition Everyone has a different response to treatment for anxiety. Recovery from anxiety happens when symptoms decrease and stop interfering with your daily activities at home or work. This may mean that you will start to:  Have better concentration and focus.  Sleep better.  Be less irritable.  Have more energy.  Have improved memory.  It is important to recognize when your condition is getting worse. Contact your health care provider if your  symptoms interfere with home or work and you do not feel like your condition is improving. Where to find help and support: You can get help and support from these sources:  Self-help groups.  Online and [employer] organizations.  A trusted spiritual leader.  Couples  counseling.  Family education classes.  Family therapy.  Follow these instructions at home:  Eat a healthy diet that includes plenty of vegetables, fruits, whole grains, low-fat dairy products, and lean protein. Do not eat a lot of foods that are high in solid fats, added sugars, or salt.  Exercise. Most adults should do the following: ? Exercise for at least 150 minutes each week. The exercise should increase your heart rate and make you sweat (moderate-intensity exercise). ? Strengthening exercises at least twice a week.  Cut down on caffeine, tobacco, alcohol, and other potentially harmful substances.  Get the right amount and quality of sleep. Most adults need 7-9 hours of sleep each night.  Make choices that simplify your life.  Take over-the-counter and prescription medicines only as told by your health care provider.  Avoid caffeine, alcohol, and certain over-the-counter cold medicines. These may make you feel worse. Ask your pharmacist which medicines to avoid.  Keep all follow-up visits as told by your health care provider. This is important. Questions to ask your health care provider  Would I benefit from therapy?  How often should I follow up with a health care provider?  How long do I need to take medicine?  Are there any long-term side effects of my medicine?  Are there any alternatives to taking medicine? Contact a health care provider if:  You have a hard time staying focused or finishing daily tasks.  You spend many hours a day feeling worried about everyday life.  You become exhausted by worry.  You start to have headaches, feel tense, or have nausea.  You urinate more than normal.  You have diarrhea. Get help right away if:  You have a racing heart and shortness of breath.  You have thoughts of hurting yourself or others. If you ever feel like you may hurt yourself or others, or have thoughts about taking your own life, get help right away. You  can go to your nearest emergency department or call:  Your local emergency services (911 in the [place]  A suicide crisis helpline, such as the National Suicide Prevention Lifeline at 479-061-57161-202-297-9431. This is open 24-hours a day.  Summary  Taking steps to deal with stress can help calm you.  Medicines cannot cure anxiety disorders, but they can help ease symptoms.  Family, friends, and partners can play a big part in helping you recover from an anxiety disorder. This information is not intended to replace advice given to you by your health care provider. Make sure you discuss any questions you have with your health care provider. Document Released: 10/09/2016 Document Revised: 10/09/2016 Document Reviewed: 10/09/2016 Elsevier Interactive Patient Education  [employer]2018 [employer]

## 2018-05-15 NOTE — Telephone Encounter (Signed)
Patient here today to see PCP

## 2018-05-15 NOTE — Telephone Encounter (Signed)
Please advise 

## 2018-05-18 NOTE — Assessment & Plan Note (Addendum)
He is here today accompanied by his father.  They are concerned because since his insurance company made him switch off of alprazolam XR to diazepam he is been having worsening anxiety, fatigue and episodes of agitation and memory loss.  He was on alprazolam XR and stable for several years prior to that time.  He also reports having taken Klonopin in the distant past and tolerating that well.  We will try switching back to alprazolam XR but if that is cost prohibitive will consider a trial of clonazepam at home.  He denies any suicidal or homicidal ideation but he is more agitated than normally in a visit. Spent 25 minutes of a 30 minute visit in counseling with patient

## 2018-05-18 NOTE — Assessment & Plan Note (Signed)
RRR today 

## 2018-05-18 NOTE — Assessment & Plan Note (Signed)
Stable on these meds fo quite some time. Will not change these today.

## 2018-05-21 ENCOUNTER — Encounter: Payer: Self-pay | Admitting: Family Medicine

## 2018-06-04 NOTE — Progress Notes (Deleted)
Subjective:   Jesse Jones is a 37 y.o. male who presents for Medicare Annual/Subsequent preventive examination.  Review of Systems: No ROS.  Medicare Wellness Visit. Additional risk factors are reflected in the social history.    Sleep patterns:   Home Safety/Smoke Alarms: Feels safe in home. Smoke alarms in place.  Living environment; residence and Firearm Safety:   Male:       PSA- No results found for: PSA      Objective:    Vitals: There were no vitals taken for this visit.  There is no height or weight on file to calculate BMI.  Advanced Directives 10/02/2016  Does Patient Have a Medical Advance Directive? No  Would patient like information on creating a medical advance directive? No - Patient declined  Some encounter information is confidential and restricted. Go to Review Flowsheets activity to see all data.    Tobacco Social History   Tobacco Use  Smoking Status Current Every Day Smoker  . Packs/day: 0.30  . Years: 20.00  . Pack years: 6.00  . Types: Cigarettes  Smokeless Tobacco Never Used     Ready to quit: Not Answered Counseling given: Not Answered   Clinical Intake:                       Past Medical History:  Diagnosis Date  . ADD (attention deficit disorder)   . ADHD (attention deficit hyperactivity disorder) 04/02/2011  . 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 Anxiety   . 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 Concussion    X 6- 7  . 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 Depression   . ED (erectile dysfunction) 05/12/2012  . Hyperlipidemia, mild 04/18/2015  . Insomnia   . 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 Outbursts of anger   . Preventative health care 10/03/2011  . Tobacco abuse 04/28/2011   Past Surgical History:  Procedure Laterality Date  . NASAL SEPTUM SURGERY Bilateral    Dr Jenne Pane 2017  . punctured lung    . toe surgeries  during childhood    for ingrown toenails   Family History  Problem Relation Age of  Onset  . Depression Mother   . Anxiety disorder Mother   . Depression Brother   . Diabetes Brother        type 1  . Cancer Paternal Grandmother        lung/ didn't smoke   Social History   Socioeconomic History  . Marital status: Single    Spouse name: Not on file  . Number of children: Not on file  . Years of education: Not on file  . Highest education level: Not on file  Occupational History  . Not on file  Social Needs  . Financial resource strain: Not on file  . Food insecurity:    Worry: Not on file    Inability: Not on file  . Transportation needs:    Medical: Not on file    Non-medical: Not on file  Tobacco Use  . Smoking status: Current Every Day Smoker    Packs/day: 0.30    Years: 20.00    Pack years: 6.00    Types: Cigarettes  . Smokeless tobacco: Never Used  Substance and Sexual Activity  . Alcohol use: Yes    Alcohol/week: 21.6 oz    Types: 36 Cans of beer per week    Comment: sober x68yr. No alcohol.  . Drug use: Yes  Types: Marijuana, Other-see comments    Comment: pt stopped 4 year ago  . Sexual activity: Yes    Partners: Female    Comment: lives alone and eating well. exercising  Lifestyle  . Physical activity:    Days per week: Not on file    Minutes per session: Not on file  . Stress: Not on file  Relationships  . Social connections:    Talks on phone: Not on file    Gets together: Not on file    Attends religious service: Not on file    Active member of club or organization: Not on file    Attends meetings of clubs or organizations: Not on file    Relationship status: Not on file  Other Topics Concern  . Not on file  Social History Narrative  . Not on file    Outpatient Encounter Medications as of 06/06/2018  Medication Sig  . ALPRAZolam (ALPRAZOLAM XR) 3 MG 24 hr tablet Take 1 tablet (3 mg total) by mouth every morning. Failed Diazepam  . amphetamine-dextroamphetamine (ADDERALL) 20 MG tablet Take 1 tablet (20 mg total) by mouth 2  (two) times daily. August 2019  . amphetamine-dextroamphetamine (ADDERALL) 20 MG tablet Take 1 tablet (20 mg total) by mouth 2 (two) times daily. July 2019  . amphetamine-dextroamphetamine (ADDERALL) 20 MG tablet Take 1 tablet (20 mg total) by mouth 2 (two) times daily. June 2019  . cholecalciferol (VITAMIN D) 1000 UNITS tablet Take 1,000 Units by mouth every morning.  . citalopram (CELEXA) 20 MG tablet Take 1 tablet (20 mg total) by mouth daily.  . clindamycin-benzoyl peroxide (BENZACLIN) gel Apply topically 2 (two) times daily.  . fish oil-omega-3 fatty acids 1000 MG capsule Take 2 g by mouth daily.  . fluticasone (FLONASE) 50 MCG/ACT nasal spray Place 2 sprays into both nostrils daily as needed for allergies or rhinitis.  . Multiple Vitamin (MULTIVITAMIN) tablet Take 1 tablet by mouth every morning.   . nicotine (NICODERM CQ) 7 mg/24hr patch Place 1 patch (7 mg total) onto the skin daily.  . vitamin B-12 (CYANOCOBALAMIN) 1000 MCG tablet Take 1,000 mcg by mouth every morning.  . vitamin C (ASCORBIC ACID) 500 MG tablet Take 500 mg by mouth daily.   No facility-administered encounter medications on file as of 06/06/2018.     Activities of Daily Living No flowsheet data found.  Patient Care Team: Bradd Canary, MD as PCP - General (Family Medicine) Christia Reading, MD as Consulting Physician (Otolaryngology)   Assessment:   This is a routine wellness examination for Jesse Jones. Physical assessment deferred to PCP.  Exercise Activities and Dietary recommendations   Diet (meal preparation, eat out, water intake, caffeinated beverages, dairy products, fruits and vegetables): {Desc; diets:16563} Breakfast: Lunch:  Dinner:      Goals    None      Fall Risk Fall Risk  09/27/2017 10/02/2016  Falls in the past year? No No   Depression Screen PHQ 2/9 Scores 09/27/2017 10/02/2016  PHQ - 2 Score 0 0    Cognitive Function MMSE - Mini Mental State Exam 10/02/2016  Orientation to time 5    Orientation to Place 5  Registration 3  Attention/ Calculation 5  Recall 2  Language- name 2 objects 2  Language- repeat 1  Language- follow 3 step command 3  Language- read & follow direction 1  Write a sentence 1  Copy design 1  Total score 29        Immunization History  Administered Date(s) Administered  . Influenza Split 07/13/2011, 09/29/2012  . Influenza,inj,Quad PF,6+ Mos 09/13/2014, 08/05/2015, 09/27/2017  . Influenza-Unspecified 08/24/2016  . Tdap 10/03/2011    Screening Tests Health Maintenance  Topic Date Due  . HIV Screening  08/20/1997  . INFLUENZA VACCINE  05/29/2018  . TETANUS/TDAP  10/02/2021        Plan:   ***  I have personally reviewed and noted the following in the patient's chart:   . Medical and social history . Use of alcohol, tobacco or illicit drugs  . Current medications and supplements . Functional ability and status . Nutritional status . Physical activity . Advanced directives . List of other physicians . Hospitalizations, surgeries, and ER visits in previous 12 months . Vitals . Screenings to include cognitive, depression, and falls . Referrals and appointments  In addition, I have reviewed and discussed with patient certain preventive protocols, quality metrics, and best practice recommendations. A written personalized care plan for preventive services as well as general preventive health recommendations were provided to patient.     Avon GullyBritt, Idalys Konecny Angel, [place]  06/04/2018

## 2018-06-06 ENCOUNTER — Ambulatory Visit: Payer: Medicare Other | Admitting: *Deleted

## 2018-06-10 ENCOUNTER — Telehealth: Payer: Self-pay | Admitting: Family Medicine

## 2018-06-10 NOTE — Telephone Encounter (Signed)
Called patient to let him know that he has another refill of the adderall 20 mg at the pharmacy. But it is not due to be picked up before the 30 th. Pt states that he is out but he "will be alright". He also wants Dr. Rogelia RohrerBlythe to know that the alprazolam 3 mg 24 hr tablet is working great for him. Even his family can tell the difference.

## 2018-06-10 NOTE — Telephone Encounter (Signed)
Alprazolam Last filled per database: 05/15/18 Last written:05/15/18 Last ov: 05/15/18 Next ov 09/30/18 Contract: 03/28/19 UDS: 09/26/18  adderall Last filled per database: 05/22/18 Last written: 03/27/18 for 3rxs Last ov: 05/15/18 Next ov: 09/30/18 Contract: 03/28/19 UDS: 09/26/18

## 2018-06-10 NOTE — Telephone Encounter (Signed)
Copied from CRM [id number]. Topic: Quick Communication - Rx Refill/Question >> Jun 10, 2018  5:01 PM Avie ArenasSimmons, Taren Toops L, NT wrote: Medication: ALPRAZolam (ALPRAZOLAM XR) 3 MG 24 hr tablet and also amphetamine-dextroamphetamine (ADDERALL) 20 MG tablet  Has the patient contacted their pharmacy?  no: (Agent: If no, request that the patient contact the pharmacy for the refill. (Agent: If yes, when and what did the pharmacy advise?  Preferred Pharmacy (with phone number or street name  Agent: Please be advised that RX refills may take up to 3 business days. We ask that you follow-up with your pharmacy.

## 2018-06-10 NOTE — Telephone Encounter (Signed)
Copied from CRM [id number]. Topic: Quick Communication - Rx Refill/Question >> Jun 10, 2018  5:05 PM Stephannie LiSimmons, Ebrima Ranta L, NT wrote: Medication:     amphetamine-dextroamphetamine (ADDERALL) 20 MG tablet    Has the patient contacted their pharmacy?no (Agent: If no, request that the patient contact the pharmacy for the refill. (Agent: If yes, when and what did the pharmacy advise?)  Preferred Pharmacy (with phone number or street name [id number] - [place], [place] - [address] [phone] (Phone) [phone] (Fax)    Agent: Please be advised that RX refills may take up to 3 business days. We ask that you follow-up with your pharmacy.

## 2018-06-10 NOTE — Telephone Encounter (Signed)
2 messages taken and I combined Neoma LamingYates, Gwendolyn B, RN 11 minutes ago (5:23 PM)      Called patient to let him know that he has another refill of the adderall 20 mg at the pharmacy. But it is not due to be picked up before the 30 th. Pt states that he is out but he "will be alright". He also wants Dr. Rogelia RohrerBlythe to know that the alprazolam 3 mg 24 hr tablet is working great for him. Even his family can tell the difference.

## 2018-06-12 MED FILL — ALPRAZolam ER 3 MG TB24: 3 | 30 days supply | Qty: 30 | Fill #1

## 2018-06-19 ENCOUNTER — Ambulatory Visit (INDEPENDENT_AMBULATORY_CARE_PROVIDER_SITE_OTHER): Payer: Medicare Other | Admitting: Family Medicine

## 2018-06-19 ENCOUNTER — Encounter: Payer: Self-pay | Admitting: Family Medicine

## 2018-06-19 VITALS — BP 110/70 | HR 85 | Temp 98.1°F | Resp 18 | Wt 177.2 lb

## 2018-06-19 DIAGNOSIS — F411 Generalized anxiety disorder: Secondary | ICD-10-CM | POA: Diagnosis not present

## 2018-06-19 DIAGNOSIS — R000 Tachycardia, unspecified: Secondary | ICD-10-CM

## 2018-06-19 DIAGNOSIS — F988 Other specified behavioral and emotional disorders with onset usually occurring in childhood and adolescence: Secondary | ICD-10-CM

## 2018-06-19 DIAGNOSIS — F908 Attention-deficit hyperactivity disorder, other type: Secondary | ICD-10-CM

## 2018-06-19 MED ORDER — AMPHETAMINE-DEXTROAMPHETAMINE 30 MG PO TABS: 30.0000 mg | ORAL_TABLET | Freq: Two times a day (BID) | ORAL | 0 refills | Status: DC

## 2018-06-19 NOTE — Patient Instructions (Signed)

## 2018-06-19 NOTE — Progress Notes (Signed)
Subjective:  I acted as a Neurosurgeon for Dr. Abner Greenspan. Jesse Jones, [place]  Patient ID: Jesse Jones, male    DOB: [birth year], 37 y.o.   MRN: [id number]  No chief complaint on file.   HPI  Patient is in today for a medication follow up. Patient states he is feeling well today. He reports his anxiety improving very quickly when he was switched back to Alprazolam long acting. He denies any recent febrile illness or hospitalization. Anxiety and anhedonia resolved. Denies CP/palp/SOB/HA/congestion/fevers/GI or GU c/o. Taking meds as prescribed  Patient Care Team: Bradd Canary, MD as PCP - General (Family Medicine) Christia Reading, MD as Consulting Physician (Otolaryngology)   Past Medical History:  Diagnosis Date  . ADD (attention deficit disorder)   . ADHD (attention deficit hyperactivity disorder) 04/02/2011  . 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 Anxiety   . 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 Concussion    X 6- 7  . 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 Depression   . ED (erectile dysfunction) 05/12/2012  . Hyperlipidemia, mild 04/18/2015  . Insomnia   . 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 Outbursts of anger   . Preventative health care 10/03/2011  . Tobacco abuse 04/28/2011    Past Surgical History:  Procedure Laterality Date  . NASAL SEPTUM SURGERY Bilateral    Dr Jenne Pane 2017  . punctured lung    . toe surgeries  during childhood    for ingrown toenails    Family History  Problem Relation Age of Onset  . Depression Mother   . Anxiety disorder Mother   . Depression Brother   . Diabetes Brother        type 1  . Cancer Paternal Grandmother        lung/ didn't smoke    Social History   Socioeconomic History  . Marital status: Single    Spouse name: Not on file  . Number of children: Not on file  . Years of education: Not on file  . Highest education level: Not on file  Occupational History  . Not on file  Social Needs  . Financial resource strain: Not on file   . Food insecurity:    Worry: Not on file    Inability: Not on file  . Transportation needs:    Medical: Not on file    Non-medical: Not on file  Tobacco Use  . Smoking status: Current Every Day Smoker    Packs/day: 0.30    Years: 20.00    Pack years: 6.00    Types: Cigarettes  . Smokeless tobacco: Never Used  Substance and Sexual Activity  . Alcohol use: Yes    Alcohol/week: 36.0 standard drinks    Types: 36 Cans of beer per week    Comment: sober x72yr. No alcohol.  . Drug use: Yes    Types: Marijuana, Other-see comments    Comment: pt stopped 4 year ago  . Sexual activity: Yes    Partners: Female    Comment: lives alone and eating well. exercising  Lifestyle  . Physical activity:    Days per week: Not on file    Minutes per session: Not on file  . Stress: Not on file  Relationships  . Social connections:    Talks on phone: Not on file    Gets together: Not on file    Attends religious service: Not on file    Active member of club or organization: Not on file  Attends meetings of clubs or organizations: Not on file    Relationship status: Not on file  . Intimate partner violence:    Fear of current or ex partner: Not on file    Emotionally abused: Not on file    Physically abused: Not on file    Forced sexual activity: Not on file  Other Topics Concern  . Not on file  Social History Narrative  . Not on file    Outpatient Medications Prior to Visit  Medication Sig Dispense Refill  . ALPRAZolam (ALPRAZOLAM XR) 3 MG 24 hr tablet Take 1 tablet (3 mg total) by mouth every morning. Failed Diazepam 30 tablet 1  . amphetamine-dextroamphetamine (ADDERALL) 20 MG tablet Take 1 tablet (20 mg total) by mouth 2 (two) times daily. August 2019 60 tablet 0  . amphetamine-dextroamphetamine (ADDERALL) 20 MG tablet Take 1 tablet (20 mg total) by mouth 2 (two) times daily. July 2019 60 tablet 0  . amphetamine-dextroamphetamine (ADDERALL) 20 MG tablet Take 1 tablet (20 mg total) by  mouth 2 (two) times daily. June 2019 60 tablet 0  . cholecalciferol (VITAMIN D) 1000 UNITS tablet Take 1,000 Units by mouth every morning.    . citalopram (CELEXA) 20 MG tablet Take 1 tablet (20 mg total) by mouth daily. 90 tablet 1  . clindamycin-benzoyl peroxide (BENZACLIN) gel Apply topically 2 (two) times daily. 25 g 2  . fish oil-omega-3 fatty acids 1000 MG capsule Take 2 g by mouth daily.    . fluticasone (FLONASE) 50 MCG/ACT nasal spray Place 2 sprays into both nostrils daily as needed for allergies or rhinitis. 16 g 6  . Multiple Vitamin (MULTIVITAMIN) tablet Take 1 tablet by mouth every morning.     . nicotine (NICODERM CQ) 7 mg/24hr patch Place 1 patch (7 mg total) onto the skin daily. 28 patch 1  . vitamin B-12 (CYANOCOBALAMIN) 1000 MCG tablet Take 1,000 mcg by mouth every morning.    . vitamin C (ASCORBIC ACID) 500 MG tablet Take 500 mg by mouth daily.     No facility-administered medications prior to visit.     Allergies  Allergen Reactions  . Haloperidol Decanoate   . Hydroxyzine Anxiety    EXCESSIVE ANXIOUNESS  . Other Other (See Comments)    Reverse effect, super hyper  . Penicillins Hives  . Valium     Review of Systems  Constitutional: Negative for fever and malaise/fatigue.  HENT: Negative for congestion.   Eyes: Negative for blurred vision.  Respiratory: Negative for shortness of breath.   Cardiovascular: Negative for chest pain, palpitations and leg swelling.  Gastrointestinal: Negative for abdominal pain, blood in stool and nausea.  Genitourinary: Negative for dysuria and frequency.  Musculoskeletal: Negative for falls.  Skin: Negative for rash.  Neurological: Negative for dizziness, loss of consciousness and headaches.  Endo/Heme/Allergies: Negative for environmental allergies.  Psychiatric/Behavioral: Negative for depression. The patient is not nervous/anxious.        Objective:    Physical Exam  Constitutional: He is oriented to person, place, and  time. He appears well-developed and well-nourished. No distress.  HENT:  Head: Normocephalic and atraumatic.  Nose: Nose normal.  Eyes: Right eye exhibits no discharge. Left eye exhibits no discharge.  Neck: Normal range of motion. Neck supple.  Cardiovascular: Normal rate and regular rhythm.  No murmur heard. Pulmonary/Chest: Effort normal and breath sounds normal.  Abdominal: Soft. Bowel sounds are normal. There is no tenderness.  Musculoskeletal: He exhibits no edema.  Neurological: He is  alert and oriented to person, place, and time.  Skin: Skin is warm and dry.  Psychiatric: He has a normal mood and affect.  Nursing note and vitals reviewed.   BP 110/70 (BP Location: Left Arm, Patient Position: Sitting, Cuff Size: Normal)   Pulse 85   Temp 98.1 F (36.7 C) (Oral)   Resp 18   Wt 177 lb 3.2 oz (80.4 kg)   SpO2 98%   BMI 22.75 kg/m  Wt Readings from Last 3 Encounters:  06/19/18 177 lb 3.2 oz (80.4 kg)  05/15/18 167 lb (75.8 kg)  03/27/18 170 lb (77.1 kg)   BP Readings from Last 3 Encounters:  06/19/18 110/70  05/15/18 125/82  03/27/18 100/62     Immunization History  Administered Date(s) Administered  . Influenza Split 07/13/2011, 09/29/2012  . Influenza,inj,Quad PF,6+ Mos 09/13/2014, 08/05/2015, 09/27/2017  . Influenza-Unspecified 08/24/2016  . Tdap 10/03/2011    Health Maintenance  Topic Date Due  . HIV Screening  08/20/1997  . INFLUENZA VACCINE  05/29/2018  . TETANUS/TDAP  10/02/2021    Lab Results  Component Value Date   WBC 8.3 03/26/2017   HGB 16.1 03/26/2017   HCT 47.3 03/26/2017   PLT 259.0 03/26/2017   GLUCOSE 88 09/27/2017   CHOL 154 03/26/2017   TRIG 107.0 03/26/2017   HDL 61.40 03/26/2017   LDLCALC 71 03/26/2017   ALT 31 09/27/2017   AST 27 09/27/2017   NA 139 09/27/2017   K 4.5 09/27/2017   CL 100 09/27/2017   CREATININE 0.92 09/27/2017   BUN 9 09/27/2017   CO2 33 (H) 09/27/2017   TSH 3.64 03/26/2017    Lab Results  Component  Value Date   TSH 3.64 03/26/2017   Lab Results  Component Value Date   WBC 8.3 03/26/2017   HGB 16.1 03/26/2017   HCT 47.3 03/26/2017   MCV 87.0 03/26/2017   PLT 259.0 03/26/2017   Lab Results  Component Value Date   NA 139 09/27/2017   K 4.5 09/27/2017   CO2 33 (H) 09/27/2017   GLUCOSE 88 09/27/2017   BUN 9 09/27/2017   CREATININE 0.92 09/27/2017   BILITOT 0.4 09/27/2017   ALKPHOS 65 09/27/2017   AST 27 09/27/2017   ALT 31 09/27/2017   PROT 7.2 09/27/2017   ALBUMIN 4.7 09/27/2017   CALCIUM 10.0 09/27/2017   GFR 99.45 09/27/2017   Lab Results  Component Value Date   CHOL 154 03/26/2017   Lab Results  Component Value Date   HDL 61.40 03/26/2017   Lab Results  Component Value Date   LDLCALC 71 03/26/2017   Lab Results  Component Value Date   TRIG 107.0 03/26/2017   Lab Results  Component Value Date   CHOLHDL 3 03/26/2017   No results found for: HGBA1C       Assessment & Plan:   Problem List Items Addressed This Visit    ADHD (attention deficit hyperactivity disorder)    Stable on current meds. No changes.       GAD (generalized anxiety disorder) - Primary    Feels much better back on Alprazolam XR 3 mg daily, continue the same.       Tachycardia    RRR today       Other Visit Diagnoses    Attention deficit disorder, unspecified hyperactivity presence       Relevant Medications   amphetamine-dextroamphetamine (ADDERALL) 30 MG tablet      I am having Jesse KielMatthew A. Gad "Matt" start on  amphetamine-dextroamphetamine. I am also having him maintain his multivitamin, vitamin B-12, cholecalciferol, fish oil-omega-3 fatty acids, vitamin C, fluticasone, clindamycin-benzoyl peroxide, citalopram, amphetamine-dextroamphetamine, amphetamine-dextroamphetamine, amphetamine-dextroamphetamine, nicotine, and ALPRAZolam.  Meds ordered this encounter  Medications  . amphetamine-dextroamphetamine (ADDERALL) 30 MG tablet    Sig: Take 1 tablet by mouth 2 (two)  times daily.    Dispense:  60 tablet    Refill:  0    CMA served as scribe during this visit. History, Physical and Plan performed by medical provider. Documentation and orders reviewed and attested to.  Danise Edge, MD

## 2018-06-22 NOTE — Assessment & Plan Note (Signed)
Feels much better back on Alprazolam XR 3 mg daily, continue the same.

## 2018-06-22 NOTE — Assessment & Plan Note (Signed)
Stable on current meds. No changes 

## 2018-06-22 NOTE — Assessment & Plan Note (Signed)
RRR today 

## 2018-07-11 ENCOUNTER — Other Ambulatory Visit: Payer: Self-pay | Admitting: Family Medicine

## 2018-07-12 ENCOUNTER — Encounter: Payer: Self-pay | Admitting: Family Medicine

## 2018-07-14 MED FILL — ALPRAZolam ER 3 MG TB24: 3 | 30 days supply | Qty: 30 | Fill #0

## 2018-07-14 NOTE — Telephone Encounter (Signed)
Requesting:xanax Contract:yes UDS:low risk next screen 09/26/18 Last OV:06/19/18 Next OV:07/21/18 Last Refill:05/15/18  #[id number] Database:   Please advise

## 2018-07-14 NOTE — Telephone Encounter (Signed)
Pts dad is calling in wanting an update on pts xanax I educated him that  It takes 3 business days to be filled and we ask that med refills are called in when patients are down to 10 pills to avoid being completely out of meds.

## 2018-07-17 ENCOUNTER — Other Ambulatory Visit: Payer: Self-pay | Admitting: Family Medicine

## 2018-07-17 DIAGNOSIS — F988 Other specified behavioral and emotional disorders with onset usually occurring in childhood and adolescence: Secondary | ICD-10-CM

## 2018-07-17 MED ORDER — AMPHETAMINE-DEXTROAMPHETAMINE 30 MG PO TABS: 30.0000 mg | ORAL_TABLET | Freq: Two times a day (BID) | ORAL | 0 refills | Status: DC

## 2018-07-17 NOTE — Telephone Encounter (Signed)
Pt is requesting refill on Adderall.   Last OV: 06/19/2018 Last Fill: 06/19/2018 #60 and 0RF UDS: 03/27/2018 Low risk

## 2018-07-21 ENCOUNTER — Ambulatory Visit: Payer: Medicare Other | Admitting: Family Medicine

## 2018-07-21 DIAGNOSIS — Z0289 Encounter for other administrative examinations: Secondary | ICD-10-CM

## 2018-07-29 ENCOUNTER — Ambulatory Visit (INDEPENDENT_AMBULATORY_CARE_PROVIDER_SITE_OTHER): Payer: Medicare Other | Admitting: Family Medicine

## 2018-07-29 DIAGNOSIS — F908 Attention-deficit hyperactivity disorder, other type: Secondary | ICD-10-CM

## 2018-07-29 DIAGNOSIS — Z720 Tobacco use: Secondary | ICD-10-CM | POA: Diagnosis not present

## 2018-07-29 DIAGNOSIS — F411 Generalized anxiety disorder: Secondary | ICD-10-CM

## 2018-07-29 DIAGNOSIS — Z23 Encounter for immunization: Secondary | ICD-10-CM

## 2018-07-29 NOTE — Assessment & Plan Note (Addendum)
Is better on the Alprazolam XR 3 mg and Citalopram 20 mg daily but still having some trouble in the morning. May consider increasing the Citalopram to 30 mg daily. counseled for 25 minutes today.

## 2018-07-29 NOTE — Patient Instructions (Addendum)
Jesse Jones, Chamomile tea, Warm milk with vanilla and honey and some carb helps with sleep  Melatonin 2-5 mg at bedtime.   Consider either increase the Citalopram to 40 mg daily or we can switch to Sertraline/Zoloft Living With Anxiety After being diagnosed with an anxiety disorder, you may be relieved to know why you have felt or behaved a certain way. It is natural to also feel overwhelmed about the treatment ahead and what it will mean for your life. With care and support, you can manage this condition and recover from it. How to cope with anxiety Dealing with stress Stress is your body's reaction to life changes and events, both good and bad. Stress can last just a few hours or it can be ongoing. Stress can play a major role in anxiety, so it is important to learn both how to cope with stress and how to think about it differently. Talk with your health care provider or a counselor to learn more about stress reduction. He or she may suggest some stress reduction techniques, such as:  Music therapy. This can include creating or listening to music that you enjoy and that inspires you.  Mindfulness-based meditation. This involves being aware of your normal breaths, rather than trying to control your breathing. It can be done while sitting or walking.  Centering prayer. This is a kind of meditation that involves focusing on a word, phrase, or sacred image that is meaningful to you and that brings you peace.  Deep breathing. To do this, expand your stomach and inhale slowly through your nose. Hold your breath for 3-5 seconds. Then exhale slowly, allowing your stomach muscles to relax.  Self-talk. This is a skill where you identify thought patterns that lead to anxiety reactions and correct those thoughts.  Muscle relaxation. This involves tensing muscles then relaxing them.  Choose a stress reduction technique that fits your lifestyle and personality. Stress reduction techniques take Jones and  practice. Set aside 5-15 minutes a day to do them. Therapists can offer training in these techniques. The training may be covered by some insurance plans. Other things you can do to manage stress include:  Keeping a stress diary. This can help you learn what triggers your stress and ways to control your response.  Thinking about how you respond to certain situations. You may not be able to control everything, but you can control your reaction.  Making Jones for activities that help you relax, and not feeling guilty about spending your Jones in this way.  Therapy combined with coping and stress-reduction skills provides the best chance for successful treatment. Medicines Medicines can help ease symptoms. Medicines for anxiety include:  Anti-anxiety drugs.  Antidepressants.  Beta-blockers.  Medicines may be used as the main treatment for anxiety disorder, along with therapy, or if other treatments are not working. Medicines should be prescribed by a health care provider. Relationships Relationships can play a big part in helping you recover. Try to spend more Jones connecting with trusted friends and family members. Consider going to couples counseling, taking family education classes, or going to family therapy. Therapy can help you and others better understand the condition. How to recognize changes in your condition Everyone has a different response to treatment for anxiety. Recovery from anxiety happens when symptoms decrease and stop interfering with your daily activities at home or work. This may mean that you will start to:  Have better concentration and focus.  Sleep better.  Be less irritable.  Have  more energy.  Have improved memory.  It is important to recognize when your condition is getting worse. Contact your health care provider if your symptoms interfere with home or work and you do not feel like your condition is improving. Where to find help and support: You can get  help and support from these sources:  Self-help groups.  Online and [employer].  A trusted spiritual leader.  Couples counseling.  Family education classes.  Family therapy.  Follow these instructions at home:  Eat a healthy diet that includes plenty of vegetables, fruits, whole grains, low-fat dairy products, and lean protein. Do not eat a lot of foods that are high in solid fats, added sugars, or salt.  Exercise. Most adults should do the following: ? Exercise for at least 150 minutes each week. The exercise should increase your heart rate and make you sweat (moderate-intensity exercise). ? Strengthening exercises at least twice a week.  Cut down on caffeine, tobacco, alcohol, and other potentially harmful substances.  Get the right amount and quality of sleep. Most adults need 7-9 hours of sleep each night.  Make choices that simplify your life.  Take over-the-counter and prescription medicines only as told by your health care provider.  Avoid caffeine, alcohol, and certain over-the-counter cold medicines. These may make you feel worse. Ask your pharmacist which medicines to avoid.  Keep all follow-up visits as told by your health care provider. This is important. Questions to ask your health care provider  Would I benefit from therapy?  How often should I follow up with a health care provider?  How long do I need to take medicine?  Are there any long-term side effects of my medicine?  Are there any alternatives to taking medicine? Contact a health care provider if:  You have a hard Jones staying focused or finishing daily tasks.  You spend many hours a day feeling worried about everyday life.  You become exhausted by worry.  You start to have headaches, feel tense, or have nausea.  You urinate more than normal.  You have diarrhea. Get help right away if:  You have a racing heart and shortness of breath.  You have thoughts of hurting  yourself or others. If you ever feel like you may hurt yourself or others, or have thoughts about taking your own life, get help right away. You can go to your nearest emergency department or call:  Your local emergency services (911 in the [place]  A suicide crisis helpline, such as the National Suicide Prevention Lifeline at 7175568649. This is open 24-hours a day.  Summary  Taking steps to deal with stress can help calm you.  Medicines cannot cure anxiety disorders, but they can help ease symptoms.  Family, friends, and partners can play a big part in helping you recover from an anxiety disorder. This information is not intended to replace advice given to you by your health care provider. Make sure you discuss any questions you have with your health care provider. Document Released: 10/09/2016 Document Revised: 10/09/2016 Document Reviewed: 10/09/2016 Elsevier Interactive Patient Education  [employer].

## 2018-08-03 NOTE — Assessment & Plan Note (Signed)
Encouraged complete cessation. Discussed need to quit as relates to risk of numerous cancers, cardiac and pulmonary disease as well as neurologic complications. Counseled for greater than 3 minutes. Smoking about 1/2 to 1 ppd. Discussed trying patches but declines at this time.

## 2018-08-03 NOTE — Assessment & Plan Note (Signed)
Doing well with completing tasks on current dose of medications

## 2018-08-03 NOTE — Progress Notes (Signed)
Subjective:    Patient ID: Jesse Jones, male    DOB: [birth year], 37 y.o.   MRN: [id number]  No chief complaint on file.   HPI Patient is in today for follow up. No recent febrile illness or acute concerns. No recent hospitalizations. He is still struggling with some level of anxiety but it is improved since switching back to Alprazolam but he still is not fully back to his baseline. He admists he is not sleeping well and gets shaky at times. Notes anhedonia but no suicidal ideation. Denies CP/palp/SOB/HA/congestion/fevers/GI or GU c/o. Taking meds as prescribed  Past Medical History:  Diagnosis Date  . ADD (attention deficit disorder)   . ADHD (attention deficit hyperactivity disorder) 04/02/2011  . 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 Anxiety   . 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 Concussion    X 6- 7  . 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 Depression   . ED (erectile dysfunction) 05/12/2012  . Hyperlipidemia, mild 04/18/2015  . Insomnia   . 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 Outbursts of anger   . Preventative health care 10/03/2011  . Tobacco abuse 04/28/2011    Past Surgical History:  Procedure Laterality Date  . NASAL SEPTUM SURGERY Bilateral    Dr Jenne Pane 2017  . punctured lung    . toe surgeries  during childhood    for ingrown toenails    Family History  Problem Relation Age of Onset  . Depression Mother   . Anxiety disorder Mother   . Depression Brother   . Diabetes Brother        type 1  . Cancer Paternal Grandmother        lung/ didn't smoke    Social History   Socioeconomic History  . Marital status: Single    Spouse name: Not on file  . Number of children: Not on file  . Years of education: Not on file  . Highest education level: Not on file  Occupational History  . Not on file  Social Needs  . Financial resource strain: Not on file  . Food insecurity:    Worry: Not on file    Inability: Not on file  . Transportation needs:   Medical: Not on file    Non-medical: Not on file  Tobacco Use  . Smoking status: Current Every Day Smoker    Packs/day: 0.30    Years: 20.00    Pack years: 6.00    Types: Cigarettes  . Smokeless tobacco: Never Used  Substance and Sexual Activity  . Alcohol use: Yes    Alcohol/week: 36.0 standard drinks    Types: 36 Cans of beer per week    Comment: sober x53yr. No alcohol.  . Drug use: Yes    Types: Marijuana, Other-see comments    Comment: pt stopped 4 year ago  . Sexual activity: Yes    Partners: Female    Comment: lives alone and eating well. exercising  Lifestyle  . Physical activity:    Days per week: Not on file    Minutes per session: Not on file  . Stress: Not on file  Relationships  . Social connections:    Talks on phone: Not on file    Gets together: Not on file    Attends religious service: Not on file    Active member of club or organization: Not on file    Attends meetings of clubs or organizations: Not on file    Relationship  status: Not on file  . Intimate partner violence:    Fear of current or ex partner: Not on file    Emotionally abused: Not on file    Physically abused: Not on file    Forced sexual activity: Not on file  Other Topics Concern  . Not on file  Social History Narrative  . Not on file    Outpatient Medications Prior to Visit  Medication Sig Dispense Refill  . ALPRAZolam (XANAX XR) 3 MG 24 hr tablet TAKE 1 TABLET BY MOUTH EVERY MORNING 30 tablet 1  . amphetamine-dextroamphetamine (ADDERALL) 30 MG tablet Take 1 tablet by mouth 2 (two) times daily. 60 tablet 0  . cholecalciferol (VITAMIN D) 1000 UNITS tablet Take 1,000 Units by mouth every morning.    . citalopram (CELEXA) 20 MG tablet Take 1 tablet (20 mg total) by mouth daily. 90 tablet 1  . clindamycin-benzoyl peroxide (BENZACLIN) gel Apply topically 2 (two) times daily. 25 g 2  . fish oil-omega-3 fatty acids 1000 MG capsule Take 2 g by mouth daily.    . fluticasone (FLONASE) 50  MCG/ACT nasal spray Place 2 sprays into both nostrils daily as needed for allergies or rhinitis. 16 g 6  . Multiple Vitamin (MULTIVITAMIN) tablet Take 1 tablet by mouth every morning.     . nicotine (NICODERM CQ) 7 mg/24hr patch Place 1 patch (7 mg total) onto the skin daily. 28 patch 1  . vitamin B-12 (CYANOCOBALAMIN) 1000 MCG tablet Take 1,000 mcg by mouth every morning.    . vitamin C (ASCORBIC ACID) 500 MG tablet Take 500 mg by mouth daily.     No facility-administered medications prior to visit.     Allergies  Allergen Reactions  . Haloperidol Decanoate   . Hydroxyzine Anxiety    EXCESSIVE ANXIOUNESS  . Other Other (See Comments)    Reverse effect, super hyper  . Penicillins Hives  . Valium     Review of Systems  Constitutional: Negative for fever and malaise/fatigue.  HENT: Negative for congestion.   Eyes: Negative for blurred vision.  Respiratory: Negative for shortness of breath.   Cardiovascular: Negative for chest pain, palpitations and leg swelling.  Gastrointestinal: Negative for abdominal pain, blood in stool and nausea.  Genitourinary: Negative for dysuria and frequency.  Musculoskeletal: Negative for falls.  Skin: Negative for rash.  Neurological: Negative for dizziness, loss of consciousness and headaches.  Endo/Heme/Allergies: Negative for environmental allergies.  Psychiatric/Behavioral: Positive for depression. Negative for hallucinations, substance abuse and suicidal ideas. The patient is nervous/anxious and has insomnia.        Objective:    Physical Exam  Constitutional: He is oriented to person, place, and time. He appears well-developed and well-nourished. No distress.  HENT:  Head: Normocephalic and atraumatic.  Nose: Nose normal.  Eyes: Right eye exhibits no discharge. Left eye exhibits no discharge.  Neck: Normal range of motion. Neck supple.  Cardiovascular: Normal rate and regular rhythm.  No murmur heard. Pulmonary/Chest: Effort normal and  breath sounds normal.  Abdominal: Soft. Bowel sounds are normal. There is no tenderness.  Musculoskeletal: He exhibits no edema.  Neurological: He is alert and oriented to person, place, and time.  Skin: Skin is warm and dry.  Psychiatric: He has a normal mood and affect.  Nursing note and vitals reviewed.   BP 118/82 (BP Location: Left Arm, Patient Position: Sitting, Cuff Size: Normal)   Pulse 82   Temp 97.6 F (36.4 C) (Oral)   Resp 18  Wt 164 lb (74.4 kg)   SpO2 98%   BMI 21.06 kg/m  Wt Readings from Last 3 Encounters:  07/29/18 164 lb (74.4 kg)  06/19/18 177 lb 3.2 oz (80.4 kg)  05/15/18 167 lb (75.8 kg)     Lab Results  Component Value Date   WBC 8.3 03/26/2017   HGB 16.1 03/26/2017   HCT 47.3 03/26/2017   PLT 259.0 03/26/2017   GLUCOSE 88 09/27/2017   CHOL 154 03/26/2017   TRIG 107.0 03/26/2017   HDL 61.40 03/26/2017   LDLCALC 71 03/26/2017   ALT 31 09/27/2017   AST 27 09/27/2017   NA 139 09/27/2017   K 4.5 09/27/2017   CL 100 09/27/2017   CREATININE 0.92 09/27/2017   BUN 9 09/27/2017   CO2 33 (H) 09/27/2017   TSH 3.64 03/26/2017    Lab Results  Component Value Date   TSH 3.64 03/26/2017   Lab Results  Component Value Date   WBC 8.3 03/26/2017   HGB 16.1 03/26/2017   HCT 47.3 03/26/2017   MCV 87.0 03/26/2017   PLT 259.0 03/26/2017   Lab Results  Component Value Date   NA 139 09/27/2017   K 4.5 09/27/2017   CO2 33 (H) 09/27/2017   GLUCOSE 88 09/27/2017   BUN 9 09/27/2017   CREATININE 0.92 09/27/2017   BILITOT 0.4 09/27/2017   ALKPHOS 65 09/27/2017   AST 27 09/27/2017   ALT 31 09/27/2017   PROT 7.2 09/27/2017   ALBUMIN 4.7 09/27/2017   CALCIUM 10.0 09/27/2017   GFR 99.45 09/27/2017   Lab Results  Component Value Date   CHOL 154 03/26/2017   Lab Results  Component Value Date   HDL 61.40 03/26/2017   Lab Results  Component Value Date   LDLCALC 71 03/26/2017   Lab Results  Component Value Date   TRIG 107.0 03/26/2017   Lab  Results  Component Value Date   CHOLHDL 3 03/26/2017   No results found for: HGBA1C     Assessment & Plan:   Problem List Items Addressed This Visit    ADHD (attention deficit hyperactivity disorder)    Doing well with completing tasks on current dose of medications      GAD (generalized anxiety disorder)    Is better on the Alprazolam XR 3 mg and Citalopram 20 mg daily but still having some trouble in the morning. May consider increasing the Citalopram to 30 mg daily. counseled for 25 minutes today.      Tobacco abuse    Encouraged complete cessation. Discussed need to quit as relates to risk of numerous cancers, cardiac and pulmonary disease as well as neurologic complications. Counseled for greater than 3 minutes. Smoking about 1/2 to 1 ppd. Discussed trying patches but declines at this time.          I am having Jesse Jones "Matt" maintain his multivitamin, vitamin B-12, cholecalciferol, fish oil-omega-3 fatty acids, vitamin C, fluticasone, clindamycin-benzoyl peroxide, citalopram, nicotine, ALPRAZolam, and amphetamine-dextroamphetamine.  No orders of the defined types were placed in this encounter.    Danise Edge, MD

## 2018-08-05 ENCOUNTER — Other Ambulatory Visit: Payer: Self-pay | Admitting: Family Medicine

## 2018-08-11 ENCOUNTER — Telehealth: Payer: Self-pay | Admitting: Family Medicine

## 2018-08-11 ENCOUNTER — Other Ambulatory Visit: Payer: Self-pay | Admitting: Family Medicine

## 2018-08-11 DIAGNOSIS — F988 Other specified behavioral and emotional disorders with onset usually occurring in childhood and adolescence: Secondary | ICD-10-CM

## 2018-08-11 MED FILL — ALPRAZolam ER 3 MG TB24: 3 | 30 days supply | Qty: 30 | Fill #1

## 2018-08-11 NOTE — Telephone Encounter (Signed)
Copied from CRM [id number]. Topic: Quick Communication - See Telephone Encounter >> Aug 11, 2018  4:35 PM Angela Nevin wrote: CRM for notification. See Telephone encounter for: 08/11/18.  Pt wanted to let Dr. Abner Greenspan know that her suggestion of dosage for CELEXA has been working great for him. Pt states that since everything is going so well with what Dr. Abner Greenspan has been suggesting, he is now open to trying the Ritalin rather than adderall if that is still recommended since the last time it was discussed.

## 2018-08-11 NOTE — Telephone Encounter (Signed)
Copied from CRM [id number]. Topic: Quick Communication - Rx Refill/Question >> Aug 11, 2018  4:34 PM Angela Nevin wrote: Medication: amphetamine-dextroamphetamine (ADDERALL) 30 MG tablet  Pt is requesting a refill of this medication sent to pharmacy.  Preferred Pharmacy (with phone number or street name):[id number] - [place], [place] - [address] AT [phone] (Phone) [phone] (Fax)

## 2018-08-12 ENCOUNTER — Other Ambulatory Visit: Payer: Self-pay | Admitting: Family Medicine

## 2018-08-12 DIAGNOSIS — F988 Other specified behavioral and emotional disorders with onset usually occurring in childhood and adolescence: Secondary | ICD-10-CM

## 2018-08-12 MED ORDER — AMPHETAMINE-DEXTROAMPHETAMINE 30 MG PO TABS: 30.0000 mg | ORAL_TABLET | Freq: Two times a day (BID) | ORAL | 0 refills | Status: DC

## 2018-08-12 NOTE — Telephone Encounter (Signed)
Please change MAR to reflect Celexa dose and send in new rx with 30 day supply with 3 rf. He just got an rx for Adderall so at the end of the month d/c the Adderall and start Ritalin 20 mg tabs 1 tab po tid. Disp #90. Then make him an appt for 2 weeks after he starts the Ritalin

## 2018-08-12 NOTE — Telephone Encounter (Signed)
Confirm what dose of Celexa he is currently taking so we can document and then if he wants to switch we can when he runs out of current dose of Adderall but we should have him in in next month to confirm response.

## 2018-08-12 NOTE — Telephone Encounter (Signed)
Pt is requesting refill on Adderall.   Last OV: 07/29/2018 Last Fill: 07/17/2018 #60 and 0RF UDS: 03/27/2018 Low risk

## 2018-08-12 NOTE — Telephone Encounter (Signed)
Requested medication (s) are due for refill today:   Yes  Requested medication (s) are on the active medication list:   Yes  Future visit scheduled:   09/30/18 with Abner Greenspan   Last ordered: 07/17/18  #60  0 refills   Requested Prescriptions  Pending Prescriptions Disp Refills   amphetamine-dextroamphetamine (ADDERALL) 30 MG tablet 60 tablet 0    Sig: Take 1 tablet by mouth 2 (two) times daily.     Not Delegated - Psychiatry:  Stimulants/ADHD Failed - 08/11/2018  6:32 PM      Failed - This refill cannot be delegated      Failed - Urine Drug Screen completed in last 360 days.      Passed - Valid encounter within last 3 months    Recent Outpatient Visits          2 weeks ago GAD (generalized anxiety disorder)   [facility], Bryon Lions, MD   1 month ago GAD (generalized anxiety disorder)   [facility], Bryon Lions, MD   2 months ago Attention deficit hyperactivity disorder (ADHD), other type   [facility], Bryon Lions, MD   4 months ago High risk medication use   [facility], Bryon Lions, MD   10 months ago Needs flu shot   [facility], Bryon Lions, MD      Future Appointments            In 1 month Abner Greenspan, Bryon Lions, MD [facility] at [facility], [place]

## 2018-08-12 NOTE — Telephone Encounter (Signed)
Please advise 

## 2018-08-12 NOTE — Telephone Encounter (Signed)
He currently takes 1.5 of celexa

## 2018-08-13 ENCOUNTER — Encounter: Payer: Self-pay | Admitting: Family Medicine

## 2018-08-25 NOTE — Telephone Encounter (Signed)
Adderall has been d/c. Spoke with patient he has been made aware of the change.  Please approve rx for the Ritalin, I have uploaded.   Also patient states he has been taking the Celexa 1.5 tab daily. He states he feels he needs to take 2 tablets daily one in the morning and one at night. He says his mom thinks the 2 daily works best for him.   Please advise if he can take 2 tabs daily    He also has been made aware to call and letting us know if the Ritalin works for him or not and we can change back to the Adderall if he calls and let us know. We also made an appointment with PCP for 09/15/18 to discuss medication changes. He has been made aware if the medication works or not we will still need to see him in 2 weeks due to the medication changes.  He agreed.

## 2018-08-25 NOTE — Telephone Encounter (Signed)
Great I agree with changes but did not see refill in my rx request basket

## 2018-08-26 ENCOUNTER — Other Ambulatory Visit: Payer: Self-pay

## 2018-08-26 MED ORDER — METHYLPHENIDATE HCL 20 MG PO TABS: 20.0000 mg | ORAL_TABLET | Freq: Three times a day (TID) | ORAL | 0 refills | Status: DC

## 2018-08-26 NOTE — Telephone Encounter (Signed)
Per the conversation on 08/11/18  Please send in Ritalin for patient

## 2018-08-26 NOTE — Telephone Encounter (Signed)
I have resent medication request through medication refills

## 2018-09-08 ENCOUNTER — Other Ambulatory Visit: Payer: Self-pay | Admitting: Family Medicine

## 2018-09-11 MED FILL — ALPRAZolam ER 3 MG TB24: 3 | 30 days supply | Qty: 30 | Fill #0

## 2018-09-11 NOTE — Telephone Encounter (Signed)
Pt is out of medication

## 2018-09-15 ENCOUNTER — Encounter: Payer: Self-pay | Admitting: Family Medicine

## 2018-09-15 ENCOUNTER — Ambulatory Visit (INDEPENDENT_AMBULATORY_CARE_PROVIDER_SITE_OTHER): Payer: Medicare Other | Admitting: Family Medicine

## 2018-09-15 VITALS — BP 118/87 | HR 85 | Temp 98.4°F | Resp 18 | Wt 170.0 lb

## 2018-09-15 DIAGNOSIS — F908 Attention-deficit hyperactivity disorder, other type: Secondary | ICD-10-CM

## 2018-09-15 DIAGNOSIS — F411 Generalized anxiety disorder: Secondary | ICD-10-CM

## 2018-09-15 DIAGNOSIS — R000 Tachycardia, unspecified: Secondary | ICD-10-CM

## 2018-09-15 DIAGNOSIS — Z79899 Other long term (current) drug therapy: Secondary | ICD-10-CM | POA: Diagnosis not present

## 2018-09-15 DIAGNOSIS — Z720 Tobacco use: Secondary | ICD-10-CM

## 2018-09-15 MED ORDER — CITALOPRAM HYDROBROMIDE 20 MG PO TABS: 20.0000 mg | ORAL_TABLET | Freq: Two times a day (BID) | ORAL | 1 refills | Status: DC

## 2018-09-15 MED ORDER — MUPIROCIN 2 % EX OINT: 1.0000 "application " | TOPICAL_OINTMENT | Freq: Every day | CUTANEOUS | 0 refills | Status: DC

## 2018-09-15 NOTE — Patient Instructions (Addendum)
Zinc, elderberry, vitamin C  Before flying  Nosebleed, Adult A nosebleed is when blood comes out of the nose. Nosebleeds are common. Usually, they are not a sign of a serious condition. Nosebleeds can happen if a small blood vessel in your nose starts to bleed or if the lining of your nose (mucous membrane) cracks. They are commonly caused by:  Allergies.  Colds.  Picking your nose.  Blowing your nose too hard.  An injury from sticking an object into your nose or getting hit in the nose.  Dry or cold air.  Less common causes of nosebleeds include:  Toxic fumes.  Something abnormal in the nose or in the air-filled spaces in the bones of the face (sinuses).  Growths in the nose, such as polyps.  Medicines or conditions that cause blood to clot slowly.  Certain illnesses or procedures that irritate or dry out the nasal passages.  Follow these instructions at home: When you have a nosebleed:  Sit down and tilt your head slightly forward.  Use a clean towel or tissue to pinch your nostrils under the bony part of your nose. After 10 minutes, let go of your nose and see if bleeding starts again. Do not release pressure before that time. If there is still bleeding, repeat the pinching and holding for 10 minutes until the bleeding stops.  Do not place tissues or gauze in the nose to stop bleeding.  Avoid lying down and avoid tilting your head backward. That may make blood collect in the throat and cause gagging or coughing.  Use a nasal spray decongestant to help with a nosebleed as told by your health care provider.  Do not use petroleum jelly or mineral oil in your nose. It can drip into your lungs. After a nosebleed:  Avoid blowing your nose or sniffing for a number of hours.  Avoid straining, lifting, or bending at the waist for several days. You may resume other normal activities as you are able.  Use saline spray or a humidifier as told by your health care  provider.  Aspirinand blood thinners make bleeding more likely. If you are prescribed these medicines and you suffer from nosebleeds: ? Ask your health care provider if you should stop taking the medicines or if you should adjust the dose. ? Do not stop taking medicines that your health care provider has recommended unless told by your health care provider.  If your nosebleed was caused by dry mucous membranes, use over-the-counter saline nasal spray or gel. This will keep the mucous membranes moist and allow them to heal. If you must use a lubricant: ? Choose one that is water-soluble. ? Use only as much as you need and use it only as often as needed. ? Do not lie down until several hours after you use it. Contact a health care provider if:  You have a fever.  You get nosebleeds often or more often than usual.  You bruise very easily.  You have a nosebleed from having something stuck in your nose.  You have bleeding in your mouth.  You vomit or cough up brown material.  You have a nosebleed after you start a new medicine. Get help right away if:  You have a nosebleed after a fall or a head injury.  Your nosebleed does not go away after 20 minutes.  You feel dizzy or weak.  You have unusual bleeding from other parts of your body.  You have unusual bruising on other parts  of your body.  You become sweaty.  You vomit blood. This information is not intended to replace advice given to you by your health care provider. Make sure you discuss any questions you have with your health care provider. Document Released: 07/25/2005 Document Revised: 06/14/2016 Document Reviewed: 05/01/2016 Elsevier Interactive Patient Education  [employer].

## 2018-09-17 NOTE — Assessment & Plan Note (Signed)
Is tolerating the Citalopram bid. Using Xanax daily. He will notify us if any concerns develop. No changes today

## 2018-09-17 NOTE — Assessment & Plan Note (Addendum)
Ritalin adequate but would consider a switch to Adderall once the Citalopram dose is therapeutic.

## 2018-09-17 NOTE — Progress Notes (Signed)
Subjective:    Patient ID: Jesse Jones, male    DOB: 04-01-1981, 37 y.o.   MRN: [id number]  Chief Complaint  Patient presents with  . Follow-up    HPI Patient is in today for follow up. He is stable but has not noticed a huge difference yet since inceasing the Citalopram to twice daily. No worsening symptoms. Notes some anxiety and anhedonia but no suicidal ideation. No recent febrile illness or hospitalizations. Denies CP/palp/SOB/HA/congestion/fevers/GI or GU c/o. Taking meds as prescribed  Past Medical History:  Diagnosis Date  . ADD (attention deficit disorder)   . ADHD (attention deficit hyperactivity disorder) 04/02/2011  . 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 Anxiety   . 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 Concussion    X 6- 7  . 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 Depression   . ED (erectile dysfunction) 05/12/2012  . Hyperlipidemia, mild 04/18/2015  . Insomnia   . 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 Outbursts of anger   . Preventative health care 10/03/2011  . Tobacco abuse 04/28/2011    Past Surgical History:  Procedure Laterality Date  . NASAL SEPTUM SURGERY Bilateral    Dr Jenne Pane 2017  . punctured lung    . toe surgeries  during childhood    for ingrown toenails    Family History  Problem Relation Age of Onset  . Depression Mother   . Anxiety disorder Mother   . Depression Brother   . Diabetes Brother        type 1  . Cancer Paternal Grandmother        lung/ didn't smoke    Social History   Socioeconomic History  . Marital status: Single    Spouse name: Not on file  . Number of children: Not on file  . Years of education: Not on file  . Highest education level: Not on file  Occupational History  . Not on file  Social Needs  . Financial resource strain: Not on file  . Food insecurity:    Worry: Not on file    Inability: Not on file  . Transportation needs:    Medical: Not on file    Non-medical: Not on file  Tobacco Use  .  Smoking status: Current Every Day Smoker    Packs/day: 0.30    Years: 20.00    Pack years: 6.00    Types: Cigarettes  . Smokeless tobacco: Never Used  Substance and Sexual Activity  . Alcohol use: Yes    Alcohol/week: 36.0 standard drinks    Types: 36 Cans of beer per week    Comment: sober x68yr. No alcohol.  . Drug use: Yes    Types: Marijuana, Other-see comments    Comment: pt stopped 4 year ago  . Sexual activity: Yes    Partners: Female    Comment: lives alone and eating well. exercising  Lifestyle  . Physical activity:    Days per week: Not on file    Minutes per session: Not on file  . Stress: Not on file  Relationships  . Social connections:    Talks on phone: Not on file    Gets together: Not on file    Attends religious service: Not on file    Active member of club or organization: Not on file    Attends meetings of clubs or organizations: Not on file    Relationship status: Not on file  . Intimate partner violence:    Fear  of current or ex partner: Not on file    Emotionally abused: Not on file    Physically abused: Not on file    Forced sexual activity: Not on file  Other Topics Concern  . Not on file  Social History Narrative  . Not on file    Outpatient Medications Prior to Visit  Medication Sig Dispense Refill  . ALPRAZolam (XANAX XR) 3 MG 24 hr tablet TAKE 1 TABLET BY MOUTH EVERY MORNING 30 tablet 1  . cholecalciferol (VITAMIN D) 1000 UNITS tablet Take 1,000 Units by mouth every morning.    . clindamycin-benzoyl peroxide (BENZACLIN) gel Apply topically 2 (two) times daily. 25 g 2  . fish oil-omega-3 fatty acids 1000 MG capsule Take 2 g by mouth daily.    . fluticasone (FLONASE) 50 MCG/ACT nasal spray Place 2 sprays into both nostrils daily as needed for allergies or rhinitis. 16 g 6  . methylphenidate (RITALIN) 20 MG tablet Take 1 tablet (20 mg total) by mouth 3 (three) times daily. 90 tablet 0  . Multiple Vitamin (MULTIVITAMIN) tablet Take 1 tablet by  mouth every morning.     . nicotine (NICODERM CQ) 7 mg/24hr patch Place 1 patch (7 mg total) onto the skin daily. 28 patch 1  . vitamin B-12 (CYANOCOBALAMIN) 1000 MCG tablet Take 1,000 mcg by mouth every morning.    . vitamin C (ASCORBIC ACID) 500 MG tablet Take 500 mg by mouth daily.    . citalopram (CELEXA) 20 MG tablet TAKE 1 TABLET BY MOUTH EVERY DAY 90 tablet 1   No facility-administered medications prior to visit.     Allergies  Allergen Reactions  . Haloperidol Decanoate   . Hydroxyzine Anxiety    EXCESSIVE ANXIOUNESS  . Other Other (See Comments)    Reverse effect, super hyper  . Penicillins Hives  . Valium     Review of Systems  Constitutional: Negative for fever and malaise/fatigue.  HENT: Negative for congestion.   Eyes: Negative for blurred vision.  Respiratory: Negative for shortness of breath.   Cardiovascular: Negative for chest pain, palpitations and leg swelling.  Gastrointestinal: Negative for abdominal pain, blood in stool and nausea.  Genitourinary: Negative for dysuria and frequency.  Musculoskeletal: Negative for falls.  Skin: Negative for rash.  Neurological: Negative for dizziness, loss of consciousness and headaches.  Endo/Heme/Allergies: Negative for environmental allergies.  Psychiatric/Behavioral: Negative for depression. The patient is nervous/anxious.        Objective:    Physical Exam  Constitutional: He is oriented to person, place, and time. He appears well-developed and well-nourished. No distress.  HENT:  Head: Normocephalic and atraumatic.  Nose: Nose normal.  Eyes: Right eye exhibits no discharge. Left eye exhibits no discharge.  Neck: Normal range of motion. Neck supple.  Cardiovascular: Normal rate and regular rhythm.  No murmur heard. Pulmonary/Chest: Effort normal and breath sounds normal.  Abdominal: Soft. Bowel sounds are normal. There is no tenderness.  Musculoskeletal: He exhibits no edema.  Neurological: He is alert and  oriented to person, place, and time.  Skin: Skin is warm and dry.  Psychiatric: He has a normal mood and affect.  Nursing note and vitals reviewed.   BP 118/87 (BP Location: Left Arm, Patient Position: Sitting, Cuff Size: Normal)   Pulse 85   Temp 98.4 F (36.9 C) (Oral)   Resp 18   Wt 170 lb (77.1 kg)   SpO2 100%   BMI 21.83 kg/m  Wt Readings from Last 3 Encounters:  09/15/18 170 lb (77.1 kg)  07/29/18 164 lb (74.4 kg)  06/19/18 177 lb 3.2 oz (80.4 kg)     Lab Results  Component Value Date   WBC 8.3 03/26/2017   HGB 16.1 03/26/2017   HCT 47.3 03/26/2017   PLT 259.0 03/26/2017   GLUCOSE 88 09/27/2017   CHOL 154 03/26/2017   TRIG 107.0 03/26/2017   HDL 61.40 03/26/2017   LDLCALC 71 03/26/2017   ALT 31 09/27/2017   AST 27 09/27/2017   NA 139 09/27/2017   K 4.5 09/27/2017   CL 100 09/27/2017   CREATININE 0.92 09/27/2017   BUN 9 09/27/2017   CO2 33 (H) 09/27/2017   TSH 3.64 03/26/2017    Lab Results  Component Value Date   TSH 3.64 03/26/2017   Lab Results  Component Value Date   WBC 8.3 03/26/2017   HGB 16.1 03/26/2017   HCT 47.3 03/26/2017   MCV 87.0 03/26/2017   PLT 259.0 03/26/2017   Lab Results  Component Value Date   NA 139 09/27/2017   K 4.5 09/27/2017   CO2 33 (H) 09/27/2017   GLUCOSE 88 09/27/2017   BUN 9 09/27/2017   CREATININE 0.92 09/27/2017   BILITOT 0.4 09/27/2017   ALKPHOS 65 09/27/2017   AST 27 09/27/2017   ALT 31 09/27/2017   PROT 7.2 09/27/2017   ALBUMIN 4.7 09/27/2017   CALCIUM 10.0 09/27/2017   GFR 99.45 09/27/2017   Lab Results  Component Value Date   CHOL 154 03/26/2017   Lab Results  Component Value Date   HDL 61.40 03/26/2017   Lab Results  Component Value Date   LDLCALC 71 03/26/2017   Lab Results  Component Value Date   TRIG 107.0 03/26/2017   Lab Results  Component Value Date   CHOLHDL 3 03/26/2017   No results found for: HGBA1C     Assessment & Plan:   Problem List Items Addressed This Visit     ADHD (attention deficit hyperactivity disorder)    Ritalin adequate but would consider a switch to Adderall once the Citalopram dose is therapeutic.       GAD (generalized anxiety disorder)    Is tolerating the Citalopram bid. Using Xanax daily. He will notify us if any concerns develop. No changes today      Relevant Medications   citalopram (CELEXA) 20 MG tablet   Tobacco abuse    Encouraged complete cessation. Discussed need to quit as relates to risk of numerous cancers, cardiac and pulmonary disease as well as neurologic complications. Counseled for greater than 3 minutes      Tachycardia    RRR today       Other Visit Diagnoses    High risk medication use    -  Primary   Relevant Orders   Pain Mgmt, Profile 8 w/Conf, U      I have discontinued Jesse KielMatthew A. Hartung "Jesse Jones"'s mupirocin ointment. I have also changed his citalopram. Additionally, I am having him maintain his multivitamin, vitamin B-12, cholecalciferol, fish oil-omega-3 fatty acids, vitamin C, fluticasone, clindamycin-benzoyl peroxide, nicotine, methylphenidate, and ALPRAZolam.  Meds ordered this encounter  Medications  . citalopram (CELEXA) 20 MG tablet    Sig: Take 1 tablet (20 mg total) by mouth 2 (two) times daily.    Dispense:  180 tablet    Refill:  1  . DISCONTD: mupirocin ointment (BACTROBAN) 2 %    Sig: Place 1 application into the nose at bedtime. Apply with qtip  Dispense:  22 g    Refill:  0     Danise Edge, MD

## 2018-09-17 NOTE — Assessment & Plan Note (Signed)
RRR today 

## 2018-09-17 NOTE — Assessment & Plan Note (Addendum)
Encouraged complete cessation. Discussed need to quit as relates to risk of numerous cancers, cardiac and pulmonary disease as well as neurologic complications. Counseled for greater than 3 minutes 

## 2018-09-23 ENCOUNTER — Encounter: Payer: Self-pay | Admitting: Family Medicine

## 2018-09-23 ENCOUNTER — Other Ambulatory Visit: Payer: Self-pay | Admitting: Family Medicine

## 2018-09-23 DIAGNOSIS — F988 Other specified behavioral and emotional disorders with onset usually occurring in childhood and adolescence: Secondary | ICD-10-CM

## 2018-09-23 MED ORDER — AMPHETAMINE-DEXTROAMPHETAMINE 20 MG PO TABS: 20.0000 mg | ORAL_TABLET | Freq: Two times a day (BID) | ORAL | 0 refills | Status: DC

## 2018-09-30 ENCOUNTER — Ambulatory Visit (INDEPENDENT_AMBULATORY_CARE_PROVIDER_SITE_OTHER): Payer: Medicare Other | Admitting: Family Medicine

## 2018-09-30 ENCOUNTER — Encounter: Payer: Self-pay | Admitting: Family Medicine

## 2018-09-30 VITALS — BP 102/82 | HR 85 | Temp 98.5°F | Resp 18 | Ht 74.0 in | Wt 163.6 lb

## 2018-09-30 DIAGNOSIS — F329 Major depressive disorder, single episode, unspecified: Secondary | ICD-10-CM | POA: Diagnosis not present

## 2018-09-30 DIAGNOSIS — F17210 Nicotine dependence, cigarettes, uncomplicated: Secondary | ICD-10-CM | POA: Diagnosis not present

## 2018-09-30 DIAGNOSIS — F419 Anxiety disorder, unspecified: Secondary | ICD-10-CM

## 2018-09-30 DIAGNOSIS — F908 Attention-deficit hyperactivity disorder, other type: Secondary | ICD-10-CM | POA: Diagnosis not present

## 2018-09-30 DIAGNOSIS — R000 Tachycardia, unspecified: Secondary | ICD-10-CM

## 2018-09-30 DIAGNOSIS — J342 Deviated nasal septum: Secondary | ICD-10-CM

## 2018-09-30 DIAGNOSIS — F988 Other specified behavioral and emotional disorders with onset usually occurring in childhood and adolescence: Secondary | ICD-10-CM | POA: Diagnosis not present

## 2018-09-30 DIAGNOSIS — E785 Hyperlipidemia, unspecified: Secondary | ICD-10-CM

## 2018-09-30 DIAGNOSIS — F17219 Nicotine dependence, cigarettes, with unspecified nicotine-induced disorders: Secondary | ICD-10-CM | POA: Diagnosis not present

## 2018-09-30 DIAGNOSIS — F32A Depression, unspecified: Secondary | ICD-10-CM

## 2018-09-30 DIAGNOSIS — F411 Generalized anxiety disorder: Secondary | ICD-10-CM

## 2018-09-30 DIAGNOSIS — F1011 Alcohol abuse, in remission: Secondary | ICD-10-CM

## 2018-09-30 DIAGNOSIS — Z0000 Encounter for general adult medical examination without abnormal findings: Secondary | ICD-10-CM

## 2018-09-30 LAB — LIPID PANEL
CHOLESTEROL: 163 mg/dL (ref 0–200)
HDL: 59.9 mg/dL (ref >39.00)
LDL Cholesterol: 91 mg/dL (ref 0–99)
NONHDL: 103.17
Total CHOL/HDL Ratio: 3
Triglycerides: 62 mg/dL (ref 0.0–149.0)
VLDL: 12.4 mg/dL (ref 0.0–40.0)

## 2018-09-30 LAB — COMPREHENSIVE METABOLIC PANEL
ALK PHOS: 73 U/L (ref 39–117)
ALT: 18 U/L (ref 0–53)
AST: 17 U/L (ref 0–37)
Albumin: 5.1 g/dL (ref 3.5–5.2)
BUN: 10 mg/dL (ref 6–23)
CALCIUM: 10.1 mg/dL (ref 8.4–10.5)
CO2: 33 mEq/L — ABNORMAL HIGH (ref 19–32)
CREATININE: 1.01 mg/dL (ref 0.40–1.50)
Chloride: 98 mEq/L (ref 96–112)
GFR: 88.78 mL/min (ref >60.00)
GLUCOSE: 93 mg/dL (ref 70–99)
Potassium: 4.7 mEq/L (ref 3.5–5.1)
Sodium: 137 mEq/L (ref 135–145)
TOTAL PROTEIN: 7.3 g/dL (ref 6.0–8.3)
Total Bilirubin: 0.6 mg/dL (ref 0.2–1.2)

## 2018-09-30 LAB — CBC
HCT: 48.6 % (ref 39.0–52.0)
Hemoglobin: 16.6 g/dL (ref 13.0–17.0)
MCHC: 34.2 g/dL (ref 30.0–36.0)
MCV: 87.2 fl (ref 78.0–100.0)
PLATELETS: 291 10*3/uL (ref 150.0–400.0)
RBC: 5.57 Mil/uL (ref 4.22–5.81)
RDW: 13.5 % (ref 11.5–15.5)
WBC: 7.5 10*3/uL (ref 4.0–10.5)

## 2018-09-30 LAB — TSH: TSH: 1.82 u[IU]/mL (ref 0.35–4.50)

## 2018-09-30 MED ORDER — AMPHETAMINE-DEXTROAMPHETAMINE 30 MG PO TABS: 30.0000 mg | ORAL_TABLET | Freq: Two times a day (BID) | ORAL | 0 refills | Status: DC

## 2018-09-30 NOTE — Patient Instructions (Signed)
Preventive Care 18-39 Years, Male Preventive care refers to lifestyle choices and visits with your health care provider that can promote health and wellness. What does preventive care include?  A yearly physical exam. This is also called an annual well check.  Dental exams once or twice a year.  Routine eye exams. Ask your health care provider how often you should have your eyes checked.  Personal lifestyle choices, including: ? Daily care of your teeth and gums. ? Regular physical activity. ? Eating a healthy diet. ? Avoiding tobacco and drug use. ? Limiting alcohol use. ? Practicing safe sex. What happens during an annual well check? The services and screenings done by your health care provider during your annual well check will depend on your age, overall health, lifestyle risk factors, and family history of disease. Counseling Your health care provider may ask you questions about your:  Alcohol use.  Tobacco use.  Drug use.  Emotional well-being.  Home and relationship well-being.  Sexual activity.  Eating habits.  Work and work Statistician.  Screening You may have the following tests or measurements:  Height, weight, and BMI.  Blood pressure.  Lipid and cholesterol levels. These may be checked every 5 years starting at age 34.  Diabetes screening. This is done by checking your blood sugar (glucose) after you have not eaten for a while (fasting).  Skin check.  Hepatitis C blood test.  Hepatitis B blood test.  Sexually transmitted disease (STD) testing.  Discuss your test results, treatment options, and if necessary, the need for more tests with your health care provider. Vaccines Your health care provider may recommend certain vaccines, such as:  Influenza vaccine. This is recommended every year.  Tetanus, diphtheria, and acellular pertussis (Tdap, Td) vaccine. You may need a Td booster every 10 years.  Varicella vaccine. You may need this if you  have not been vaccinated.  HPV vaccine. If you are 23 or younger, you may need three doses over 6 months.  Measles, mumps, and rubella (MMR) vaccine. You may need at least one dose of MMR.You may also need a second dose.  Pneumococcal 13-valent conjugate (PCV13) vaccine. You may need this if you have certain conditions and have not been vaccinated.  Pneumococcal polysaccharide (PPSV23) vaccine. You may need one or two doses if you smoke cigarettes or if you have certain conditions.  Meningococcal vaccine. One dose is recommended if you are age 65-21 years and a first-year college student living in a residence hall, or if you have one of several medical conditions. You may also need additional booster doses.  Hepatitis A vaccine. You may need this if you have certain conditions or if you travel or work in places where you may be exposed to hepatitis A.  Hepatitis B vaccine. You may need this if you have certain conditions or if you travel or work in places where you may be exposed to hepatitis B.  Haemophilus influenzae type b (Hib) vaccine. You may need this if you have certain risk factors.  Talk to your health care provider about which screenings and vaccines you need and how often you need them. This information is not intended to replace advice given to you by your health care provider. Make sure you discuss any questions you have with your health care provider. Document Released: 12/11/2001 Document Revised: 07/04/2016 Document Reviewed: 08/16/2015 Elsevier Interactive Patient Education  [employer].

## 2018-09-30 NOTE — Assessment & Plan Note (Signed)
Encouraged heart healthy diet, increase exercise, avoid trans fats, consider a krill oil cap daily 

## 2018-09-30 NOTE — Assessment & Plan Note (Signed)
Patient encouraged to maintain heart healthy diet, regular exercise, adequate sleep. Consider daily probiotics. Take medications as prescribed. Labs ordered and reviewed 

## 2018-10-01 NOTE — Assessment & Plan Note (Signed)
Encouraged complete cessation. Discussed need to quit as relates to risk of numerous cancers, cardiac and pulmonary disease as well as neurologic complications. Counseled for greater than 3 minutes. Is ready to try quitting again and is under a 1/2 ppd will try 14 mcg patch first then titrate down to 7 mcg patch then stop.

## 2018-10-01 NOTE — Assessment & Plan Note (Addendum)
Patient with increased anxiety and some mild depression. Will continue meds and referred to behavioral health for some talk therapy. Check TSH

## 2018-10-01 NOTE — Assessment & Plan Note (Signed)
RRR today 

## 2018-10-01 NOTE — Assessment & Plan Note (Signed)
Doing better back on Adderall but has been on the 30 mg dose in past with better results so will try that dose BID and reassess

## 2018-10-01 NOTE — Progress Notes (Addendum)
Subjective:    Patient ID: Jesse Jones, male    DOB: 03-11-1981, 37 y.o.   MRN: [id number]  No chief complaint on file.   HPI Patient is in today for annual preventative exam and follow-up on chronic medical conditions including ADD, tachycardia, tobacco abuse and hyperlipidemia.  He feels well today.  He reports that switch from Ritalin back to Adderall has been helpful although in the past he has been on the 30 mg dose and found that more helpful.  He denies any recent febrile illness or hospitalization.  No acute concerns otherwise noted today.  He is ready to try to quit smoking once again and is going to use the nicotine patches.  He manages his activities of daily living well and tries to maintain a heart healthy diet. Denies CP/palp/SOB/HA/congestion/fevers/GI or GU c/o. Taking meds as prescribed  Past Medical History:  Diagnosis Date  . ADD (attention deficit disorder)   . ADHD (attention deficit hyperactivity disorder) 04/02/2011  . 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 Anxiety   . 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 Concussion    X 6- 7  . 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 Depression   . ED (erectile dysfunction) 05/12/2012  . Hyperlipidemia, mild 04/18/2015  . Insomnia   . 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 Outbursts of anger   . Preventative health care 10/03/2011  . Tobacco abuse 04/28/2011    Past Surgical History:  Procedure Laterality Date  . NASAL SEPTUM SURGERY Bilateral    Dr Jenne Pane 2017  . punctured lung    . toe surgeries  during childhood    for ingrown toenails    Family History  Problem Relation Age of Onset  . Depression Mother   . Anxiety disorder Mother   . Depression Brother   . Diabetes Brother        type 1  . Cancer Paternal Grandmother        lung/ didn't smoke    Social History   Socioeconomic History  . Marital status: Single    Spouse name: Not on file  . Number of children: Not on file  . Years of education: Not on  file  . Highest education level: Not on file  Occupational History  . Not on file  Social Needs  . Financial resource strain: Not on file  . Food insecurity:    Worry: Not on file    Inability: Not on file  . Transportation needs:    Medical: Not on file    Non-medical: Not on file  Tobacco Use  . Smoking status: Current Every Day Smoker    Packs/day: 0.30    Years: 20.00    Pack years: 6.00    Types: Cigarettes  . Smokeless tobacco: Never Used  Substance and Sexual Activity  . Alcohol use: Yes    Alcohol/week: 36.0 standard drinks    Types: 36 Cans of beer per week    Comment: sober x46yr. No alcohol.  . Drug use: Yes    Types: Marijuana, Other-see comments    Comment: pt stopped 4 year ago  . Sexual activity: Yes    Partners: Female    Comment: lives alone and eating well. exercising  Lifestyle  . Physical activity:    Days per week: Not on file    Minutes per session: Not on file  . Stress: Not on file  Relationships  . Social connections:    Talks on phone: Not  on file    Gets together: Not on file    Attends religious service: Not on file    Active member of club or organization: Not on file    Attends meetings of clubs or organizations: Not on file    Relationship status: Not on file  . Intimate partner violence:    Fear of current or ex partner: Not on file    Emotionally abused: Not on file    Physically abused: Not on file    Forced sexual activity: Not on file  Other Topics Concern  . Not on file  Social History Narrative  . Not on file    Outpatient Medications Prior to Visit  Medication Sig Dispense Refill  . ALPRAZolam (XANAX XR) 3 MG 24 hr tablet TAKE 1 TABLET BY MOUTH EVERY MORNING 30 tablet 1  . cholecalciferol (VITAMIN D) 1000 UNITS tablet Take 1,000 Units by mouth every morning.    . citalopram (CELEXA) 20 MG tablet Take 1 tablet (20 mg total) by mouth 2 (two) times daily. 180 tablet 1  . clindamycin-benzoyl peroxide (BENZACLIN) gel Apply  topically 2 (two) times daily. 25 g 2  . fish oil-omega-3 fatty acids 1000 MG capsule Take 2 g by mouth daily.    . fluticasone (FLONASE) 50 MCG/ACT nasal spray Place 2 sprays into both nostrils daily as needed for allergies or rhinitis. 16 g 6  . Multiple Vitamin (MULTIVITAMIN) tablet Take 1 tablet by mouth every morning.     . nicotine (NICODERM CQ) 7 mg/24hr patch Place 1 patch (7 mg total) onto the skin daily. 28 patch 1  . vitamin B-12 (CYANOCOBALAMIN) 1000 MCG tablet Take 1,000 mcg by mouth every morning.    . vitamin C (ASCORBIC ACID) 500 MG tablet Take 500 mg by mouth daily.    Marland Kitchen amphetamine-dextroamphetamine (ADDERALL) 20 MG tablet Take 1 tablet (20 mg total) by mouth 2 (two) times daily. November 2019 60 tablet 0   No facility-administered medications prior to visit.     Allergies  Allergen Reactions  . Haloperidol Decanoate   . Hydroxyzine Anxiety    EXCESSIVE ANXIOUNESS  . Other Other (See Comments)    Reverse effect, super hyper  . Penicillins Hives  . Valium     Review of Systems  Constitutional: Negative for chills, fever and malaise/fatigue.  HENT: Negative for congestion and hearing loss.   Eyes: Negative for discharge.  Respiratory: Negative for cough, sputum production and shortness of breath.   Cardiovascular: Negative for chest pain, palpitations and leg swelling.  Gastrointestinal: Negative for abdominal pain, blood in stool, constipation, diarrhea, heartburn, nausea and vomiting.  Genitourinary: Negative for dysuria, frequency, hematuria and urgency.  Musculoskeletal: Negative for back pain, falls and myalgias.  Skin: Negative for rash.  Neurological: Negative for dizziness, sensory change, loss of consciousness, weakness and headaches.  Endo/Heme/Allergies: Negative for environmental allergies. Does not bruise/bleed easily.  Psychiatric/Behavioral: Positive for depression. Negative for hallucinations, substance abuse and suicidal ideas. The patient is  nervous/anxious. The patient does not have insomnia.        Objective:    Physical Exam  Constitutional: He is oriented to person, place, and time. He appears well-developed and well-nourished. No distress.  HENT:  Head: Normocephalic and atraumatic.  Right Ear: External ear normal.  Left Ear: External ear normal.  Nose: Nose normal.  Eyes: Pupils are equal, round, and reactive to light. Conjunctivae and EOM are normal. Right eye exhibits no discharge. Left eye exhibits no discharge.  Neck: Normal range of motion. Neck supple.  Cardiovascular: Normal rate, regular rhythm and normal heart sounds.  No murmur heard. Pulmonary/Chest: Effort normal and breath sounds normal. No respiratory distress. He has no wheezes.  Abdominal: Soft. Bowel sounds are normal. There is no tenderness.  Musculoskeletal: He exhibits deformity. He exhibits no edema.  Neurological: He is alert and oriented to person, place, and time. He displays normal reflexes. No cranial nerve deficit. He exhibits normal muscle tone. Coordination normal.  Skin: Skin is warm and dry.  Psychiatric: He has a normal mood and affect.  Nursing note and vitals reviewed.   BP 102/82 (BP Location: Left Arm, Patient Position: Sitting, Cuff Size: Normal)   Pulse 85   Temp 98.5 F (36.9 C) (Oral)   Resp 18   Ht 6\' 2"  (1.88 m)   Wt 163 lb 9.6 oz (74.2 kg)   SpO2 98%   BMI 21.00 kg/m  Wt Readings from Last 3 Encounters:  09/30/18 163 lb 9.6 oz (74.2 kg)  09/15/18 170 lb (77.1 kg)  07/29/18 164 lb (74.4 kg)     Lab Results  Component Value Date   WBC 7.5 09/30/2018   HGB 16.6 09/30/2018   HCT 48.6 09/30/2018   PLT 291.0 09/30/2018   GLUCOSE 93 09/30/2018   CHOL 163 09/30/2018   TRIG 62.0 09/30/2018   HDL 59.90 09/30/2018   LDLCALC 91 09/30/2018   ALT 18 09/30/2018   AST 17 09/30/2018   NA 137 09/30/2018   K 4.7 09/30/2018   CL 98 09/30/2018   CREATININE 1.01 09/30/2018   BUN 10 09/30/2018   CO2 33 (H) 09/30/2018     TSH 1.82 09/30/2018    Lab Results  Component Value Date   TSH 1.82 09/30/2018   Lab Results  Component Value Date   WBC 7.5 09/30/2018   HGB 16.6 09/30/2018   HCT 48.6 09/30/2018   MCV 87.2 09/30/2018   PLT 291.0 09/30/2018   Lab Results  Component Value Date   NA 137 09/30/2018   K 4.7 09/30/2018   CO2 33 (H) 09/30/2018   GLUCOSE 93 09/30/2018   BUN 10 09/30/2018   CREATININE 1.01 09/30/2018   BILITOT 0.6 09/30/2018   ALKPHOS 73 09/30/2018   AST 17 09/30/2018   ALT 18 09/30/2018   PROT 7.3 09/30/2018   ALBUMIN 5.1 09/30/2018   CALCIUM 10.1 09/30/2018   GFR 88.78 09/30/2018   Lab Results  Component Value Date   CHOL 163 09/30/2018   Lab Results  Component Value Date   HDL 59.90 09/30/2018   Lab Results  Component Value Date   LDLCALC 91 09/30/2018   Lab Results  Component Value Date   TRIG 62.0 09/30/2018   Lab Results  Component Value Date   CHOLHDL 3 09/30/2018   No results found for: HGBA1C     Assessment & Plan:   Problem List Items Addressed This Visit    ADHD (attention deficit hyperactivity disorder)    Doing better back on Adderall but has been on the 30 mg dose in past with better results so will try that dose BID and reassess      Anxiety    Patient with increased anxiety and some mild depression. Will continue meds and referred to behavioral health for some talk therapy. Check TSH      Nicotine dependence    Encouraged complete cessation. Discussed need to quit as relates to risk of numerous cancers, cardiac and pulmonary disease as well  as neurologic complications. Counseled for greater than 3 minutes. Is ready to try quitting again and is under a 1/2 ppd will try 14 mcg patch first then titrate down to 7 mcg patch then stop.      Preventative health care - Primary    Patient encouraged to maintain heart healthy diet, regular exercise, adequate sleep. Consider daily probiotics. Take medications as prescribed. Labs ordered and  reviewed      Relevant Orders   TSH (Completed)   CBC (Completed)   Comprehensive metabolic panel (Completed)   Tachycardia    RRR today      Relevant Orders   CBC (Completed)   Alcohol abuse, in remission   Relevant Orders   CBC (Completed)   Comprehensive metabolic panel (Completed)   Ambulatory referral to Behavioral Health   Hyperlipidemia, mild    Encouraged heart healthy diet, increase exercise, avoid trans fats, consider a krill oil cap daily      Relevant Orders   Lipid panel (Completed)   Nasal septal deviation    Other Visit Diagnoses    Attention deficit disorder, unspecified hyperactivity presence       Relevant Orders   Ambulatory referral to Behavioral Health   Depression, unspecified depression type       Relevant Orders   Ambulatory referral to Behavioral Health      I have discontinued Jesse Jones "Matt"'s amphetamine-dextroamphetamine. I am also having him start on amphetamine-dextroamphetamine and amphetamine-dextroamphetamine. Additionally, I am having him maintain his multivitamin, vitamin B-12, cholecalciferol, fish oil-omega-3 fatty acids, vitamin C, fluticasone, clindamycin-benzoyl peroxide, nicotine, ALPRAZolam, and citalopram.  Meds ordered this encounter  Medications  . amphetamine-dextroamphetamine (ADDERALL) 30 MG tablet    Sig: Take 1 tablet by mouth 2 (two) times daily. December 2019    Dispense:  60 tablet    Refill:  0  . amphetamine-dextroamphetamine (ADDERALL) 30 MG tablet    Sig: Take 1 tablet by mouth 2 (two) times daily. January 2020    Dispense:  60 tablet    Refill:  0     Danise Edge, MD

## 2018-10-08 MED FILL — ALPRAZolam ER 3 MG TB24: 3 | 30 days supply | Qty: 30 | Fill #1

## 2018-10-27 ENCOUNTER — Telehealth: Payer: Self-pay | Admitting: Family Medicine

## 2018-10-27 ENCOUNTER — Encounter: Payer: Self-pay | Admitting: *Deleted

## 2018-10-27 ENCOUNTER — Other Ambulatory Visit: Payer: Self-pay | Admitting: Family Medicine

## 2018-10-27 MED ORDER — AMPHETAMINE-DEXTROAMPHETAMINE 30 MG PO TABS: 30.0000 mg | ORAL_TABLET | Freq: Two times a day (BID) | ORAL | 0 refills | Status: DC

## 2018-10-27 NOTE — Telephone Encounter (Signed)
This encounter was created in error - please disregard.

## 2018-10-27 NOTE — Telephone Encounter (Signed)
Pt reports per pharmacy he can not pick up RX for adderall until 11/06/2018 as he did not pick up RX until 10/08/18. Pt asking if Dr. Abner GreenspanBlyth "Can do anything about this." States "I'm a loose cannon without it."  Pt has appt 12/01/2018.

## 2018-10-28 NOTE — Telephone Encounter (Signed)
[place] from [facility] . NO [facility] RN available. Please call back and patient says he uses mychart as well.

## 2018-10-28 NOTE — Telephone Encounter (Signed)
Spoke with Pharmacist at [facility] he stated he can not release medication until 11/06/2018 to patient. If he is taking them BID patient should still have 18 pills left out of the 60 he received on 10/08/18.  The following is what the pharmacy listed as to what the patient should have.  9/19  #60  30mg  10/16 #60  30mg  10/29  #60 20mg  11/26  #60  20mg  12/11 #60  30mg  Pharmacist states if the 20 mg did not work for patient he should at least still have some of the 20mg  left, in order to hold him over until 11/06/2018.  Called patient left voicemail for patient to call the office back.  Nurse triage may handle

## 2018-10-30 ENCOUNTER — Telehealth: Payer: Self-pay | Admitting: Family Medicine

## 2018-10-30 NOTE — Telephone Encounter (Signed)
Left message for pt to return my call. Ok for triage to discuss with pt.

## 2018-10-30 NOTE — Telephone Encounter (Signed)
Received call from patient.  Pt given message from office from [facility] 10/28/18. Pt verbalized understanding.

## 2018-11-04 ENCOUNTER — Other Ambulatory Visit: Payer: Self-pay | Admitting: Family Medicine

## 2018-11-04 MED ORDER — ALPRAZOLAM ER 3 MG PO TB24: 3.0000 mg | ORAL_TABLET | Freq: Every morning | ORAL | 1 refills | Status: DC

## 2018-11-04 MED ORDER — AMPHETAMINE-DEXTROAMPHETAMINE 30 MG PO TABS: 30.0000 mg | ORAL_TABLET | Freq: Two times a day (BID) | ORAL | 0 refills | Status: DC

## 2018-11-04 MED FILL — DEXTROAMPH TB 30MG NSTR 100: 30 | 30 days supply | Qty: 60 | Fill #0

## 2018-11-04 NOTE — Telephone Encounter (Signed)
Copied from CRM [id number]. Topic: Quick Communication - Rx Refill/Question >> Nov 04, 2018 12:08 PM Lynne Logan D wrote: Medication: ALPRAZolam (XANAX XR) 3 MG 24 hr tablet /  amphetamine-dextroamphetamine (ADDERALL) 30 MG tablet  Pt would also like to request to change his ADDERALL from 20mg  2x a day to 20MG  3x a day. He stated he feels he is running out too fast. Please advise  Has the patient contacted their pharmacy? Yes.   (Agent: If no, request that the patient contact the pharmacy for the refill.) (Agent: If yes, when and what did the pharmacy advise?)  Preferred Pharmacy (with phone number or street name): [facility] - [place], [place] - [phone] (Phone) [phone] (Fax)  Agent: Please be advised that RX refills may take up to 3 business days. We ask that you follow-up with your pharmacy.

## 2018-11-06 MED FILL — ALPRAZolam ER 3 MG TB24: 3 | 30 days supply | Qty: 30 | Fill #0

## 2018-11-12 ENCOUNTER — Encounter: Payer: Self-pay | Admitting: Family Medicine

## 2018-11-15 ENCOUNTER — Encounter: Payer: Self-pay | Admitting: Family Medicine

## 2018-11-18 NOTE — Telephone Encounter (Signed)
Patient states: "started taking my citalopram and am feeling less manic, calmer, I have an upcoming appointment on February 3. Do I still have to schedule an appointment. Thanks Matt."   Please advise

## 2018-12-01 ENCOUNTER — Encounter: Payer: Self-pay | Admitting: Family Medicine

## 2018-12-01 ENCOUNTER — Ambulatory Visit (INDEPENDENT_AMBULATORY_CARE_PROVIDER_SITE_OTHER): Payer: Medicare Other | Admitting: Family Medicine

## 2018-12-01 DIAGNOSIS — K5900 Constipation, unspecified: Secondary | ICD-10-CM | POA: Diagnosis not present

## 2018-12-01 DIAGNOSIS — R000 Tachycardia, unspecified: Secondary | ICD-10-CM | POA: Diagnosis not present

## 2018-12-01 DIAGNOSIS — F419 Anxiety disorder, unspecified: Secondary | ICD-10-CM

## 2018-12-01 DIAGNOSIS — F908 Attention-deficit hyperactivity disorder, other type: Secondary | ICD-10-CM

## 2018-12-01 MED ORDER — AMPHETAMINE-DEXTROAMPHETAMINE 20 MG PO TABS: 20.0000 mg | ORAL_TABLET | Freq: Three times a day (TID) | ORAL | 0 refills | Status: DC

## 2018-12-01 MED ORDER — CITALOPRAM HYDROBROMIDE 20 MG PO TABS: 20.0000 mg | ORAL_TABLET | Freq: Two times a day (BID) | ORAL | 1 refills | Status: DC

## 2018-12-01 NOTE — Patient Instructions (Signed)
Add Benefiber with Miralax once to twice daily Constipation, Adult Constipation is when a person has fewer bowel movements in a week than normal, has difficulty having a bowel movement, or has stools that are dry, hard, or larger than normal. Constipation may be caused by an underlying condition. It may become worse with age if a person takes certain medicines and does not take in enough fluids. Follow these instructions at home: Eating and drinking   Eat foods that have a lot of fiber, such as fresh fruits and vegetables, whole grains, and beans.  Limit foods that are high in fat, low in fiber, or overly processed, such as french fries, hamburgers, cookies, candies, and soda.  Drink enough fluid to keep your urine clear or pale yellow. General instructions  Exercise regularly or as told by your health care provider.  Go to the restroom when you have the urge to go. Do not hold it in.  Take over-the-counter and prescription medicines only as told by your health care provider. These include any fiber supplements.  Practice pelvic floor retraining exercises, such as deep breathing while relaxing the lower abdomen and pelvic floor relaxation during bowel movements.  Watch your condition for any changes.  Keep all follow-up visits as told by your health care provider. This is important. Contact a health care provider if:  You have pain that gets worse.  You have a fever.  You do not have a bowel movement after 4 days.  You vomit.  You are not hungry.  You lose weight.  You are bleeding from the anus.  You have thin, pencil-like stools. Get help right away if:  You have a fever and your symptoms suddenly get worse.  You leak stool or have blood in your stool.  Your abdomen is bloated.  You have severe pain in your abdomen.  You feel dizzy or you faint. This information is not intended to replace advice given to you by your health care provider. Make sure you discuss  any questions you have with your health care provider. Document Released: 07/13/2004 Document Revised: 05/04/2016 Document Reviewed: 04/04/2016 Elsevier Interactive Patient Education  2019 [employer].

## 2018-12-01 NOTE — Assessment & Plan Note (Signed)
Has been off of cigarettes for two weeks.

## 2018-12-01 NOTE — Assessment & Plan Note (Signed)
Improved on recheck. Is very anxious still cut down on caffeine and monitor

## 2018-12-01 NOTE — Assessment & Plan Note (Signed)
Is requesting a switch from Adderrall to 30 mg to 20 mg tid and recheck in 8 weeks.

## 2018-12-01 NOTE — Progress Notes (Signed)
Subjective:    Patient ID: Jesse Jones, male    DOB: 1981/09/18, 38 y.o.   MRN: [id number]  No chief complaint on file.   HPI  Patient is in today for 2 month follow up. He quit smoking 2 weeks ago! Pt is very optimistic about life and everything going on. He is doing very well overall. Pt has been experiencing some constipation since stopping cigarettes, but has been taking miralax to help that. He has been drinking lots of coffee since he quit as well which could be contributing to his tachycardia. No other acute complaints.  Patient Care Team: Bradd Canary, MD as PCP - General (Family Medicine) Christia Reading, MD as Consulting Physician (Otolaryngology)   Past Medical History:  Diagnosis Date  . ADD (attention deficit disorder)   . ADHD (attention deficit hyperactivity disorder) 04/02/2011  . 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 Anxiety   . 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 Concussion    X 6- 7  . 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 Depression   . ED (erectile dysfunction) 05/12/2012  . Hyperlipidemia, mild 04/18/2015  . Insomnia   . 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 Outbursts of anger   . Preventative health care 10/03/2011  . Tobacco abuse 04/28/2011    Past Surgical History:  Procedure Laterality Date  . NASAL SEPTUM SURGERY Bilateral    Dr Jenne Pane 2017  . punctured lung    . toe surgeries  during childhood    for ingrown toenails    Family History  Problem Relation Age of Onset  . Depression Mother   . Anxiety disorder Mother   . Depression Brother   . Diabetes Brother        type 1  . Cancer Paternal Grandmother        lung/ didn't smoke  . Cancer Maternal Uncle 83       colon cancer    Social History   Socioeconomic History  . Marital status: Single    Spouse name: Not on file  . Number of children: Not on file  . Years of education: Not on file  . Highest education level: Not on file  Occupational History  . Not on file  Social  Needs  . Financial resource strain: Not on file  . Food insecurity:    Worry: Not on file    Inability: Not on file  . Transportation needs:    Medical: Not on file    Non-medical: Not on file  Tobacco Use  . Smoking status: Current Every Day Smoker    Packs/day: 0.30    Years: 20.00    Pack years: 6.00    Types: Cigarettes  . Smokeless tobacco: Never Used  Substance and Sexual Activity  . Alcohol use: Yes    Alcohol/week: 36.0 standard drinks    Types: 36 Cans of beer per week    Comment: sober x76yr. No alcohol.  . Drug use: Yes    Types: Marijuana, Other-see comments    Comment: pt stopped 4 year ago  . Sexual activity: Yes    Partners: Female    Comment: lives alone and eating well. exercising  Lifestyle  . Physical activity:    Days per week: Not on file    Minutes per session: Not on file  . Stress: Not on file  Relationships  . Social connections:    Talks on phone: Not on file    Gets together: Not on  file    Attends religious service: Not on file    Active member of club or organization: Not on file    Attends meetings of clubs or organizations: Not on file    Relationship status: Not on file  . Intimate partner violence:    Fear of current or ex partner: Not on file    Emotionally abused: Not on file    Physically abused: Not on file    Forced sexual activity: Not on file  Other Topics Concern  . Not on file  Social History Narrative  . Not on file    Outpatient Medications Prior to Visit  Medication Sig Dispense Refill  . ALPRAZolam (XANAX XR) 3 MG 24 hr tablet Take 1 tablet (3 mg total) by mouth every morning. 30 tablet 1  . cholecalciferol (VITAMIN D) 1000 UNITS tablet Take 1,000 Units by mouth every morning.    . citalopram (CELEXA) 20 MG tablet Take 1 tablet (20 mg total) by mouth 2 (two) times daily. 180 tablet 1  . clindamycin-benzoyl peroxide (BENZACLIN) gel Apply topically 2 (two) times daily. 25 g 2  . fish oil-omega-3 fatty acids 1000 MG  capsule Take 2 g by mouth daily.    . fluticasone (FLONASE) 50 MCG/ACT nasal spray Place 2 sprays into both nostrils daily as needed for allergies or rhinitis. 16 g 6  . Multiple Vitamin (MULTIVITAMIN) tablet Take 1 tablet by mouth every morning.     . nicotine (NICODERM CQ) 7 mg/24hr patch Place 1 patch (7 mg total) onto the skin daily. 28 patch 1  . vitamin B-12 (CYANOCOBALAMIN) 1000 MCG tablet Take 1,000 mcg by mouth every morning.    . vitamin C (ASCORBIC ACID) 500 MG tablet Take 500 mg by mouth daily.    Marland Kitchen amphetamine-dextroamphetamine (ADDERALL) 30 MG tablet Take 1 tablet by mouth 2 (two) times daily. January 2020 60 tablet 0  . amphetamine-dextroamphetamine (ADDERALL) 30 MG tablet Take 1 tablet by mouth 2 (two) times daily. December 2019 60 tablet 0   No facility-administered medications prior to visit.     Allergies  Allergen Reactions  . Haloperidol Decanoate   . Hydroxyzine Anxiety    EXCESSIVE ANXIOUNESS  . Other Other (See Comments)    Reverse effect, super hyper  . Penicillins Hives  . Valium     Review of Systems  Constitutional: Negative for chills, fever, malaise/fatigue and weight loss.  HENT: Negative for hearing loss.   Eyes: Negative for blurred vision and double vision.  Respiratory: Negative for shortness of breath.   Cardiovascular: Negative for chest pain and palpitations.  Gastrointestinal: Positive for constipation. Negative for abdominal pain and diarrhea.  Neurological: Negative for dizziness and headaches.  Psychiatric/Behavioral: The patient is nervous/anxious.        Objective:     Jesse Jones appears his stated age, WDWN and in no acute distress.  Physical Exam Constitutional:      Appearance: Normal appearance.  HENT:     Head: Normocephalic and atraumatic.     Nose: Nose normal.  Neck:     Musculoskeletal: Neck supple.  Cardiovascular:     Rate and Rhythm: Regular rhythm. Tachycardia present.     Heart sounds: Normal heart sounds.    Pulmonary:     Effort: Pulmonary effort is normal.     Breath sounds: Normal breath sounds.  Abdominal:     General: Bowel sounds are normal.  Neurological:     Mental Status: He is alert and oriented  to person, place, and time.  Psychiatric:        Mood and Affect: Mood normal.        Behavior: Behavior normal.     BP 108/62 (BP Location: Left Arm, Patient Position: Sitting, Cuff Size: Normal)   Pulse (!) 107   Temp 98 F (36.7 C) (Oral)   Resp 18   Ht 6\' 2"  (1.88 m)   Wt 75.2 kg   SpO2 99%   BMI 21.29 kg/m  Wt Readings from Last 3 Encounters:  12/01/18 75.2 kg  09/30/18 74.2 kg  09/15/18 77.1 kg   BP Readings from Last 3 Encounters:  12/01/18 108/62  09/30/18 102/82  09/15/18 118/87     Immunization History  Administered Date(s) Administered  . Influenza Split 07/13/2011, 09/29/2012  . Influenza,inj,Quad PF,6+ Mos 09/13/2014, 08/05/2015, 09/27/2017, 07/29/2018  . Influenza-Unspecified 08/24/2016  . Tdap 10/03/2011    Health Maintenance  Topic Date Due  . HIV Screening  08/20/1997  . TETANUS/TDAP  10/02/2021  . INFLUENZA VACCINE  Completed    Lab Results  Component Value Date   WBC 7.5 09/30/2018   HGB 16.6 09/30/2018   HCT 48.6 09/30/2018   PLT 291.0 09/30/2018   GLUCOSE 93 09/30/2018   CHOL 163 09/30/2018   TRIG 62.0 09/30/2018   HDL 59.90 09/30/2018   LDLCALC 91 09/30/2018   ALT 18 09/30/2018   AST 17 09/30/2018   NA 137 09/30/2018   K 4.7 09/30/2018   CL 98 09/30/2018   CREATININE 1.01 09/30/2018   BUN 10 09/30/2018   CO2 33 (H) 09/30/2018   TSH 1.82 09/30/2018    Lab Results  Component Value Date   TSH 1.82 09/30/2018   Lab Results  Component Value Date   WBC 7.5 09/30/2018   HGB 16.6 09/30/2018   HCT 48.6 09/30/2018   MCV 87.2 09/30/2018   PLT 291.0 09/30/2018   Lab Results  Component Value Date   NA 137 09/30/2018   K 4.7 09/30/2018   CO2 33 (H) 09/30/2018   GLUCOSE 93 09/30/2018   BUN 10 09/30/2018   CREATININE 1.01  09/30/2018   BILITOT 0.6 09/30/2018   ALKPHOS 73 09/30/2018   AST 17 09/30/2018   ALT 18 09/30/2018   PROT 7.3 09/30/2018   ALBUMIN 5.1 09/30/2018   CALCIUM 10.1 09/30/2018   GFR 88.78 09/30/2018   Lab Results  Component Value Date   CHOL 163 09/30/2018   Lab Results  Component Value Date   HDL 59.90 09/30/2018   Lab Results  Component Value Date   LDLCALC 91 09/30/2018   Lab Results  Component Value Date   TRIG 62.0 09/30/2018   Lab Results  Component Value Date   CHOLHDL 3 09/30/2018   No results found for: HGBA1C       Assessment & Plan:   Tobacco Abuse: pt has quit smoking 2 weeks ago. He is very motivated and doing well.   Tachycardia: Tachycardia increased since quitting cigarettes and drinking lots of coffee. Encouraged pt to hydrate well and mix his beans with decaf beans to cut back on caffeine.   Constipation: Constipation since quitting smoking. Encouraged increased hydration, addition of benefiber to diet, and continued miralax as needed. Explained that this should continue to get better the longer he is off of cigarettes.  Anxiety/Bipolar: Increased anxiety since quitting cigarettes and drinking coffee regularly. Overall, no acute changes, he is doing well. Encouraged pt that this is common after quitting smoking, and it should  get better the longer he is off of them.  ADHD: Also increased since quitting smoking. Changing prescription from adderall 30mg  BID to 20mg  TID. Encouraged pt this is common after quitting smoking, and it should get better the longer he is off of them.  I have discontinued Jesse KielMatthew A. Feggins "Matt"'s amphetamine-dextroamphetamine and amphetamine-dextroamphetamine. I am also having him maintain his multivitamin, vitamin B-12, cholecalciferol, fish oil-omega-3 fatty acids, vitamin C, fluticasone, clindamycin-benzoyl peroxide, nicotine, citalopram, and ALPRAZolam.  No orders of the defined types were placed in this encounter.   Laurier NancyAnna  N Mandi Mattioli, Student-PA

## 2018-12-05 MED FILL — ALPRAZolam ER 3 MG TB24: 3 | 30 days supply | Qty: 30 | Fill #1

## 2018-12-07 DIAGNOSIS — K5900 Constipation, unspecified: Secondary | ICD-10-CM | POA: Insufficient documentation

## 2018-12-07 NOTE — Assessment & Plan Note (Signed)
Has been drinking more coffee since stopping cigarettes. Encouraged to cut down on caffeine and let us know if worsens.

## 2018-12-07 NOTE — Assessment & Plan Note (Signed)
Encouraged increased hydration and fiber in diet. Daily probiotics. If bowels not moving can use MOM 2 tbls po in 4 oz of warm prune juice by mouth every 2-3 days. If no results then repeat in 4 hours with  Dulcolax suppository pr, may repeat again in 4 more hours as needed. Seek care if symptoms worsen. Consider daily Miralax and/or Dulcolax if symptoms persist.  

## 2018-12-07 NOTE — Progress Notes (Signed)
Subjective:    Patient ID: Jesse Jones, male    DOB: Apr 22, 1981, 38 y.o.   MRN: [id number]  No chief complaint on file.   HPI Patient is in today for follow-up.  He notes that he has been off of cigarettes for a couple of weeks but as a result has been drinking more caffeine and notes an increase in anxiety and jitteriness.  He does acknowledge his mood is improving and he has less depression but he does still note some anhedonia.  No recent febrile illness or acute hospitalization.  He is interested in changing his dosing of Adderall to see if a lower dose would help with his symptoms.  He is noting some constipations in quitting smoking no bloody or tarry stool but has to strain more. Denies CP/palp/SOB/HA/congestion/fevers or GU c/o. Taking meds as prescribed  Past Medical History:  Diagnosis Date  . ADD (attention deficit disorder)   . ADHD (attention deficit hyperactivity disorder) 04/02/2011  . 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 Anxiety   . 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 Concussion    X 6- 7  . 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 Depression   . ED (erectile dysfunction) 05/12/2012  . Hyperlipidemia, mild 04/18/2015  . Insomnia   . 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 Outbursts of anger   . Preventative health care 10/03/2011  . Tobacco abuse 04/28/2011    Past Surgical History:  Procedure Laterality Date  . NASAL SEPTUM SURGERY Bilateral    Dr Jenne Pane 2017  . punctured lung    . toe surgeries  during childhood    for ingrown toenails    Family History  Problem Relation Age of Onset  . Depression Mother   . Anxiety disorder Mother   . Depression Brother   . Diabetes Brother        type 1  . Cancer Paternal Grandmother        lung/ didn't smoke  . Cancer Maternal Uncle 41       colon cancer    Social History   Socioeconomic History  . Marital status: Single    Spouse name: Not on file  . Number of children: Not on file  . Years of education:  Not on file  . Highest education level: Not on file  Occupational History  . Not on file  Social Needs  . Financial resource strain: Not on file  . Food insecurity:    Worry: Not on file    Inability: Not on file  . Transportation needs:    Medical: Not on file    Non-medical: Not on file  Tobacco Use  . Smoking status: Current Every Day Smoker    Packs/day: 0.30    Years: 20.00    Pack years: 6.00    Types: Cigarettes  . Smokeless tobacco: Never Used  Substance and Sexual Activity  . Alcohol use: Yes    Alcohol/week: 36.0 standard drinks    Types: 36 Cans of beer per week    Comment: sober x32yr. No alcohol.  . Drug use: Yes    Types: Marijuana, Other-see comments    Comment: pt stopped 4 year ago  . Sexual activity: Yes    Partners: Female    Comment: lives alone and eating well. exercising  Lifestyle  . Physical activity:    Days per week: Not on file    Minutes per session: Not on file  . Stress: Not on file  Relationships  . Social connections:    Talks on phone: Not on file    Gets together: Not on file    Attends religious service: Not on file    Active member of club or organization: Not on file    Attends meetings of clubs or organizations: Not on file    Relationship status: Not on file  . Intimate partner violence:    Fear of current or ex partner: Not on file    Emotionally abused: Not on file    Physically abused: Not on file    Forced sexual activity: Not on file  Other Topics Concern  . Not on file  Social History Narrative  . Not on file    Outpatient Medications Prior to Visit  Medication Sig Dispense Refill  . ALPRAZolam (XANAX XR) 3 MG 24 hr tablet Take 1 tablet (3 mg total) by mouth every morning. 30 tablet 1  . cholecalciferol (VITAMIN D) 1000 UNITS tablet Take 1,000 Units by mouth every morning.    . clindamycin-benzoyl peroxide (BENZACLIN) gel Apply topically 2 (two) times daily. 25 g 2  . fish oil-omega-3 fatty acids 1000 MG capsule  Take 2 g by mouth daily.    . fluticasone (FLONASE) 50 MCG/ACT nasal spray Place 2 sprays into both nostrils daily as needed for allergies or rhinitis. 16 g 6  . Multiple Vitamin (MULTIVITAMIN) tablet Take 1 tablet by mouth every morning.     . nicotine (NICODERM CQ) 7 mg/24hr patch Place 1 patch (7 mg total) onto the skin daily. 28 patch 1  . vitamin B-12 (CYANOCOBALAMIN) 1000 MCG tablet Take 1,000 mcg by mouth every morning.    . vitamin C (ASCORBIC ACID) 500 MG tablet Take 500 mg by mouth daily.    Marland Kitchen. amphetamine-dextroamphetamine (ADDERALL) 30 MG tablet Take 1 tablet by mouth 2 (two) times daily. January 2020 60 tablet 0  . amphetamine-dextroamphetamine (ADDERALL) 30 MG tablet Take 1 tablet by mouth 2 (two) times daily. December 2019 60 tablet 0  . citalopram (CELEXA) 20 MG tablet Take 1 tablet (20 mg total) by mouth 2 (two) times daily. 180 tablet 1   No facility-administered medications prior to visit.     Allergies  Allergen Reactions  . Haloperidol Decanoate   . Hydroxyzine Anxiety    EXCESSIVE ANXIOUNESS  . Other Other (See Comments)    Reverse effect, super hyper  . Penicillins Hives  . Valium     Review of Systems  Constitutional: Negative for fever and malaise/fatigue.  HENT: Negative for congestion.   Eyes: Negative for blurred vision.  Respiratory: Negative for shortness of breath.   Cardiovascular: Negative for chest pain, palpitations and leg swelling.  Gastrointestinal: Positive for constipation. Negative for abdominal pain, blood in stool and nausea.  Genitourinary: Negative for dysuria and frequency.  Musculoskeletal: Negative for falls.  Skin: Negative for rash.  Neurological: Negative for dizziness, loss of consciousness and headaches.  Endo/Heme/Allergies: Negative for environmental allergies.  Psychiatric/Behavioral: Negative for depression. The patient is nervous/anxious.        Objective:    Physical Exam Vitals signs and nursing note reviewed.    Constitutional:      General: He is not in acute distress.    Appearance: He is well-developed.  HENT:     Head: Normocephalic and atraumatic.     Nose: Nose normal.  Eyes:     General:        Right eye: No discharge.  Left eye: No discharge.  Neck:     Musculoskeletal: Normal range of motion and neck supple.  Cardiovascular:     Rate and Rhythm: Normal rate and regular rhythm.     Heart sounds: No murmur.  Pulmonary:     Effort: Pulmonary effort is normal.     Breath sounds: Normal breath sounds.  Abdominal:     General: Bowel sounds are normal.     Palpations: Abdomen is soft.     Tenderness: There is no abdominal tenderness.  Skin:    General: Skin is warm and dry.  Neurological:     Mental Status: He is alert and oriented to person, place, and time.     BP 108/62 (BP Location: Left Arm, Patient Position: Sitting, Cuff Size: Normal)   Pulse 86   Temp 98 F (36.7 C) (Oral)   Resp 18   Ht 6\' 2"  (1.88 m)   Wt 165 lb 12.8 oz (75.2 kg)   SpO2 99%   BMI 21.29 kg/m  Wt Readings from Last 3 Encounters:  12/01/18 165 lb 12.8 oz (75.2 kg)  09/30/18 163 lb 9.6 oz (74.2 kg)  09/15/18 170 lb (77.1 kg)     Lab Results  Component Value Date   WBC 7.5 09/30/2018   HGB 16.6 09/30/2018   HCT 48.6 09/30/2018   PLT 291.0 09/30/2018   GLUCOSE 93 09/30/2018   CHOL 163 09/30/2018   TRIG 62.0 09/30/2018   HDL 59.90 09/30/2018   LDLCALC 91 09/30/2018   ALT 18 09/30/2018   AST 17 09/30/2018   NA 137 09/30/2018   K 4.7 09/30/2018   CL 98 09/30/2018   CREATININE 1.01 09/30/2018   BUN 10 09/30/2018   CO2 33 (H) 09/30/2018   TSH 1.82 09/30/2018    Lab Results  Component Value Date   TSH 1.82 09/30/2018   Lab Results  Component Value Date   WBC 7.5 09/30/2018   HGB 16.6 09/30/2018   HCT 48.6 09/30/2018   MCV 87.2 09/30/2018   PLT 291.0 09/30/2018   Lab Results  Component Value Date   NA 137 09/30/2018   K 4.7 09/30/2018   CO2 33 (H) 09/30/2018    GLUCOSE 93 09/30/2018   BUN 10 09/30/2018   CREATININE 1.01 09/30/2018   BILITOT 0.6 09/30/2018   ALKPHOS 73 09/30/2018   AST 17 09/30/2018   ALT 18 09/30/2018   PROT 7.3 09/30/2018   ALBUMIN 5.1 09/30/2018   CALCIUM 10.1 09/30/2018   GFR 88.78 09/30/2018   Lab Results  Component Value Date   CHOL 163 09/30/2018   Lab Results  Component Value Date   HDL 59.90 09/30/2018   Lab Results  Component Value Date   LDLCALC 91 09/30/2018   Lab Results  Component Value Date   TRIG 62.0 09/30/2018   Lab Results  Component Value Date   CHOLHDL 3 09/30/2018   No results found for: HGBA1C     Assessment & Plan:   Problem List Items Addressed This Visit    ADHD (attention deficit hyperactivity disorder)    Is requesting a switch from Adderrall to 30 mg to 20 mg tid and recheck in 8 weeks.       Anxiety    Has been drinking more coffee since stopping cigarettes. Encouraged to cut down on caffeine and let us know if worsens.       Relevant Medications   citalopram (CELEXA) 20 MG tablet   Tachycardia    Improved  on recheck. Is very anxious still cut down on caffeine and monitor      Constipation    Encouraged increased hydration and fiber in diet. Daily probiotics. If bowels not moving can use MOM 2 tbls po in 4 oz of warm prune juice by mouth every 2-3 days. If no results then repeat in 4 hours with  Dulcolax suppository pr, may repeat again in 4 more hours as needed. Seek care if symptoms worsen. Consider daily Miralax and/or Dulcolax if symptoms persist.          I have discontinued Jesse KielMatthew A. Medlen "Matt"'s amphetamine-dextroamphetamine and amphetamine-dextroamphetamine. I am also having him start on amphetamine-dextroamphetamine, amphetamine-dextroamphetamine, and amphetamine-dextroamphetamine. Additionally, I am having him maintain his multivitamin, vitamin B-12, cholecalciferol, fish oil-omega-3 fatty acids, vitamin C, fluticasone, clindamycin-benzoyl peroxide,  nicotine, ALPRAZolam, and citalopram.  Meds ordered this encounter  Medications  . citalopram (CELEXA) 20 MG tablet    Sig: Take 1 tablet (20 mg total) by mouth 2 (two) times daily.    Dispense:  180 tablet    Refill:  1  . amphetamine-dextroamphetamine (ADDERALL) 20 MG tablet    Sig: Take 1 tablet (20 mg total) by mouth 3 (three) times daily. April 2020    Dispense:  90 tablet    Refill:  0  . amphetamine-dextroamphetamine (ADDERALL) 20 MG tablet    Sig: Take 1 tablet (20 mg total) by mouth 3 (three) times daily. March 2020    Dispense:  90 tablet    Refill:  0  . amphetamine-dextroamphetamine (ADDERALL) 20 MG tablet    Sig: Take 1 tablet (20 mg total) by mouth 3 (three) times daily. February 2020    Dispense:  90 tablet    Refill:  0     Danise EdgeStacey Blyth, MD

## 2018-12-25 ENCOUNTER — Other Ambulatory Visit: Payer: Self-pay | Admitting: Family Medicine

## 2018-12-25 MED ORDER — AMPHETAMINE-DEXTROAMPHETAMINE 20 MG PO TABS: 20.0000 mg | ORAL_TABLET | Freq: Three times a day (TID) | ORAL | 0 refills | Status: DC

## 2018-12-29 ENCOUNTER — Telehealth: Payer: Self-pay | Admitting: Family Medicine

## 2018-12-29 NOTE — Telephone Encounter (Signed)
See note re: out of Adderall, and unable to fill prior to 12/31/18.  Is requesting to get it filled sooner.

## 2018-12-29 NOTE — Telephone Encounter (Signed)
Ive called the pharmacy 3 times and was unable to connect to them, phone hung up.

## 2018-12-29 NOTE — Telephone Encounter (Signed)
Copied from CRM [id number]. Topic: Quick Communication - Rx Refill/Question >> Dec 29, 2018  9:10 AM Lynne Logan D wrote: Medication: amphetamine-dextroamphetamine (ADDERALL) 20 MG tablet / Pt stated he has been without his adderral for 2 day and is having a hard time. He stated the pharmacy would not fill it until 12/31/18 possibly due to the leap year. Rx sent in on 12/25/18. He would like to know if there is any way to have it filled today or tomorrow. Please advise.  Has the patient contacted their pharmacy? Yes.   (Agent: If no, request that the patient contact the pharmacy for the refill.) (Agent: If yes, when and what did the pharmacy advise?)  Preferred Pharmacy (with phone number or street name): [id number] - [place],  - [address] AT [address] [facility] [address] [phone] (Phone) [phone] (Fax)    Agent: Please be advised that RX refills may take up to 3 business days. We ask that you follow-up with your pharmacy.

## 2018-12-31 ENCOUNTER — Other Ambulatory Visit: Payer: Self-pay | Admitting: Family Medicine

## 2018-12-31 NOTE — Telephone Encounter (Signed)
Copied from CRM [id number]. Topic: Quick Communication - Rx Refill/Question >> Dec 31, 2018  6:24 PM Mcneil, Ja-Kwan wrote: Medication: ALPRAZolam (XANAX XR) 3 MG 24 hr tablet  Has the patient contacted their pharmacy? yes   Preferred Pharmacy (with phone number or street name): [facility] - [place], [place] - [address] [phone] (Phone)  [phone] (Fax)  Agent: Please be advised that RX refills may take up to 3 business days. We ask that you follow-up with your pharmacy.

## 2019-01-01 MED ORDER — ALPRAZOLAM ER 3 MG PO TB24: 3.0000 mg | ORAL_TABLET | Freq: Every morning | ORAL | 1 refills | Status: DC

## 2019-01-02 MED FILL — ALPRAZolam ER 3 MG TB24: 3 | 30 days supply | Qty: 30 | Fill #0

## 2019-01-16 ENCOUNTER — Other Ambulatory Visit: Payer: Self-pay | Admitting: Family Medicine

## 2019-01-23 ENCOUNTER — Ambulatory Visit: Payer: Self-pay | Admitting: Family Medicine

## 2019-01-23 ENCOUNTER — Ambulatory Visit (INDEPENDENT_AMBULATORY_CARE_PROVIDER_SITE_OTHER): Payer: Medicare Other | Admitting: Family Medicine

## 2019-01-23 ENCOUNTER — Other Ambulatory Visit: Payer: Self-pay

## 2019-01-23 DIAGNOSIS — F419 Anxiety disorder, unspecified: Secondary | ICD-10-CM | POA: Diagnosis not present

## 2019-01-23 DIAGNOSIS — F17219 Nicotine dependence, cigarettes, with unspecified nicotine-induced disorders: Secondary | ICD-10-CM

## 2019-01-23 DIAGNOSIS — F908 Attention-deficit hyperactivity disorder, other type: Secondary | ICD-10-CM | POA: Diagnosis not present

## 2019-01-23 MED ORDER — AMPHETAMINE-DEXTROAMPHETAMINE 30 MG PO TABS: 30.0000 mg | ORAL_TABLET | Freq: Two times a day (BID) | ORAL | 0 refills | Status: DC

## 2019-01-23 NOTE — Assessment & Plan Note (Signed)
His Adderall 20 mg tabs not working ideally. Has had the 30 mg dose in past will change to 30 mg bid and we will reassess next week.

## 2019-01-23 NOTE — Assessment & Plan Note (Signed)
Has quit smoking used Nicotine patches at 14 mg x 8 weeks and has decreased to 7 mg for a couple weeks and despite some increased anxiety he is doing well. He does not feel he will go back

## 2019-01-23 NOTE — Progress Notes (Signed)
Virtual Visit via Video Note  I connected with [name] on 01/23/19 at 10:30 AM EDT by a video enabled telemedicine application and verified that I am speaking with the correct person using two identifiers.   I discussed the limitations of evaluation and management by telemedicine and the availability of in person appointments. The patient expressed understanding and agreed to proceed. Crissie Sickles, CMA helped room patient and Patient's father, [name] was also on the call    Subjective:    Patient ID: Jesse Jones, male    DOB: September 08, 1981, 38 y.o.   MRN: [id number]  No chief complaint on file.   HPI Patient is in today for follow up and accompanied by his father on a video visit due to Covid 19. He feels well.  No recent febrile illness or acute hospitalizations. He has noted increased anxiety and the decreased efficacy of his Adderall since he quit smoking a couple of weeks ago. He started on Nicotine patches at 14 mg x 2 months and is now using the 7 mg patch for past couple of weeks and he feels his anxiety is slowly improving again. Denies CP/palp/SOB/HA/congestion/fevers/GI or GU c/o. Taking meds as prescribed  Past Medical History:  Diagnosis Date  . ADD (attention deficit disorder)   . ADHD (attention deficit hyperactivity disorder) 04/02/2011  . 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 Anxiety   . 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 Concussion    X 6- 7  . 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 Depression   . ED (erectile dysfunction) 05/12/2012  . Hyperlipidemia, mild 04/18/2015  . Insomnia   . 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 Outbursts of anger   . Preventative health care 10/03/2011  . Tobacco abuse 04/28/2011    Past Surgical History:  Procedure Laterality Date  . NASAL SEPTUM SURGERY Bilateral    Dr Jenne Pane 2017  . punctured lung    . toe surgeries  during childhood    for ingrown toenails    Family History  Problem Relation Age of Onset   . Depression Mother   . Anxiety disorder Mother   . Depression Brother   . Diabetes Brother        type 1  . Cancer Paternal Grandmother        lung/ didn't smoke  . Cancer Maternal Uncle 45       colon cancer    Social History   Socioeconomic History  . Marital status: Single    Spouse name: Not on file  . Number of children: Not on file  . Years of education: Not on file  . Highest education level: Not on file  Occupational History  . Not on file  Social Needs  . Financial resource strain: Not on file  . Food insecurity:    Worry: Not on file    Inability: Not on file  . Transportation needs:    Medical: Not on file    Non-medical: Not on file  Tobacco Use  . Smoking status: Current Every Day Smoker    Packs/day: 0.30    Years: 20.00    Pack years: 6.00    Types: Cigarettes  . Smokeless tobacco: Never Used  Substance and Sexual Activity  . Alcohol use: Yes    Alcohol/week: 36.0 standard drinks    Types: 36 Cans of beer per week    Comment: sober x4yr. No alcohol.  . Drug use: Yes    Types: Marijuana, Other-see  comments    Comment: pt stopped 4 year ago  . Sexual activity: Yes    Partners: Female    Comment: lives alone and eating well. exercising  Lifestyle  . Physical activity:    Days per week: Not on file    Minutes per session: Not on file  . Stress: Not on file  Relationships  . Social connections:    Talks on phone: Not on file    Gets together: Not on file    Attends religious service: Not on file    Active member of club or organization: Not on file    Attends meetings of clubs or organizations: Not on file    Relationship status: Not on file  . Intimate partner violence:    Fear of current or ex partner: Not on file    Emotionally abused: Not on file    Physically abused: Not on file    Forced sexual activity: Not on file  Other Topics Concern  . Not on file  Social History Narrative  . Not on file    Outpatient Medications Prior to  Visit  Medication Sig Dispense Refill  . ALPRAZolam (XANAX XR) 3 MG 24 hr tablet Take 1 tablet (3 mg total) by mouth every morning. 30 tablet 1  . cholecalciferol (VITAMIN D) 1000 UNITS tablet Take 1,000 Units by mouth every morning.    . citalopram (CELEXA) 20 MG tablet Take 1 tablet (20 mg total) by mouth 2 (two) times daily. 180 tablet 1  . clindamycin-benzoyl peroxide (BENZACLIN) gel Apply topically 2 (two) times daily. 25 g 2  . fish oil-omega-3 fatty acids 1000 MG capsule Take 2 g by mouth daily.    . fluticasone (FLONASE) 50 MCG/ACT nasal spray Place 2 sprays into both nostrils daily as needed for allergies or rhinitis. 16 g 6  . Multiple Vitamin (MULTIVITAMIN) tablet Take 1 tablet by mouth every morning.     . nicotine (NICODERM CQ) 7 mg/24hr patch Place 1 patch (7 mg total) onto the skin daily. 28 patch 1  . vitamin B-12 (CYANOCOBALAMIN) 1000 MCG tablet Take 1,000 mcg by mouth every morning.    . vitamin C (ASCORBIC ACID) 500 MG tablet Take 500 mg by mouth daily.    Marland Kitchen amphetamine-dextroamphetamine (ADDERALL) 20 MG tablet Take 1 tablet (20 mg total) by mouth 3 (three) times daily. April 2020 90 tablet 0  . amphetamine-dextroamphetamine (ADDERALL) 20 MG tablet Take 1 tablet (20 mg total) by mouth 3 (three) times daily. March 2020 90 tablet 0  . amphetamine-dextroamphetamine (ADDERALL) 20 MG tablet Take 1 tablet (20 mg total) by mouth 3 (three) times daily. February 2020 90 tablet 0   No facility-administered medications prior to visit.     Allergies  Allergen Reactions  . Haloperidol Decanoate   . Hydroxyzine Anxiety    EXCESSIVE ANXIOUNESS  . Other Other (See Comments)    Reverse effect, super hyper  . Penicillins Hives  . Valium     Review of Systems  Constitutional: Negative for fever and malaise/fatigue.  HENT: Negative for congestion.   Eyes: Negative for blurred vision.  Respiratory: Negative for shortness of breath.   Cardiovascular: Negative for chest pain,  palpitations and leg swelling.  Gastrointestinal: Negative for abdominal pain, blood in stool and nausea.  Genitourinary: Negative for dysuria and frequency.  Musculoskeletal: Negative for falls.  Skin: Negative for rash.  Neurological: Negative for dizziness, loss of consciousness and headaches.  Endo/Heme/Allergies: Negative for environmental allergies.  Psychiatric/Behavioral:  Negative for depression. The patient is nervous/anxious.        Objective:    Physical Exam Constitutional:      General: He is not in acute distress.    Appearance: Normal appearance. He is normal weight. He is not ill-appearing.  Neurological:     Mental Status: He is alert and oriented to person, place, and time.     Wt 169 lb (76.7 kg)   BMI 21.70 kg/m  Wt Readings from Last 3 Encounters:  01/23/19 169 lb (76.7 kg)  12/01/18 165 lb 12.8 oz (75.2 kg)  09/30/18 163 lb 9.6 oz (74.2 kg)    Diabetic Foot Exam - Simple   No data filed     Lab Results  Component Value Date   WBC 7.5 09/30/2018   HGB 16.6 09/30/2018   HCT 48.6 09/30/2018   PLT 291.0 09/30/2018   GLUCOSE 93 09/30/2018   CHOL 163 09/30/2018   TRIG 62.0 09/30/2018   HDL 59.90 09/30/2018   LDLCALC 91 09/30/2018   ALT 18 09/30/2018   AST 17 09/30/2018   NA 137 09/30/2018   K 4.7 09/30/2018   CL 98 09/30/2018   CREATININE 1.01 09/30/2018   BUN 10 09/30/2018   CO2 33 (H) 09/30/2018   TSH 1.82 09/30/2018    Lab Results  Component Value Date   TSH 1.82 09/30/2018   Lab Results  Component Value Date   WBC 7.5 09/30/2018   HGB 16.6 09/30/2018   HCT 48.6 09/30/2018   MCV 87.2 09/30/2018   PLT 291.0 09/30/2018   Lab Results  Component Value Date   NA 137 09/30/2018   K 4.7 09/30/2018   CO2 33 (H) 09/30/2018   GLUCOSE 93 09/30/2018   BUN 10 09/30/2018   CREATININE 1.01 09/30/2018   BILITOT 0.6 09/30/2018   ALKPHOS 73 09/30/2018   AST 17 09/30/2018   ALT 18 09/30/2018   PROT 7.3 09/30/2018   ALBUMIN 5.1  09/30/2018   CALCIUM 10.1 09/30/2018   GFR 88.78 09/30/2018   Lab Results  Component Value Date   CHOL 163 09/30/2018   Lab Results  Component Value Date   HDL 59.90 09/30/2018   Lab Results  Component Value Date   LDLCALC 91 09/30/2018   Lab Results  Component Value Date   TRIG 62.0 09/30/2018   Lab Results  Component Value Date   CHOLHDL 3 09/30/2018   No results found for: HGBA1C     Assessment & Plan:   Problem List Items Addressed This Visit    ADHD (attention deficit hyperactivity disorder)    His Adderall 20 mg tabs not working ideally. Has had the 30 mg dose in past will change to 30 mg bid and we will reassess next week.       Anxiety    Is struggling some but he is noting increased anxiety with smoking cessation but he feels he is managing OK and improving. No changes.       Nicotine dependence    Has quit smoking used Nicotine patches at 14 mg x 8 weeks and has decreased to 7 mg for a couple weeks and despite some increased anxiety he is doing well. He does not feel he will go back         I have discontinued [name] "Matt"'s amphetamine-dextroamphetamine, amphetamine-dextroamphetamine, and amphetamine-dextroamphetamine. I am also having him start on amphetamine-dextroamphetamine. Additionally, I am having him maintain his multivitamin, vitamin B-12, cholecalciferol, fish oil-omega-3 fatty acids,  vitamin C, fluticasone, clindamycin-benzoyl peroxide, nicotine, citalopram, and ALPRAZolam.  Meds ordered this encounter  Medications  . amphetamine-dextroamphetamine (ADDERALL) 30 MG tablet    Sig: Take 1 tablet by mouth 2 (two) times daily.    Dispense:  60 tablet    Refill:  0     Danise Edge, MD  I discussed the assessment and treatment plan with the patient. The patient was provided an opportunity to ask questions and all were answered. The patient agreed with the plan and demonstrated an understanding of the instructions.   The patient  was advised to call back or seek an in-person evaluation if the symptoms worsen or if the condition fails to improve as anticipated.  I provided 20 minutes of non-face-to-face time during this encounter.   Danise Edge, MD

## 2019-01-23 NOTE — Assessment & Plan Note (Signed)
Is struggling some but he is noting increased anxiety with smoking cessation but he feels he is managing OK and improving. No changes.

## 2019-01-30 ENCOUNTER — Other Ambulatory Visit: Payer: Self-pay

## 2019-01-30 ENCOUNTER — Ambulatory Visit: Payer: Medicare Other | Admitting: Family Medicine

## 2019-01-31 MED FILL — ALPRAZOLAM XR 3 MG TB24: 3 | 30 days supply | Qty: 30 | Fill #1

## 2019-02-03 ENCOUNTER — Other Ambulatory Visit: Payer: Self-pay

## 2019-02-03 ENCOUNTER — Ambulatory Visit (INDEPENDENT_AMBULATORY_CARE_PROVIDER_SITE_OTHER): Payer: Medicare Other | Admitting: Family Medicine

## 2019-02-03 DIAGNOSIS — L0100 Impetigo, unspecified: Secondary | ICD-10-CM | POA: Insufficient documentation

## 2019-02-03 DIAGNOSIS — F3160 Bipolar disorder, current episode mixed, unspecified: Secondary | ICD-10-CM

## 2019-02-03 DIAGNOSIS — F329 Major depressive disorder, single episode, unspecified: Secondary | ICD-10-CM | POA: Diagnosis not present

## 2019-02-03 DIAGNOSIS — F17219 Nicotine dependence, cigarettes, with unspecified nicotine-induced disorders: Secondary | ICD-10-CM | POA: Diagnosis not present

## 2019-02-03 DIAGNOSIS — F419 Anxiety disorder, unspecified: Secondary | ICD-10-CM | POA: Diagnosis not present

## 2019-02-03 DIAGNOSIS — F908 Attention-deficit hyperactivity disorder, other type: Secondary | ICD-10-CM

## 2019-02-03 DIAGNOSIS — F1011 Alcohol abuse, in remission: Secondary | ICD-10-CM | POA: Diagnosis not present

## 2019-02-03 MED ORDER — MUPIROCIN 2 % EX OINT: 1.0000 "application " | TOPICAL_OINTMENT | Freq: Two times a day (BID) | CUTANEOUS | 0 refills | Status: DC

## 2019-02-03 NOTE — Assessment & Plan Note (Signed)
Behind left ear with underlying cyst. Try Mupirocin ointment bid and report any concerns.

## 2019-02-03 NOTE — Assessment & Plan Note (Signed)
No family history but patient was questionably diagnosed with bipolar many years ago. He has done well over the years on an SSRI and benzo but latelyw ith the covid scare and quitting smoking he is more manic with racing thoughts, some paranoia and agitation. He denies suicidal or homicidal ideation. He agrees to restart counseling but declines a course of Zyprexa today, will have a follow up visit next week to reevaluate and he can call sooner if he decides to try meds. His father is present during the visit and will monitor patient.

## 2019-02-03 NOTE — Progress Notes (Signed)
Virtual Visit via Video Note  I connected with Jesse Jones on 02/03/19 at  9:00 AM EDT by a video enabled telemedicine application and verified that I am speaking with the correct person using two identifiers.   I discussed the limitations of evaluation and management by telemedicine and the availability of in person appointments. The patient expressed understanding and agreed to proceed. Crissie SicklesPrincess Carter, CMA able to get patient on the video call    Subjective:    Patient ID: Jesse Jones, male    DOB: 03-30-1981, 38 y.o.   MRN: [id number]  No chief complaint on file.   HPI Patient is in today for evaluation of irritability and a cyston his left ear posteriorly. No pain but some itching noted. .No family history but patient was questionably diagnosed with bipolar many years ago. He has done well over the years on an SSRI and benzo but latelyw ith the covid scare and quitting smoking he is more manic with racing thoughts, some paranoia and agitation. He denies suicidal or homicidal ideation.  Past Medical History:  Diagnosis Date  . ADD (attention deficit disorder)   . ADHD (attention deficit hyperactivity disorder) 04/02/2011  . 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 Anxiety   . 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 Concussion    X 6- 7  . 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 Depression   . ED (erectile dysfunction) 05/12/2012  . Hyperlipidemia, mild 04/18/2015  . Insomnia   . 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 Outbursts of anger   . Preventative health care 10/03/2011  . Tobacco abuse 04/28/2011    Past Surgical History:  Procedure Laterality Date  . NASAL SEPTUM SURGERY Bilateral    Dr Jenne PaneBates 2017  . punctured lung    . toe surgeries  during childhood    for ingrown toenails    Family History  Problem Relation Age of Onset  . Depression Mother   . Anxiety disorder Mother   . Depression Brother   . Diabetes Brother        type 1  . Cancer Paternal  Grandmother        lung/ didn't smoke  . Cancer Maternal Uncle 3063       colon cancer    Social History   Socioeconomic History  . Marital status: Single    Spouse name: Not on file  . Number of children: Not on file  . Years of education: Not on file  . Highest education level: Not on file  Occupational History  . Not on file  Social Needs  . Financial resource strain: Not on file  . Food insecurity:    Worry: Not on file    Inability: Not on file  . Transportation needs:    Medical: Not on file    Non-medical: Not on file  Tobacco Use  . Smoking status: Current Every Day Smoker    Packs/day: 0.30    Years: 20.00    Pack years: 6.00    Types: Cigarettes  . Smokeless tobacco: Never Used  Substance and Sexual Activity  . Alcohol use: Yes    Alcohol/week: 36.0 standard drinks    Types: 36 Cans of beer per week    Comment: sober x8258yr. No alcohol.  . Drug use: Yes    Types: Marijuana, Other-see comments    Comment: pt stopped 4 year ago  . Sexual activity: Yes    Partners: Female    Comment: lives alone  and eating well. exercising  Lifestyle  . Physical activity:    Days per week: Not on file    Minutes per session: Not on file  . Stress: Not on file  Relationships  . Social connections:    Talks on phone: Not on file    Gets together: Not on file    Attends religious service: Not on file    Active member of club or organization: Not on file    Attends meetings of clubs or organizations: Not on file    Relationship status: Not on file  . Intimate partner violence:    Fear of current or ex partner: Not on file    Emotionally abused: Not on file    Physically abused: Not on file    Forced sexual activity: Not on file  Other Topics Concern  . Not on file  Social History Narrative  . Not on file    Outpatient Medications Prior to Visit  Medication Sig Dispense Refill  . ALPRAZolam (XANAX XR) 3 MG 24 hr tablet Take 1 tablet (3 mg total) by mouth every  morning. 30 tablet 1  . amphetamine-dextroamphetamine (ADDERALL) 30 MG tablet Take 1 tablet by mouth 2 (two) times daily. 60 tablet 0  . cholecalciferol (VITAMIN D) 1000 UNITS tablet Take 1,000 Units by mouth every morning.    . citalopram (CELEXA) 20 MG tablet Take 1 tablet (20 mg total) by mouth 2 (two) times daily. 180 tablet 1  . clindamycin-benzoyl peroxide (BENZACLIN) gel Apply topically 2 (two) times daily. 25 g 2  . fish oil-omega-3 fatty acids 1000 MG capsule Take 2 g by mouth daily.    . fluticasone (FLONASE) 50 MCG/ACT nasal spray Place 2 sprays into both nostrils daily as needed for allergies or rhinitis. 16 g 6  . Multiple Vitamin (MULTIVITAMIN) tablet Take 1 tablet by mouth every morning.     . nicotine (NICODERM CQ) 7 mg/24hr patch Place 1 patch (7 mg total) onto the skin daily. 28 patch 1  . vitamin B-12 (CYANOCOBALAMIN) 1000 MCG tablet Take 1,000 mcg by mouth every morning.    . vitamin C (ASCORBIC ACID) 500 MG tablet Take 500 mg by mouth daily.     No facility-administered medications prior to visit.     Allergies  Allergen Reactions  . Haloperidol Decanoate   . Hydroxyzine Anxiety    EXCESSIVE ANXIOUNESS  . Other Other (See Comments)    Reverse effect, super hyper  . Penicillins Hives  . Valium     Review of Systems  Constitutional: Negative for fever and malaise/fatigue.  HENT: Negative for congestion.   Eyes: Negative for blurred vision.  Respiratory: Negative for shortness of breath.   Cardiovascular: Negative for chest pain, palpitations and leg swelling.  Gastrointestinal: Negative for abdominal pain, blood in stool and nausea.  Genitourinary: Negative for dysuria and frequency.  Musculoskeletal: Negative for falls.  Skin: Positive for rash.  Neurological: Negative for dizziness, loss of consciousness and headaches.  Endo/Heme/Allergies: Negative for environmental allergies.  Psychiatric/Behavioral: Positive for depression. Negative for hallucinations and  substance abuse. The patient is nervous/anxious.        Objective:    Physical Exam Vitals signs and nursing note reviewed.  Constitutional:      General: He is not in acute distress.    Appearance: He is well-developed.  HENT:     Head: Normocephalic and atraumatic.     Nose: Nose normal.  Eyes:     General:  Right eye: No discharge.        Left eye: No discharge.  Neck:     Musculoskeletal: Neck supple.  Cardiovascular:     Rate and Rhythm: Normal rate.     Heart sounds: No murmur.  Pulmonary:     Effort: Pulmonary effort is normal.  Abdominal:     Palpations: Abdomen is soft.     Tenderness: There is no abdominal tenderness.  Skin:    General: Skin is warm.     Comments: Golden crusted scab behind left pinna.  Neurological:     Mental Status: He is alert and oriented to person, place, and time.  Psychiatric:        Mood and Affect: Mood normal.        Behavior: Behavior normal.     There were no vitals taken for this visit. Wt Readings from Last 3 Encounters:  01/23/19 169 lb (76.7 kg)  12/01/18 165 lb 12.8 oz (75.2 kg)  09/30/18 163 lb 9.6 oz (74.2 kg)    Diabetic Foot Exam - Simple   No data filed     Lab Results  Component Value Date   WBC 7.5 09/30/2018   HGB 16.6 09/30/2018   HCT 48.6 09/30/2018   PLT 291.0 09/30/2018   GLUCOSE 93 09/30/2018   CHOL 163 09/30/2018   TRIG 62.0 09/30/2018   HDL 59.90 09/30/2018   LDLCALC 91 09/30/2018   ALT 18 09/30/2018   AST 17 09/30/2018   NA 137 09/30/2018   K 4.7 09/30/2018   CL 98 09/30/2018   CREATININE 1.01 09/30/2018   BUN 10 09/30/2018   CO2 33 (H) 09/30/2018   TSH 1.82 09/30/2018    Lab Results  Component Value Date   TSH 1.82 09/30/2018   Lab Results  Component Value Date   WBC 7.5 09/30/2018   HGB 16.6 09/30/2018   HCT 48.6 09/30/2018   MCV 87.2 09/30/2018   PLT 291.0 09/30/2018   Lab Results  Component Value Date   NA 137 09/30/2018   K 4.7 09/30/2018   CO2 33 (H)  09/30/2018   GLUCOSE 93 09/30/2018   BUN 10 09/30/2018   CREATININE 1.01 09/30/2018   BILITOT 0.6 09/30/2018   ALKPHOS 73 09/30/2018   AST 17 09/30/2018   ALT 18 09/30/2018   PROT 7.3 09/30/2018   ALBUMIN 5.1 09/30/2018   CALCIUM 10.1 09/30/2018   GFR 88.78 09/30/2018   Lab Results  Component Value Date   CHOL 163 09/30/2018   Lab Results  Component Value Date   HDL 59.90 09/30/2018   Lab Results  Component Value Date   LDLCALC 91 09/30/2018   Lab Results  Component Value Date   TRIG 62.0 09/30/2018   Lab Results  Component Value Date   CHOLHDL 3 09/30/2018   No results found for: HGBA1C     Assessment & Plan:   Problem List Items Addressed This Visit    ADHD (attention deficit hyperactivity disorder)   Relevant Orders   Ambulatory referral to Behavioral Health   RESOLVED: Anxiety - Primary   Nicotine dependence    Continues to abstain, encouraged ongoing efforts.       Bipolar disorder, mixed (HCC)    No family history but patient was questionably diagnosed with bipolar many years ago. He has done well over the years on an SSRI and benzo but latelyw ith the covid scare and quitting smoking he is more manic with racing thoughts, some paranoia and agitation.  He denies suicidal or homicidal ideation. He agrees to restart counseling but declines a course of Zyprexa today, will have a follow up visit next week to reevaluate and he can call sooner if he decides to try meds. His father is present during the visit and will monitor patient.       Alcohol abuse, in remission   Relevant Orders   Ambulatory referral to Behavioral Health   Impetigo    Behind left ear with underlying cyst. Try Mupirocin ointment bid and report any concerns.       Relevant Medications   mupirocin ointment (BACTROBAN) 2 %    Other Visit Diagnoses    Anxiety and depression       Relevant Orders   Ambulatory referral to Behavioral Health      I am having Jesse Kiel "Matt"  start on mupirocin ointment. I am also having him maintain his multivitamin, vitamin B-12, cholecalciferol, fish oil-omega-3 fatty acids, vitamin C, fluticasone, clindamycin-benzoyl peroxide, nicotine, citalopram, ALPRAZolam, and amphetamine-dextroamphetamine.  Meds ordered this encounter  Medications  . mupirocin ointment (BACTROBAN) 2 %    Sig: Place 1 application into the nose 2 (two) times daily.    Dispense:  22 g    Refill:  0     Danise Edge, MD   I discussed the assessment and treatment plan with the patient. The patient was provided an opportunity to ask questions and all were answered. The patient agreed with the plan and demonstrated an understanding of the instructions.   The patient was advised to call back or seek an in-person evaluation if the symptoms worsen or if the condition fails to improve as anticipated.  I provided 24 minutes of non-face-to-face time during this encounter.   Danise Edge, MD

## 2019-02-03 NOTE — Assessment & Plan Note (Signed)
Continues to abstain, encouraged ongoing efforts.

## 2019-02-13 ENCOUNTER — Telehealth: Payer: Self-pay | Admitting: Family Medicine

## 2019-02-13 ENCOUNTER — Telehealth: Payer: Self-pay

## 2019-02-13 ENCOUNTER — Other Ambulatory Visit: Payer: Self-pay | Admitting: Family Medicine

## 2019-02-13 MED ORDER — AMPHETAMINE-DEXTROAMPHET ER 30 MG PO CP24: 30.0000 mg | ORAL_CAPSULE | ORAL | 0 refills | Status: DC

## 2019-02-13 NOTE — Telephone Encounter (Signed)
Copied from CRM [id number]. Topic: Quick Communication >> Feb 13, 2019  2:10 PM Jens Som A wrote: CRM for notification. See Telephone encounter for: 02/13/19. Josslyn from  Calling from [facility] [phone] - [place], [place] - [address] AT [address] [facility] [address] [phone] (Phone) [phone] (Fax)  to state the insurance not paying for amphetamine-dextroamphetamine (ADDERALL XR) 30 MG 24 hr capsule [[id number]] states there is an alternate drug wanting to verify the script and the quantity. Please advise.

## 2019-02-13 NOTE — Telephone Encounter (Signed)
Duplicate message. 

## 2019-02-13 NOTE — Telephone Encounter (Signed)
Duplicate note

## 2019-02-13 NOTE — Telephone Encounter (Signed)
Please advise 

## 2019-02-13 NOTE — Telephone Encounter (Signed)
Copied from CRM [id number]. Topic: Quick Communication - See Telephone Encounter >> Feb 13, 2019  2:10 PM Jens Som A wrote: CRM for notification. See Telephone encounter for: 02/13/19. Josslyn from  Calling from [facility] [phone] - [place], [place] - [address] AT [address] [facility] [phone] (Phone) [phone] (Fax)  to state the insurance not paying for amphetamine-dextroamphetamine (ADDERALL XR) 30 MG 24 hr capsule [[id number]] states there is an alternate drug wanting to check the script and the quanity Please advise

## 2019-02-13 NOTE — Telephone Encounter (Signed)
Copied from CRM [id number]. Topic: General - Inquiry >> Feb 12, 2019 12:50 PM Lynne Logan D wrote: Reason for CRM: Pt would like to try time release ADD medication before trying bipolar medication. He wants to do the bipolar medication as a last resort. He stated his mania has calmed down but his rush of thoughts is still there. He stated he has discussed this with Dr. Abner Greenspan and would like to have something sent to his pharmacy. Please advise. Cb#[phone]  [id number] - [place], [place] - [address] AT [facility] [address] [facility] [address] [phone] (Phone) [phone] (Fax)

## 2019-02-13 NOTE — Telephone Encounter (Signed)
Copied from CRM #[id number]. Topic: Quick Communication °>> Feb 13, 2019  2:10 PM Medley, Jennifer A wrote: °CRM for notification. See Telephone encounter for: 02/13/19. Josslyn from  Calling from [id number] - [place], [place] - [address] AT [address] [phone] (Phone) [phone] (Fax) ° °to state the insurance not paying for amphetamine-dextroamphetamine (ADDERALL XR) 30 MG 24 hr capsule [[id number]] states there is an alternate drug wanting to verify the script and the quantity. Please advise. °

## 2019-02-13 NOTE — Telephone Encounter (Signed)
Copied from CRM #[id number]. Topic: Quick Communication °>> Feb 13, 2019  2:10 PM Medley, Jennifer A wrote: °CRM for notification. See Telephone encounter for: 02/13/19. Josslyn from  Calling from [id number] - [place], [place] - [address] AT [address] [phone] (Phone) °[phone] (Fax) ° °to state the insurance not paying for amphetamine-dextroamphetamine (ADDERALL XR) 30 MG 24 hr capsule [[id number]] states there is an alternate drug wanting to verify the script and the quantity. Please advise. °

## 2019-02-13 NOTE — Telephone Encounter (Signed)
I have sent in the XR at 30 mg twice daily but make sure he has a video visit next week to see how he is doing. Thanks

## 2019-02-13 NOTE — Telephone Encounter (Signed)
Sent the XR earlier

## 2019-02-16 ENCOUNTER — Other Ambulatory Visit: Payer: Self-pay | Admitting: Family Medicine

## 2019-02-16 ENCOUNTER — Other Ambulatory Visit: Payer: Self-pay

## 2019-02-16 NOTE — Telephone Encounter (Signed)
Please change the rx at pharmacy to the short acting and make sure he has what he needs

## 2019-02-16 NOTE — Telephone Encounter (Signed)
Called patient. States he will just take the plain Adderall that he can pick up in a couple days and call in to schedule appointment later.

## 2019-02-16 NOTE — Telephone Encounter (Signed)
-----   Message from Bradd Canary, MD sent at 02/15/2019  9:00 AM EDT ----- Let him know they will not cover the XR and get him an appointment to discuss options. We can send back in the regular awaiting appt if he wants ----- Message ----- From: Lurline Hare, LPN Sent: 1/61/0960   3:37 PM EDT To: Bradd Canary, MD  Ok to order the Plain Adderall?  XR and ER not covered

## 2019-02-16 NOTE — Telephone Encounter (Signed)
Patient called back to inquire about adderrall XR and was advised that per his pharmacy, the script is not covered by insurance. Patient stated he will contact the pharmacy and she how much it costs out of pocket, but will call back if he wants to try to get a prior auth or see if Dr. Rogelia Rohrer can prescribe concerta.

## 2019-02-16 NOTE — Telephone Encounter (Signed)
pcp sent meds in

## 2019-02-17 MED ORDER — AMPHETAMINE-DEXTROAMPHETAMINE 30 MG PO TABS: 30.0000 mg | ORAL_TABLET | Freq: Two times a day (BID) | ORAL | 0 refills | Status: DC

## 2019-02-17 NOTE — Telephone Encounter (Signed)
Pharmacy states insurance will not pay for XR of the Adderall. Spoke with patient he stated it is ok he can do the 3mg  bid and that will be fine. He also states he has doing well since he has been able to get outside, he states he feels when its winter time he can not go out as much to ride his bike. He also states at this time he is doing well that he does not need a follow up visit unless you want him to do so, he also states he does not feel that therapy will work for him at this time.  Please send in rx  Please advise

## 2019-02-17 NOTE — Telephone Encounter (Signed)
Pt called to see if plain Adderrall rx had been sent in yet. Asking if it can be sent today as he is out. He would like to know if Princess can call him once rx has been sent in. Please advise. CB#[phone]

## 2019-02-17 NOTE — Addendum Note (Signed)
Addended by: Crissie Sickles A on: 02/17/2019 01:43 PM   Modules accepted: Orders

## 2019-02-17 NOTE — Telephone Encounter (Signed)
Pt calling to check status. Pt would like a call back for conformation.

## 2019-02-27 ENCOUNTER — Other Ambulatory Visit: Payer: Self-pay | Admitting: Family Medicine

## 2019-02-27 NOTE — Telephone Encounter (Signed)
Requesting:xanax Contract:yes UDS:n/a Last OV:02/03/19 Next OV:n/a Last Refill:01/01/19  #30-01rf Database:   Please advise

## 2019-03-02 MED FILL — ALPRAZOLAM XR 3 MG TB24: 3 | 30 days supply | Qty: 30 | Fill #0

## 2019-03-18 ENCOUNTER — Other Ambulatory Visit: Payer: Self-pay | Admitting: Family Medicine

## 2019-03-18 MED ORDER — AMPHETAMINE-DEXTROAMPHETAMINE 30 MG PO TABS: 30.0000 mg | ORAL_TABLET | Freq: Two times a day (BID) | ORAL | 0 refills | Status: DC

## 2019-03-18 NOTE — Telephone Encounter (Signed)
I have refill his Adderall but this had been a change so he needs a follow up visit in the next month or so.

## 2019-03-18 NOTE — Telephone Encounter (Signed)
Requesting:Adderall  Contract:yes UDS:09/15/2018 Last Visit: 02/03/2019 Next Visit:none scheduled Last Refill:02/17/2019  Please Advise

## 2019-03-30 MED FILL — ALPRAZOLAM XR 3 MG TB24: 3 | 30 days supply | Qty: 30 | Fill #1

## 2019-04-16 ENCOUNTER — Telehealth: Payer: Self-pay | Admitting: Family Medicine

## 2019-04-16 MED ORDER — AMPHETAMINE-DEXTROAMPHETAMINE 30 MG PO TABS: 30.0000 mg | ORAL_TABLET | Freq: Two times a day (BID) | ORAL | 0 refills | Status: DC

## 2019-04-16 NOTE — Telephone Encounter (Signed)
Sent!

## 2019-04-16 NOTE — Telephone Encounter (Signed)
Adderall refill. Jesse Jones  Pt.   Last OV: 02/03/2019 Last Fill: 03/18/2019 #60 and 0RF UDS: 03/27/2018 Low risk

## 2019-04-27 ENCOUNTER — Other Ambulatory Visit: Payer: Self-pay | Admitting: Family Medicine

## 2019-04-27 MED FILL — ALPRAZOLAM XR 3 MG TB24: 3 | 30 days supply | Qty: 30 | Fill #0

## 2019-04-27 NOTE — Telephone Encounter (Signed)
Requesting:alprazolam Contract:yes UDS:low risk next screen 03/17/19 Last OV:02/03/19 Next OV:n/a Last Refill:03/01/19  #30-1rf Database:   Please advise

## 2019-05-15 ENCOUNTER — Other Ambulatory Visit: Payer: Self-pay | Admitting: Internal Medicine

## 2019-05-17 MED ORDER — AMPHETAMINE-DEXTROAMPHETAMINE 30 MG PO TABS: 30.0000 mg | ORAL_TABLET | Freq: Two times a day (BID) | ORAL | 0 refills | Status: DC

## 2019-05-27 MED FILL — ALPRAZOLAM XR 3 MG TB24: 3 | 30 days supply | Qty: 30 | Fill #1

## 2019-06-01 ENCOUNTER — Encounter: Payer: Self-pay | Admitting: Family Medicine

## 2019-06-01 ENCOUNTER — Other Ambulatory Visit: Payer: Self-pay

## 2019-06-01 ENCOUNTER — Ambulatory Visit (INDEPENDENT_AMBULATORY_CARE_PROVIDER_SITE_OTHER): Payer: Medicare Other | Admitting: Family Medicine

## 2019-06-01 DIAGNOSIS — F1011 Alcohol abuse, in remission: Secondary | ICD-10-CM | POA: Diagnosis not present

## 2019-06-01 DIAGNOSIS — F908 Attention-deficit hyperactivity disorder, other type: Secondary | ICD-10-CM

## 2019-06-01 DIAGNOSIS — F3160 Bipolar disorder, current episode mixed, unspecified: Secondary | ICD-10-CM | POA: Diagnosis not present

## 2019-06-01 MED ORDER — OLANZAPINE 5 MG PO TABS: 5.0000 mg | ORAL_TABLET | Freq: Every day | ORAL | 1 refills | Status: DC

## 2019-06-01 NOTE — Progress Notes (Signed)
Virtual Visit via Video Note  I connected with Jesse KielMatthew A Tupper on 06/01/19 at  2:40 PM EDT by a video enabled telemedicine application and verified that I am speaking with the correct person using two identifiers.  Location: Patient: home Provider: office   I discussed the limitations of evaluation and management by telemedicine and the availability of in person appointments. The patient expressed understanding and agreed to proceed. Crissie SicklesPrincess Carter, CMA was able to get patient set up on visit, video.    Subjective:    Patient ID: Jesse Jones, male    DOB: 1981/08/05, 38 y.o.   MRN: [id number]  Chief Complaint  Patient presents with  . Depression    pt reports mood swings     HPI Patient is in today for evaluation of his worsening mood swings. He admits to increased agitation and mood swings. Notes some paranoia and his father who is with him confirms. No suicidal ideation, hallucinations. He was struggling with depression but that is some improved. His sleep is disrupted. He is unwilling to leave his apartment most days. Denies CP/palp/SOB/HA/congestion/fevers/GI or GU c/o. Taking meds as prescribed. He denies any obvious triggers.   Past Medical History:  Diagnosis Date  . ADD (attention deficit disorder)   . ADHD (attention deficit hyperactivity disorder) 04/02/2011  . 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 Anxiety   . 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 Concussion    X 6- 7  . 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 Depression   . ED (erectile dysfunction) 05/12/2012  . Hyperlipidemia, mild 04/18/2015  . Insomnia   . 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 Outbursts of anger   . Preventative health care 10/03/2011  . Tobacco abuse 04/28/2011    Past Surgical History:  Procedure Laterality Date  . NASAL SEPTUM SURGERY Bilateral    Dr Jenne PaneBates 2017  . punctured lung    . toe surgeries  during childhood    for ingrown toenails    Family History  Problem Relation Age  of Onset  . Depression Mother   . Anxiety disorder Mother   . Depression Brother   . Diabetes Brother        type 1  . Cancer Paternal Grandmother        lung/ didn't smoke  . Cancer Maternal Uncle [id number]       colon cancer    Social History   Socioeconomic History  . Marital status: Single    Spouse name: Not on file  . Number of children: Not on file  . Years of education: Not on file  . Highest education level: Not on file  Occupational History  . Not on file  Social Needs  . Financial resource strain: Not on file  . Food insecurity    Worry: Not on file    Inability: Not on file  . Transportation needs    Medical: Not on file    Non-medical: Not on file  Tobacco Use  . Smoking status: Current Every Day Smoker    Packs/day: 0.30    Years: 20.00    Pack years: 6.00    Types: Cigarettes  . Smokeless tobacco: Never Used  Substance and Sexual Activity  . Alcohol use: Yes    Alcohol/week: 36.0 standard drinks    Types: 36 Cans of beer per week    Comment: sober [id number]. No alcohol.  . Drug use: Yes    Types: Marijuana, Other-see comments    Comment:  pt stopped 4 year ago  . Sexual activity: Yes    Partners: Female    Comment: lives alone and eating well. exercising  Lifestyle  . Physical activity    Days per week: Not on file    Minutes per session: Not on file  . Stress: Not on file  Relationships  . Social Musicianconnections    Talks on phone: Not on file    Gets together: Not on file    Attends religious service: Not on file    Active member of club or organization: Not on file    Attends meetings of clubs or organizations: Not on file    Relationship status: Not on file  . Intimate partner violence    Fear of current or ex partner: Not on file    Emotionally abused: Not on file    Physically abused: Not on file    Forced sexual activity: Not on file  Other Topics Concern  . Not on file  Social History Narrative  . Not on file    Outpatient Medications Prior  to Visit  Medication Sig Dispense Refill  . ALPRAZOLAM XR 3 MG 24 hr tablet TAKE 1 TABLET BY MOUTH BY MOUTH EVERY MORNING 30 tablet 1  . amphetamine-dextroamphetamine (ADDERALL) 30 MG tablet Take 1 tablet by mouth 2 (two) times daily. 60 tablet 0  . cholecalciferol (VITAMIN D) 1000 UNITS tablet Take 1,000 Units by mouth every morning.    . citalopram (CELEXA) 20 MG tablet Take 1 tablet (20 mg total) by mouth 2 (two) times daily. 180 tablet 1  . clindamycin-benzoyl peroxide (BENZACLIN) gel Apply topically 2 (two) times daily. 25 g 2  . fish oil-omega-3 fatty acids 1000 MG capsule Take 2 g by mouth daily.    . fluticasone (FLONASE) 50 MCG/ACT nasal spray Place 2 sprays into both nostrils daily as needed for allergies or rhinitis. 16 g 6  . Multiple Vitamin (MULTIVITAMIN) tablet Take 1 tablet by mouth every morning.     . mupirocin ointment (BACTROBAN) 2 % Place 1 application into the nose 2 (two) times daily. 22 g 0  . nicotine (NICODERM CQ) 7 mg/24hr patch Place 1 patch (7 mg total) onto the skin daily. 28 patch 1  . vitamin B-12 (CYANOCOBALAMIN) 1000 MCG tablet Take 1,000 mcg by mouth every morning.    . vitamin C (ASCORBIC ACID) 500 MG tablet Take 500 mg by mouth daily.     No facility-administered medications prior to visit.     Allergies  Allergen Reactions  . Haloperidol Decanoate   . Hydroxyzine Anxiety    EXCESSIVE ANXIOUNESS  . Other Other (See Comments)    Reverse effect, super hyper  . Penicillins Hives  . Valium     Review of Systems  Constitutional: Negative for fever and malaise/fatigue.  HENT: Negative for congestion.   Eyes: Negative for blurred vision.  Respiratory: Negative for shortness of breath.   Cardiovascular: Negative for chest pain, palpitations and leg swelling.  Gastrointestinal: Negative for abdominal pain, blood in stool and nausea.  Genitourinary: Negative for dysuria and frequency.  Musculoskeletal: Negative for falls.  Skin: Negative for rash.   Neurological: Negative for dizziness, loss of consciousness and headaches.  Endo/Heme/Allergies: Negative for environmental allergies.  Psychiatric/Behavioral: Positive for depression. Negative for hallucinations, substance abuse and suicidal ideas. The patient is nervous/anxious and has insomnia.        Objective:    Physical Exam Constitutional:      Appearance: Normal appearance.  He is not ill-appearing.  HENT:     Head: Normocephalic and atraumatic.     Nose: Nose normal.  Eyes:     General:        Right eye: No discharge.        Left eye: No discharge.  Pulmonary:     Effort: Pulmonary effort is normal.  Neurological:     Mental Status: He is alert and oriented to person, place, and time.  Psychiatric:        Behavior: Behavior normal.     BP 114/80   Pulse 80   Ht 6\' 2"  (1.88 m)   Wt 175 lb (79.4 kg)   BMI 22.47 kg/m  Wt Readings from Last 3 Encounters:  06/01/19 175 lb (79.4 kg)  01/23/19 169 lb (76.7 kg)  12/01/18 165 lb 12.8 oz (75.2 kg)    Diabetic Foot Exam - Simple   No data filed     Lab Results  Component Value Date   WBC 7.5 09/30/2018   HGB 16.6 09/30/2018   HCT 48.6 09/30/2018   PLT 291.0 09/30/2018   GLUCOSE 93 09/30/2018   CHOL 163 09/30/2018   TRIG 62.0 09/30/2018   HDL 59.90 09/30/2018   LDLCALC 91 09/30/2018   ALT 18 09/30/2018   AST 17 09/30/2018   NA 137 09/30/2018   K 4.7 09/30/2018   CL 98 09/30/2018   CREATININE 1.01 09/30/2018   BUN 10 09/30/2018   CO2 33 (H) 09/30/2018   TSH 1.82 09/30/2018    Lab Results  Component Value Date   TSH 1.82 09/30/2018   Lab Results  Component Value Date   WBC 7.5 09/30/2018   HGB 16.6 09/30/2018   HCT 48.6 09/30/2018   MCV 87.2 09/30/2018   PLT 291.0 09/30/2018   Lab Results  Component Value Date   NA 137 09/30/2018   K 4.7 09/30/2018   CO2 33 (H) 09/30/2018   GLUCOSE 93 09/30/2018   BUN 10 09/30/2018   CREATININE 1.01 09/30/2018   BILITOT 0.6 09/30/2018   ALKPHOS 73  09/30/2018   AST 17 09/30/2018   ALT 18 09/30/2018   PROT 7.3 09/30/2018   ALBUMIN 5.1 09/30/2018   CALCIUM 10.1 09/30/2018   GFR 88.78 09/30/2018   Lab Results  Component Value Date   CHOL 163 09/30/2018   Lab Results  Component Value Date   HDL 59.90 09/30/2018   Lab Results  Component Value Date   LDLCALC 91 09/30/2018   Lab Results  Component Value Date   TRIG 62.0 09/30/2018   Lab Results  Component Value Date   CHOLHDL 3 09/30/2018   No results found for: HGBA1C     Assessment & Plan:   Problem List Items Addressed This Visit    ADHD (attention deficit hyperactivity disorder)    No change in meds.       Bipolar disorder, mixed ([place])    His depressive symptoms have improved now but he is struggling with mania and agitation. He agrees to Zyprexa 5 mg qhs and reassess next week for medication adjustment he will call sooner if things worsen. He is counseled for 20 minutes of a 25 minute appointment.       Alcohol abuse, in remission    Continues to abstain.         I am having Jesse Levels "Matt" start on OLANZapine. I am also having him maintain his multivitamin, vitamin B-12, cholecalciferol, fish oil-omega-3 fatty acids, vitamin C,  fluticasone, clindamycin-benzoyl peroxide, nicotine, citalopram, mupirocin ointment, ALPRAZolam XR, and amphetamine-dextroamphetamine.  Meds ordered this encounter  Medications  . OLANZapine (ZYPREXA) 5 MG tablet    Sig: Take 1 tablet (5 mg total) by mouth at bedtime.    Dispense:  30 tablet    Refill:  1    I discussed the assessment and treatment plan with the patient. The patient was provided an opportunity to ask questions and all were answered. The patient agreed with the plan and demonstrated an understanding of the instructions.   The patient was advised to call back or seek an in-person evaluation if the symptoms worsen or if the condition fails to improve as anticipated.  I provided 25 minutes of  non-face-to-face time during this encounter.   Danise EdgeStacey Blyth, MD

## 2019-06-01 NOTE — Assessment & Plan Note (Signed)
Continues to abstain 

## 2019-06-01 NOTE — Assessment & Plan Note (Signed)
No change in meds.

## 2019-06-01 NOTE — Assessment & Plan Note (Signed)
His depressive symptoms have improved now but he is struggling with mania and agitation. He agrees to Zyprexa 5 mg qhs and reassess next week for medication adjustment he will call sooner if things worsen. He is counseled for 20 minutes of a 25 minute appointment.

## 2019-06-14 ENCOUNTER — Other Ambulatory Visit: Payer: Self-pay | Admitting: Family Medicine

## 2019-06-15 ENCOUNTER — Encounter: Payer: Self-pay | Admitting: Family Medicine

## 2019-06-15 MED ORDER — AMPHETAMINE-DEXTROAMPHETAMINE 30 MG PO TABS: 30.0000 mg | ORAL_TABLET | Freq: Two times a day (BID) | ORAL | 0 refills | Status: DC

## 2019-06-18 ENCOUNTER — Telehealth: Payer: Self-pay | Admitting: *Deleted

## 2019-06-18 NOTE — Telephone Encounter (Signed)
Please call to set up appointment for next week to follow up depression

## 2019-06-18 NOTE — Telephone Encounter (Signed)
Note sent to scheduling to call and make an appointment.

## 2019-06-23 ENCOUNTER — Other Ambulatory Visit: Payer: Self-pay | Admitting: Family Medicine

## 2019-06-24 ENCOUNTER — Other Ambulatory Visit: Payer: Self-pay | Admitting: Family Medicine

## 2019-06-24 MED FILL — ALPRAZOLAM XR 3 MG TB24: 3 | 30 days supply | Qty: 30 | Fill #0

## 2019-06-24 NOTE — Telephone Encounter (Signed)
Requesting:xanax Contract: yes QMG:NOIBB one  Last OV:06/01/19 Next OV:06/25/19 Last Refill:04/27/19  #30-1rf Database:   Please advise

## 2019-06-25 ENCOUNTER — Other Ambulatory Visit: Payer: Self-pay

## 2019-06-25 ENCOUNTER — Ambulatory Visit (INDEPENDENT_AMBULATORY_CARE_PROVIDER_SITE_OTHER): Payer: Medicare Other | Admitting: Family Medicine

## 2019-06-25 DIAGNOSIS — F1011 Alcohol abuse, in remission: Secondary | ICD-10-CM

## 2019-06-25 DIAGNOSIS — E785 Hyperlipidemia, unspecified: Secondary | ICD-10-CM | POA: Diagnosis not present

## 2019-06-25 DIAGNOSIS — F3160 Bipolar disorder, current episode mixed, unspecified: Secondary | ICD-10-CM

## 2019-06-25 DIAGNOSIS — F17219 Nicotine dependence, cigarettes, with unspecified nicotine-induced disorders: Secondary | ICD-10-CM | POA: Diagnosis not present

## 2019-06-25 MED ORDER — CITALOPRAM HYDROBROMIDE 20 MG PO TABS: 20.0000 mg | ORAL_TABLET | Freq: Every day | ORAL | 1 refills | Status: DC

## 2019-06-28 NOTE — Assessment & Plan Note (Signed)
Continues to abstain 

## 2019-06-28 NOTE — Assessment & Plan Note (Signed)
Encouraged heart healthy diet, increase exercise, avoid trans fats, consider a krill oil cap daily 

## 2019-06-28 NOTE — Progress Notes (Signed)
Virtual Visit via Video Note  I connected with [name] on 06/25/19 at  1:20 PM EDT by a video enabled telemedicine application and verified that I am speaking with the correct person using two identifiers.  Location: Patient: home Provider: home   I discussed the limitations of evaluation and management by telemedicine and the availability of in person appointments. The patient expressed understanding and agreed to proceed. Magdalene Molly, CMA was able to get patient set up on visit, video   Subjective:    Patient ID: Jesse Jones, male    DOB: 1981-04-17, 38 y.o.   MRN: [id number]  Chief Complaint  Patient presents with  . Depression    follow up PHQ9 completed    HPI Patient is in today for follow up on bipolar disorder, nicotine addiction, hyperlipidemia and more. He is feeling much better with the addition of Olanzapine. He reports almost immediately noting a decrease in paranoid thinking, racing thoughts and anxiety. He reports feeling 80% better. He experienced some dry mouth immediately but that has improved. No recent febrile illness or hospitalizations. Denies CP/palp/SOB/HA/congestion/fevers/GI or GU c/o. Taking meds as prescribed  Past Medical History:  Diagnosis Date  . ADD (attention deficit disorder)   . ADHD (attention deficit hyperactivity disorder) 04/02/2011  . 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 Anxiety   . 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 Concussion    X 6- 7  . 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 Depression   . ED (erectile dysfunction) 05/12/2012  . Hyperlipidemia, mild 04/18/2015  . Insomnia   . 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 Outbursts of anger   . Preventative health care 10/03/2011  . Tobacco abuse 04/28/2011    Past Surgical History:  Procedure Laterality Date  . NASAL SEPTUM SURGERY Bilateral    Dr Redmond Baseman 2017  . punctured lung    . toe surgeries  during childhood    for ingrown toenails    Family History  Problem  Relation Age of Onset  . Depression Mother   . Anxiety disorder Mother   . Depression Brother   . Diabetes Brother        type 1  . Cancer Paternal Grandmother        lung/ didn't smoke  . Cancer Maternal Uncle 33       colon cancer    Social History   Socioeconomic History  . Marital status: Single    Spouse name: Not on file  . Number of children: Not on file  . Years of education: Not on file  . Highest education level: Not on file  Occupational History  . Not on file  Social Needs  . Financial resource strain: Not on file  . Food insecurity    Worry: Not on file    Inability: Not on file  . Transportation needs    Medical: Not on file    Non-medical: Not on file  Tobacco Use  . Smoking status: Current Every Day Smoker    Packs/day: 0.30    Years: 20.00    Pack years: 6.00    Types: Cigarettes  . Smokeless tobacco: Never Used  Substance and Sexual Activity  . Alcohol use: Yes    Alcohol/week: 36.0 standard drinks    Types: 36 Cans of beer per week    Comment: sober x49yr. No alcohol.  . Drug use: Yes    Types: Marijuana, Other-see comments    Comment: pt stopped 4 year ago  .  Sexual activity: Yes    Partners: Female    Comment: lives alone and eating well. exercising  Lifestyle  . Physical activity    Days per week: Not on file    Minutes per session: Not on file  . Stress: Not on file  Relationships  . Social Musician on phone: Not on file    Gets together: Not on file    Attends religious service: Not on file    Active member of club or organization: Not on file    Attends meetings of clubs or organizations: Not on file    Relationship status: Not on file  . Intimate partner violence    Fear of current or ex partner: Not on file    Emotionally abused: Not on file    Physically abused: Not on file    Forced sexual activity: Not on file  Other Topics Concern  . Not on file  Social History Narrative  . Not on file    Outpatient  Medications Prior to Visit  Medication Sig Dispense Refill  . ALPRAZOLAM XR 3 MG 24 hr tablet TAKE 1 TABLET BY MOUTH BY MOUTH EVERY MORNING 30 tablet 1  . amphetamine-dextroamphetamine (ADDERALL) 30 MG tablet Take 1 tablet by mouth 2 (two) times daily. 60 tablet 0  . cholecalciferol (VITAMIN D) 1000 UNITS tablet Take 1,000 Units by mouth every morning.    . clindamycin-benzoyl peroxide (BENZACLIN) gel Apply topically 2 (two) times daily. 25 g 2  . fish oil-omega-3 fatty acids 1000 MG capsule Take 2 g by mouth daily.    . fluticasone (FLONASE) 50 MCG/ACT nasal spray Place 2 sprays into both nostrils daily as needed for allergies or rhinitis. 16 g 6  . Multiple Vitamin (MULTIVITAMIN) tablet Take 1 tablet by mouth every morning.     . mupirocin ointment (BACTROBAN) 2 % Place 1 application into the nose 2 (two) times daily. 22 g 0  . OLANZapine (ZYPREXA) 5 MG tablet TAKE 1 TABLET BY MOUTH EVERYDAY AT BEDTIME 90 tablet 1  . vitamin B-12 (CYANOCOBALAMIN) 1000 MCG tablet Take 1,000 mcg by mouth every morning.    . vitamin C (ASCORBIC ACID) 500 MG tablet Take 500 mg by mouth daily.    . citalopram (CELEXA) 20 MG tablet Take 1 tablet (20 mg total) by mouth 2 (two) times daily. 180 tablet 1  . nicotine (NICODERM CQ) 7 mg/24hr patch Place 1 patch (7 mg total) onto the skin daily. 28 patch 1   No facility-administered medications prior to visit.     Allergies  Allergen Reactions  . Haloperidol Decanoate   . Hydroxyzine Anxiety    EXCESSIVE ANXIOUNESS  . Other Other (See Comments)    Reverse effect, super hyper  . Penicillins Hives  . Valium     ROS     Objective:    Physical Exam  There were no vitals taken for this visit. Wt Readings from Last 3 Encounters:  06/01/19 175 lb (79.4 kg)  01/23/19 169 lb (76.7 kg)  12/01/18 165 lb 12.8 oz (75.2 kg)    Diabetic Foot Exam - Simple   No data filed     Lab Results  Component Value Date   WBC 7.5 09/30/2018   HGB 16.6 09/30/2018   HCT  48.6 09/30/2018   PLT 291.0 09/30/2018   GLUCOSE 93 09/30/2018   CHOL 163 09/30/2018   TRIG 62.0 09/30/2018   HDL 59.90 09/30/2018   LDLCALC 91 09/30/2018  ALT 18 09/30/2018   AST 17 09/30/2018   NA 137 09/30/2018   K 4.7 09/30/2018   CL 98 09/30/2018   CREATININE 1.01 09/30/2018   BUN 10 09/30/2018   CO2 33 (H) 09/30/2018   TSH 1.82 09/30/2018    Lab Results  Component Value Date   TSH 1.82 09/30/2018   Lab Results  Component Value Date   WBC 7.5 09/30/2018   HGB 16.6 09/30/2018   HCT 48.6 09/30/2018   MCV 87.2 09/30/2018   PLT 291.0 09/30/2018   Lab Results  Component Value Date   NA 137 09/30/2018   K 4.7 09/30/2018   CO2 33 (H) 09/30/2018   GLUCOSE 93 09/30/2018   BUN 10 09/30/2018   CREATININE 1.01 09/30/2018   BILITOT 0.6 09/30/2018   ALKPHOS 73 09/30/2018   AST 17 09/30/2018   ALT 18 09/30/2018   PROT 7.3 09/30/2018   ALBUMIN 5.1 09/30/2018   CALCIUM 10.1 09/30/2018   GFR 88.78 09/30/2018   Lab Results  Component Value Date   CHOL 163 09/30/2018   Lab Results  Component Value Date   HDL 59.90 09/30/2018   Lab Results  Component Value Date   LDLCALC 91 09/30/2018   Lab Results  Component Value Date   TRIG 62.0 09/30/2018   Lab Results  Component Value Date   CHOLHDL 3 09/30/2018   No results found for: HGBA1C     Assessment & Plan:   Problem List Items Addressed This Visit    Nicotine dependence    Continues to abstain.       Bipolar disorder, mixed ([facility])    Doing much better with the addition of Olanzapine 5 mg qhs. Will continue citalopram 20 mg qam. Reevaluate in 1 month.       Alcohol abuse, in remission    Continues to abstain.      Hyperlipidemia, mild    Encouraged heart healthy diet, increase exercise, avoid trans fats, consider a krill oil cap daily         I have changed Magda KielMatthew A. Shellhammer "Matt"'s citalopram. I am also having him maintain his multivitamin, vitamin B-12, cholecalciferol, fish oil-omega-3 fatty  acids, vitamin C, fluticasone, clindamycin-benzoyl peroxide, nicotine, mupirocin ointment, amphetamine-dextroamphetamine, ALPRAZolam XR, and OLANZapine.  Meds ordered this encounter  Medications  . citalopram (CELEXA) 20 MG tablet    Sig: Take 1 tablet (20 mg total) by mouth daily.    Dispense:  180 tablet    Refill:  1     I discussed the assessment and treatment plan with the patient. The patient was provided an opportunity to ask questions and all were answered. The patient agreed with the plan and demonstrated an understanding of the instructions.   The patient was advised to call back or seek an in-person evaluation if the symptoms worsen or if the condition fails to improve as anticipated.  I provided 25 minutes of non-face-to-face time during this encounter.   Danise EdgeStacey Blyth, MD

## 2019-06-28 NOTE — Assessment & Plan Note (Signed)
Doing much better with the addition of Olanzapine 5 mg qhs. Will continue citalopram 20 mg qam. Reevaluate in 1 month.

## 2019-07-09 ENCOUNTER — Other Ambulatory Visit: Payer: Self-pay | Admitting: Family Medicine

## 2019-07-09 MED ORDER — AMPHETAMINE-DEXTROAMPHETAMINE 30 MG PO TABS: 30.0000 mg | ORAL_TABLET | Freq: Two times a day (BID) | ORAL | 0 refills | Status: DC

## 2019-07-17 ENCOUNTER — Telehealth: Payer: Self-pay | Admitting: Family Medicine

## 2019-07-17 NOTE — Telephone Encounter (Signed)
He has a refill on his meds that he can pick up next week. How long is he going to be out of town? Where is he going. Could send a small amount to a pharmacy where he is going to hold him til he gets home if we need to

## 2019-07-17 NOTE — Telephone Encounter (Signed)
Called left message for patient to call the office back let patient know what Dr Charlett Blake stated below  Nurse triage may handle

## 2019-07-17 NOTE — Telephone Encounter (Signed)
Please advise 

## 2019-07-17 NOTE — Telephone Encounter (Signed)
Patient called in reference to the refill on the Alprazolam He is going out of town on 07/19/2019 will return on 05/25/2019 and will be out of the medication.Marland Kitchen Ph# [phone]

## 2019-07-17 NOTE — Telephone Encounter (Signed)
Medication: ALPRAZOLAM XR 3 MG 24 hr tablet   Patient requesting call back from [facility] regarding this medication. Patient states he will be going out of town this weekend and inquired how he is to pick up this prescription. Please contact patient.

## 2019-07-20 NOTE — Telephone Encounter (Signed)
zyprexa does not generally cause edema, try to minimize sodium, put feet above heart for 15 minutes tid and if no improvement let us know. I OK to give him a 7 day supply of the Alprazolam.

## 2019-07-20 NOTE — Telephone Encounter (Signed)
rx called and and patient notified of note of zyprexa.

## 2019-07-20 NOTE — Telephone Encounter (Signed)
For the alprazolam he does have a refill but it it here at our pharmacy and he just needs 7 pills to get him through.  [facility] would [facility].  Also please address the zyprexa, he thinks it is causing a little bit of swelling in his feet.  He feels the med is working but wanted to let you know about this side effect.  He will continue to take unless you tell him otherwise.

## 2019-07-20 NOTE — Telephone Encounter (Signed)
1. Pt calling this am, and is in [place], [place].   Pt will be there until Sunday 07/26/19  ALPRAZOLAM XR 3 MG 24 hr tablet  Please send 7 pills  to [facility] Address: [address], [place], [place] [address]  Phone: [phone]  If this [facility] does not have, please send to  : [facility] Address: [address] [place], [place] [address]  Phone: [phone]   2. pt states the OLANZapine (ZYPREXA) 5 MG tablet is causing a little bit of feet swelling.  Pt states he feels this med is working, but wanted to let you know about this side effect.  Pt will continue to take unless you tell him otherwise.

## 2019-07-20 NOTE — Telephone Encounter (Signed)
Called patient unable to leave voicemail  If patient calls back nurse triage may handle

## 2019-07-28 ENCOUNTER — Ambulatory Visit (INDEPENDENT_AMBULATORY_CARE_PROVIDER_SITE_OTHER): Payer: Medicare Other | Admitting: Family Medicine

## 2019-07-28 ENCOUNTER — Other Ambulatory Visit: Payer: Self-pay

## 2019-07-28 VITALS — BP 128/85 | HR 85 | Temp 96.7°F | Wt 210.0 lb

## 2019-07-28 DIAGNOSIS — E785 Hyperlipidemia, unspecified: Secondary | ICD-10-CM

## 2019-07-28 DIAGNOSIS — R000 Tachycardia, unspecified: Secondary | ICD-10-CM

## 2019-07-28 DIAGNOSIS — F3160 Bipolar disorder, current episode mixed, unspecified: Secondary | ICD-10-CM | POA: Diagnosis not present

## 2019-07-28 DIAGNOSIS — Z79899 Other long term (current) drug therapy: Secondary | ICD-10-CM

## 2019-07-28 MED ORDER — FUROSEMIDE 20 MG PO TABS: 20.0000 mg | ORAL_TABLET | Freq: Every day | ORAL | 3 refills | Status: DC | PRN

## 2019-07-28 MED ORDER — OLANZAPINE 2.5 MG PO TABS: 2.5000 mg | ORAL_TABLET | Freq: Every day | ORAL | 2 refills | Status: DC

## 2019-07-28 NOTE — Progress Notes (Signed)
Subjective:    Patient ID: Jesse Jones, male    DOB: [birth year], 38 y.o.   MRN: [id number]  Chief Complaint  Patient presents with  . bipoler follow up  . swelling in feet and legs    has not really changed to much even with elevation and no salt and compression stocking.    HPI Patient is in today for evaluation of bipolar disorder and side effects from his Zyprexa. He has had a great response to the Zyprexa with decreased mania and depression but he has had a significant increase in appetite with a 40# weight gain. He is also having pedal edema in both legs. No recent febrile illness or other acute concerns. Denies CP/palp/SOB/HA/congestion/fevers/GI or GU c/o. Taking meds as prescribed  Past Medical History:  Diagnosis Date  . ADD (attention deficit disorder)   . ADHD (attention deficit hyperactivity disorder) 04/02/2011  . 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 Anxiety   . 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 Concussion    X 6- 7  . 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 Depression   . ED (erectile dysfunction) 05/12/2012  . Hyperlipidemia, mild 04/18/2015  . Insomnia   . 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 Outbursts of anger   . Preventative health care 10/03/2011  . Tobacco abuse 04/28/2011    Past Surgical History:  Procedure Laterality Date  . NASAL SEPTUM SURGERY Bilateral    Dr Jenne Pane 2017  . punctured lung    . toe surgeries  during childhood    for ingrown toenails    Family History  Problem Relation Age of Onset  . Depression Mother   . Anxiety disorder Mother   . Depression Brother   . Diabetes Brother        type 1  . Cancer Paternal Grandmother        lung/ didn't smoke  . Cancer Maternal Uncle 51       colon cancer    Social History   Socioeconomic History  . Marital status: Single    Spouse name: Not on file  . Number of children: Not on file  . Years of education: Not on file  . Highest education level: Not on file  Occupational  History  . Not on file  Social Needs  . Financial resource strain: Not on file  . Food insecurity    Worry: Not on file    Inability: Not on file  . Transportation needs    Medical: Not on file    Non-medical: Not on file  Tobacco Use  . Smoking status: Current Every Day Smoker    Packs/day: 0.30    Years: 20.00    Pack years: 6.00    Types: Cigarettes  . Smokeless tobacco: Never Used  Substance and Sexual Activity  . Alcohol use: Yes    Alcohol/week: 36.0 standard drinks    Types: 36 Cans of beer per week    Comment: sober x29yr. No alcohol.  . Drug use: Yes    Types: Marijuana, Other-see comments    Comment: pt stopped 4 year ago  . Sexual activity: Yes    Partners: Female    Comment: lives alone and eating well. exercising  Lifestyle  . Physical activity    Days per week: Not on file    Minutes per session: Not on file  . Stress: Not on file  Relationships  . Social connections    Talks on phone: Not on file  Gets together: Not on file    Attends religious service: Not on file    Active member of club or organization: Not on file    Attends meetings of clubs or organizations: Not on file    Relationship status: Not on file  . Intimate partner violence    Fear of current or ex partner: Not on file    Emotionally abused: Not on file    Physically abused: Not on file    Forced sexual activity: Not on file  Other Topics Concern  . Not on file  Social History Narrative  . Not on file    Outpatient Medications Prior to Visit  Medication Sig Dispense Refill  . ALPRAZOLAM XR 3 MG 24 hr tablet TAKE 1 TABLET BY MOUTH BY MOUTH EVERY MORNING 30 tablet 1  . amphetamine-dextroamphetamine (ADDERALL) 30 MG tablet Take 1 tablet by mouth 2 (two) times daily. 60 tablet 0  . cholecalciferol (VITAMIN D) 1000 UNITS tablet Take 1,000 Units by mouth every morning.    . citalopram (CELEXA) 20 MG tablet Take 1 tablet (20 mg total) by mouth daily. 180 tablet 1  .  clindamycin-benzoyl peroxide (BENZACLIN) gel Apply topically 2 (two) times daily. 25 g 2  . fish oil-omega-3 fatty acids 1000 MG capsule Take 2 g by mouth daily.    . fluticasone (FLONASE) 50 MCG/ACT nasal spray Place 2 sprays into both nostrils daily as needed for allergies or rhinitis. 16 g 6  . Multiple Vitamin (MULTIVITAMIN) tablet Take 1 tablet by mouth every morning.     . mupirocin ointment (BACTROBAN) 2 % Place 1 application into the nose 2 (two) times daily. 22 g 0  . OLANZapine (ZYPREXA) 5 MG tablet TAKE 1 TABLET BY MOUTH EVERYDAY AT BEDTIME 90 tablet 1  . vitamin B-12 (CYANOCOBALAMIN) 1000 MCG tablet Take 1,000 mcg by mouth every morning.    . vitamin C (ASCORBIC ACID) 500 MG tablet Take 500 mg by mouth daily.    . nicotine (NICODERM CQ) 7 mg/24hr patch Place 1 patch (7 mg total) onto the skin daily. 28 patch 1   No facility-administered medications prior to visit.     Allergies  Allergen Reactions  . Haloperidol Decanoate   . Hydroxyzine Anxiety    EXCESSIVE ANXIOUNESS  . Other Other (See Comments)    Reverse effect, super hyper  . Penicillins Hives  . Valium     Review of Systems  Constitutional: Positive for malaise/fatigue. Negative for fever.  HENT: Negative for congestion.   Eyes: Negative for blurred vision.  Respiratory: Negative for shortness of breath.   Cardiovascular: Positive for leg swelling. Negative for chest pain and palpitations.  Gastrointestinal: Negative for abdominal pain, blood in stool and nausea.  Genitourinary: Negative for dysuria and frequency.  Musculoskeletal: Negative for falls.  Skin: Negative for rash.  Neurological: Negative for dizziness, loss of consciousness and headaches.  Endo/Heme/Allergies: Negative for environmental allergies.  Psychiatric/Behavioral: Negative for depression. The patient is not nervous/anxious.        Objective:    Physical Exam Constitutional:      Appearance: Normal appearance. He is not ill-appearing.   HENT:     Head: Normocephalic and atraumatic.     Nose: Nose normal.  Eyes:     General:        Right eye: No discharge.        Left eye: No discharge.  Pulmonary:     Effort: Pulmonary effort is normal.  Musculoskeletal:  Right lower leg: Edema present.     Left lower leg: Edema present.  Neurological:     Mental Status: He is alert.     BP 128/85   Pulse 85   Temp (!) 96.7 F (35.9 C) (Oral)   Wt 210 lb (95.3 kg)   BMI 26.96 kg/m  Wt Readings from Last 3 Encounters:  07/28/19 210 lb (95.3 kg)  06/01/19 175 lb (79.4 kg)  01/23/19 169 lb (76.7 kg)    Diabetic Foot Exam - Simple   No data filed     Lab Results  Component Value Date   WBC 7.5 09/30/2018   HGB 16.6 09/30/2018   HCT 48.6 09/30/2018   PLT 291.0 09/30/2018   GLUCOSE 93 09/30/2018   CHOL 163 09/30/2018   TRIG 62.0 09/30/2018   HDL 59.90 09/30/2018   LDLCALC 91 09/30/2018   ALT 18 09/30/2018   AST 17 09/30/2018   NA 137 09/30/2018   K 4.7 09/30/2018   CL 98 09/30/2018   CREATININE 1.01 09/30/2018   BUN 10 09/30/2018   CO2 33 (H) 09/30/2018   TSH 1.82 09/30/2018    Lab Results  Component Value Date   TSH 1.82 09/30/2018   Lab Results  Component Value Date   WBC 7.5 09/30/2018   HGB 16.6 09/30/2018   HCT 48.6 09/30/2018   MCV 87.2 09/30/2018   PLT 291.0 09/30/2018   Lab Results  Component Value Date   NA 137 09/30/2018   K 4.7 09/30/2018   CO2 33 (H) 09/30/2018   GLUCOSE 93 09/30/2018   BUN 10 09/30/2018   CREATININE 1.01 09/30/2018   BILITOT 0.6 09/30/2018   ALKPHOS 73 09/30/2018   AST 17 09/30/2018   ALT 18 09/30/2018   PROT 7.3 09/30/2018   ALBUMIN 5.1 09/30/2018   CALCIUM 10.1 09/30/2018   GFR 88.78 09/30/2018   Lab Results  Component Value Date   CHOL 163 09/30/2018   Lab Results  Component Value Date   HDL 59.90 09/30/2018   Lab Results  Component Value Date   LDLCALC 91 09/30/2018   Lab Results  Component Value Date   TRIG 62.0 09/30/2018   Lab  Results  Component Value Date   CHOLHDL 3 09/30/2018   No results found for: HGBA1C     Assessment & Plan:   Problem List Items Addressed This Visit    Tachycardia   Relevant Orders   CBC   Comprehensive metabolic panel   TSH   Bipolar disorder, mixed ([facility])    He has had a great response to zyprexa and his mood is improved and he feels much better but he has gained 40 pounds and is having pedal edema. Will cut down on the Zyprexa to 2.5 mg and he is given Lasix 20 mg to use daily prn and he will weigh himself daily. Check labs next week and follow up visit in 2-3 weeks. Call if any concerns. Counseled regarding concerns for 20 minutes of a 25 minute visit.       Hyperlipidemia, mild - Primary   Relevant Medications   furosemide (LASIX) 20 MG tablet   Other Relevant Orders   Lipid panel    Other Visit Diagnoses    High risk medication use       Relevant Orders   Pain Mgmt, Profile 8 w/Conf, U      I have discontinued [name] "Matt"'s nicotine. I am also having him start on furosemide and OLANZapine.  Additionally, I am having him maintain his multivitamin, vitamin B-12, cholecalciferol, fish oil-omega-3 fatty acids, vitamin C, fluticasone, clindamycin-benzoyl peroxide, mupirocin ointment, ALPRAZolam XR, OLANZapine, citalopram, and amphetamine-dextroamphetamine.  Meds ordered this encounter  Medications  . furosemide (LASIX) 20 MG tablet    Sig: Take 1 tablet (20 mg total) by mouth daily as needed.    Dispense:  30 tablet    Refill:  3  . OLANZapine (ZYPREXA) 2.5 MG tablet    Sig: Take 1 tablet (2.5 mg total) by mouth at bedtime.    Dispense:  30 tablet    Refill:  2     Danise Edge, MD

## 2019-07-28 NOTE — Assessment & Plan Note (Addendum)
He has had a great response to zyprexa and his mood is improved and he feels much better but he has gained 40 pounds and is having pedal edema. Will cut down on the Zyprexa to 2.5 mg and he is given Lasix 20 mg to use daily prn and he will weigh himself daily. Check labs next week and follow up visit in 2-3 weeks. Call if any concerns. Counseled regarding concerns for 20 minutes of a 25 minute visit.

## 2019-08-07 ENCOUNTER — Other Ambulatory Visit (INDEPENDENT_AMBULATORY_CARE_PROVIDER_SITE_OTHER): Payer: Medicare Other

## 2019-08-07 ENCOUNTER — Ambulatory Visit (INDEPENDENT_AMBULATORY_CARE_PROVIDER_SITE_OTHER): Payer: Medicare Other | Admitting: *Deleted

## 2019-08-07 ENCOUNTER — Other Ambulatory Visit: Payer: Self-pay

## 2019-08-07 DIAGNOSIS — Z79899 Other long term (current) drug therapy: Secondary | ICD-10-CM

## 2019-08-07 DIAGNOSIS — E785 Hyperlipidemia, unspecified: Secondary | ICD-10-CM

## 2019-08-07 DIAGNOSIS — R000 Tachycardia, unspecified: Secondary | ICD-10-CM

## 2019-08-07 DIAGNOSIS — H60391 Other infective otitis externa, right ear: Secondary | ICD-10-CM | POA: Diagnosis not present

## 2019-08-07 DIAGNOSIS — Z23 Encounter for immunization: Secondary | ICD-10-CM

## 2019-08-07 NOTE — Progress Notes (Signed)
Patient here for flu vaccine.  Vaccine given in left deltoid and patient tolerated well. 

## 2019-08-07 NOTE — Addendum Note (Signed)
Addended by: Kelle Darting A on: 08/07/2019 02:55 PM   Modules accepted: Orders

## 2019-08-08 ENCOUNTER — Other Ambulatory Visit: Payer: Self-pay | Admitting: Family Medicine

## 2019-08-08 LAB — COMPREHENSIVE METABOLIC PANEL
AG Ratio: 1.5 (calc) (ref 1.0–2.5)
ALT: 18 U/L (ref 9–46)
AST: 19 U/L (ref 10–40)
Albumin: 4 g/dL (ref 3.6–5.1)
Alkaline phosphatase (APISO): 80 U/L (ref 36–130)
BUN: 13 mg/dL (ref 7–25)
CO2: 24 mmol/L (ref 20–32)
Calcium: 9.2 mg/dL (ref 8.6–10.3)
Chloride: 103 mmol/L (ref 98–110)
Creat: 0.83 mg/dL (ref 0.60–1.35)
Globulin: 2.6 g/dL (calc) (ref 1.9–3.7)
Glucose, Bld: 116 mg/dL — ABNORMAL HIGH (ref 65–99)
Potassium: 4.1 mmol/L (ref 3.5–5.3)
Sodium: 139 mmol/L (ref 135–146)
Total Bilirubin: 0.3 mg/dL (ref 0.2–1.2)
Total Protein: 6.6 g/dL (ref 6.1–8.1)

## 2019-08-08 LAB — CBC
HCT: 44.4 % (ref 38.5–50.0)
Hemoglobin: 15 g/dL (ref 13.2–17.1)
MCH: 29.2 pg (ref 27.0–33.0)
MCHC: 33.8 g/dL (ref 32.0–36.0)
MCV: 86.5 fL (ref 80.0–100.0)
MPV: 10.3 fL (ref 7.5–12.5)
Platelets: 334 10*3/uL (ref 140–400)
RBC: 5.13 10*6/uL (ref 4.20–5.80)
RDW: 13.6 % (ref 11.0–15.0)
WBC: 10.7 10*3/uL (ref 3.8–10.8)

## 2019-08-08 LAB — LIPID PANEL
Cholesterol: 181 mg/dL (ref <200)
HDL: 49 mg/dL (ref >=40)
LDL Cholesterol (Calc): 87 mg/dL (calc)
Non-HDL Cholesterol (Calc): 132 mg/dL (calc) — ABNORMAL HIGH (ref <130)
Total CHOL/HDL Ratio: 3.7 (calc) (ref <5.0)
Triglycerides: 337 mg/dL — ABNORMAL HIGH (ref <150)

## 2019-08-08 LAB — TSH: TSH: 1.2 mIU/L (ref 0.40–4.50)

## 2019-08-09 LAB — PAIN MGMT, PROFILE 8 W/CONF, U
6 Acetylmorphine: NEGATIVE ng/mL
Alcohol Metabolites: NEGATIVE ng/mL (ref <500)
Alphahydroxyalprazolam: 253 ng/mL
Alphahydroxymidazolam: NEGATIVE ng/mL
Alphahydroxytriazolam: NEGATIVE ng/mL
Aminoclonazepam: NEGATIVE ng/mL
Amphetamines: NEGATIVE ng/mL
Benzodiazepines: POSITIVE ng/mL
Buprenorphine, Urine: NEGATIVE ng/mL
Cocaine Metabolite: NEGATIVE ng/mL
Creatinine: 54.1 mg/dL
Hydroxyethylflurazepam: NEGATIVE ng/mL
Lorazepam: NEGATIVE ng/mL
MDMA: NEGATIVE ng/mL
Marijuana Metabolite: NEGATIVE ng/mL
Nordiazepam: NEGATIVE ng/mL
Opiates: NEGATIVE ng/mL
Oxazepam: NEGATIVE ng/mL
Oxidant: NEGATIVE ug/mL
Oxycodone: NEGATIVE ng/mL
Temazepam: NEGATIVE ng/mL
pH: 5.7 (ref 4.5–9.0)

## 2019-08-10 MED ORDER — AMPHETAMINE-DEXTROAMPHETAMINE 30 MG PO TABS: 30.0000 mg | ORAL_TABLET | Freq: Two times a day (BID) | ORAL | 0 refills | Status: DC

## 2019-08-10 NOTE — Telephone Encounter (Signed)
Requesting:Adderall Contract:04/01/2018 UDS:08/07/2019 Last Visit:07/28/2019 Next Visit:08/25/2019 Last Refill:07/09/2019  Please Advise

## 2019-08-19 ENCOUNTER — Other Ambulatory Visit: Payer: Self-pay | Admitting: Family Medicine

## 2019-08-21 ENCOUNTER — Other Ambulatory Visit: Payer: Self-pay | Admitting: Family Medicine

## 2019-08-21 NOTE — Telephone Encounter (Signed)
Requesting:xanax Contract:yes UDS:low risk next screen 02/05/20 Last OV:07/28/19 Next OV:08/25/19 Last Refill:06/24/19  #[id number] Database:   Please advise

## 2019-08-24 MED FILL — ALPRAZOLAM XR 3 MG TB24: 3 | 30 days supply | Qty: 30 | Fill #0

## 2019-08-25 ENCOUNTER — Encounter: Payer: Self-pay | Admitting: Family Medicine

## 2019-08-25 ENCOUNTER — Ambulatory Visit (INDEPENDENT_AMBULATORY_CARE_PROVIDER_SITE_OTHER): Payer: Medicare Other | Admitting: Family Medicine

## 2019-08-25 ENCOUNTER — Other Ambulatory Visit: Payer: Self-pay

## 2019-08-25 DIAGNOSIS — F1011 Alcohol abuse, in remission: Secondary | ICD-10-CM

## 2019-08-25 DIAGNOSIS — F3160 Bipolar disorder, current episode mixed, unspecified: Secondary | ICD-10-CM

## 2019-08-25 DIAGNOSIS — R600 Localized edema: Secondary | ICD-10-CM

## 2019-08-25 DIAGNOSIS — F908 Attention-deficit hyperactivity disorder, other type: Secondary | ICD-10-CM | POA: Diagnosis not present

## 2019-08-25 MED ORDER — OLANZAPINE 2.5 MG PO TABS: 2.5000 mg | ORAL_TABLET | Freq: Every day | ORAL | 1 refills | Status: DC

## 2019-08-25 MED ORDER — CITALOPRAM HYDROBROMIDE 20 MG PO TABS: 10.0000 mg | ORAL_TABLET | Freq: Every day | ORAL | 1 refills | Status: DC

## 2019-08-25 MED ORDER — OLANZAPINE 2.5 MG PO TABS: 2.5000 mg | ORAL_TABLET | Freq: Two times a day (BID) | ORAL | 2 refills | Status: DC

## 2019-08-25 MED ORDER — METHYLPHENIDATE HCL ER (OSM) 27 MG PO TBCR: 27.0000 mg | EXTENDED_RELEASE_TABLET | ORAL | 0 refills | Status: DC

## 2019-08-25 NOTE — Progress Notes (Signed)
States the swelling has went down. Hyperactive, wants to switch ADD meds. He says the Adderall only works for about 30-45 minutes.

## 2019-08-26 DIAGNOSIS — R600 Localized edema: Secondary | ICD-10-CM | POA: Insufficient documentation

## 2019-08-26 NOTE — Assessment & Plan Note (Signed)
He dropped his Zyprexa to 2.5 mg and his swelling and side effects have resolved but his sense of mania is increasing again. He will drop his dose of citalopram to 10 mg daily and then increase zyprexa  to 2.5 mg to bid. If side effects return will consider switching to abilify

## 2019-08-26 NOTE — Assessment & Plan Note (Signed)
adderral no longer working will try switching to concerta which he has used in the past

## 2019-08-26 NOTE — Assessment & Plan Note (Signed)
Improved but he may continue to weigh himself daily and take Furosemide prn

## 2019-08-26 NOTE — Assessment & Plan Note (Signed)
Continues to be in remission

## 2019-08-26 NOTE — Progress Notes (Signed)
Patient ID: XXAVIER Jones, male   DOB: 1980/11/16, 38 y.o.   MRN: [id number] Virtual Visit via Video Note  I connected with [name] on 08/25/19 at  3:20 PM EDT by a video enabled telemedicine application and verified that I am speaking with the correct person using two identifiers.  Location: Patient: home Provider:office   I discussed the limitations of evaluation and management by telemedicine and the availability of in person appointments. The patient expressed understanding and agreed to proceed. Crissie Sickles, CMA was able to get the patient set upon a video visit   Subjective:    Patient ID: Jesse Jones, male    DOB: [birth year], 38 y.o.   MRN: [id number]  No chief complaint on file.   HPI Patient is in today for follow up on ADD, bipolar disorder, insomnia and more. He is feeling well today and notes his pedal edema is largely resolved with Furosemide and decreasing Zyprexa. He notes the Zyprexa is still helping his mania some at 2.5 mg daily but not as much. He feels slightly more on edge. No suicidal ideation but does note some anhedonia. He is interested in changing his ADD med as the Adderal is no longer lasting even an hour. Denies CP/palp/SOB/HA/congestion/fevers/GI or GU c/o. Taking meds as prescribed  Past Medical History:  Diagnosis Date  . ADD (attention deficit disorder)   . ADHD (attention deficit hyperactivity disorder) 04/02/2011  . 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 Anxiety   . 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 Concussion    X 6- 7  . 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 Depression   . ED (erectile dysfunction) 05/12/2012  . Hyperlipidemia, mild 04/18/2015  . Insomnia   . 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 Outbursts of anger   . Preventative health care 10/03/2011  . Tobacco abuse 04/28/2011    Past Surgical History:  Procedure Laterality Date  . NASAL SEPTUM SURGERY Bilateral    Dr Jenne Pane 2017  . punctured lung    . toe  surgeries  during childhood    for ingrown toenails    Family History  Problem Relation Age of Onset  . Depression Mother   . Anxiety disorder Mother   . Depression Brother   . Diabetes Brother        type 1  . Cancer Paternal Grandmother        lung/ didn't smoke  . Cancer Maternal Uncle 22       colon cancer    Social History   Socioeconomic History  . Marital status: Single    Spouse name: Not on file  . Number of children: Not on file  . Years of education: Not on file  . Highest education level: Not on file  Occupational History  . Not on file  Social Needs  . Financial resource strain: Not on file  . Food insecurity    Worry: Not on file    Inability: Not on file  . Transportation needs    Medical: Not on file    Non-medical: Not on file  Tobacco Use  . Smoking status: Current Every Day Smoker    Packs/day: 0.30    Years: 20.00    Pack years: 6.00    Types: Cigarettes  . Smokeless tobacco: Never Used  Substance and Sexual Activity  . Alcohol use: Yes    Alcohol/week: 36.0 standard drinks    Types: 36 Cans of beer per week  Comment: sober x9yr. No alcohol.  . Drug use: Yes    Types: Marijuana, Other-see comments    Comment: pt stopped 4 year ago  . Sexual activity: Yes    Partners: Female    Comment: lives alone and eating well. exercising  Lifestyle  . Physical activity    Days per week: Not on file    Minutes per session: Not on file  . Stress: Not on file  Relationships  . Social Musician on phone: Not on file    Gets together: Not on file    Attends religious service: Not on file    Active member of club or organization: Not on file    Attends meetings of clubs or organizations: Not on file    Relationship status: Not on file  . Intimate partner violence    Fear of current or ex partner: Not on file    Emotionally abused: Not on file    Physically abused: Not on file    Forced sexual activity: Not on file  Other Topics  Concern  . Not on file  Social History Narrative  . Not on file    Outpatient Medications Prior to Visit  Medication Sig Dispense Refill  . ALPRAZOLAM XR 3 MG 24 hr tablet TAKE 1 TABLET BY MOUTH BY MOUTH EVERY MORNING 30 tablet 1  . cholecalciferol (VITAMIN D) 1000 UNITS tablet Take 1,000 Units by mouth every morning.    . clindamycin-benzoyl peroxide (BENZACLIN) gel Apply topically 2 (two) times daily. 25 g 2  . fish oil-omega-3 fatty acids 1000 MG capsule Take 2 g by mouth daily.    . fluticasone (FLONASE) 50 MCG/ACT nasal spray Place 2 sprays into both nostrils daily as needed for allergies or rhinitis. 16 g 6  . furosemide (LASIX) 20 MG tablet Take 1 tablet (20 mg total) by mouth daily as needed. 30 tablet 3  . Multiple Vitamin (MULTIVITAMIN) tablet Take 1 tablet by mouth every morning.     . mupirocin ointment (BACTROBAN) 2 % Place 1 application into the nose 2 (two) times daily. 22 g 0  . vitamin B-12 (CYANOCOBALAMIN) 1000 MCG tablet Take 1,000 mcg by mouth every morning.    . vitamin C (ASCORBIC ACID) 500 MG tablet Take 500 mg by mouth daily.    Marland Kitchen amphetamine-dextroamphetamine (ADDERALL) 30 MG tablet Take 1 tablet by mouth 2 (two) times daily. 60 tablet 0  . citalopram (CELEXA) 20 MG tablet Take 1 tablet (20 mg total) by mouth daily. 180 tablet 1  . OLANZapine (ZYPREXA) 2.5 MG tablet TAKE 1 TABLET (2.5 MG TOTAL) BY MOUTH AT BEDTIME. 90 tablet 1  . OLANZapine (ZYPREXA) 5 MG tablet TAKE 1 TABLET BY MOUTH EVERYDAY AT BEDTIME 90 tablet 1   No facility-administered medications prior to visit.     Allergies  Allergen Reactions  . Haloperidol Decanoate   . Hydroxyzine Anxiety    EXCESSIVE ANXIOUNESS  . Other Other (See Comments)    Reverse effect, super hyper  . Penicillins Hives  . Valium     Review of Systems  Constitutional: Negative for fever and malaise/fatigue.  HENT: Negative for congestion.   Eyes: Negative for blurred vision.  Respiratory: Negative for shortness of  breath.   Cardiovascular: Negative for chest pain, palpitations and leg swelling.  Gastrointestinal: Negative for abdominal pain, blood in stool and nausea.  Genitourinary: Negative for dysuria and frequency.  Musculoskeletal: Negative for falls.  Skin: Negative for rash.  Neurological:  Negative for dizziness, loss of consciousness and headaches.  Endo/Heme/Allergies: Negative for environmental allergies.  Psychiatric/Behavioral: Positive for depression. Negative for substance abuse and suicidal ideas. The patient is nervous/anxious.        Objective:    Physical Exam Constitutional:      Appearance: Normal appearance. He is not ill-appearing.  HENT:     Head: Normocephalic and atraumatic.     Nose: Nose normal.  Pulmonary:     Effort: Pulmonary effort is normal.  Musculoskeletal: Normal range of motion.     Right lower leg: No edema.     Left lower leg: No edema.  Skin:    General: Skin is dry.  Neurological:     Mental Status: He is alert and oriented to person, place, and time.  Psychiatric:        Mood and Affect: Mood normal.        Behavior: Behavior normal.     BP 124/77 (BP Location: Left Arm, Patient Position: Sitting, Cuff Size: Normal)   Pulse 70   Temp (!) 97.5 F (36.4 C) (Oral)   Resp 18   Wt 198 lb 9.6 oz (90.1 kg)   SpO2 100%   BMI 25.50 kg/m  Wt Readings from Last 3 Encounters:  08/25/19 198 lb 9.6 oz (90.1 kg)  07/28/19 210 lb (95.3 kg)  06/01/19 175 lb (79.4 kg)    Diabetic Foot Exam - Simple   No data filed     Lab Results  Component Value Date   WBC 10.7 08/07/2019   HGB 15.0 08/07/2019   HCT 44.4 08/07/2019   PLT 334 08/07/2019   GLUCOSE 116 (H) 08/07/2019   CHOL 181 08/07/2019   TRIG 337 (H) 08/07/2019   HDL 49 08/07/2019   LDLCALC 87 08/07/2019   ALT 18 08/07/2019   AST 19 08/07/2019   NA 139 08/07/2019   K 4.1 08/07/2019   CL 103 08/07/2019   CREATININE 0.83 08/07/2019   BUN 13 08/07/2019   CO2 24 08/07/2019   TSH 1.20  08/07/2019    Lab Results  Component Value Date   TSH 1.20 08/07/2019   Lab Results  Component Value Date   WBC 10.7 08/07/2019   HGB 15.0 08/07/2019   HCT 44.4 08/07/2019   MCV 86.5 08/07/2019   PLT 334 08/07/2019   Lab Results  Component Value Date   NA 139 08/07/2019   K 4.1 08/07/2019   CO2 24 08/07/2019   GLUCOSE 116 (H) 08/07/2019   BUN 13 08/07/2019   CREATININE 0.83 08/07/2019   BILITOT 0.3 08/07/2019   ALKPHOS 73 09/30/2018   AST 19 08/07/2019   ALT 18 08/07/2019   PROT 6.6 08/07/2019   ALBUMIN 5.1 09/30/2018   CALCIUM 9.2 08/07/2019   GFR 88.78 09/30/2018   Lab Results  Component Value Date   CHOL 181 08/07/2019   Lab Results  Component Value Date   HDL 49 08/07/2019   Lab Results  Component Value Date   LDLCALC 87 08/07/2019   Lab Results  Component Value Date   TRIG 337 (H) 08/07/2019   Lab Results  Component Value Date   CHOLHDL 3.7 08/07/2019   No results found for: HGBA1C     Assessment & Plan:   Problem List Items Addressed This Visit    ADHD (attention deficit hyperactivity disorder)    adderral no longer working will try switching to concerta which he has used in the past      Bipolar  disorder, mixed ([facility])    He dropped his Zyprexa to 2.5 mg and his swelling and side effects have resolved but his sense of mania is increasing again. He will drop his dose of citalopram to 10 mg daily and then increase zyprexa  to 2.5 mg to bid. If side effects return will consider switching to abilify      Alcohol abuse, in remission    Continues to be in remission      Pedal edema    Improved but he may continue to weigh himself daily and take Furosemide prn         I have discontinued [name] "Matt"'s OLANZapine, amphetamine-dextroamphetamine, and OLANZapine. I have also changed his OLANZapine and citalopram. Additionally, I am having him start on methylphenidate. Lastly, I am having him maintain his multivitamin, vitamin B-12,  cholecalciferol, fish oil-omega-3 fatty acids, vitamin C, fluticasone, clindamycin-benzoyl peroxide, mupirocin ointment, furosemide, and ALPRAZolam XR.  Meds ordered this encounter  Medications  . DISCONTD: OLANZapine (ZYPREXA) 2.5 MG tablet    Sig: Take 1 tablet (2.5 mg total) by mouth at bedtime.    Dispense:  90 tablet    Refill:  1  . OLANZapine (ZYPREXA) 2.5 MG tablet    Sig: Take 1 tablet (2.5 mg total) by mouth 2 (two) times daily.    Dispense:  60 tablet    Refill:  2  . citalopram (CELEXA) 20 MG tablet    Sig: Take 0.5 tablets (10 mg total) by mouth daily.    Dispense:  180 tablet    Refill:  1  . methylphenidate (CONCERTA) 27 MG PO CR tablet    Sig: Take 1 tablet (27 mg total) by mouth every morning.    Dispense:  30 tablet    Refill:  0     I discussed the assessment and treatment plan with the patient. The patient was provided an opportunity to ask questions and all were answered. The patient agreed with the plan and demonstrated an understanding of the instructions.   The patient was advised to call back or seek an in-person evaluation if the symptoms worsen or if the condition fails to improve as anticipated.  I provided 30 minutes of non-face-to-face time during this encounter.   Penni Homans, MD

## 2019-08-28 ENCOUNTER — Telehealth: Payer: Self-pay | Admitting: Family Medicine

## 2019-08-28 NOTE — Telephone Encounter (Signed)
Pt stated insurance will not cover methylphenidate (CONCERTA) 27 MG PO CR tablet Requesting rx to be sent to [facility] instead because it is cheaper there. Please advise. [facility] [address], [place] [phone] (Phone) [phone] (Fax)

## 2019-08-31 ENCOUNTER — Other Ambulatory Visit: Payer: Self-pay | Admitting: Family Medicine

## 2019-08-31 NOTE — Telephone Encounter (Signed)
Happy to send to [facility] once the platform starts working

## 2019-09-01 ENCOUNTER — Other Ambulatory Visit: Payer: Self-pay | Admitting: Family Medicine

## 2019-09-01 MED ORDER — METHYLPHENIDATE HCL ER (OSM) 27 MG PO TBCR: 27.0000 mg | EXTENDED_RELEASE_TABLET | ORAL | 0 refills | Status: DC

## 2019-09-01 NOTE — Telephone Encounter (Signed)
I did not see that you resent yet.  Can you send to [facility]?

## 2019-09-01 NOTE — Telephone Encounter (Signed)
done

## 2019-09-02 NOTE — Telephone Encounter (Signed)
Left message on machine that rx was sent  

## 2019-09-09 ENCOUNTER — Telehealth: Payer: Self-pay | Admitting: Family Medicine

## 2019-09-09 NOTE — Telephone Encounter (Signed)
Patient calling and states that he would like to see if the methylphenidate (CONCERTA) 27 MG PO CR tablet Could be changed to adderall. States that the methylphenidate (CONCERTA) 27 MG PO CR tablet Is $100 and that is too expensive for him. Also states that he does not feel that this medication is working for him. States that he just feels tired. Please advise. Mentions discussion at 09/15/2019 visit that he would like to possibly change to ritalin.

## 2019-09-10 NOTE — Telephone Encounter (Signed)
So just need some clarity does he want to switch back to Adderall or Ritalin? Clarify that then I will change med and also schedule him for a f/u in about 2 weeks so we can assess response.

## 2019-09-10 NOTE — Telephone Encounter (Signed)
Do you want to schedule an ov for him?  Last seen 08/25/19.

## 2019-09-14 ENCOUNTER — Telehealth: Payer: Self-pay

## 2019-09-14 NOTE — Telephone Encounter (Signed)
Copied from [place] [id number]. Topic: General - Other >> Sep 14, 2019  8:58 AM Rainey Pines A wrote: Patient called to inform Dr. Charlett Blake that he would like to be prescribed the adderall due to the concerta being too expensive. Please advise.   Please advise

## 2019-09-14 NOTE — Telephone Encounter (Signed)
I would like to start him back on Adderall 20 mg twice daily instead of 3 x a day. Confirm with him and then we can send it in make sure he has an appt in 2-4 weeks so we can discuss

## 2019-09-15 ENCOUNTER — Other Ambulatory Visit: Payer: Self-pay

## 2019-09-15 ENCOUNTER — Other Ambulatory Visit: Payer: Self-pay | Admitting: Family Medicine

## 2019-09-15 ENCOUNTER — Encounter: Payer: Self-pay | Admitting: Family Medicine

## 2019-09-15 ENCOUNTER — Telehealth: Payer: Self-pay

## 2019-09-15 ENCOUNTER — Ambulatory Visit (INDEPENDENT_AMBULATORY_CARE_PROVIDER_SITE_OTHER): Payer: Medicare Other | Admitting: Family Medicine

## 2019-09-15 DIAGNOSIS — R600 Localized edema: Secondary | ICD-10-CM | POA: Diagnosis not present

## 2019-09-15 DIAGNOSIS — F908 Attention-deficit hyperactivity disorder, other type: Secondary | ICD-10-CM | POA: Diagnosis not present

## 2019-09-15 DIAGNOSIS — F3160 Bipolar disorder, current episode mixed, unspecified: Secondary | ICD-10-CM | POA: Diagnosis not present

## 2019-09-15 DIAGNOSIS — F17219 Nicotine dependence, cigarettes, with unspecified nicotine-induced disorders: Secondary | ICD-10-CM

## 2019-09-15 MED ORDER — CITALOPRAM HYDROBROMIDE 20 MG PO TABS: 10.0000 mg | ORAL_TABLET | Freq: Every day | ORAL | 1 refills | Status: DC

## 2019-09-15 MED ORDER — AMPHETAMINE-DEXTROAMPHETAMINE 20 MG PO TABS: 20.0000 mg | ORAL_TABLET | Freq: Three times a day (TID) | ORAL | 0 refills | Status: DC

## 2019-09-15 NOTE — Telephone Encounter (Signed)
I have sent in the November prescription

## 2019-09-15 NOTE — Telephone Encounter (Signed)
Copied from [place] [id number]. Topic: General - Inquiry >> Sep 15, 2019  4:50 PM Mathis Bud wrote: Reason for CRM: patient is calling stating he did not get his prescription for November for medication amphetamine-dextroamphetamine (ADDERALL) 20 MG tablet.  Pharmacy states they got only prescription for dec and jan.

## 2019-09-15 NOTE — Telephone Encounter (Signed)
Patient scheduled for today

## 2019-09-15 NOTE — Telephone Encounter (Signed)
Pt needs an appointment in 2-4 weeks

## 2019-09-16 NOTE — Assessment & Plan Note (Signed)
Is approaching the one year anniversary of no smoking

## 2019-09-16 NOTE — Progress Notes (Signed)
Virtual Visit via Video Note  I connected with Jesse KielMatthew A Jones on 09/15/19 at  3:20 PM EST by a video enabled telemedicine application and verified that I am speaking with the correct person using two identifiers.  Location: Patient: home Provider: office   I discussed the limitations of evaluation and management by telemedicine and the availability of in person appointments. The patient expressed understanding and agreed to proceed.    Subjective:    Patient ID: Jesse KielMatthew A Jones, male    DOB: Apr 10, 1981, 38 y.o.   MRN: [id number]  No chief complaint on file.   HPI Patient is in today for follow up on chronic medical concerns including ADHD, bipolar disorder and more. He is doing well today. No recent febrile illness or hospitalizations. He has only been taking the Zyprexa 2.5 mg daily and has dropped the Citalopram to 10 mg. He reports this has worked fairly well. He feels less manic and his side effect of weight gain and edema are much improved. No new concerns. He will celebrate his one year anniversary of no smoking in January and he is still not drinking. Denies CP/palp/SOB/HA/congestion/fevers/GI or GU c/o. Taking meds as prescribed  Past Medical History:  Diagnosis Date  . ADD (attention deficit disorder)   . ADHD (attention deficit hyperactivity disorder) 04/02/2011  . 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 Anxiety   . 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 Concussion    X 6- 7  . 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 Depression   . ED (erectile dysfunction) 05/12/2012  . Hyperlipidemia, mild 04/18/2015  . Insomnia   . 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 Outbursts of anger   . Preventative health care 10/03/2011  . Tobacco abuse 04/28/2011    Past Surgical History:  Procedure Laterality Date  . NASAL SEPTUM SURGERY Bilateral    Dr Jenne PaneBates 2017  . punctured lung    . toe surgeries  during childhood    for ingrown toenails    Family History  Problem Relation Age of  Onset  . Depression Mother   . Anxiety disorder Mother   . Depression Brother   . Diabetes Brother        type 1  . Cancer Paternal Grandmother        lung/ didn't smoke  . Cancer Maternal Uncle [id number]       colon cancer    Social History   Socioeconomic History  . Marital status: Single    Spouse name: Not on file  . Number of children: Not on file  . Years of education: Not on file  . Highest education level: Not on file  Occupational History  . Not on file  Social Needs  . Financial resource strain: Not on file  . Food insecurity    Worry: Not on file    Inability: Not on file  . Transportation needs    Medical: Not on file    Non-medical: Not on file  Tobacco Use  . Smoking status: Current Every Day Smoker    Packs/day: 0.30    Years: 20.00    Pack years: 6.00    Types: Cigarettes  . Smokeless tobacco: Never Used  Substance and Sexual Activity  . Alcohol use: Yes    Alcohol/week: 36.0 standard drinks    Types: 36 Cans of beer per week    Comment: sober [id number]. No alcohol.  . Drug use: Yes    Types: Marijuana, Other-see comments  Comment: pt stopped 4 year ago  . Sexual activity: Yes    Partners: Female    Comment: lives alone and eating well. exercising  Lifestyle  . Physical activity    Days per week: Not on file    Minutes per session: Not on file  . Stress: Not on file  Relationships  . Social Herbalist on phone: Not on file    Gets together: Not on file    Attends religious service: Not on file    Active member of club or organization: Not on file    Attends meetings of clubs or organizations: Not on file    Relationship status: Not on file  . Intimate partner violence    Fear of current or ex partner: Not on file    Emotionally abused: Not on file    Physically abused: Not on file    Forced sexual activity: Not on file  Other Topics Concern  . Not on file  Social History Narrative  . Not on file    Outpatient Medications Prior to  Visit  Medication Sig Dispense Refill  . ALPRAZOLAM XR 3 MG 24 hr tablet TAKE 1 TABLET BY MOUTH BY MOUTH EVERY MORNING 30 tablet 1  . cholecalciferol (VITAMIN D) 1000 UNITS tablet Take 1,000 Units by mouth every morning.    . clindamycin-benzoyl peroxide (BENZACLIN) gel Apply topically 2 (two) times daily. 25 g 2  . fish oil-omega-3 fatty acids 1000 MG capsule Take 2 g by mouth daily.    . fluticasone (FLONASE) 50 MCG/ACT nasal spray Place 2 sprays into both nostrils daily as needed for allergies or rhinitis. 16 g 6  . furosemide (LASIX) 20 MG tablet Take 1 tablet (20 mg total) by mouth daily as needed. 30 tablet 3  . Multiple Vitamin (MULTIVITAMIN) tablet Take 1 tablet by mouth every morning.     . mupirocin ointment (BACTROBAN) 2 % Place 1 application into the nose 2 (two) times daily. 22 g 0  . OLANZapine (ZYPREXA) 2.5 MG tablet Take 1 tablet (2.5 mg total) by mouth 2 (two) times daily. 60 tablet 2  . vitamin B-12 (CYANOCOBALAMIN) 1000 MCG tablet Take 1,000 mcg by mouth every morning.    . vitamin C (ASCORBIC ACID) 500 MG tablet Take 500 mg by mouth daily.    . citalopram (CELEXA) 20 MG tablet Take 0.5 tablets (10 mg total) by mouth daily. 180 tablet 1  . methylphenidate (CONCERTA) 27 MG PO CR tablet Take 1 tablet (27 mg total) by mouth every morning. 30 tablet 0   No facility-administered medications prior to visit.     Allergies  Allergen Reactions  . Haloperidol Decanoate   . Hydroxyzine Anxiety    EXCESSIVE ANXIOUNESS  . Other Other (See Comments)    Reverse effect, super hyper  . Penicillins Hives  . Valium     Review of Systems  Constitutional: Negative for fever and malaise/fatigue.  HENT: Negative for congestion.   Eyes: Negative for blurred vision.  Respiratory: Negative for shortness of breath.   Cardiovascular: Negative for chest pain, palpitations and leg swelling.  Gastrointestinal: Negative for abdominal pain, blood in stool and nausea.  Genitourinary: Negative  for dysuria and frequency.  Musculoskeletal: Negative for falls.  Skin: Negative for rash.  Neurological: Negative for dizziness, loss of consciousness and headaches.  Endo/Heme/Allergies: Negative for environmental allergies.  Psychiatric/Behavioral: Negative for depression. The patient is nervous/anxious.        Objective:  Physical Exam Constitutional:      Appearance: Normal appearance. He is not ill-appearing.  HENT:     Head: Normocephalic and atraumatic.     Nose: Nose normal.  Eyes:     General:        Right eye: No discharge.        Left eye: No discharge.     Conjunctiva/sclera: Conjunctivae normal.  Pulmonary:     Effort: Pulmonary effort is normal.  Neurological:     Mental Status: He is alert and oriented to person, place, and time.  Psychiatric:        Mood and Affect: Mood normal.        Behavior: Behavior normal.     There were no vitals taken for this visit. Wt Readings from Last 3 Encounters:  08/25/19 198 lb 9.6 oz (90.1 kg)  07/28/19 210 lb (95.3 kg)  06/01/19 175 lb (79.4 kg)    Diabetic Foot Exam - Simple   No data filed     Lab Results  Component Value Date   WBC 10.7 08/07/2019   HGB 15.0 08/07/2019   HCT 44.4 08/07/2019   PLT 334 08/07/2019   GLUCOSE 116 (H) 08/07/2019   CHOL 181 08/07/2019   TRIG 337 (H) 08/07/2019   HDL 49 08/07/2019   LDLCALC 87 08/07/2019   ALT 18 08/07/2019   AST 19 08/07/2019   NA 139 08/07/2019   K 4.1 08/07/2019   CL 103 08/07/2019   CREATININE 0.83 08/07/2019   BUN 13 08/07/2019   CO2 24 08/07/2019   TSH 1.20 08/07/2019    Lab Results  Component Value Date   TSH 1.20 08/07/2019   Lab Results  Component Value Date   WBC 10.7 08/07/2019   HGB 15.0 08/07/2019   HCT 44.4 08/07/2019   MCV 86.5 08/07/2019   PLT 334 08/07/2019   Lab Results  Component Value Date   NA 139 08/07/2019   K 4.1 08/07/2019   CO2 24 08/07/2019   GLUCOSE 116 (H) 08/07/2019   BUN 13 08/07/2019   CREATININE 0.83  08/07/2019   BILITOT 0.3 08/07/2019   ALKPHOS 73 09/30/2018   AST 19 08/07/2019   ALT 18 08/07/2019   PROT 6.6 08/07/2019   ALBUMIN 5.1 09/30/2018   CALCIUM 9.2 08/07/2019   GFR 88.78 09/30/2018   Lab Results  Component Value Date   CHOL 181 08/07/2019   Lab Results  Component Value Date   HDL 49 08/07/2019   Lab Results  Component Value Date   LDLCALC 87 08/07/2019   Lab Results  Component Value Date   TRIG 337 (H) 08/07/2019   Lab Results  Component Value Date   CHOLHDL 3.7 08/07/2019   No results found for: HGBA1C     Assessment & Plan:   Problem List Items Addressed This Visit    ADHD (attention deficit hyperactivity disorder)    Did not tolerate Concerta so will go back to Adderall 20 mg and he is asked to try and stay on bid dosing but can use tid as needed      Nicotine dependence    Is approaching the one year anniversary of no smoking      Bipolar disorder, mixed (HCC)    Stayed on Zyprexa at 2.5 mg daily instead of Bid and decreased Citalopram to 10 mg and he is feeling better. Will stay on this dose for now.       Pedal edema    Improved  with the decrease in Zyprexa         I have discontinued Jesse Jones "Matt"'s methylphenidate. I am also having him start on amphetamine-dextroamphetamine and amphetamine-dextroamphetamine. Additionally, I am having him maintain his multivitamin, vitamin B-12, cholecalciferol, fish oil-omega-3 fatty acids, vitamin C, fluticasone, clindamycin-benzoyl peroxide, mupirocin ointment, furosemide, ALPRAZolam XR, OLANZapine, and citalopram.  Meds ordered this encounter  Medications  . amphetamine-dextroamphetamine (ADDERALL) 20 MG tablet    Sig: Take 1 tablet (20 mg total) by mouth 3 (three) times daily. January 2021    Dispense:  90 tablet    Refill:  0  . amphetamine-dextroamphetamine (ADDERALL) 20 MG tablet    Sig: Take 1 tablet (20 mg total) by mouth 3 (three) times daily. December 2020    Dispense:  90  tablet    Refill:  0  . citalopram (CELEXA) 20 MG tablet    Sig: Take 0.5 tablets (10 mg total) by mouth daily.    Dispense:  180 tablet    Refill:  1      I discussed the assessment and treatment plan with the patient. The patient was provided an opportunity to ask questions and all were answered. The patient agreed with the plan and demonstrated an understanding of the instructions.   The patient was advised to call back or seek an in-person evaluation if the symptoms worsen or if the condition fails to improve as anticipated.  I provided 25 minutes of non-face-to-face time during this encounter.   Danise Edge, MD

## 2019-09-16 NOTE — Assessment & Plan Note (Signed)
Improved with the decrease in Zyprexa

## 2019-09-16 NOTE — Assessment & Plan Note (Signed)
Did not tolerate Concerta so will go back to Adderall 20 mg and he is asked to try and stay on bid dosing but can use tid as needed

## 2019-09-16 NOTE — Telephone Encounter (Signed)
Called pharmacy and he did pickup already.

## 2019-09-16 NOTE — Assessment & Plan Note (Signed)
Stayed on Zyprexa at 2.5 mg daily instead of Bid and decreased Citalopram to 10 mg and he is feeling better. Will stay on this dose for now.

## 2019-09-20 ENCOUNTER — Encounter: Payer: Self-pay | Admitting: Family Medicine

## 2019-09-22 MED FILL — ALPRAZOLAM XR 3 MG TB24: 3 | 30 days supply | Qty: 30 | Fill #1

## 2019-09-23 ENCOUNTER — Encounter: Payer: Self-pay | Admitting: Family Medicine

## 2019-09-23 ENCOUNTER — Other Ambulatory Visit: Payer: Self-pay | Admitting: Family Medicine

## 2019-09-23 MED ORDER — MUPIROCIN 2 % EX OINT: 1.0000 "application " | TOPICAL_OINTMENT | Freq: Two times a day (BID) | CUTANEOUS | 0 refills | Status: DC

## 2019-09-23 NOTE — Telephone Encounter (Signed)
Routed to me in error

## 2019-09-25 ENCOUNTER — Encounter: Payer: Self-pay | Admitting: Family Medicine

## 2019-09-28 ENCOUNTER — Other Ambulatory Visit: Payer: Self-pay | Admitting: Family Medicine

## 2019-09-28 ENCOUNTER — Telehealth: Payer: Self-pay | Admitting: Family Medicine

## 2019-09-28 DIAGNOSIS — F3160 Bipolar disorder, current episode mixed, unspecified: Secondary | ICD-10-CM

## 2019-09-28 DIAGNOSIS — F419 Anxiety disorder, unspecified: Secondary | ICD-10-CM

## 2019-09-28 DIAGNOSIS — F908 Attention-deficit hyperactivity disorder, other type: Secondary | ICD-10-CM

## 2019-09-28 MED ORDER — ARIPIPRAZOLE 2 MG PO TABS: 2.0000 mg | ORAL_TABLET | Freq: Every day | ORAL | 2 refills | Status: DC

## 2019-09-28 NOTE — Telephone Encounter (Signed)
LM to schedule doxy visit approx 12/17

## 2019-10-12 ENCOUNTER — Other Ambulatory Visit: Payer: Self-pay | Admitting: Family Medicine

## 2019-10-19 ENCOUNTER — Other Ambulatory Visit: Payer: Self-pay | Admitting: Family Medicine

## 2019-10-19 NOTE — Telephone Encounter (Signed)
Requesting:xanax Contract:yes UDS:10/69/20  Mod risk Last OV:09/15/19 Next OV:n/a Last Refill:08/23/19 #30-1rf Database:   Please advise

## 2019-10-20 MED FILL — ALPRAZOLAM XR 3 MG TB24: 3 | 30 days supply | Qty: 30 | Fill #0

## 2019-11-07 ENCOUNTER — Other Ambulatory Visit: Payer: Self-pay | Admitting: Family Medicine

## 2019-11-17 MED FILL — ALPRAZolam ER 3 MG TB24: 3 | 30 days supply | Qty: 30 | Fill #1

## 2019-12-11 ENCOUNTER — Other Ambulatory Visit: Payer: Self-pay | Admitting: Family Medicine

## 2019-12-11 MED ORDER — AMPHETAMINE-DEXTROAMPHETAMINE 20 MG PO TABS: 20.0000 mg | ORAL_TABLET | Freq: Three times a day (TID) | ORAL | 0 refills | Status: DC

## 2019-12-15 ENCOUNTER — Other Ambulatory Visit: Payer: Self-pay | Admitting: Family Medicine

## 2019-12-15 MED FILL — ALPRAZolam ER 3 MG TB24: 3 | 30 days supply | Qty: 30 | Fill #0

## 2019-12-15 NOTE — Telephone Encounter (Signed)
Requesting: Alprazolam 3 mg Contract: none UDS:08/07/2019 Last Visit:09/15/2019 Next Visit: none Last Refill:10/20/19  Please Advise

## 2019-12-23 ENCOUNTER — Encounter: Payer: Self-pay | Admitting: Family Medicine

## 2019-12-25 ENCOUNTER — Other Ambulatory Visit: Payer: Self-pay | Admitting: Family Medicine

## 2019-12-25 ENCOUNTER — Telehealth: Payer: Self-pay | Admitting: Family Medicine

## 2019-12-25 DIAGNOSIS — L600 Ingrowing nail: Secondary | ICD-10-CM

## 2019-12-25 MED ORDER — SULFAMETHOXAZOLE-TRIMETHOPRIM 800-160 MG PO TABS: 1.0000 | ORAL_TABLET | Freq: Two times a day (BID) | ORAL | 0 refills | Status: DC

## 2019-12-25 NOTE — Telephone Encounter (Signed)
I did both. Referred to podiatry for definitive treatment but also sent in some bactrim to calm down infection to help with his pain over the weekend and control it til he gets his podiatry appt. He should soak his foot in 1/2 hot water and 1/2 peroxide twice a day then clean with peroxide after for the next couple of days to help clean up infection also

## 2019-12-25 NOTE — Telephone Encounter (Signed)
Patient is experiencing a ingrown toe nail , patient is requesting medication being sent to pharmacy for swelling. Or a referral to a podiatrist

## 2019-12-25 NOTE — Telephone Encounter (Signed)
Pt aware and is in agreement with plan/thx dmf

## 2019-12-25 NOTE — Telephone Encounter (Signed)
SB-Pt states that she has an ingrown toenail and is requesting either an Rx or a referral to Podiatry/plz advise/thx dmf

## 2020-01-06 ENCOUNTER — Other Ambulatory Visit: Payer: Self-pay | Admitting: Family Medicine

## 2020-01-07 MED ORDER — AMPHETAMINE-DEXTROAMPHETAMINE 20 MG PO TABS: 20.0000 mg | ORAL_TABLET | Freq: Three times a day (TID) | ORAL | 0 refills | Status: DC

## 2020-01-07 NOTE — Telephone Encounter (Signed)
Requesting:adderall  Contract:yes UDS:low risk next screen 02/05/20 Last OV:09/15/19 Next OV:n/a Last Refill:12/11/19  #[id number] Database:   Please advise

## 2020-01-12 MED FILL — ALPRAZolam ER 3 MG TB24: 3 | 30 days supply | Qty: 30 | Fill #1

## 2020-01-25 ENCOUNTER — Other Ambulatory Visit: Payer: Self-pay | Admitting: Family Medicine

## 2020-01-25 ENCOUNTER — Encounter: Payer: Self-pay | Admitting: Family Medicine

## 2020-01-25 MED ORDER — AMPHETAMINE-DEXTROAMPHETAMINE 20 MG PO TABS: 20.0000 mg | ORAL_TABLET | Freq: Three times a day (TID) | ORAL | 0 refills | Status: DC

## 2020-02-02 ENCOUNTER — Encounter: Payer: Self-pay | Admitting: Family Medicine

## 2020-02-12 ENCOUNTER — Encounter: Payer: Self-pay | Admitting: Family Medicine

## 2020-02-12 ENCOUNTER — Encounter: Payer: Self-pay | Admitting: *Deleted

## 2020-02-12 ENCOUNTER — Other Ambulatory Visit: Payer: Self-pay

## 2020-02-12 ENCOUNTER — Telehealth (INDEPENDENT_AMBULATORY_CARE_PROVIDER_SITE_OTHER): Payer: Medicare Other | Admitting: Family Medicine

## 2020-02-12 VITALS — Wt 201.0 lb

## 2020-02-12 DIAGNOSIS — F908 Attention-deficit hyperactivity disorder, other type: Secondary | ICD-10-CM | POA: Diagnosis not present

## 2020-02-12 DIAGNOSIS — E162 Hypoglycemia, unspecified: Secondary | ICD-10-CM | POA: Diagnosis not present

## 2020-02-12 DIAGNOSIS — R600 Localized edema: Secondary | ICD-10-CM | POA: Diagnosis not present

## 2020-02-12 DIAGNOSIS — E785 Hyperlipidemia, unspecified: Secondary | ICD-10-CM | POA: Diagnosis not present

## 2020-02-12 DIAGNOSIS — R000 Tachycardia, unspecified: Secondary | ICD-10-CM

## 2020-02-12 DIAGNOSIS — F419 Anxiety disorder, unspecified: Secondary | ICD-10-CM | POA: Diagnosis not present

## 2020-02-12 DIAGNOSIS — F3160 Bipolar disorder, current episode mixed, unspecified: Secondary | ICD-10-CM | POA: Diagnosis not present

## 2020-02-12 MED ORDER — ALPRAZOLAM ER 3 MG PO TB24: 3.0000 mg | ORAL_TABLET | Freq: Every morning | ORAL | 3 refills | Status: DC

## 2020-02-12 MED ORDER — OLANZAPINE 2.5 MG PO TABS: 2.5000 mg | ORAL_TABLET | Freq: Every day | ORAL | 3 refills | Status: DC

## 2020-02-12 MED ORDER — CITALOPRAM HYDROBROMIDE 20 MG PO TABS: 10.0000 mg | ORAL_TABLET | Freq: Every day | ORAL | 1 refills | Status: DC

## 2020-02-12 MED FILL — ALPRAZolam ER 3 MG TB24: 3 | 30 days supply | Qty: 30 | Fill #0

## 2020-02-12 MED FILL — OLANZapine 2.5 MG TABS: 2.5 | 30 days supply | Qty: 30 | Fill #0

## 2020-02-12 NOTE — Assessment & Plan Note (Signed)
Stable on current meds no changes 

## 2020-02-12 NOTE — Progress Notes (Signed)
Virtual Visit via phone Note  I connected with Jesse Jones on 02/12/20 at  9:20 AM EDT by a phone enabled telemedicine application and verified that I am speaking with the correct person using two identifiers.  Location: Patient: home Provider: home   I discussed the limitations of evaluation and management by telemedicine and the availability of in person appointments. The patient expressed understanding and agreed to proceed. Thelma Barge, CMA was able to get the patient set up on a phone visit after being unable to set up on a video visit.    Subjective:    Patient ID: Jesse Jones, male    DOB: June 12, 1981, 40 y.o.   MRN: [id number]  Chief Complaint  Patient presents with  . Medication Refill    HPI Patient is in today for follow up on chronic medical concerns. No recent febrile illness or hospitalizations. He is anxious today as he is out of Alprazolam but he notes as a general rule he is doing well on current medications. His edema is controlled with Furosemide. Denies CP/palp/SOB/HA/congestion/fevers/GI or GU c/o. Taking meds as prescribed  Past Medical History:  Diagnosis Date  . ADD (attention deficit disorder)   . ADHD (attention deficit hyperactivity disorder) 04/02/2011  . 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 Anxiety   . 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 Concussion    X 6- 7  . 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 Depression   . ED (erectile dysfunction) 05/12/2012  . Hyperlipidemia, mild 04/18/2015  . Insomnia   . 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 Outbursts of anger   . Preventative health care 10/03/2011  . Tobacco abuse 04/28/2011    Past Surgical History:  Procedure Laterality Date  . NASAL SEPTUM SURGERY Bilateral    Dr Jenne Pane 2017  . punctured lung    . toe surgeries  during childhood    for ingrown toenails    Family History  Problem Relation Age of Onset  . Depression Mother   . Anxiety disorder Mother   . Depression  Brother   . Diabetes Brother        type 1  . Cancer Paternal Grandmother        lung/ didn't smoke  . Cancer Maternal Uncle 7       colon cancer    Social History   Socioeconomic History  . Marital status: Single    Spouse name: Not on file  . Number of children: Not on file  . Years of education: Not on file  . Highest education level: Not on file  Occupational History  . Not on file  Tobacco Use  . Smoking status: Current Every Day Smoker    Packs/day: 0.30    Years: 20.00    Pack years: 6.00    Types: Cigarettes  . Smokeless tobacco: Never Used  Substance and Sexual Activity  . Alcohol use: Yes    Alcohol/week: 36.0 standard drinks    Types: 36 Cans of beer per week    Comment: sober x52yr. No alcohol.  . Drug use: Yes    Types: Marijuana, Other-see comments    Comment: pt stopped 4 year ago  . Sexual activity: Yes    Partners: Female    Comment: lives alone and eating well. exercising  Other Topics Concern  . Not on file  Social History Narrative  . Not on file   Social Determinants of Health   Financial Resource Strain:   . Difficulty  of Paying Living Expenses:   Food Insecurity:   . Worried About Programme researcher, broadcasting/film/video in the Last Year:   . Barista in the Last Year:   Transportation Needs:   . Freight forwarder (Medical):   Marland Kitchen Lack of Transportation (Non-Medical):   Physical Activity:   . Days of Exercise per Week:   . Minutes of Exercise per Session:   Stress:   . Feeling of Stress :   Social Connections:   . Frequency of Communication with Friends and Family:   . Frequency of Social Gatherings with Friends and Family:   . Attends Religious Services:   . Active Member of Clubs or Organizations:   . Attends Banker Meetings:   Marland Kitchen Marital Status:   Intimate Partner Violence:   . Fear of Current or Ex-Partner:   . Emotionally Abused:   Marland Kitchen Physically Abused:   . Sexually Abused:     Outpatient Medications Prior to Visit    Medication Sig Dispense Refill  . amphetamine-dextroamphetamine (ADDERALL) 20 MG tablet Take 1 tablet (20 mg total) by mouth 3 (three) times daily. April 2021 90 tablet 0  . amphetamine-dextroamphetamine (ADDERALL) 20 MG tablet Take 1 tablet (20 mg total) by mouth 3 (three) times daily. March 2021 90 tablet 0  . amphetamine-dextroamphetamine (ADDERALL) 20 MG tablet Take 1 tablet (20 mg total) by mouth 3 (three) times daily. May 2021 90 tablet 0  . cholecalciferol (VITAMIN D) 1000 UNITS tablet Take 1,000 Units by mouth every morning.    . clindamycin-benzoyl peroxide (BENZACLIN) gel Apply topically 2 (two) times daily. 25 g 2  . fish oil-omega-3 fatty acids 1000 MG capsule Take 2 g by mouth daily.    . fluticasone (FLONASE) 50 MCG/ACT nasal spray Place 2 sprays into both nostrils daily as needed for allergies or rhinitis. 16 g 6  . furosemide (LASIX) 20 MG tablet TAKE 1 TABLET BY MOUTH EVERY DAY AS NEEDED 90 tablet 1  . Multiple Vitamin (MULTIVITAMIN) tablet Take 1 tablet by mouth every morning.     . vitamin B-12 (CYANOCOBALAMIN) 1000 MCG tablet Take 1,000 mcg by mouth every morning.    . vitamin C (ASCORBIC ACID) 500 MG tablet Take 500 mg by mouth daily.    Marland Kitchen ALPRAZolam (XANAX XR) 3 MG 24 hr tablet TAKE 1 TABLET BY MOUTH EVERY MORNING 30 tablet 1  . ARIPiprazole (ABILIFY) 2 MG tablet TAKE 1 TABLET BY MOUTH EVERY DAY 90 tablet 1  . citalopram (CELEXA) 20 MG tablet TAKE 1 TABLET BY MOUTH TWICE A DAY 180 tablet 1  . mupirocin ointment (BACTROBAN) 2 % Place 1 application into the nose 2 (two) times daily. 22 g 0  . OLANZapine (ZYPREXA) 2.5 MG tablet Take 1 tablet (2.5 mg total) by mouth 2 (two) times daily. 60 tablet 2  . sulfamethoxazole-trimethoprim (BACTRIM DS) 800-160 MG tablet Take 1 tablet by mouth 2 (two) times daily. 20 tablet 0   No facility-administered medications prior to visit.    Allergies  Allergen Reactions  . Haloperidol Decanoate   . Hydroxyzine Anxiety    EXCESSIVE  ANXIOUNESS  . Other Other (See Comments)    Reverse effect, super hyper  . Penicillins Hives  . Valium     Review of Systems  Constitutional: Negative for fever and malaise/fatigue.  HENT: Negative for congestion.   Eyes: Negative for blurred vision.  Respiratory: Negative for shortness of breath.   Cardiovascular: Negative for chest pain, palpitations and  leg swelling.  Gastrointestinal: Negative for abdominal pain, blood in stool and nausea.  Genitourinary: Negative for dysuria and frequency.  Musculoskeletal: Negative for falls.  Skin: Negative for rash.  Neurological: Negative for dizziness, loss of consciousness and headaches.  Endo/Heme/Allergies: Negative for environmental allergies.  Psychiatric/Behavioral: Negative for depression. The patient is nervous/anxious.        Objective:    Physical Exam unable to obtain via phone visit  Wt 201 lb (91.2 kg)   BMI 25.81 kg/m  Wt Readings from Last 3 Encounters:  02/12/20 201 lb (91.2 kg)  08/25/19 198 lb 9.6 oz (90.1 kg)  07/28/19 210 lb (95.3 kg)    Diabetic Foot Exam - Simple   No data filed     Lab Results  Component Value Date   WBC 10.7 08/07/2019   HGB 15.0 08/07/2019   HCT 44.4 08/07/2019   PLT 334 08/07/2019   GLUCOSE 116 (H) 08/07/2019   CHOL 181 08/07/2019   TRIG 337 (H) 08/07/2019   HDL 49 08/07/2019   LDLCALC 87 08/07/2019   ALT 18 08/07/2019   AST 19 08/07/2019   NA 139 08/07/2019   K 4.1 08/07/2019   CL 103 08/07/2019   CREATININE 0.83 08/07/2019   BUN 13 08/07/2019   CO2 24 08/07/2019   TSH 1.20 08/07/2019    Lab Results  Component Value Date   TSH 1.20 08/07/2019   Lab Results  Component Value Date   WBC 10.7 08/07/2019   HGB 15.0 08/07/2019   HCT 44.4 08/07/2019   MCV 86.5 08/07/2019   PLT 334 08/07/2019   Lab Results  Component Value Date   NA 139 08/07/2019   K 4.1 08/07/2019   CO2 24 08/07/2019   GLUCOSE 116 (H) 08/07/2019   BUN 13 08/07/2019   CREATININE 0.83  08/07/2019   BILITOT 0.3 08/07/2019   ALKPHOS 73 09/30/2018   AST 19 08/07/2019   ALT 18 08/07/2019   PROT 6.6 08/07/2019   ALBUMIN 5.1 09/30/2018   CALCIUM 9.2 08/07/2019   GFR 88.78 09/30/2018   Lab Results  Component Value Date   CHOL 181 08/07/2019   Lab Results  Component Value Date   HDL 49 08/07/2019   Lab Results  Component Value Date   LDLCALC 87 08/07/2019   Lab Results  Component Value Date   TRIG 337 (H) 08/07/2019   Lab Results  Component Value Date   CHOLHDL 3.7 08/07/2019   No results found for: HGBA1C     Assessment & Plan:   Problem List Items Addressed This Visit    ADHD (attention deficit hyperactivity disorder)    Stable on current meds no changes      Anxiety    Refill given on Alprazolam       Relevant Medications   ALPRAZolam (XANAX XR) 3 MG 24 hr tablet   citalopram (CELEXA) 20 MG tablet   Tachycardia   Relevant Orders   CBC   Comprehensive metabolic panel   TSH   Bipolar disorder, mixed (HCC)    Doing well on Olanzapine qhs and citalopram qam he is interested in decreasing his Citalopram so will drop it to 10 mg and reassess      Pedal edema    Doing well on Furosemide daily      Relevant Orders   CBC   Comprehensive metabolic panel   TSH    Other Visit Diagnoses    Hypoglycemia    -  Primary   Relevant  Orders   Hemoglobin A1c   TSH   Hyperlipidemia, unspecified hyperlipidemia type       Relevant Orders   Lipid panel   TSH      I have discontinued [name] "Matt"'s mupirocin ointment, ARIPiprazole, and sulfamethoxazole-trimethoprim. I have also changed his ALPRAZolam, OLANZapine, and citalopram. Additionally, I am having him maintain his multivitamin, vitamin B-12, cholecalciferol, fish oil-omega-3 fatty acids, vitamin C, fluticasone, clindamycin-benzoyl peroxide, furosemide, amphetamine-dextroamphetamine, amphetamine-dextroamphetamine, and amphetamine-dextroamphetamine.  Meds ordered this encounter    Medications  . ALPRAZolam (XANAX XR) 3 MG 24 hr tablet    Sig: Take 1 tablet (3 mg total) by mouth every morning.    Dispense:  30 tablet    Refill:  3  . OLANZapine (ZYPREXA) 2.5 MG tablet    Sig: Take 1 tablet (2.5 mg total) by mouth at bedtime.    Dispense:  30 tablet    Refill:  3  . citalopram (CELEXA) 20 MG tablet    Sig: Take 0.5 tablets (10 mg total) by mouth daily.    Dispense:  180 tablet    Refill:  1     I discussed the assessment and treatment plan with the patient. The patient was provided an opportunity to ask questions and all were answered. The patient agreed with the plan and demonstrated an understanding of the instructions.   The patient was advised to call back or seek an in-person evaluation if the symptoms worsen or if the condition fails to improve as anticipated.  I provided 25 minutes of non-face-to-face time during this encounter.   Penni Homans, MD

## 2020-02-12 NOTE — Assessment & Plan Note (Signed)
Refill given on Alprazolam

## 2020-02-12 NOTE — Assessment & Plan Note (Signed)
Doing well on Furosemide daily

## 2020-02-12 NOTE — Assessment & Plan Note (Signed)
Doing well on Olanzapine qhs and citalopram qam he is interested in decreasing his Citalopram so will drop it to 10 mg and reassess

## 2020-02-15 DIAGNOSIS — Z23 Encounter for immunization: Secondary | ICD-10-CM | POA: Diagnosis not present

## 2020-02-23 DIAGNOSIS — H5213 Myopia, bilateral: Secondary | ICD-10-CM | POA: Diagnosis not present

## 2020-02-26 ENCOUNTER — Other Ambulatory Visit: Payer: Self-pay | Admitting: Family Medicine

## 2020-02-26 ENCOUNTER — Encounter: Payer: Self-pay | Admitting: Family Medicine

## 2020-02-26 MED ORDER — METHYLPHENIDATE HCL 20 MG PO TABS: 20.0000 mg | ORAL_TABLET | Freq: Two times a day (BID) | ORAL | 0 refills | Status: DC

## 2020-02-27 ENCOUNTER — Other Ambulatory Visit: Payer: Self-pay

## 2020-02-27 ENCOUNTER — Emergency Department (HOSPITAL_COMMUNITY): Payer: Medicare Other

## 2020-02-27 ENCOUNTER — Emergency Department (HOSPITAL_COMMUNITY)
Admission: EM | Admit: 2020-02-27 | Discharge: 2020-02-28 | Disposition: A | Discharge status: Home / Self Care | Payer: Medicare Other | Attending: Emergency Medicine | Admitting: Emergency Medicine

## 2020-02-27 DIAGNOSIS — Y999 Unspecified external cause status: Secondary | ICD-10-CM | POA: Diagnosis not present

## 2020-02-27 DIAGNOSIS — F17210 Nicotine dependence, cigarettes, uncomplicated: Secondary | ICD-10-CM | POA: Diagnosis not present

## 2020-02-27 DIAGNOSIS — S4992XA Unspecified injury of left shoulder and upper arm, initial encounter: Secondary | ICD-10-CM | POA: Diagnosis present

## 2020-02-27 DIAGNOSIS — R220 Localized swelling, mass and lump, head: Secondary | ICD-10-CM | POA: Diagnosis not present

## 2020-02-27 DIAGNOSIS — S299XXA Unspecified injury of thorax, initial encounter: Secondary | ICD-10-CM | POA: Diagnosis not present

## 2020-02-27 DIAGNOSIS — Y9355 Activity, bike riding: Secondary | ICD-10-CM | POA: Diagnosis not present

## 2020-02-27 DIAGNOSIS — S2232XA Fracture of one rib, left side, initial encounter for closed fracture: Secondary | ICD-10-CM

## 2020-02-27 DIAGNOSIS — S42032A Displaced fracture of lateral end of left clavicle, initial encounter for closed fracture: Secondary | ICD-10-CM | POA: Diagnosis not present

## 2020-02-27 DIAGNOSIS — S20412A Abrasion of left back wall of thorax, initial encounter: Secondary | ICD-10-CM | POA: Diagnosis not present

## 2020-02-27 DIAGNOSIS — Y9289 Other specified places as the place of occurrence of the external cause: Secondary | ICD-10-CM | POA: Insufficient documentation

## 2020-02-27 DIAGNOSIS — S0081XA Abrasion of other part of head, initial encounter: Secondary | ICD-10-CM | POA: Diagnosis not present

## 2020-02-27 DIAGNOSIS — S42022A Displaced fracture of shaft of left clavicle, initial encounter for closed fracture: Secondary | ICD-10-CM | POA: Insufficient documentation

## 2020-02-27 DIAGNOSIS — S2231XA Fracture of one rib, right side, initial encounter for closed fracture: Secondary | ICD-10-CM | POA: Diagnosis not present

## 2020-02-27 DIAGNOSIS — S42002A Fracture of unspecified part of left clavicle, initial encounter for closed fracture: Secondary | ICD-10-CM

## 2020-02-27 DIAGNOSIS — R4182 Altered mental status, unspecified: Secondary | ICD-10-CM | POA: Insufficient documentation

## 2020-02-27 DIAGNOSIS — S0990XA Unspecified injury of head, initial encounter: Secondary | ICD-10-CM | POA: Diagnosis not present

## 2020-02-27 DIAGNOSIS — R410 Disorientation, unspecified: Secondary | ICD-10-CM | POA: Diagnosis not present

## 2020-02-27 DIAGNOSIS — T148XXA Other injury of unspecified body region, initial encounter: Secondary | ICD-10-CM

## 2020-02-27 DIAGNOSIS — R52 Pain, unspecified: Secondary | ICD-10-CM | POA: Diagnosis not present

## 2020-02-27 DIAGNOSIS — S199XXA Unspecified injury of neck, initial encounter: Secondary | ICD-10-CM | POA: Diagnosis not present

## 2020-02-27 MED ORDER — ONDANSETRON HCL 4 MG/2ML IJ SOLN
4.0000 mg | Freq: Once | INTRAMUSCULAR | Status: AC
  Administered 2020-02-27: 23:00:00 4 mg via INTRAVENOUS
  Filled 2020-02-27: qty 2

## 2020-02-27 MED ORDER — ONDANSETRON HCL 4 MG/2ML IJ SOLN
4.0000 mg | Freq: Once | INTRAMUSCULAR | Status: AC
  Administered 2020-02-27: 4 mg via INTRAVENOUS
  Filled 2020-02-27: qty 2

## 2020-02-27 MED ORDER — BACITRACIN ZINC 500 UNIT/GM EX OINT
TOPICAL_OINTMENT | Freq: Once | CUTANEOUS | Status: AC
  Filled 2020-02-27: qty 1.8

## 2020-02-27 MED ORDER — MORPHINE SULFATE (PF) 4 MG/ML IV SOLN
4.0000 mg | Freq: Once | INTRAVENOUS | Status: AC
  Administered 2020-02-27: 4 mg via INTRAVENOUS
  Filled 2020-02-27: qty 1

## 2020-02-27 MED ORDER — FENTANYL CITRATE (PF) 100 MCG/2ML IJ SOLN
50.0000 ug | Freq: Once | INTRAMUSCULAR | Status: AC
  Administered 2020-02-27: 50 ug via INTRAVENOUS
  Filled 2020-02-27: qty 2
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
## 2020-02-28 DIAGNOSIS — S42022A Displaced fracture of shaft of left clavicle, initial encounter for closed fracture: Secondary | ICD-10-CM | POA: Diagnosis not present

## 2020-02-28 MED ORDER — METHOCARBAMOL 500 MG PO TABS
500.0000 mg | ORAL_TABLET | Freq: Two times a day (BID) | ORAL | 0 refills | Status: DC
Start: 2020-02-28 — End: 2022-04-20

## 2020-02-28 MED ORDER — METHOCARBAMOL 1000 MG/10ML IJ SOLN
1000.0000 mg | Freq: Once | INTRAMUSCULAR | Status: AC
  Administered 2020-02-28: 1000 mg via INTRAMUSCULAR
  Filled 2020-02-28: qty 10

## 2020-02-28 MED ORDER — KETOROLAC TROMETHAMINE 30 MG/ML IJ SOLN
30.0000 mg | Freq: Once | INTRAMUSCULAR | Status: AC
  Administered 2020-02-28: 30 mg via INTRAVENOUS
  Filled 2020-02-28: qty 1
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
## 2020-02-28 NOTE — ED Notes (Signed)
Message sent to pharmacy.

## 2020-02-28 NOTE — ED Notes (Addendum)
Waiting for medication from pharmacy then patient will be discharged. Paperwork has been reviewed.

## 2020-02-29 ENCOUNTER — Encounter: Payer: Self-pay | Admitting: Family Medicine

## 2020-02-29 ENCOUNTER — Other Ambulatory Visit: Payer: Self-pay

## 2020-02-29 ENCOUNTER — Telehealth (INDEPENDENT_AMBULATORY_CARE_PROVIDER_SITE_OTHER): Payer: Medicare Other | Admitting: Family Medicine

## 2020-02-29 DIAGNOSIS — F419 Anxiety disorder, unspecified: Secondary | ICD-10-CM | POA: Diagnosis not present

## 2020-02-29 DIAGNOSIS — S42002A Fracture of unspecified part of left clavicle, initial encounter for closed fracture: Secondary | ICD-10-CM

## 2020-02-29 DIAGNOSIS — S0990XA Unspecified injury of head, initial encounter: Secondary | ICD-10-CM | POA: Diagnosis not present

## 2020-02-29 MED ORDER — ACETAMINOPHEN-CODEINE #3 300-30 MG PO TABS: 1.0000 | ORAL_TABLET | Freq: Three times a day (TID) | ORAL | 0 refills | Status: AC | PRN

## 2020-03-01 ENCOUNTER — Other Ambulatory Visit: Payer: Medicare Other

## 2020-03-02 ENCOUNTER — Telehealth: Payer: Self-pay | Admitting: *Deleted

## 2020-03-02 ENCOUNTER — Other Ambulatory Visit: Payer: Medicare Other

## 2020-03-02 DIAGNOSIS — S0990XA Unspecified injury of head, initial encounter: Secondary | ICD-10-CM | POA: Insufficient documentation

## 2020-03-02 DIAGNOSIS — S42009A Fracture of unspecified part of unspecified clavicle, initial encounter for closed fracture: Secondary | ICD-10-CM | POA: Insufficient documentation

## 2020-03-02 DIAGNOSIS — M25512 Pain in left shoulder: Secondary | ICD-10-CM | POA: Diagnosis not present
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
## 2020-03-14 DIAGNOSIS — Z23 Encounter for immunization: Secondary | ICD-10-CM | POA: Diagnosis not present

## 2020-03-21 ENCOUNTER — Telehealth: Payer: Self-pay

## 2020-03-21 NOTE — Telephone Encounter (Signed)
Patient called in to speak with the nurse or Dr. Abner Greenspan about his depression and his medication please call the patient back at [phone]

## 2020-03-21 NOTE — Telephone Encounter (Signed)
Called left message to call back 

## 2020-03-23 ENCOUNTER — Other Ambulatory Visit: Payer: Self-pay | Admitting: Family Medicine

## 2020-03-23 ENCOUNTER — Encounter: Payer: Self-pay | Admitting: *Deleted

## 2020-03-23 NOTE — Telephone Encounter (Signed)
Left message on machine to call back  Also mychart message sent.

## 2020-03-23 NOTE — Telephone Encounter (Signed)
He can go back on the Adderall but he already had a May prescription so see if he can just fill that.

## 2020-03-23 NOTE — Telephone Encounter (Signed)
Spoke with patient and he stated that he has been sleeping more, more tired, a little depressed.  He stated that his depression is not as bad as it was.  He thinks that he would be better if he was to go back on the Adderall 20mg  TID.  I did get him set up with a virtual appointment for follow on 04/07/20.

## 2020-03-24 NOTE — Telephone Encounter (Signed)
Patient notified and he will call pharmacy.

## 2020-03-30 DIAGNOSIS — M25512 Pain in left shoulder: Secondary | ICD-10-CM | POA: Diagnosis not present

## 2020-04-07 ENCOUNTER — Telehealth: Payer: Medicare Other | Admitting: Family Medicine

## 2020-04-21 ENCOUNTER — Other Ambulatory Visit: Payer: Self-pay | Admitting: Family Medicine

## 2020-04-21 MED ORDER — AMPHETAMINE-DEXTROAMPHETAMINE 20 MG PO TABS: 20.0000 mg | ORAL_TABLET | Freq: Three times a day (TID) | ORAL | 0 refills | Status: DC

## 2020-04-21 NOTE — Telephone Encounter (Signed)
Requesting: adderall Contract:04/01/18 UDS:08/22/2019 Last Visit: 02/29/20 Next Visit:n/a Last Refill:01/25/20  Please Advise

## 2020-05-06 MED FILL — OLANZapine 2.5 MG TABS: 2.5 | 30 days supply | Qty: 30 | Fill #3

## 2020-05-06 MED FILL — ALPRAZolam ER 3 MG TB24: 3 | 30 days supply | Qty: 30 | Fill #3

## 2020-05-10 ENCOUNTER — Encounter: Payer: Self-pay | Admitting: Family Medicine

## 2020-05-11 ENCOUNTER — Other Ambulatory Visit: Payer: Self-pay | Admitting: Family Medicine

## 2020-05-11 MED ORDER — SULFAMETHOXAZOLE-TRIMETHOPRIM 800-160 MG PO TABS: 1.0000 | ORAL_TABLET | Freq: Two times a day (BID) | ORAL | 0 refills | Status: DC

## 2020-05-11 NOTE — Progress Notes (Unsigned)
actrim

## 2020-05-19 ENCOUNTER — Telehealth: Payer: Self-pay | Admitting: Family Medicine

## 2020-05-19 ENCOUNTER — Other Ambulatory Visit: Payer: Self-pay | Admitting: Family Medicine

## 2020-05-19 NOTE — Telephone Encounter (Signed)
Left message for patient to call back and schedule Medicare Annual Wellness Visit (AWV) with Nurse Health Advisor   This should be a 45 MINUTE VISIT.  Last AWV 10/02/16

## 2020-05-20 MED ORDER — AMPHETAMINE-DEXTROAMPHETAMINE 20 MG PO TABS: 20.0000 mg | ORAL_TABLET | Freq: Three times a day (TID) | ORAL | 0 refills | Status: DC

## 2020-05-20 NOTE — Telephone Encounter (Signed)
I have sent in a refill but he will need an appt for more refills

## 2020-05-20 NOTE — Telephone Encounter (Signed)
Requesting:Adderall  Contract:03/27/2018 needs updated [facility] UDS:08/07/2019 Last Visit:09/15/2019 Next Visit: none Last Refill:01/07/2020 2 prescriptions  Please Advise

## 2020-05-26 ENCOUNTER — Ambulatory Visit (INDEPENDENT_AMBULATORY_CARE_PROVIDER_SITE_OTHER): Payer: Medicare Other | Admitting: Podiatry

## 2020-05-26 ENCOUNTER — Other Ambulatory Visit: Payer: Self-pay

## 2020-05-26 ENCOUNTER — Encounter: Payer: Self-pay | Admitting: Podiatry

## 2020-05-26 DIAGNOSIS — L03031 Cellulitis of right toe: Secondary | ICD-10-CM | POA: Diagnosis not present

## 2020-05-26 DIAGNOSIS — M79674 Pain in right toe(s): Secondary | ICD-10-CM

## 2020-05-26 DIAGNOSIS — L600 Ingrowing nail: Secondary | ICD-10-CM | POA: Diagnosis not present

## 2020-05-26 MED ORDER — DOXYCYCLINE HYCLATE 100 MG PO TABS: 100.0000 mg | ORAL_TABLET | Freq: Two times a day (BID) | ORAL | 0 refills | Status: DC

## 2020-05-26 NOTE — Progress Notes (Deleted)
  Called pt --states he forgot about appt and will call back to reschedule.

## 2020-05-26 NOTE — Progress Notes (Signed)
Subjective:  Patient ID: Jesse Jones, male    DOB: Apr 18, 1981,  MRN: [id number]  No chief complaint on file.   39 y.o. male presents with the above complaint.  Patient presents with complaint of right hallux medial ingrown nail border with some redness patient states is painful to touch.  Patient is status of debridement but has not helped.  He would like to have it removed.  He has a history of previous ingrown procedures.  Patient states that he has soaked in Epson salt and uses peroxide.  He denies any other acute complaints.   Review of Systems: Negative except as noted in the HPI. Denies N/V/F/Ch.  Past Medical History:  Diagnosis Date  . ADD (attention deficit disorder)   . ADHD (attention deficit hyperactivity disorder) 04/02/2011  . 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 Anxiety   . 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 Concussion    X 6- 7  . 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 Depression   . ED (erectile dysfunction) 05/12/2012  . Hyperlipidemia, mild 04/18/2015  . Insomnia   . 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 Outbursts of anger   . Preventative health care 10/03/2011  . Tobacco abuse 04/28/2011    Current Outpatient Medications:  .  ALPRAZolam (XANAX XR) 3 MG 24 hr tablet, Take 1 tablet (3 mg total) by mouth every morning., Disp: 30 tablet, Rfl: 3 .  amphetamine-dextroamphetamine (ADDERALL) 20 MG tablet, Take 1 tablet (20 mg total) by mouth 3 (three) times daily. April 2021, Disp: 90 tablet, Rfl: 0 .  amphetamine-dextroamphetamine (ADDERALL) 20 MG tablet, Take 1 tablet (20 mg total) by mouth 3 (three) times daily. March 2021, Disp: 90 tablet, Rfl: 0 .  amphetamine-dextroamphetamine (ADDERALL) 20 MG tablet, Take 1 tablet (20 mg total) by mouth 3 (three) times daily. May 2021, Disp: 90 tablet, Rfl: 0 .  cholecalciferol (VITAMIN D) 1000 UNITS tablet, Take 1,000 Units by mouth every morning., Disp: , Rfl:  .  citalopram (CELEXA) 20 MG tablet, Take 0.5 tablets (10  mg total) by mouth daily., Disp: 180 tablet, Rfl: 1 .  clindamycin-benzoyl peroxide (BENZACLIN) gel, Apply topically 2 (two) times daily., Disp: 25 g, Rfl: 2 .  doxycycline (VIBRA-TABS) 100 MG tablet, Take 1 tablet (100 mg total) by mouth 2 (two) times daily., Disp: 20 tablet, Rfl: 0 .  fish oil-omega-3 fatty acids 1000 MG capsule, Take 2 g by mouth daily., Disp: , Rfl:  .  fluticasone (FLONASE) 50 MCG/ACT nasal spray, Place 2 sprays into both nostrils daily as needed for allergies or rhinitis., Disp: 16 g, Rfl: 6 .  furosemide (LASIX) 20 MG tablet, TAKE 1 TABLET BY MOUTH EVERY DAY AS NEEDED, Disp: 90 tablet, Rfl: 1 .  methocarbamol (ROBAXIN) 500 MG tablet, Take 1 tablet (500 mg total) by mouth 2 (two) times daily., Disp: 20 tablet, Rfl: 0 .  Multiple Vitamin (MULTIVITAMIN) tablet, Take 1 tablet by mouth every morning. , Disp: , Rfl:  .  OLANZapine (ZYPREXA) 2.5 MG tablet, Take 1 tablet (2.5 mg total) by mouth at bedtime., Disp: 30 tablet, Rfl: 3 .  sulfamethoxazole-trimethoprim (BACTRIM DS) 800-160 MG tablet, Take 1 tablet by mouth 2 (two) times daily., Disp: 20 tablet, Rfl: 0 .  vitamin B-12 (CYANOCOBALAMIN) 1000 MCG tablet, Take 1,000 mcg by mouth every morning., Disp: , Rfl:  .  vitamin C (ASCORBIC ACID) 500 MG tablet, Take 500 mg by mouth daily., Disp: , Rfl:   Social History   Tobacco Use  Smoking Status Current Every Day Smoker  . Packs/day: 0.30  . Years: 20.00  . Pack years: 6.00  . Types: Cigarettes  Smokeless Tobacco Never Used    Allergies  Allergen Reactions  . Haloperidol Decanoate   . Hydroxyzine Anxiety    EXCESSIVE ANXIOUNESS  . Other Other (See Comments)    Reverse effect, super hyper  . Penicillins Hives  . Valium    Objective:  There were no vitals filed for this visit. There is no height or weight on file to calculate BMI. Constitutional Well developed. Well nourished.  Vascular Dorsalis pedis pulses palpable bilaterally. Posterior tibial pulses palpable  bilaterally. Capillary refill normal to all digits.  No cyanosis or clubbing noted. Pedal hair growth normal.  Neurologic Normal speech. Oriented to person, place, and time. Epicritic sensation to light touch grossly present bilaterally.  Dermatologic Painful ingrowing nail at medial nail borders of the hallux nail right. No other open wounds. No skin lesions.  Orthopedic: Normal joint ROM without pain or crepitus bilaterally. No visible deformities. No bony tenderness.   Radiographs: None Assessment:   1. Paronychia of toe of right foot due to ingrown toenail   2. Great toe pain, right    Plan:  Patient was evaluated and treated and all questions answered.  Ingrown Nail, right -Patient elects to proceed with minor surgery to remove ingrown toenail removal today. Consent reviewed and signed by patient. -Ingrown nail excised. See procedure note. -Educated on post-procedure care including soaking. Written instructions provided and reviewed. -Patient to follow up in 2 weeks for nail check. -Doxy was prescribed for skin and soft tissue prophylaxis  Procedure: Excision of Ingrown Toenail Location: Right 1st toe medial nail borders. Anesthesia: Lidocaine 1% plain; 1.5 mL and Marcaine 0.5% plain; 1.5 mL, digital block. Skin Prep: Betadine. Dressing: Silvadene; telfa; dry, sterile, compression dressing. Technique: Following skin prep, the toe was exsanguinated and a tourniquet was secured at the base of the toe. The affected nail border was freed, split with a nail splitter, and excised. Chemical matrixectomy was then performed with phenol and irrigated out with alcohol. The tourniquet was then removed and sterile dressing applied. Disposition: Patient tolerated procedure well. Patient to return in 2 weeks for follow-up.   No follow-ups on file.

## 2020-05-27 ENCOUNTER — Ambulatory Visit: Payer: Medicare Other | Admitting: *Deleted

## 2020-05-30 ENCOUNTER — Other Ambulatory Visit: Payer: Self-pay | Admitting: Family Medicine

## 2020-05-30 MED FILL — ALPRAZolam ER 3 MG TB24: 3 | 30 days supply | Qty: 30 | Fill #0

## 2020-05-30 NOTE — Telephone Encounter (Signed)
Last office visit- 02-29-2020 Last refill-02-12-2020 Next office visit-not scheduled UDS- not on file

## 2020-06-17 ENCOUNTER — Encounter: Payer: Self-pay | Admitting: Family Medicine

## 2020-06-17 ENCOUNTER — Other Ambulatory Visit: Payer: Self-pay | Admitting: Family Medicine

## 2020-06-20 MED ORDER — AMPHETAMINE-DEXTROAMPHETAMINE 20 MG PO TABS: 20.0000 mg | ORAL_TABLET | Freq: Three times a day (TID) | ORAL | 0 refills | Status: DC

## 2020-06-20 NOTE — Telephone Encounter (Signed)
I have refilled but he will need an appt soon

## 2020-06-21 NOTE — Telephone Encounter (Signed)
Called left message to call back 

## 2020-06-23 ENCOUNTER — Other Ambulatory Visit: Payer: Self-pay | Admitting: Family Medicine

## 2020-06-23 ENCOUNTER — Other Ambulatory Visit: Payer: Self-pay

## 2020-06-23 ENCOUNTER — Ambulatory Visit (INDEPENDENT_AMBULATORY_CARE_PROVIDER_SITE_OTHER): Payer: Medicare Other | Admitting: Family Medicine

## 2020-06-23 VITALS — BP 126/67 | HR 92 | Temp 99.0°F | Resp 14 | Ht 74.0 in | Wt 195.4 lb

## 2020-06-23 DIAGNOSIS — Z79899 Other long term (current) drug therapy: Secondary | ICD-10-CM | POA: Diagnosis not present

## 2020-06-23 DIAGNOSIS — E1169 Type 2 diabetes mellitus with other specified complication: Secondary | ICD-10-CM

## 2020-06-23 DIAGNOSIS — F3160 Bipolar disorder, current episode mixed, unspecified: Secondary | ICD-10-CM | POA: Diagnosis not present

## 2020-06-23 DIAGNOSIS — F908 Attention-deficit hyperactivity disorder, other type: Secondary | ICD-10-CM

## 2020-06-23 DIAGNOSIS — E785 Hyperlipidemia, unspecified: Secondary | ICD-10-CM

## 2020-06-23 DIAGNOSIS — R739 Hyperglycemia, unspecified: Secondary | ICD-10-CM

## 2020-06-23 DIAGNOSIS — S0990XA Unspecified injury of head, initial encounter: Secondary | ICD-10-CM | POA: Diagnosis not present

## 2020-06-23 DIAGNOSIS — R000 Tachycardia, unspecified: Secondary | ICD-10-CM

## 2020-06-23 DIAGNOSIS — K5900 Constipation, unspecified: Secondary | ICD-10-CM

## 2020-06-23 MED ORDER — SERTRALINE HCL 25 MG PO TABS
25.0000 mg | ORAL_TABLET | Freq: Every day | ORAL | 3 refills | Status: DC
Start: 2020-06-23 — End: 2020-07-18

## 2020-06-23 NOTE — Patient Instructions (Signed)
Living With Traumatic Brain Injury Traumatic brain injury (TBI) is an injury to the brain that may be mild, moderate, or severe. Symptoms of any type of TBI can be long lasting (chronic). Depending on the area of the brain that is affected, a TBI can interfere with vision, memory, concentration, speech, balance, sense of touch, and sleep. TBI can also cause chronic symptoms like headache or dizziness. How to cope with lifestyle changes After a TBI, you may need to make changes to your lifestyle in order to recover as well as possible. How quickly and how fully you recover will depend on the severity of your injury. Your recovery plan may involve:  Working with specialists to develop a rehabilitation plan to help you return to your regular activities. Your health care team may include: ? Physical or occupational therapists. ? Speech and language pathologists. ? Mental health counselors. ? Physicians like your primary care physician or neurologist.  Taking time off work or school, depending on your injury.  Avoiding situations where there is a risk for another head injury, such as football, hockey, soccer, basketball, martial arts, downhill snow sports, and horseback riding. Do not do these activities until your health care provider approves.  Resting. Rest helps the brain to heal. Make sure you: ? Get plenty of sleep at night. Avoid staying up late at night. ? Keep the same bedtime hours on weekends and weekdays. ? Rest during the day. Take daytime naps or rest breaks when you feel tired.  Avoiding extra stress on your eyes. You may need to set time limits when working on the computer, watching TV, and reading.  Finding ways to manage stress. This may include: ? Avoiding activities that cause stress. ? Deep breathing, yoga, or meditation. ? Listening to music or spending time outdoors.  Making lists, setting reminders, or using a day planner to help your memory.  Allowing yourself plenty  of time to complete everyday tasks, such as grocery shopping, paying bills, and doing laundry.  Avoiding driving. Your ability to drive safely may be affected by your injury. ? Rely on family, friends, or a transportation service to help you get around and to appointments. ? Have a professional evaluation to check your driving ability. ? Access support services to help you return to driving. These may include training and adaptive equipment. Follow these instructions at home:  Take over-the-counter and prescription medicines only as told by your health care provider. Do not take aspirin or other anti-inflammatory medicines such as ibuprofen or naproxen unless approved by your health care provider.  Avoid large amounts of caffeine. Your body may be more sensitive to it after your injury.  Do not use any products that contain nicotine or tobacco, such as cigarettes, e-cigarettes, nicotine gum, and patches. If you need help quitting, ask your health care provider.  Do not use drugs.  Limit alcohol intake to no more than 1 drink per day for nonpregnant women and 2 drinks per day for men. One drink equals 12 ounces of beer, 5 ounces of wine, or 1 ounces of hard liquor.  Do not drive until cleared by your health care provider.  Keep all follow-up visits as told by your health care provider. This is important. Where to find support  Talk with your employer, co-workers, teachers, or school counselor about your injury. Work together to develop a plan for completing tasks while you recover.  Talk to others living with a TBI. Join a support group with other people   who have experienced a TBI.  Let your friends and family members know what they can do to help. This might include helping at home or transportation to appointments.  If you are unable to continue working after your injury, talk to a social worker about options to help you meet your financial needs.  Seek out additional resources if  you are a military serviceman or family member, such as: ? [facility]: dvbic.dcoe.mil ? [facility] [employer] Military and Veterans Crisis Line: 1-800-273-8255 Questions to ask your health care provider:  How serious is my injury?  What is my rehabilitation plan?  What is my expected recovery?  When can I return to work or school?  When can I return to regular activities, including driving? Contact a health care provider if:  You have new or worsening: ? Dizziness. ? Headache. ? Anxiety or depression. ? Irritability. ? Confusion. ? Jerky movements that you cannot control (seizures). ? Extreme sensitivity to light or sound. ? Nausea or vomiting. Summary  Traumatic brain injury (TBI) is an injury to your brain that can interfere with vision, memory, concentration, speech, balance, sense of touch, and sleep. TBI can also cause chronic symptoms like headache or dizziness.  After a TBI you may need to make several changes to your lifestyle in order to recover as well as possible. How quickly and how fully you recover will depend on the severity of your injury.  Talk to your family, friends, employer, co-workers, teachers, or school counselor about your injury. Work together to develop a plan for completing tasks while you recover. This information is not intended to replace advice given to you by your health care provider. Make sure you discuss any questions you have with your health care provider. Document Revised: 02/06/2019 Document Reviewed: 10/11/2016 Elsevier Patient Education  2020 Elsevier Inc.  

## 2020-06-24 ENCOUNTER — Telehealth: Payer: Self-pay | Admitting: Podiatry

## 2020-06-24 LAB — CBC
HCT: 46.1 % (ref 38.5–50.0)
Hemoglobin: 15.7 g/dL (ref 13.2–17.1)
MCH: 29.5 pg (ref 27.0–33.0)
MCHC: 34.1 g/dL (ref 32.0–36.0)
MCV: 86.7 fL (ref 80.0–100.0)
MPV: 10.5 fL (ref 7.5–12.5)
Platelets: 279 10*3/uL (ref 140–400)
RBC: 5.32 10*6/uL (ref 4.20–5.80)
RDW: 14 % (ref 11.0–15.0)
WBC: 8.3 10*3/uL (ref 3.8–10.8)

## 2020-06-24 LAB — HEMOGLOBIN A1C
Hgb A1c MFr Bld: 5 % of total Hgb (ref <5.7)
Mean Plasma Glucose: 97 (calc)
eAG (mmol/L): 5.4 (calc)

## 2020-06-24 LAB — COMPREHENSIVE METABOLIC PANEL
AG Ratio: 1.8 (calc) (ref 1.0–2.5)
ALT: 14 U/L (ref 9–46)
AST: 22 U/L (ref 10–40)
Albumin: 4.9 g/dL (ref 3.6–5.1)
Alkaline phosphatase (APISO): 83 U/L (ref 36–130)
BUN: 16 mg/dL (ref 7–25)
CO2: 26 mmol/L (ref 20–32)
Calcium: 10 mg/dL (ref 8.6–10.3)
Chloride: 99 mmol/L (ref 98–110)
Creat: 1.19 mg/dL (ref 0.60–1.35)
Globulin: 2.8 g/dL (calc) (ref 1.9–3.7)
Glucose, Bld: 91 mg/dL (ref 65–99)
Potassium: 4.8 mmol/L (ref 3.5–5.3)
Sodium: 135 mmol/L (ref 135–146)
Total Bilirubin: 1 mg/dL (ref 0.2–1.2)
Total Protein: 7.7 g/dL (ref 6.1–8.1)

## 2020-06-24 LAB — LIPID PANEL
Cholesterol: 205 mg/dL — ABNORMAL HIGH (ref <200)
HDL: 63 mg/dL (ref >=40)
LDL Cholesterol (Calc): 125 mg/dL (calc) — ABNORMAL HIGH
Non-HDL Cholesterol (Calc): 142 mg/dL (calc) — ABNORMAL HIGH (ref <130)
Total CHOL/HDL Ratio: 3.3 (calc) (ref <5.0)
Triglycerides: 74 mg/dL (ref <150)

## 2020-06-24 LAB — TSH: TSH: 1.71 mIU/L (ref 0.40–4.50)

## 2020-06-24 MED ORDER — DOXYCYCLINE HYCLATE 100 MG PO TABS: 100.0000 mg | ORAL_TABLET | Freq: Two times a day (BID) | ORAL | 0 refills | Status: DC

## 2020-06-24 NOTE — Telephone Encounter (Signed)
Patient called the nurse line x 2 stating that the ingrown toenail is back and there is inflammation around the toenail. He is requesting an antibiotic. I called the patient back. He has had some weeping from the toenail. Sent doxycyline to the pharmacy and recommened epsom salt soaks daily and cover with antibiotic ointment and a bandage.   Marchelle Folks- can you schedule him a follow up with Dr. Allena Katz? Thanks.

## 2020-06-25 LAB — DRUG MONITORING, PANEL 8 WITH CONFIRMATION, URINE
6 Acetylmorphine: NEGATIVE ng/mL (ref <10)
Alcohol Metabolites: NEGATIVE ng/mL
Alphahydroxyalprazolam: 683 ng/mL — ABNORMAL HIGH (ref <25)
Alphahydroxymidazolam: NEGATIVE ng/mL (ref <50)
Alphahydroxytriazolam: NEGATIVE ng/mL (ref <50)
Aminoclonazepam: NEGATIVE ng/mL (ref <25)
Amphetamine: >15000 ng/mL — ABNORMAL HIGH (ref <250)
Amphetamines: POSITIVE ng/mL — AB (ref <500)
Benzodiazepines: POSITIVE ng/mL — AB (ref <100)
Buprenorphine, Urine: NEGATIVE ng/mL (ref <5)
Cocaine Metabolite: NEGATIVE ng/mL (ref <150)
Creatinine: 161.3 mg/dL
Hydroxyethylflurazepam: NEGATIVE ng/mL (ref <50)
Lorazepam: NEGATIVE ng/mL (ref <50)
MDMA: NEGATIVE ng/mL (ref <500)
Marijuana Metabolite: NEGATIVE ng/mL (ref <20)
Methamphetamine: NEGATIVE ng/mL (ref <250)
Nordiazepam: NEGATIVE ng/mL (ref <50)
Opiates: NEGATIVE ng/mL (ref <100)
Oxazepam: NEGATIVE ng/mL (ref <50)
Oxidant: NEGATIVE ug/mL
Oxycodone: NEGATIVE ng/mL (ref <100)
Temazepam: NEGATIVE ng/mL (ref <50)
pH: 5.3 (ref 4.5–9.0)

## 2020-06-25 LAB — DM TEMPLATE

## 2020-06-27 MED FILL — ALPRAZolam ER 3 MG TB24: 3 | 30 days supply | Qty: 30 | Fill #1

## 2020-06-28 DIAGNOSIS — R739 Hyperglycemia, unspecified: Secondary | ICD-10-CM | POA: Insufficient documentation

## 2020-06-28 NOTE — Assessment & Plan Note (Signed)
Olanzapine has helped him tremendously but he had notable side effects on higher doses. Will trying changing his low dose SSRI to Sertraline to 25 mg daily. Reassess in 4-6 weeks or as needed

## 2020-06-28 NOTE — Assessment & Plan Note (Signed)
Encouraged heart healthy diet, increase exercise, avoid trans fats, consider a krill oil cap daily 

## 2020-06-28 NOTE — Assessment & Plan Note (Signed)
hgba1c acceptable, minimize simple carbs. Increase exercise as tolerated.  

## 2020-06-28 NOTE — Assessment & Plan Note (Signed)
Improved on recheck.  
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
## 2020-07-06 ENCOUNTER — Other Ambulatory Visit: Payer: Self-pay

## 2020-07-06 ENCOUNTER — Ambulatory Visit (INDEPENDENT_AMBULATORY_CARE_PROVIDER_SITE_OTHER): Payer: Medicare Other | Admitting: Podiatry

## 2020-07-06 DIAGNOSIS — L600 Ingrowing nail: Secondary | ICD-10-CM | POA: Diagnosis not present

## 2020-07-06 DIAGNOSIS — M79674 Pain in right toe(s): Secondary | ICD-10-CM | POA: Diagnosis not present

## 2020-07-06 DIAGNOSIS — L03031 Cellulitis of right toe: Secondary | ICD-10-CM | POA: Diagnosis not present

## 2020-07-06 MED ORDER — DOXYCYCLINE HYCLATE 100 MG PO TABS: 100.0000 mg | ORAL_TABLET | Freq: Two times a day (BID) | ORAL | 0 refills | Status: DC

## 2020-07-07 ENCOUNTER — Encounter: Payer: Self-pay | Admitting: Podiatry

## 2020-07-07 NOTE — Progress Notes (Signed)
Subjective:  Patient ID: Jesse Jones, male    DOB: 1981-09-25,  MRN: [id number]  Chief Complaint  Patient presents with  . Nail Problem    Pt states concern post ingrown removal "My toenail is cracked and it appears red/inflamed" Denies drainage of blood/pus. Still soaking and applying neosporin.    39 y.o. male presents with the above complaint.  Patient presents with redness/inflammation of right medial border of the hallux after undergoing an ingrown removal.  Patient states that he is still doing soaking and applying Neosporin.  He denies any pus.  He just concerned about the ingrown may be coming back.  He would like to get it evaluated.  He denies any other acute complaints.   Review of Systems: Negative except as noted in the HPI. Denies N/V/F/Ch.  Past Medical History:  Diagnosis Date  . ADD (attention deficit disorder)   . ADHD (attention deficit hyperactivity disorder) 04/02/2011  . 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 Anxiety   . 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 Concussion    X 6- 7  . 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 Depression   . ED (erectile dysfunction) 05/12/2012  . Hyperlipidemia, mild 04/18/2015  . Insomnia   . 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 Outbursts of anger   . Preventative health care 10/03/2011  . Tobacco abuse 04/28/2011    Current Outpatient Medications:  .  ALPRAZolam (XANAX XR) 3 MG 24 hr tablet, TAKE 1 TABLET (3 MG TOTAL) BY MOUTH EVERY MORNING., Disp: 30 tablet, Rfl: 3 .  amphetamine-dextroamphetamine (ADDERALL) 20 MG tablet, Take 1 tablet (20 mg total) by mouth 3 (three) times daily. April 2021, Disp: 90 tablet, Rfl: 0 .  amphetamine-dextroamphetamine (ADDERALL) 20 MG tablet, Take 1 tablet (20 mg total) by mouth 3 (three) times daily. March 2021, Disp: 90 tablet, Rfl: 0 .  amphetamine-dextroamphetamine (ADDERALL) 20 MG tablet, Take 1 tablet (20 mg total) by mouth 3 (three) times daily. May 2021, Disp: 90 tablet, Rfl: 0 .   cholecalciferol (VITAMIN D) 1000 UNITS tablet, Take 1,000 Units by mouth every morning., Disp: , Rfl:  .  citalopram (CELEXA) 20 MG tablet, , Disp: , Rfl:  .  clindamycin-benzoyl peroxide (BENZACLIN) gel, Apply topically 2 (two) times daily., Disp: 25 g, Rfl: 2 .  doxycycline (VIBRA-TABS) 100 MG tablet, Take 1 tablet (100 mg total) by mouth 2 (two) times daily., Disp: 20 tablet, Rfl: 0 .  fish oil-omega-3 fatty acids 1000 MG capsule, Take 2 g by mouth daily., Disp: , Rfl:  .  fluticasone (FLONASE) 50 MCG/ACT nasal spray, Place 2 sprays into both nostrils daily as needed for allergies or rhinitis., Disp: 16 g, Rfl: 6 .  furosemide (LASIX) 20 MG tablet, TAKE 1 TABLET BY MOUTH EVERY DAY AS NEEDED, Disp: 90 tablet, Rfl: 1 .  methocarbamol (ROBAXIN) 500 MG tablet, Take 1 tablet (500 mg total) by mouth 2 (two) times daily., Disp: 20 tablet, Rfl: 0 .  Multiple Vitamin (MULTIVITAMIN) tablet, Take 1 tablet by mouth every morning. , Disp: , Rfl:  .  OLANZapine (ZYPREXA) 2.5 MG tablet, Take 1 tablet (2.5 mg total) by mouth at bedtime., Disp: 30 tablet, Rfl: 3 .  sertraline (ZOLOFT) 25 MG tablet, Take 1 tablet (25 mg total) by mouth daily., Disp: 30 tablet, Rfl: 3 .  vitamin B-12 (CYANOCOBALAMIN) 1000 MCG tablet, Take 1,000 mcg by mouth every morning., Disp: , Rfl:  .  vitamin C (ASCORBIC ACID) 500 MG tablet, Take 500 mg  by mouth daily., Disp: , Rfl:   Social History   Tobacco Use  Smoking Status Current Every Day Smoker  . Packs/day: 0.30  . Years: 20.00  . Pack years: 6.00  . Types: Cigarettes  Smokeless Tobacco Never Used    Allergies  Allergen Reactions  . Haloperidol Decanoate   . Hydroxyzine Anxiety    EXCESSIVE ANXIOUNESS  . Other Other (See Comments)    Reverse effect, super hyper  . Penicillins Hives  . Valium    Objective:  There were no vitals filed for this visit. There is no height or weight on file to calculate BMI. Constitutional Well developed. Well nourished.  Vascular  Dorsalis pedis pulses palpable bilaterally. Posterior tibial pulses palpable bilaterally. Capillary refill normal to all digits.  No cyanosis or clubbing noted. Pedal hair growth normal.  Neurologic Normal speech. Oriented to person, place, and time. Epicritic sensation to light touch grossly present bilaterally.  Dermatologic  mild erythema noted circumferentially around the big toe on the right side.  No recurrence of the ingrown noted.  The ingrown site is healing adequately.  No purulent drainage was expressed.  No malodor present.  Orthopedic: Normal joint ROM without pain or crepitus bilaterally. No visible deformities. No bony tenderness.   Radiographs: None Assessment:   1. Paronychia of toe of right foot due to ingrown toenail   2. Great toe pain, right    Plan:  Patient was evaluated and treated and all questions answered.  Right hallux paronychia secondary to ingrown -I explained to patient the etiology of paronychia and various treatment options were discussed.  I have asked him to stop doing soaks and keeping covered with Neosporin and a Band-Aid.  I believe will benefit from doxycycline course for 10 days to help with the paronychia.  Patient states understanding will take it as directed. -I will see him back again in 3 weeks if there is no improvement.  Patient states understanding   No follow-ups on file.

## 2020-07-08 DIAGNOSIS — M25512 Pain in left shoulder: Secondary | ICD-10-CM | POA: Diagnosis not present

## 2020-07-16 ENCOUNTER — Other Ambulatory Visit: Payer: Self-pay | Admitting: Family Medicine

## 2020-07-21 MED FILL — ALPRAZolam ER 3 MG TB24: 3 | 30 days supply | Qty: 30 | Fill #2

## 2020-07-27 ENCOUNTER — Encounter: Payer: Self-pay | Admitting: Family Medicine

## 2020-08-15 ENCOUNTER — Other Ambulatory Visit: Payer: Self-pay | Admitting: Family Medicine

## 2020-08-15 MED ORDER — AMPHETAMINE-DEXTROAMPHETAMINE 20 MG PO TABS: 20.0000 mg | ORAL_TABLET | Freq: Three times a day (TID) | ORAL | 0 refills | Status: DC

## 2020-08-15 NOTE — Telephone Encounter (Signed)
Requesting: Adderall 20mg  Contract: 03/27/2018 UDS: 06/23/2020 Low risk Last Visit: 06/23/2020 Next Visit: 08/26/2020 Last Refill: 06/20/2020 #90 and 0rf Pt sig: 1 tab tid prn  Please Advise

## 2020-08-15 NOTE — Telephone Encounter (Signed)
Medication: OLANZapine (ZYPREXA) 2.5 MG tablet [[id number]]    Has the patient contacted their pharmacy? No. (If no, request that the patient contact the pharmacy for the refill.) (If yes, when and what did the pharmacy advise?)  Preferred Pharmacy (with phone number or street name): [id number] - [place], [place] - [address] AT [facility] [address]  [address], [place] [address]  Phone:  [phone] Fax:  [phone]  DEA #:  [id number]  Agent: Please be advised that RX refills may take up to 3 business days. We ask that you follow-up with your pharmacy.

## 2020-08-15 NOTE — Telephone Encounter (Signed)
Last written: Last ov: Next ov: Contract: UDS:;  

## 2020-08-17 MED FILL — ALPRAZolam ER 3 MG TB24: 3 | 30 days supply | Qty: 30 | Fill #3

## 2020-08-26 ENCOUNTER — Other Ambulatory Visit: Payer: Self-pay

## 2020-08-26 ENCOUNTER — Telehealth (INDEPENDENT_AMBULATORY_CARE_PROVIDER_SITE_OTHER): Payer: Medicare Other | Admitting: Family Medicine

## 2020-08-26 DIAGNOSIS — R739 Hyperglycemia, unspecified: Secondary | ICD-10-CM | POA: Diagnosis not present

## 2020-08-26 DIAGNOSIS — F3160 Bipolar disorder, current episode mixed, unspecified: Secondary | ICD-10-CM

## 2020-08-26 DIAGNOSIS — E785 Hyperlipidemia, unspecified: Secondary | ICD-10-CM

## 2020-08-26 DIAGNOSIS — F908 Attention-deficit hyperactivity disorder, other type: Secondary | ICD-10-CM

## 2020-08-26 DIAGNOSIS — F419 Anxiety disorder, unspecified: Secondary | ICD-10-CM | POA: Diagnosis not present

## 2020-08-26 MED ORDER — AMPHETAMINE-DEXTROAMPHETAMINE 20 MG PO TABS: 20.0000 mg | ORAL_TABLET | Freq: Three times a day (TID) | ORAL | 0 refills | Status: DC

## 2020-08-26 MED ORDER — AMPHETAMINE-DEXTROAMPHETAMINE 20 MG PO TABS: 20.0000 mg | ORAL_TABLET | Freq: Two times a day (BID) | ORAL | 0 refills | Status: DC

## 2020-08-26 NOTE — Assessment & Plan Note (Addendum)
He had a depressive episode after his MGM died last month, at age 39. He was very close to her, he describes her as the only one who could calm him down after his initial grief he now feels he is doing better and his medications are working well. Continue Zyprexa 2.5 mg qhs and Citalopram 20 mg daily

## 2020-08-26 NOTE — Patient Instructions (Signed)
f/u in 3 months

## 2020-08-28 NOTE — Assessment & Plan Note (Signed)
His anxiety flared after his grandmother's death but he feels he is now doing better. No change at this time.

## 2020-08-28 NOTE — Assessment & Plan Note (Signed)
Doing well on current meds, no changes at this time 

## 2020-08-28 NOTE — Assessment & Plan Note (Signed)
Encouraged heart healthy diet, increase exercise, avoid trans fats, consider a krill oil cap daily 

## 2020-08-28 NOTE — Progress Notes (Signed)
Virtual Visit via Video Note  I connected with Jesse Jones on 08/26/20 at  9:20 AM EDT by a video enabled telemedicine application and verified that I am speaking with the correct person using two identifiers.  Location: Patient: home, patient and provider are in visit Provider: home   I discussed the limitations of evaluation and management by telemedicine and the availability of in person appointments. The patient expressed understanding and agreed to proceed. Angelina Sheriff, CMA was able to get the patient set up on a video visit  Subjective:    Patient ID: Jesse Jones, male    DOB: 12-19-1980, 39 y.o.   MRN: [id number]  Chief Complaint  Patient presents with  . Follow-up    2 month- mood    HPI Patient is in today for follow up on chronic medical concerns. No recent febrile illness or hospitalizations. He lost his maternal grandmother recently at age 36. He notes he really struggled with depression and anxiety right after she died but he is feeling much better. He feels his medications have been helpful. No suicidal ideation. Denies CP/palp/SOB/HA/congestion/fevers/GI or GU c/o. Taking meds as prescribed  Past Medical History:  Diagnosis Date  . ADD (attention deficit disorder)   . ADHD (attention deficit hyperactivity disorder) 04/02/2011  . 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 Anxiety   . 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 Concussion    X 6- 7  . 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 Depression   . ED (erectile dysfunction) 05/12/2012  . Hyperlipidemia, mild 04/18/2015  . Insomnia   . 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 Outbursts of anger   . Preventative health care 10/03/2011  . Tobacco abuse 04/28/2011    Past Surgical History:  Procedure Laterality Date  . NASAL SEPTUM SURGERY Bilateral    Dr Jenne Pane 2017  . punctured lung    . toe surgeries  during childhood    for ingrown toenails    Family History  Problem Relation Age of Onset  . Depression  Mother   . Anxiety disorder Mother   . Depression Brother   . Diabetes Brother        type 1  . Cancer Paternal Grandmother        lung/ didn't smoke  . Cancer Maternal Uncle 85       colon cancer    Social History   Socioeconomic History  . Marital status: Single    Spouse name: Not on file  . Number of children: Not on file  . Years of education: Not on file  . Highest education level: Not on file  Occupational History  . Not on file  Tobacco Use  . Smoking status: Current Every Day Smoker    Packs/day: 0.30    Years: 20.00    Pack years: 6.00    Types: Cigarettes  . Smokeless tobacco: Never Used  Vaping Use  . Vaping Use: Never used  Substance and Sexual Activity  . Alcohol use: Yes    Alcohol/week: 36.0 standard drinks    Types: 36 Cans of beer per week    Comment: sober x78yr. No alcohol.  . Drug use: Yes    Types: Marijuana, Other-see comments    Comment: pt stopped 4 year ago  . Sexual activity: Yes    Partners: Female    Comment: lives alone and eating well. exercising  Other Topics Concern  . Not on file  Social History Narrative  . Not on  file   Social Determinants of Health   Financial Resource Strain:   . Difficulty of Paying Living Expenses: Not on file  Food Insecurity:   . Worried About Programme researcher, broadcasting/film/video in the Last Year: Not on file  . Ran Out of Food in the Last Year: Not on file  Transportation Needs:   . Lack of Transportation (Medical): Not on file  . Lack of Transportation (Non-Medical): Not on file  Physical Activity:   . Days of Exercise per Week: Not on file  . Minutes of Exercise per Session: Not on file  Stress:   . Feeling of Stress : Not on file  Social Connections:   . Frequency of Communication with Friends and Family: Not on file  . Frequency of Social Gatherings with Friends and Family: Not on file  . Attends Religious Services: Not on file  . Active Member of Clubs or Organizations: Not on file  . Attends Tax inspector Meetings: Not on file  . Marital Status: Not on file  Intimate Partner Violence:   . Fear of Current or Ex-Partner: Not on file  . Emotionally Abused: Not on file  . Physically Abused: Not on file  . Sexually Abused: Not on file    Outpatient Medications Prior to Visit  Medication Sig Dispense Refill  . ALPRAZolam (XANAX XR) 3 MG 24 hr tablet TAKE 1 TABLET (3 MG TOTAL) BY MOUTH EVERY MORNING. 30 tablet 3  . cholecalciferol (VITAMIN D) 1000 UNITS tablet Take 1,000 Units by mouth every morning.    . citalopram (CELEXA) 20 MG tablet     . clindamycin-benzoyl peroxide (BENZACLIN) gel Apply topically 2 (two) times daily. 25 g 2  . fish oil-omega-3 fatty acids 1000 MG capsule Take 2 g by mouth daily.    . fluticasone (FLONASE) 50 MCG/ACT nasal spray Place 2 sprays into both nostrils daily as needed for allergies or rhinitis. 16 g 6  . furosemide (LASIX) 20 MG tablet TAKE 1 TABLET BY MOUTH EVERY DAY AS NEEDED 90 tablet 1  . methocarbamol (ROBAXIN) 500 MG tablet Take 1 tablet (500 mg total) by mouth 2 (two) times daily. 20 tablet 0  . Multiple Vitamin (MULTIVITAMIN) tablet Take 1 tablet by mouth every morning.     Marland Kitchen OLANZapine (ZYPREXA) 2.5 MG tablet Take 1 tablet (2.5 mg total) by mouth at bedtime. 30 tablet 3  . vitamin B-12 (CYANOCOBALAMIN) 1000 MCG tablet Take 1,000 mcg by mouth every morning.    . vitamin C (ASCORBIC ACID) 500 MG tablet Take 500 mg by mouth daily.    Marland Kitchen amphetamine-dextroamphetamine (ADDERALL) 20 MG tablet Take 1 tablet (20 mg total) by mouth 3 (three) times daily. May 2021 90 tablet 0  . doxycycline (VIBRA-TABS) 100 MG tablet Take 1 tablet (100 mg total) by mouth 2 (two) times daily. 20 tablet 0  . sertraline (ZOLOFT) 25 MG tablet TAKE 1 TABLET BY MOUTH EVERY DAY (Patient not taking: Reported on 08/26/2020) 90 tablet 2   No facility-administered medications prior to visit.    Allergies  Allergen Reactions  . Haloperidol Decanoate   . Hydroxyzine Anxiety     EXCESSIVE ANXIOUNESS  . Other Other (See Comments)    Reverse effect, super hyper  . Penicillins Hives  . Valium     Review of Systems  Constitutional: Negative for fever and malaise/fatigue.  HENT: Negative for congestion.   Eyes: Negative for blurred vision.  Respiratory: Negative for shortness of breath.   Cardiovascular:  Negative for chest pain, palpitations and leg swelling.  Gastrointestinal: Negative for abdominal pain, blood in stool and nausea.  Genitourinary: Negative for dysuria and frequency.  Musculoskeletal: Negative for falls.  Skin: Negative for rash.  Neurological: Negative for dizziness, loss of consciousness and headaches.  Endo/Heme/Allergies: Negative for environmental allergies.  Psychiatric/Behavioral: Positive for depression. Negative for hallucinations, substance abuse and suicidal ideas. The patient is nervous/anxious.        Objective:    Physical Exam Constitutional:      Appearance: Normal appearance. He is not ill-appearing.  HENT:     Head: Normocephalic and atraumatic.     Right Ear: External ear normal.     Left Ear: External ear normal.     Nose: Nose normal.  Eyes:     General:        Right eye: No discharge.        Left eye: No discharge.  Pulmonary:     Effort: Pulmonary effort is normal.  Neurological:     Mental Status: He is alert and oriented to person, place, and time.  Psychiatric:        Behavior: Behavior normal.     There were no vitals taken for this visit. Wt Readings from Last 3 Encounters:  06/23/20 195 lb 6.4 oz (88.6 kg)  02/27/20 201 lb (91.2 kg)  02/12/20 201 lb (91.2 kg)    Diabetic Foot Exam - Simple   No data filed     Lab Results  Component Value Date   WBC 8.3 06/23/2020   HGB 15.7 06/23/2020   HCT 46.1 06/23/2020   PLT 279 06/23/2020   GLUCOSE 91 06/23/2020   CHOL 205 (H) 06/23/2020   TRIG 74 06/23/2020   HDL 63 06/23/2020   LDLCALC 125 (H) 06/23/2020   ALT 14 06/23/2020   AST 22  06/23/2020   NA 135 06/23/2020   K 4.8 06/23/2020   CL 99 06/23/2020   CREATININE 1.19 06/23/2020   BUN 16 06/23/2020   CO2 26 06/23/2020   TSH 1.71 06/23/2020   HGBA1C 5.0 06/23/2020    Lab Results  Component Value Date   TSH 1.71 06/23/2020   Lab Results  Component Value Date   WBC 8.3 06/23/2020   HGB 15.7 06/23/2020   HCT 46.1 06/23/2020   MCV 86.7 06/23/2020   PLT 279 06/23/2020   Lab Results  Component Value Date   NA 135 06/23/2020   K 4.8 06/23/2020   CO2 26 06/23/2020   GLUCOSE 91 06/23/2020   BUN 16 06/23/2020   CREATININE 1.19 06/23/2020   BILITOT 1.0 06/23/2020   ALKPHOS 73 09/30/2018   AST 22 06/23/2020   ALT 14 06/23/2020   PROT 7.7 06/23/2020   ALBUMIN 5.1 09/30/2018   CALCIUM 10.0 06/23/2020   GFR 88.78 09/30/2018   Lab Results  Component Value Date   CHOL 205 (H) 06/23/2020   Lab Results  Component Value Date   HDL 63 06/23/2020   Lab Results  Component Value Date   LDLCALC 125 (H) 06/23/2020   Lab Results  Component Value Date   TRIG 74 06/23/2020   Lab Results  Component Value Date   CHOLHDL 3.3 06/23/2020   Lab Results  Component Value Date   HGBA1C 5.0 06/23/2020       Assessment & Plan:   Problem List Items Addressed This Visit    ADHD (attention deficit hyperactivity disorder)    Doing well on current meds, no changes at  this time.       Anxiety    His anxiety flared after his grandmother's death but he feels he is now doing better. No change at this time.       Bipolar disorder, mixed (HCC)    He had a depressive episode after his MGM died last month, at age [age]. He was very close to her, he describes her as the only one who could calm him down after his initial grief he now feels he is doing better and his medications are working well. Continue Zyprexa 2.5 mg qhs and Citalopram 20 mg daily      Hyperlipidemia, mild    Encouraged heart healthy diet, increase exercise, avoid trans fats, consider a krill oil cap  daily      Hyperglycemia    hgba1c acceptable, minimize simple carbs. Increase exercise as tolerated.          I have discontinued Jesse KielMatthew A. Chaffin "Jesse Jones"'s doxycycline and sertraline. I have also changed his amphetamine-dextroamphetamine. Additionally, I am having him start on amphetamine-dextroamphetamine and amphetamine-dextroamphetamine. Lastly, I am having him maintain his multivitamin, vitamin B-12, cholecalciferol, fish oil-omega-3 fatty acids, vitamin C, fluticasone, clindamycin-benzoyl peroxide, furosemide, OLANZapine, methocarbamol, ALPRAZolam, and citalopram.  Meds ordered this encounter  Medications  . amphetamine-dextroamphetamine (ADDERALL) 20 MG tablet    Sig: Take 1 tablet (20 mg total) by mouth 3 (three) times daily. January 2022    Dispense:  90 tablet    Refill:  0  . amphetamine-dextroamphetamine (ADDERALL) 20 MG tablet    Sig: Take 1 tablet (20 mg total) by mouth 2 (two) times daily. December 2021    Dispense:  60 tablet    Refill:  0  . amphetamine-dextroamphetamine (ADDERALL) 20 MG tablet    Sig: Take 1 tablet (20 mg total) by mouth 2 (two) times daily. November 2021    Dispense:  60 tablet    Refill:  0   I discussed the assessment and treatment plan with the patient. The patient was provided an opportunity to ask questions and all were answered. The patient agreed with the plan and demonstrated an understanding of the instructions.   The patient was advised to call back or seek an in-person evaluation if the symptoms worsen or if the condition fails to improve as anticipated.  I provided 25 minutes of face-to-face time during this encounter.   Danise EdgeStacey Meryn Sarracino, MD

## 2020-08-28 NOTE — Assessment & Plan Note (Signed)
hgba1c acceptable, minimize simple carbs. Increase exercise as tolerated.  

## 2020-08-30 ENCOUNTER — Telehealth: Payer: Self-pay

## 2020-08-30 NOTE — Telephone Encounter (Signed)
Spoke with Revonda Standard at [facility] and she confirmed patient has refills for November and to disregard rx refill request.

## 2020-09-12 ENCOUNTER — Other Ambulatory Visit (HOSPITAL_BASED_OUTPATIENT_CLINIC_OR_DEPARTMENT_OTHER): Payer: Self-pay | Admitting: Family Medicine

## 2020-09-12 ENCOUNTER — Other Ambulatory Visit: Payer: Self-pay | Admitting: Family Medicine

## 2020-09-12 DIAGNOSIS — Z23 Encounter for immunization: Secondary | ICD-10-CM | POA: Diagnosis not present

## 2020-09-12 NOTE — Telephone Encounter (Signed)
Medication: ALPRAZolam (XANAX XR) 3 MG 24 hr tablet    Has the patient contacted their pharmacy? No. (If no, request that the patient contact the pharmacy for the refill.) (If yes, when and what did the pharmacy advise?)  Preferred Pharmacy (with phone number or street name): [facility] - [place], [place] - [address]  [address] [place] [address]  Phone:  [phone] Fax:  [phone]   Agent: Please be advised that RX refills may take up to 3 business days. We ask that you follow-up with your pharmacy.

## 2020-09-12 NOTE — Telephone Encounter (Signed)
Requesting: alprazolam XR 3mg  Contract: 03/27/2018 UDS: 06/23/2020 Last Visit: 08/26/2020 Next Visit: 12/01/2020 Last Refill: 05/30/2020 #30 and 3RF  Please Advise

## 2020-09-13 ENCOUNTER — Telehealth: Payer: Self-pay | Admitting: Family Medicine

## 2020-09-13 MED FILL — ALPRAZolam ER 3 MG TB24: 3 | 30 days supply | Qty: 30 | Fill #0

## 2020-09-13 NOTE — Telephone Encounter (Signed)
Pt informed refill was too early already recicved November meds

## 2020-09-13 NOTE — Telephone Encounter (Signed)
Medication:  amphetamine-dextroamphetamine (ADDERALL) 20 MG tablet [[id number]]   Has the patient contacted their pharmacy? No. (If no, request that the patient contact the pharmacy for the refill.) (If yes, when and what did the pharmacy advise?)  Preferred Pharmacy (with phone number or street name):  [id number] - [place], [place] - [address] AT [facility] [address]  [address] [place] [address]  Phone:  [phone] Fax:  [phone]  DEA #:  [id number] Agent: Please be advised that RX refills may take up to 3 business days. We ask that you follow-up with your pharmacy.

## 2020-09-13 NOTE — Telephone Encounter (Signed)
Already sent in by provider 09/12/2020

## 2020-10-04 ENCOUNTER — Telehealth: Payer: Self-pay | Admitting: Family Medicine

## 2020-10-04 NOTE — Telephone Encounter (Signed)
Patient states prescription frequency was wrong for the month of December. Patient states he supposed to take 3 times a day.  amphetamine-dextroamphetamine (ADDERALL) 20 MG tablet [[id number]  [id number] - [place], [place] - [address] AT [facility] [address]  [address] [place] [address]  Phone:  [phone] Fax:  [phone]

## 2020-10-05 ENCOUNTER — Other Ambulatory Visit: Payer: Self-pay | Admitting: Family Medicine

## 2020-10-05 ENCOUNTER — Encounter: Payer: Self-pay | Admitting: Family Medicine

## 2020-10-05 MED ORDER — AMPHETAMINE-DEXTROAMPHETAMINE 20 MG PO TABS
20.0000 mg | ORAL_TABLET | Freq: Three times a day (TID) | ORAL | 0 refills | Status: DC
Start: 2020-10-05 — End: 2020-12-29

## 2020-10-05 MED ORDER — AMPHETAMINE-DEXTROAMPHETAMINE 20 MG PO TABS
20.0000 mg | ORAL_TABLET | Freq: Three times a day (TID) | ORAL | 0 refills | Status: DC
Start: 2020-10-05 — End: 2020-12-05

## 2020-10-05 NOTE — Telephone Encounter (Signed)
For the month of December he only go 60 pills he would like the additional 30 pills and January rx to be corrected

## 2020-10-05 NOTE — Telephone Encounter (Signed)
After Hours Call: The caller would like a call back from the office regarding his medication. Telephone: [phone]

## 2020-10-05 NOTE — Telephone Encounter (Signed)
I see his November sig was too low. Happy to send in more but when does he want a December prescription for #90? Tid or January?

## 2020-10-05 NOTE — Telephone Encounter (Signed)
Sent in 30 more for December and January's was actually correct so I sent in February in the corrected sig

## 2020-10-07 NOTE — Telephone Encounter (Signed)
mychart message sent to patient

## 2020-10-12 MED FILL — ALPRAZolam ER 3 MG TB24: 3 | 30 days supply | Qty: 30 | Fill #1

## 2020-10-17 ENCOUNTER — Telehealth: Payer: Self-pay | Admitting: Family Medicine

## 2020-10-17 ENCOUNTER — Other Ambulatory Visit: Payer: Self-pay | Admitting: Family Medicine

## 2020-10-17 NOTE — Telephone Encounter (Signed)
The last rx for adderal was written on 10/05/20 was for 30 only, because his rx was written wrong.  Last written: 10/05/20 Last ov: 10/05/20 one for dec and one for february Next ov: 12/01/20 Contract: 03/27/18 UDS: 06/23/20

## 2020-10-17 NOTE — Telephone Encounter (Signed)
Medication:amphetamine-dextroamphetamine (ADDERALL) 20 MG tablet [[id number]]  Has the patient contacted their pharmacy? No. (If no, request that the patient contact the pharmacy for the refill.) (If yes, when and what did the pharmacy advise?)  Preferred Pharmacy (with phone number or street name): [id number] - [place], [place] - [address] AT [facility] [address]  [address], [place] [address]  Phone:  [phone] Fax:  [phone]  DEA #:  [id number]  Agent: Please be advised that RX refills may take up to 3 business days. We ask that you follow-up with your pharmacy.   : Take 1 tablet (20 mg total) by mouth 3 (three) times daily. January 2022

## 2020-10-17 NOTE — Telephone Encounter (Signed)
I sent in an extra 30 to correct the Dec prescription and then #90 for January and February. He should be fine I wrote those a couple of weeks ago

## 2020-10-18 DIAGNOSIS — Z23 Encounter for immunization: Secondary | ICD-10-CM | POA: Diagnosis not present

## 2020-10-18 NOTE — Telephone Encounter (Signed)
Patient notified

## 2020-10-18 NOTE — Telephone Encounter (Signed)
Patient checking status of med

## 2020-11-09 MED FILL — ALPRAZolam ER 3 MG TB24: 3 | 30 days supply | Qty: 30 | Fill #2

## 2020-11-11 ENCOUNTER — Other Ambulatory Visit: Payer: Self-pay | Admitting: Family Medicine

## 2020-11-17 ENCOUNTER — Other Ambulatory Visit: Payer: Self-pay | Admitting: Family Medicine
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
## 2020-12-01 ENCOUNTER — Encounter: Payer: Self-pay | Admitting: Family Medicine

## 2020-12-01 ENCOUNTER — Other Ambulatory Visit: Payer: Self-pay

## 2020-12-01 ENCOUNTER — Telehealth (INDEPENDENT_AMBULATORY_CARE_PROVIDER_SITE_OTHER): Payer: Medicare Other | Admitting: Family Medicine

## 2020-12-01 DIAGNOSIS — E785 Hyperlipidemia, unspecified: Secondary | ICD-10-CM | POA: Diagnosis not present

## 2020-12-01 DIAGNOSIS — F3160 Bipolar disorder, current episode mixed, unspecified: Secondary | ICD-10-CM

## 2020-12-01 DIAGNOSIS — F908 Attention-deficit hyperactivity disorder, other type: Secondary | ICD-10-CM | POA: Diagnosis not present

## 2020-12-01 NOTE — Assessment & Plan Note (Signed)
He often deals with worsening depression in the winter, he is encouraged to try getting a Varilux SAD light. He can consider increasing his Zyprexa to 5 mg daily. For now he feels he is doing well enough for now.

## 2020-12-01 NOTE — Assessment & Plan Note (Signed)
Doing well on current dose of medications. No changes °

## 2020-12-01 NOTE — Progress Notes (Signed)
Virtual Visit via Video Note  I connected with Jesse Jones on 12/01/20 at  2:20 PM EST by a video enabled telemedicine application and verified that I am speaking with the correct person using two identifiers.  Location: Patient: home, patient and provider in visit Provider: office   I discussed the limitations of evaluation and management by telemedicine and the availability of in person appointments. The patient expressed understanding and agreed to proceed. S Chism, CMA was able to get the patient set up on a video visit   Subjective:    Patient ID: Jesse Jones, male    DOB: 1981-01-17, 40 y.o.   MRN: [id number]  Chief Complaint  Patient presents with  . Follow-up    HPI Patient is in today for follow upon chronic medical concerns. He notes he often struggles with worsening anxiety and depression in the winter and that has been true this year. He is actually better this week as opposed to last. He notes anhedonia but no suicidal ideation. No other recent acute concern. No recent febrile illness or hospitalizations. Denies CP/palp/SOB/HA/congestion/fevers/GI or GU c/o. Taking meds as prescribed  Past Medical History:  Diagnosis Date  . ADD (attention deficit disorder)   . ADHD (attention deficit hyperactivity disorder) 04/02/2011  . 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 Anxiety   . 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 Concussion    X 6- 7  . 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 Depression   . ED (erectile dysfunction) 05/12/2012  . Hyperlipidemia, mild 04/18/2015  . Insomnia   . 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 Outbursts of anger   . Preventative health care 10/03/2011  . Tobacco abuse 04/28/2011    Past Surgical History:  Procedure Laterality Date  . NASAL SEPTUM SURGERY Bilateral    Dr Jenne Pane 2017  . punctured lung    . toe surgeries  during childhood    for ingrown toenails    Family History  Problem Relation Age of Onset  . Depression Mother   .  Anxiety disorder Mother   . Depression Brother   . Diabetes Brother        type 1  . Cancer Paternal Grandmother        lung/ didn't smoke  . Cancer Maternal Uncle 85       colon cancer    Social History   Socioeconomic History  . Marital status: Single    Spouse name: Not on file  . Number of children: Not on file  . Years of education: Not on file  . Highest education level: Not on file  Occupational History  . Not on file  Tobacco Use  . Smoking status: Former Smoker    Packs/day: 0.30    Years: 20.00    Pack years: 6.00    Types: Cigarettes  . Smokeless tobacco: Never Used  Vaping Use  . Vaping Use: Never used  Substance and Sexual Activity  . Alcohol use: Yes    Alcohol/week: 36.0 standard drinks    Types: 36 Cans of beer per week    Comment: sober x3yr. No alcohol.  . Drug use: Yes    Types: Marijuana, Other-see comments    Comment: pt stopped 4 year ago  . Sexual activity: Yes    Partners: Female    Comment: lives alone and eating well. exercising  Other Topics Concern  . Not on file  Social History Narrative  . Not on file   Social Determinants  of Health   Financial Resource Strain: Not on file  Food Insecurity: Not on file  Transportation Needs: Not on file  Physical Activity: Not on file  Stress: Not on file  Social Connections: Not on file  Intimate Partner Violence: Not on file    Outpatient Medications Prior to Visit  Medication Sig Dispense Refill  . ALPRAZolam (XANAX XR) 3 MG 24 hr tablet TAKE 1 TABLET (3 MG TOTAL) BY MOUTH EVERY MORNING. 30 tablet 3  . amphetamine-dextroamphetamine (ADDERALL) 20 MG tablet Take 1 tablet (20 mg total) by mouth 3 (three) times daily. January 2022 90 tablet 0  . amphetamine-dextroamphetamine (ADDERALL) 20 MG tablet Take 1 tablet (20 mg total) by mouth in the morning, at noon, and at bedtime. December 2021 30 tablet 0  . amphetamine-dextroamphetamine (ADDERALL) 20 MG tablet Take 1 tablet (20 mg total) by mouth  in the morning, at noon, and at bedtime. February 2022 90 tablet 0  . cholecalciferol (VITAMIN D) 1000 UNITS tablet Take 1,000 Units by mouth every morning.    . citalopram (CELEXA) 20 MG tablet TAKE 1 TABLET BY MOUTH TWICE A DAY 180 tablet 0  . clindamycin-benzoyl peroxide (BENZACLIN) gel Apply topically 2 (two) times daily. 25 g 2  . fish oil-omega-3 fatty acids 1000 MG capsule Take 2 g by mouth daily.    . fluticasone (FLONASE) 50 MCG/ACT nasal spray Place 2 sprays into both nostrils daily as needed for allergies or rhinitis. 16 g 6  . furosemide (LASIX) 20 MG tablet TAKE 1 TABLET BY MOUTH EVERY DAY AS NEEDED 90 tablet 1  . methocarbamol (ROBAXIN) 500 MG tablet Take 1 tablet (500 mg total) by mouth 2 (two) times daily. 20 tablet 0  . Multiple Vitamin (MULTIVITAMIN) tablet Take 1 tablet by mouth every morning.     Marland Kitchen OLANZapine (ZYPREXA) 2.5 MG tablet Take 1 tablet (2.5 mg total) by mouth at bedtime. 90 tablet 1  . vitamin B-12 (CYANOCOBALAMIN) 1000 MCG tablet Take 1,000 mcg by mouth every morning.    . vitamin C (ASCORBIC ACID) 500 MG tablet Take 500 mg by mouth daily.     No facility-administered medications prior to visit.    Allergies  Allergen Reactions  . Haloperidol Decanoate   . Hydroxyzine Anxiety    EXCESSIVE ANXIOUNESS  . Other Other (See Comments)    Reverse effect, super hyper  . Penicillins Hives  . Valium     Review of Systems  Constitutional: Positive for malaise/fatigue. Negative for fever.  HENT: Negative for congestion.   Eyes: Negative for blurred vision.  Respiratory: Negative for shortness of breath.   Cardiovascular: Negative for chest pain, palpitations and leg swelling.  Gastrointestinal: Negative for abdominal pain, blood in stool and nausea.  Genitourinary: Negative for dysuria and frequency.  Musculoskeletal: Negative for falls.  Skin: Negative for rash.  Neurological: Negative for dizziness, loss of consciousness and headaches.  Endo/Heme/Allergies:  Negative for environmental allergies.  Psychiatric/Behavioral: Positive for depression. The patient is nervous/anxious.        Objective:    Physical Exam Constitutional:      Appearance: Normal appearance. He is not ill-appearing.  HENT:     Head: Normocephalic and atraumatic.     Right Ear: External ear normal.     Left Ear: External ear normal.     Nose: Nose normal.  Eyes:     General:        Right eye: No discharge.        Left  eye: No discharge.  Pulmonary:     Effort: Pulmonary effort is normal.  Neurological:     Mental Status: He is alert and oriented to person, place, and time.  Psychiatric:        Behavior: Behavior normal.     Pulse 85   Temp 97.9 F (36.6 C)   Wt 196 lb (88.9 kg)   BMI 25.16 kg/m  Wt Readings from Last 3 Encounters:  12/01/20 196 lb (88.9 kg)  06/23/20 195 lb 6.4 oz (88.6 kg)  02/27/20 201 lb (91.2 kg)    Diabetic Foot Exam - Simple   No data filed    Lab Results  Component Value Date   WBC 8.3 06/23/2020   HGB 15.7 06/23/2020   HCT 46.1 06/23/2020   PLT 279 06/23/2020   GLUCOSE 91 06/23/2020   CHOL 205 (H) 06/23/2020   TRIG 74 06/23/2020   HDL 63 06/23/2020   LDLCALC 125 (H) 06/23/2020   ALT 14 06/23/2020   AST 22 06/23/2020   NA 135 06/23/2020   K 4.8 06/23/2020   CL 99 06/23/2020   CREATININE 1.19 06/23/2020   BUN 16 06/23/2020   CO2 26 06/23/2020   TSH 1.71 06/23/2020   HGBA1C 5.0 06/23/2020    Lab Results  Component Value Date   TSH 1.71 06/23/2020   Lab Results  Component Value Date   WBC 8.3 06/23/2020   HGB 15.7 06/23/2020   HCT 46.1 06/23/2020   MCV 86.7 06/23/2020   PLT 279 06/23/2020   Lab Results  Component Value Date   NA 135 06/23/2020   K 4.8 06/23/2020   CO2 26 06/23/2020   GLUCOSE 91 06/23/2020   BUN 16 06/23/2020   CREATININE 1.19 06/23/2020   BILITOT 1.0 06/23/2020   ALKPHOS 73 09/30/2018   AST 22 06/23/2020   ALT 14 06/23/2020   PROT 7.7 06/23/2020   ALBUMIN 5.1 09/30/2018    CALCIUM 10.0 06/23/2020   GFR 88.78 09/30/2018   Lab Results  Component Value Date   CHOL 205 (H) 06/23/2020   Lab Results  Component Value Date   HDL 63 06/23/2020   Lab Results  Component Value Date   LDLCALC 125 (H) 06/23/2020   Lab Results  Component Value Date   TRIG 74 06/23/2020   Lab Results  Component Value Date   CHOLHDL 3.3 06/23/2020   Lab Results  Component Value Date   HGBA1C 5.0 06/23/2020       Assessment & Plan:   Problem List Items Addressed This Visit    ADHD (attention deficit hyperactivity disorder)    Doing well on current dose of medications. No changes      Bipolar disorder, mixed (HCC)    He often deals with worsening depression in the winter, he is encouraged to try getting a Varilux SAD light. He can consider increasing his Zyprexa to 5 mg daily. For now he feels he is doing well enough for now.       Hyperlipidemia, mild    Encouraged heart healthy diet, increase exercise, avoid trans fats, consider a krill oil cap daily         I am having Jesse Jones "Matt" maintain his multivitamin, vitamin B-12, cholecalciferol, fish oil-omega-3 fatty acids, vitamin C, fluticasone, clindamycin-benzoyl peroxide, furosemide, methocarbamol, amphetamine-dextroamphetamine, ALPRAZolam, amphetamine-dextroamphetamine, amphetamine-dextroamphetamine, OLANZapine, and citalopram.  No orders of the defined types were placed in this encounter.    I discussed the assessment and treatment plan with the patient.  The patient was provided an opportunity to ask questions and all were answered. The patient agreed with the plan and demonstrated an understanding of the instructions.   The patient was advised to call back or seek an in-person evaluation if the symptoms worsen or if the condition fails to improve as anticipated.  I provided 15 minutes of non-face-to-face time during this encounter.   Danise Edge, MD

## 2020-12-01 NOTE — Assessment & Plan Note (Signed)
Encouraged heart healthy diet, increase exercise, avoid trans fats, consider a krill oil cap daily 

## 2020-12-05 ENCOUNTER — Other Ambulatory Visit: Payer: Self-pay | Admitting: Family Medicine

## 2020-12-05 ENCOUNTER — Telehealth: Payer: Self-pay | Admitting: Family Medicine

## 2020-12-05 ENCOUNTER — Encounter: Payer: Self-pay | Admitting: Family Medicine

## 2020-12-05 MED ORDER — AMPHETAMINE-DEXTROAMPHETAMINE 20 MG PO TABS
20.0000 mg | ORAL_TABLET | Freq: Three times a day (TID) | ORAL | 0 refills | Status: DC
Start: 2020-12-05 — End: 2020-12-29

## 2020-12-05 NOTE — Telephone Encounter (Signed)
The January and February prescriptions were for #90 but the December was just for #30. I sent in a March refill for #90. Not sure what else to do

## 2020-12-05 NOTE — Telephone Encounter (Signed)
Patient states Adderrall quantity is wrong. Patient is supposed to take 1 pill 3 times a day. Quantity should be 90 pills  amphetamine-dextroamphetamine (ADDERALL) 20 MG tablet [[id number]]    [id number] - [place], [place] - [address] AT [facility] [address]  [address], [place] [address]  Phone:  [phone] Fax:  [phone]

## 2020-12-05 NOTE — Telephone Encounter (Signed)
Pt needs correct quantity sent for Adderall.

## 2020-12-07 MED FILL — ALPRAZolam ER 3 MG TB24: 3 | 30 days supply | Qty: 30 | Fill #3

## 2020-12-07 NOTE — Telephone Encounter (Signed)
Spoke with pt and they got it corrected already

## 2020-12-12 ENCOUNTER — Encounter: Payer: Self-pay | Admitting: Family Medicine

## 2020-12-13 NOTE — Telephone Encounter (Signed)
Attempted to call pt to clarify medication but unable to reach

## 2020-12-16 ENCOUNTER — Other Ambulatory Visit: Payer: Self-pay

## 2020-12-16 MED ORDER — OLANZAPINE 5 MG PO TABS: 5.0000 mg | ORAL_TABLET | Freq: Every day | ORAL | 3 refills | Status: DC

## 2020-12-16 NOTE — Telephone Encounter (Signed)
Pt was taking 2 tablets at bedtime so I sent in 5 mg of medication instead 2.5 mg

## 2020-12-28 ENCOUNTER — Telehealth: Payer: Self-pay | Admitting: Family Medicine

## 2020-12-28 NOTE — Telephone Encounter (Signed)
Take 1 tablet (20 mg total) by mouth 3 (three) times daily.    Medication: amphetamine-dextroamphetamine (ADDERALL) 20 MG tablet   Has the patient contacted their pharmacy? No. (If no, request that the patient contact the pharmacy for the refill.) (If yes, when and what did the pharmacy advise?)  Preferred Pharmacy (with phone number or street name):  [id number] - [place], [place] - [address] AT [facility] [address] [facility] [address]  [address], [place] [address]  Phone:  [phone] Fax:  [phone]  DEA #:  [id number]  DAW Reason: --     Agent: Please be advised that RX refills may take up to 3 business days. We ask that you follow-up with your pharmacy.

## 2020-12-29 ENCOUNTER — Other Ambulatory Visit: Payer: Self-pay | Admitting: Family Medicine

## 2020-12-29 MED ORDER — AMPHETAMINE-DEXTROAMPHETAMINE 20 MG PO TABS
20.0000 mg | ORAL_TABLET | Freq: Three times a day (TID) | ORAL | 0 refills | Status: DC
Start: 2020-12-29 — End: 2021-01-30

## 2020-12-29 MED ORDER — AMPHETAMINE-DEXTROAMPHETAMINE 20 MG PO TABS: 20.0000 mg | ORAL_TABLET | Freq: Three times a day (TID) | ORAL | 0 refills | Status: DC

## 2020-12-29 NOTE — Telephone Encounter (Signed)
Requesting:adderall 20mg  Contract:06/26/2020 UDS:12/24/20 Last Visit:12/01/20 Next Visit:03/02/21 Last Refill:12/05/20  Please Advise

## 2020-12-29 NOTE — Telephone Encounter (Signed)
Resent March and sent April and May

## 2021-01-02 ENCOUNTER — Other Ambulatory Visit: Payer: Self-pay | Admitting: Family Medicine

## 2021-01-02 ENCOUNTER — Other Ambulatory Visit (HOSPITAL_BASED_OUTPATIENT_CLINIC_OR_DEPARTMENT_OTHER): Payer: Self-pay | Admitting: Family Medicine

## 2021-01-02 NOTE — Telephone Encounter (Signed)
Requesting: alprazolam 3mg  Contract:03/27/2018 UDS:06/23/2020 Last Visit: 12/01/20 Next Visit: 03/02/2021  Last Refill: 09/12/2020 #30 and 3RF  Please Advise

## 2021-01-03 MED FILL — ALPRAZolam ER 3 MG TB24: 3 | 30 days supply | Qty: 30 | Fill #0

## 2021-01-19 ENCOUNTER — Ambulatory Visit: Payer: Medicare Other | Admitting: Family Medicine

## 2021-01-20 ENCOUNTER — Other Ambulatory Visit: Payer: Self-pay | Admitting: Family Medicine

## 2021-01-27 ENCOUNTER — Telehealth: Payer: Self-pay

## 2021-01-27 ENCOUNTER — Other Ambulatory Visit: Payer: Self-pay | Admitting: Family Medicine

## 2021-01-27 MED ORDER — ALPRAZOLAM ER 3 MG PO TB24: 3.0000 mg | ORAL_TABLET | Freq: Every morning | ORAL | 0 refills | Status: DC

## 2021-01-27 NOTE — Telephone Encounter (Signed)
Can you resend to [facility] pharmacy

## 2021-01-27 NOTE — Telephone Encounter (Signed)
Patient would [facility], local pharmacy did not cover it

## 2021-01-27 NOTE — Telephone Encounter (Signed)
Pt aware.

## 2021-01-27 NOTE — Telephone Encounter (Signed)
Pt called states that he missing 3 pills of his Xanax  xr 3 mg and wondering if we could send in 3 pills for him.

## 2021-01-27 NOTE — Telephone Encounter (Signed)
I will but I cannot do this routinely

## 2021-01-28 ENCOUNTER — Other Ambulatory Visit: Payer: Self-pay | Admitting: Family Medicine

## 2021-01-28 ENCOUNTER — Other Ambulatory Visit (HOSPITAL_BASED_OUTPATIENT_CLINIC_OR_DEPARTMENT_OTHER): Payer: Self-pay

## 2021-01-28 MED ORDER — ALPRAZOLAM ER 3 MG PO TB24
3.0000 mg | ORAL_TABLET | Freq: Every morning | ORAL | 0 refills | Status: DC
  Filled 2021-01-28 – 2021-01-30 (×3): qty 3, 3d supply, fill #0

## 2021-01-28 NOTE — Telephone Encounter (Signed)
done

## 2021-01-30 ENCOUNTER — Other Ambulatory Visit (HOSPITAL_BASED_OUTPATIENT_CLINIC_OR_DEPARTMENT_OTHER): Payer: Self-pay

## 2021-01-30 ENCOUNTER — Other Ambulatory Visit: Payer: Self-pay

## 2021-01-30 ENCOUNTER — Ambulatory Visit (INDEPENDENT_AMBULATORY_CARE_PROVIDER_SITE_OTHER): Payer: Medicare Other | Admitting: Family Medicine

## 2021-01-30 VITALS — BP 110/76 | HR 73 | Temp 98.1°F | Resp 12 | Ht 74.0 in | Wt 194.8 lb

## 2021-01-30 DIAGNOSIS — Z23 Encounter for immunization: Secondary | ICD-10-CM

## 2021-01-30 DIAGNOSIS — F419 Anxiety disorder, unspecified: Secondary | ICD-10-CM | POA: Diagnosis not present

## 2021-01-30 DIAGNOSIS — R739 Hyperglycemia, unspecified: Secondary | ICD-10-CM | POA: Diagnosis not present

## 2021-01-30 DIAGNOSIS — F908 Attention-deficit hyperactivity disorder, other type: Secondary | ICD-10-CM

## 2021-01-30 DIAGNOSIS — R000 Tachycardia, unspecified: Secondary | ICD-10-CM | POA: Diagnosis not present

## 2021-01-30 DIAGNOSIS — H66002 Acute suppurative otitis media without spontaneous rupture of ear drum, left ear: Secondary | ICD-10-CM | POA: Diagnosis not present

## 2021-01-30 DIAGNOSIS — E785 Hyperlipidemia, unspecified: Secondary | ICD-10-CM | POA: Diagnosis not present

## 2021-01-30 DIAGNOSIS — H6692 Otitis media, unspecified, left ear: Secondary | ICD-10-CM | POA: Insufficient documentation

## 2021-01-30 MED ORDER — FLUTICASONE PROPIONATE 50 MCG/ACT NA SUSP: 2.0000 | Freq: Every day | NASAL | 6 refills | Status: DC | PRN

## 2021-01-30 MED ORDER — AMPHETAMINE-DEXTROAMPHET ER 20 MG PO CP24: 20.0000 mg | ORAL_CAPSULE | Freq: Three times a day (TID) | ORAL | 0 refills | Status: DC

## 2021-01-30 MED ORDER — AZITHROMYCIN 250 MG PO TABS: ORAL_TABLET | ORAL | 0 refills | Status: DC

## 2021-01-30 NOTE — Telephone Encounter (Signed)
He called this morning.  Medication did not go in.  He has an appt with you at 240pm today.

## 2021-01-30 NOTE — Patient Instructions (Signed)
Varilux lights for seasonal affective disorder Seasonal Affective Disorder Seasonal affective disorder (SAD) is a type of depression. It is when you feel depressed at specific times of the year. SAD is most common during late fall and winter when the days are shorter and most people spend less time outdoors. This is why SAD is also known as the "winter blues." SAD occurs less commonly in the spring or summer. SAD can be mild to severe, and it can interfere with work, school, relationships, and normal daily activities. What are the causes? The cause of this condition is not known. It may be related to changes in brain chemistry that are caused by having less exposure to daylight. What increases the risk? You are more likely to develop this condition if:  You are male.  You live far [place] or far [place] of the equator. These areas get less sunlight and have longer winter seasons.  You have a personal history of depression or bipolar disorder.  You have a family history of mental health conditions. What are the signs or symptoms? Symptoms of this condition include:  Depressed mood, which may involve: ? Feeling sad or teary. ? Having crying spells.  Irritability.  Trouble sleeping, or sleeping more than usual.  Loss of interest in activities that you usually enjoy.  Feelings of guilt or worthlessness.  Restlessness or loss of energy.  Difficulty concentrating, remembering, or making decisions.  Significant change in appetite or weight.  Thinking about self-harm or attempting suicide. Symptoms associated with the winter pattern of SAD include:  Overeating or craving sweet foods.  Weight gain.  Avoiding social situations (social withdrawal), or feeling like "hibernating."  Sleeping more than usual. Symptoms associated with the less common summer pattern of SAD include:  Loss of appetite.  Weight loss.  Trouble sleeping.  Episodes of violent behavior (in severe  cases). How is this diagnosed? This condition is usually diagnosed through an assessment with your health care provider. You will be asked about your moods, thoughts, and behaviors. You will also be asked about your medical history, any major life changes, and any medicines and substances that you use. You may have a physical exam and blood tests to rule out other possible causes of your symptoms. You may be referred to a mental health specialist for more evaluation. How is this treated? Treatment for this condition may include:  Light therapy. This therapy involves sitting in front of a light source for 15-30 minutes every day. The light source may be: ? A light box. ? A dawn simulator or sunrise clock. This is a timer-activated light source that copies the sunrise by slowly becoming brighter. This can help to activate your body's internal clock.  Antidepressant medicine.  Cognitive behavioral therapy (CBT). CBT is a form of talk therapy that helps to identify and change negative thoughts that are associated with SAD.  Changes to your dietary, exercise, or sleeping habits. A healthy lifestyle may help to prevent or relieve symptoms.   Follow these instructions at home: Medicines  Take over-the-counter and prescription medicines only as told by your healthcare provider.  If you are taking antidepressant medicines, ask your health care provider what side effects you should be aware of.  Talk with your health care provider before you start taking any new prescription or over-the-counter medicines, herbs, or supplements. Lifestyle  Eat a healthy diet that includes fruits and vegetables, whole grains, and lean proteins.  Get plenty of sleep. To improve your sleep, make sure  you: ? Keep your bedroom dark and cool. ? Go to sleep and wake up at about the same time every day. ? Do not keep screens (such as a TV or smartphone) in your bedroom. Limit your screen time starting a few hours before  bedtime.  Exercise regularly.  Limit alcohol and caffeine as told by your health care provider. General instructions  Make your home and work environment as sunny or bright as possible. Open window blinds and move furniture closer to windows.  Spend as much time outside as possible.  Use light therapy for 15-30 minutes every day, or as often as directed.  Attend CBT therapy sessions as directed.  Keep all follow-up visits as told by your health care provider and therapist. This is important. Contact a health care provider if:  Your symptoms do not get better or they get worse.  You have trouble taking care of yourself.  You are using drugs or alcohol to cope with your symptoms.  You have side effects from medicines. Get help right away if:  You have thoughts about hurting yourself or others. If you ever feel like you may hurt yourself or others, or have thoughts about taking your own life, get help right away. You can go to your nearest emergency department or call:  Your local emergency services (911 in the [place]  A suicide crisis helpline, such as the National Suicide Prevention Lifeline at 586-567-6947. This is open 24 hours a day. Summary  Seasonal affective disorder (SAD) is a type of depression that is associated with specific times of the year (usually fall and winter).  This condition may be treated with light therapy, talk therapy, and antidepressant medicines.  To help treat your condition, take good care of yourself and make home and work as sunny and bright as possible.  Seek help right away if you have thoughts about hurting yourself or others. This information is not intended to replace advice given to you by your health care provider. Make sure you discuss any questions you have with your health care provider. Document Revised: 04/07/2020 Document Reviewed: 04/07/2020 Elsevier Patient Education  2021 [employer].

## 2021-01-30 NOTE — Assessment & Plan Note (Signed)
hgba1c acceptable, minimize simple carbs. Increase exercise as tolerated.  

## 2021-01-30 NOTE — Assessment & Plan Note (Signed)
His Adderall has been wearing off faster than previously. Will try changing to the XR version. Try Adderall XR 20 mg tabs, 1 tab po tid but he reminded that is the maximum dose.

## 2021-01-30 NOTE — Assessment & Plan Note (Signed)
Encouraged heart healthy diet, increase exercise, avoid trans fats, consider a krill oil cap daily 

## 2021-01-30 NOTE — Assessment & Plan Note (Signed)
RRR today 

## 2021-01-30 NOTE — Assessment & Plan Note (Signed)
Left TM is red, dull with some puss behind and mild bulging. Started on a Zpak and he will report if he does not respond.

## 2021-01-30 NOTE — Assessment & Plan Note (Signed)
He notes a spike in anxiety recently but he acknowledges he stays up late playing video games and drinking a good deal of coffee. He is advised to get into a regular sleep pattern with 7-8 hours a night, get regular exercise, eat a heart healthy diet, continue current meds for now and will consider more changes next month if no improvement.

## 2021-01-30 NOTE — Telephone Encounter (Signed)
Patient called, states that medication is not down stairs, please resend the xanax medication to med center

## 2021-01-30 NOTE — Progress Notes (Signed)
Subjective:    Patient ID: Jesse Jones, male    DOB: Nov 14, 1980, 40 y.o.   MRN: [id number]  Chief Complaint  Patient presents with  . Anxiety    Getting high   . left ear has some fluid in it  . ADD    Sometimes double Adderall    HPI Patient is in today for follow up on chronic medical concerns. No recent febrile illness or hospitalizations. He is accompanied by his father and they both report his anxiety and irritability have been worse recently. He has been gaming and not sleeping much. He feels his Adderall is wearing off faster than it used to. Notes some anhedonia but does not endorse suicidal ideation. Denies CP/palp/SOB/HA/congestion/fevers/GI or GU c/o. Taking meds as prescribed  Past Medical History:  Diagnosis Date  . ADD (attention deficit disorder)   . ADHD (attention deficit hyperactivity disorder) 04/02/2011  . 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 Anxiety   . 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 Concussion    X 6- 7  . 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 Depression   . ED (erectile dysfunction) 05/12/2012  . Hyperlipidemia, mild 04/18/2015  . Insomnia   . 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 Outbursts of anger   . Preventative health care 10/03/2011  . Tobacco abuse 04/28/2011    Past Surgical History:  Procedure Laterality Date  . NASAL SEPTUM SURGERY Bilateral    Dr Jenne Pane 2017  . punctured lung    . toe surgeries  during childhood    for ingrown toenails    Family History  Problem Relation Age of Onset  . Depression Mother   . Anxiety disorder Mother   . Depression Brother   . Diabetes Brother        type 1  . Cancer Paternal Grandmother        lung/ didn't smoke  . Cancer Maternal Uncle 55       colon cancer    Social History   Socioeconomic History  . Marital status: Single    Spouse name: Not on file  . Number of children: Not on file  . Years of education: Not on file  . Highest education level: Not on file  Occupational  History  . Not on file  Tobacco Use  . Smoking status: Former Smoker    Packs/day: 0.30    Years: 20.00    Pack years: 6.00    Types: Cigarettes  . Smokeless tobacco: Never Used  Vaping Use  . Vaping Use: Never used  Substance and Sexual Activity  . Alcohol use: Yes    Alcohol/week: 36.0 standard drinks    Types: 36 Cans of beer per week    Comment: sober x32yr. No alcohol.  . Drug use: Yes    Types: Marijuana, Other-see comments    Comment: pt stopped 4 year ago  . Sexual activity: Yes    Partners: Female    Comment: lives alone and eating well. exercising  Other Topics Concern  . Not on file  Social History Narrative  . Not on file   Social Determinants of Health   Financial Resource Strain: Not on file  Food Insecurity: Not on file  Transportation Needs: Not on file  Physical Activity: Not on file  Stress: Not on file  Social Connections: Not on file  Intimate Partner Violence: Not on file    Outpatient Medications Prior to Visit  Medication Sig Dispense Refill  . OLANZapine (  ZYPREXA) 5 MG tablet Take 1 tablet (5 mg total) by mouth at bedtime. 30 tablet 3  . ALPRAZolam (XANAX XR) 3 MG 24 hr tablet Take 1 tablet (3 mg total) by mouth every morning. 3 tablet 0  . cholecalciferol (VITAMIN D) 1000 UNITS tablet Take 1,000 Units by mouth every morning.    . citalopram (CELEXA) 20 MG tablet TAKE 1 TABLET BY MOUTH TWICE A DAY 180 tablet 0  . clindamycin-benzoyl peroxide (BENZACLIN) gel Apply topically 2 (two) times daily. 25 g 2  . fish oil-omega-3 fatty acids 1000 MG capsule Take 2 g by mouth daily.    . furosemide (LASIX) 20 MG tablet TAKE 1 TABLET BY MOUTH EVERY DAY AS NEEDED 90 tablet 1  . methocarbamol (ROBAXIN) 500 MG tablet Take 1 tablet (500 mg total) by mouth 2 (two) times daily. 20 tablet 0  . Multiple Vitamin (MULTIVITAMIN) tablet Take 1 tablet by mouth every morning.     . vitamin B-12 (CYANOCOBALAMIN) 1000 MCG tablet Take 1,000 mcg by mouth every morning.    .  vitamin C (ASCORBIC ACID) 500 MG tablet Take 500 mg by mouth daily.    Marland Kitchen amphetamine-dextroamphetamine (ADDERALL) 20 MG tablet Take 1 tablet (20 mg total) by mouth 3 (three) times daily. May 2022 90 tablet 0  . amphetamine-dextroamphetamine (ADDERALL) 20 MG tablet Take 1 tablet (20 mg total) by mouth in the morning, at noon, and at bedtime. April 2022 90 tablet 0  . amphetamine-dextroamphetamine (ADDERALL) 20 MG tablet Take 1 tablet (20 mg total) by mouth in the morning, at noon, and at bedtime. March 2022 90 tablet 0  . fluticasone (FLONASE) 50 MCG/ACT nasal spray Place 2 sprays into both nostrils daily as needed for allergies or rhinitis. 16 g 6   No facility-administered medications prior to visit.    Allergies  Allergen Reactions  . Haloperidol Decanoate   . Hydroxyzine Anxiety    EXCESSIVE ANXIOUNESS  . Other Other (See Comments)    Reverse effect, super hyper  . Penicillins Hives  . Valium     Review of Systems  Constitutional: Negative for fever and malaise/fatigue.  HENT: Negative for congestion.   Eyes: Negative for blurred vision.  Respiratory: Negative for shortness of breath.   Cardiovascular: Negative for chest pain, palpitations and leg swelling.  Gastrointestinal: Negative for abdominal pain, blood in stool and nausea.  Genitourinary: Negative for dysuria and frequency.  Musculoskeletal: Negative for falls.  Skin: Negative for rash.  Neurological: Negative for dizziness, loss of consciousness and headaches.  Endo/Heme/Allergies: Negative for environmental allergies.  Psychiatric/Behavioral: Negative for depression. The patient is nervous/anxious and has insomnia.        Objective:    Physical Exam Vitals and nursing note reviewed.  Constitutional:      General: He is not in acute distress.    Appearance: He is well-developed.  HENT:     Head: Normocephalic and atraumatic.     Right Ear: Tympanic membrane normal.     Ears:     Comments: Left TM  erythematous, dull with puss behind membrane and mild bulging.     Nose: Nose normal.  Eyes:     General:        Right eye: No discharge.        Left eye: No discharge.  Cardiovascular:     Rate and Rhythm: Normal rate and regular rhythm.     Heart sounds: No murmur heard.   Pulmonary:     Effort: Pulmonary  effort is normal.     Breath sounds: Normal breath sounds.  Abdominal:     General: Bowel sounds are normal.     Palpations: Abdomen is soft.     Tenderness: There is no abdominal tenderness.  Musculoskeletal:     Cervical back: Normal range of motion and neck supple.  Skin:    General: Skin is warm and dry.  Neurological:     Mental Status: He is alert and oriented to person, place, and time.     BP 110/76 (BP Location: Right Arm, Cuff Size: Normal)   Pulse 73   Temp 98.1 F (36.7 C) (Oral)   Resp 12   Ht 6\' 2"  (1.88 m)   Wt 194 lb 12.8 oz (88.4 kg)   SpO2 95%   BMI 25.01 kg/m  Wt Readings from Last 3 Encounters:  01/30/21 194 lb 12.8 oz (88.4 kg)  12/01/20 196 lb (88.9 kg)  06/23/20 195 lb 6.4 oz (88.6 kg)    Diabetic Foot Exam - Simple   No data filed    Lab Results  Component Value Date   WBC 8.3 06/23/2020   HGB 15.7 06/23/2020   HCT 46.1 06/23/2020   PLT 279 06/23/2020   GLUCOSE 91 06/23/2020   CHOL 205 (H) 06/23/2020   TRIG 74 06/23/2020   HDL 63 06/23/2020   LDLCALC 125 (H) 06/23/2020   ALT 14 06/23/2020   AST 22 06/23/2020   NA 135 06/23/2020   K 4.8 06/23/2020   CL 99 06/23/2020   CREATININE 1.19 06/23/2020   BUN 16 06/23/2020   CO2 26 06/23/2020   TSH 1.71 06/23/2020   HGBA1C 5.0 06/23/2020    Lab Results  Component Value Date   TSH 1.71 06/23/2020   Lab Results  Component Value Date   WBC 8.3 06/23/2020   HGB 15.7 06/23/2020   HCT 46.1 06/23/2020   MCV 86.7 06/23/2020   PLT 279 06/23/2020   Lab Results  Component Value Date   NA 135 06/23/2020   K 4.8 06/23/2020   CO2 26 06/23/2020   GLUCOSE 91 06/23/2020   BUN 16  06/23/2020   CREATININE 1.19 06/23/2020   BILITOT 1.0 06/23/2020   ALKPHOS 73 09/30/2018   AST 22 06/23/2020   ALT 14 06/23/2020   PROT 7.7 06/23/2020   ALBUMIN 5.1 09/30/2018   CALCIUM 10.0 06/23/2020   GFR 88.78 09/30/2018   Lab Results  Component Value Date   CHOL 205 (H) 06/23/2020   Lab Results  Component Value Date   HDL 63 06/23/2020   Lab Results  Component Value Date   LDLCALC 125 (H) 06/23/2020   Lab Results  Component Value Date   TRIG 74 06/23/2020   Lab Results  Component Value Date   CHOLHDL 3.3 06/23/2020   Lab Results  Component Value Date   HGBA1C 5.0 06/23/2020       Assessment & Plan:   Problem List Items Addressed This Visit    ADHD (attention deficit hyperactivity disorder)    His Adderall has been wearing off faster than previously. Will try changing to the XR version. Try Adderall XR 20 mg tabs, 1 tab po tid but he reminded that is the maximum dose.       Anxiety    He notes a spike in anxiety recently but he acknowledges he stays up late playing video games and drinking a good deal of coffee. He is advised to get into a regular sleep pattern with  7-8 hours a night, get regular exercise, eat a heart healthy diet, continue current meds for now and will consider more changes next month if no improvement.       Tachycardia    RRR today      Relevant Orders   CBC   Comprehensive metabolic panel   TSH   Hyperlipidemia, mild - Primary    Encouraged heart healthy diet, increase exercise, avoid trans fats, consider a krill oil cap daily      Relevant Orders   Lipid panel   Hyperglycemia    hgba1c acceptable, minimize simple carbs. Increase exercise as tolerated.       Relevant Orders   Hemoglobin A1c   LOM (left otitis media)    Left TM is red, dull with some puss behind and mild bulging. Started on a Zpak and he will report if he does not respond.       Relevant Medications   azithromycin (ZITHROMAX) 250 MG tablet    Other  Visit Diagnoses    Need for Tdap vaccination       Relevant Orders   Tdap vaccine greater than or equal to 7yo IM (Completed)      I have discontinued Jesse KielMatthew A. Ellena "Matt"'s amphetamine-dextroamphetamine, amphetamine-dextroamphetamine, and amphetamine-dextroamphetamine. I am also having him start on amphetamine-dextroamphetamine and azithromycin. Additionally, I am having him maintain his multivitamin, vitamin B-12, cholecalciferol, fish oil-omega-3 fatty acids, vitamin C, clindamycin-benzoyl peroxide, furosemide, methocarbamol, citalopram, OLANZapine, ALPRAZolam, and fluticasone.  Meds ordered this encounter  Medications  . amphetamine-dextroamphetamine (ADDERALL XR) 20 MG 24 hr capsule    Sig: Take 1 capsule (20 mg total) by mouth in the morning, at noon, and at bedtime. April 2022    Dispense:  90 capsule    Refill:  0  . azithromycin (ZITHROMAX) 250 MG tablet    Sig: 2 tabs po once and 1 tab po daily    Dispense:  6 tablet    Refill:  0  . DISCONTD: fluticasone (FLONASE) 50 MCG/ACT nasal spray    Sig: Place 2 sprays into both nostrils daily as needed for allergies or rhinitis.    Dispense:  16 g    Refill:  6  . fluticasone (FLONASE) 50 MCG/ACT nasal spray    Sig: Place 2 sprays into both nostrils daily as needed for allergies or rhinitis.    Dispense:  16 g    Refill:  6     Danise EdgeStacey Sydnei Ohaver, MD

## 2021-01-31 LAB — CBC
HCT: 45.5 % (ref 39.0–52.0)
Hemoglobin: 15.6 g/dL (ref 13.0–17.0)
MCHC: 34.4 g/dL (ref 30.0–36.0)
MCV: 84.5 fl (ref 78.0–100.0)
Platelets: 281 10*3/uL (ref 150.0–400.0)
RBC: 5.38 Mil/uL (ref 4.22–5.81)
RDW: 13.6 % (ref 11.5–15.5)
WBC: 8.7 10*3/uL (ref 4.0–10.5)

## 2021-01-31 LAB — COMPREHENSIVE METABOLIC PANEL
ALT: 9 U/L (ref 0–53)
AST: 15 U/L (ref 0–37)
Albumin: 4.8 g/dL (ref 3.5–5.2)
Alkaline Phosphatase: 70 U/L (ref 39–117)
BUN: 14 mg/dL (ref 6–23)
CO2: 31 mEq/L (ref 19–32)
Calcium: 10.1 mg/dL (ref 8.4–10.5)
Chloride: 102 mEq/L (ref 96–112)
Creatinine, Ser: 1.05 mg/dL (ref 0.40–1.50)
GFR: 90.09 mL/min (ref >60.00)
Glucose, Bld: 73 mg/dL (ref 70–99)
Potassium: 4.7 mEq/L (ref 3.5–5.1)
Sodium: 141 mEq/L (ref 135–145)
Total Bilirubin: 0.6 mg/dL (ref 0.2–1.2)
Total Protein: 7.3 g/dL (ref 6.0–8.3)

## 2021-01-31 LAB — LIPID PANEL
Cholesterol: 203 mg/dL — ABNORMAL HIGH (ref 0–200)
HDL: 57.9 mg/dL (ref >39.00)
LDL Cholesterol: 119 mg/dL — ABNORMAL HIGH (ref 0–99)
NonHDL: 145.57
Total CHOL/HDL Ratio: 4
Triglycerides: 131 mg/dL (ref 0.0–149.0)
VLDL: 26.2 mg/dL (ref 0.0–40.0)

## 2021-01-31 LAB — TSH: TSH: 1.01 u[IU]/mL (ref 0.35–4.50)

## 2021-01-31 LAB — HEMOGLOBIN A1C: Hgb A1c MFr Bld: 5.2 % (ref 4.6–6.5)

## 2021-02-01 ENCOUNTER — Telehealth: Payer: Self-pay | Admitting: *Deleted

## 2021-02-01 NOTE — Telephone Encounter (Signed)
error 

## 2021-02-01 NOTE — Telephone Encounter (Signed)
PA Case: [id number], Status: Approved, Coverage Starts on: 10/29/2020 12:00:00 AM, Coverage Ends on: 10/28/2021 12:00:00 AM.  Key: [id number]

## 2021-02-02 ENCOUNTER — Other Ambulatory Visit (HOSPITAL_BASED_OUTPATIENT_CLINIC_OR_DEPARTMENT_OTHER): Payer: Self-pay

## 2021-02-02 MED FILL — Alprazolam Tab ER 24HR 3 MG: ORAL | 30 days supply | Qty: 30 | Fill #0 | Status: AC

## 2021-02-12 ENCOUNTER — Other Ambulatory Visit: Payer: Self-pay | Admitting: Family Medicine

## 2021-02-24 ENCOUNTER — Telehealth: Payer: Self-pay | Admitting: Family Medicine

## 2021-02-24 NOTE — Telephone Encounter (Signed)
He is only on Xanax XR 3 mg tabs there is no 2.5 or 5 mg strength so not sure what to make of this? Please get some clarity

## 2021-02-24 NOTE — Telephone Encounter (Signed)
Patient states he went to pick up medication from pharmacy , patient states he received Xanax @ 2.5 MG instead of 5MG . Patient wanted to make Dr aware and would like to know if someone can call him back to let him know if ok to take two pills to make 5MG 

## 2021-02-24 NOTE — Telephone Encounter (Signed)
Can you clarify the medication for the pt

## 2021-02-24 NOTE — Telephone Encounter (Signed)
Thanks does he need [place] to transfer the prescription or will he go get it?

## 2021-02-24 NOTE — Telephone Encounter (Signed)
Spoke with pt and it was not xanax it was zyprexa 5 mg. They gave him 2.5 mg tablet, spoke with pharmacy and he sent the 5 mg script over to [facility] and they had the script for the 2.5 mg there at the [facility]. I made Matt aware of everything and told him could take 2 of the 2.5 mg until they run out.

## 2021-02-27 ENCOUNTER — Other Ambulatory Visit (HOSPITAL_BASED_OUTPATIENT_CLINIC_OR_DEPARTMENT_OTHER): Payer: Self-pay

## 2021-02-27 MED FILL — Alprazolam Tab ER 24HR 3 MG: ORAL | 30 days supply | Qty: 30 | Fill #1 | Status: CN

## 2021-02-27 NOTE — Telephone Encounter (Signed)
No, he will do it when he runs out the 2.5 mg tablets

## 2021-02-28 ENCOUNTER — Telehealth: Payer: Self-pay | Admitting: Family Medicine

## 2021-02-28 ENCOUNTER — Other Ambulatory Visit: Payer: Self-pay | Admitting: Family Medicine

## 2021-02-28 MED ORDER — AMPHETAMINE-DEXTROAMPHET ER 20 MG PO CP24: 20.0000 mg | ORAL_CAPSULE | Freq: Three times a day (TID) | ORAL | 0 refills | Status: DC

## 2021-02-28 NOTE — Telephone Encounter (Signed)
done

## 2021-02-28 NOTE — Telephone Encounter (Signed)
Medication: amphetamine-dextroamphetamine (ADDERALL XR) 20 MG 24 hr capsule [[id number]]    Has the patient contacted their pharmacy? No. (If no, request that the patient contact the pharmacy for the refill.) (If yes, when and what did the pharmacy advise?)  Preferred Pharmacy (with phone number or street name):  [id number] - [place], [place] - [address] AT [facility] [address] [facility] [address]  [address] [place] [address]  Phone:  [phone] Fax:  [phone]  DEA #:  [id number]  DAW Reason: --     Agent: Please be advised that RX refills may take up to 3 business days. We ask that you follow-up with your pharmacy.

## 2021-02-28 NOTE — Telephone Encounter (Signed)
Requesting:adderall xr 20mg  Contract: 06/23/20 UDS:12/24/20 Last Visit:01/30/21 Next Visit:03/02/21 Last Refill: 01/30/21  Please Advise

## 2021-02-28 NOTE — Telephone Encounter (Signed)
See below

## 2021-03-02 ENCOUNTER — Telehealth (INDEPENDENT_AMBULATORY_CARE_PROVIDER_SITE_OTHER): Payer: Medicare Other | Admitting: Family Medicine

## 2021-03-02 ENCOUNTER — Other Ambulatory Visit: Payer: Self-pay

## 2021-03-02 DIAGNOSIS — F908 Attention-deficit hyperactivity disorder, other type: Secondary | ICD-10-CM

## 2021-03-02 DIAGNOSIS — F3160 Bipolar disorder, current episode mixed, unspecified: Secondary | ICD-10-CM | POA: Diagnosis not present

## 2021-03-02 DIAGNOSIS — R739 Hyperglycemia, unspecified: Secondary | ICD-10-CM | POA: Diagnosis not present

## 2021-03-02 DIAGNOSIS — F419 Anxiety disorder, unspecified: Secondary | ICD-10-CM

## 2021-03-02 MED ORDER — AMPHETAMINE-DEXTROAMPHET ER 20 MG PO CP24: 20.0000 mg | ORAL_CAPSULE | Freq: Three times a day (TID) | ORAL | 0 refills | Status: DC

## 2021-03-03 ENCOUNTER — Other Ambulatory Visit (HOSPITAL_BASED_OUTPATIENT_CLINIC_OR_DEPARTMENT_OTHER): Payer: Self-pay

## 2021-03-03 MED FILL — Alprazolam Tab ER 24HR 3 MG: ORAL | 30 days supply | Qty: 30 | Fill #1 | Status: AC

## 2021-03-03 NOTE — Progress Notes (Signed)
MyChart Video Visit    Virtual Visit via Video Note   This visit type was conducted due to national recommendations for restrictions regarding the COVID-19 Pandemic (e.g. social distancing) in an effort to limit this patient's exposure and mitigate transmission in our community. This patient is at least at moderate risk for complications without adequate follow up. This format is felt to be most appropriate for this patient at this time. Physical exam was limited by quality of the video and audio technology used for the visit. S Chism, CMA was able to get the patient set up on a video visit.  Patient location: home Patient and provider in visit Provider location: Office  I discussed the limitations of evaluation and management by telemedicine and the availability of in person appointments. The patient expressed understanding and agreed to proceed.  Visit Date: 03/02/2021  Today's healthcare provider: Danise EdgeStacey Dunia Pringle, MD     Subjective:    Patient ID: Jesse KielMatthew A Jones, male    DOB: October 01, 1981, 40 y.o.   MRN: [id number]  Chief Complaint  Patient presents with  . Follow-up  . Medication Refill    Pt has no concerns or problem    HPI Patient is in today for follow up and he reports he is doing well. No recent febrile illness or hospitalization. He is doing well on his current Zyprexa dose of 5 mg. His switch to Adderall XR 20 mg po tid has also been well tolerated and he feels it is working well. Denies CP/palp/SOB/HA/congestion/fevers/GI or GU c/o. Taking meds as prescribed  Past Medical History:  Diagnosis Date  . ADD (attention deficit disorder)   . ADHD (attention deficit hyperactivity disorder) 04/02/2011  . 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 Anxiety   . 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 Concussion    X 6- 7  . 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 Depression   . ED (erectile dysfunction) 05/12/2012  . Hyperlipidemia, mild 04/18/2015  . Insomnia   . Nasal septal  deviation 08/13/2015  . Outbursts of anger   . Preventative health care 10/03/2011  . Tobacco abuse 04/28/2011    Past Surgical History:  Procedure Laterality Date  . NASAL SEPTUM SURGERY Bilateral    Dr Jenne PaneBates 2017  . punctured lung    . toe surgeries  during childhood    for ingrown toenails    Family History  Problem Relation Age of Onset  . Depression Mother   . Anxiety disorder Mother   . Depression Brother   . Diabetes Brother        type 1  . Cancer Paternal Grandmother        lung/ didn't smoke  . Cancer Maternal Uncle [id number]       colon cancer    Social History   Socioeconomic History  . Marital status: Single    Spouse name: Not on file  . Number of children: Not on file  . Years of education: Not on file  . Highest education level: Not on file  Occupational History  . Not on file  Tobacco Use  . Smoking status: Former Smoker    Packs/day: 0.30    Years: 20.00    Pack years: 6.00    Types: Cigarettes  . Smokeless tobacco: Never Used  Vaping Use  . Vaping Use: Never used  Substance and Sexual Activity  . Alcohol use: Yes    Alcohol/week: 36.0 standard drinks    Types: 36 Cans of  beer per week    Comment: sober x35yr. No alcohol.  . Drug use: Yes    Types: Marijuana, Other-see comments    Comment: pt stopped 4 year ago  . Sexual activity: Yes    Partners: Female    Comment: lives alone and eating well. exercising  Other Topics Concern  . Not on file  Social History Narrative  . Not on file   Social Determinants of Health   Financial Resource Strain: Not on file  Food Insecurity: Not on file  Transportation Needs: Not on file  Physical Activity: Not on file  Stress: Not on file  Social Connections: Not on file  Intimate Partner Violence: Not on file    Outpatient Medications Prior to Visit  Medication Sig Dispense Refill  . ALPRAZolam (XANAX XR) 3 MG 24 hr tablet Take 1 tablet (3 mg total) by mouth every morning. 3 tablet 0  .  cholecalciferol (VITAMIN D) 1000 UNITS tablet Take 1,000 Units by mouth every morning.    . citalopram (CELEXA) 20 MG tablet TAKE 1 TABLET BY MOUTH TWICE A DAY 180 tablet 1  . clindamycin-benzoyl peroxide (BENZACLIN) gel Apply topically 2 (two) times daily. 25 g 2  . fish oil-omega-3 fatty acids 1000 MG capsule Take 2 g by mouth daily.    . fluticasone (FLONASE) 50 MCG/ACT nasal spray Place 2 sprays into both nostrils daily as needed for allergies or rhinitis. 16 g 6  . furosemide (LASIX) 20 MG tablet TAKE 1 TABLET BY MOUTH EVERY DAY AS NEEDED 90 tablet 1  . methocarbamol (ROBAXIN) 500 MG tablet Take 1 tablet (500 mg total) by mouth 2 (two) times daily. 20 tablet 0  . Multiple Vitamin (MULTIVITAMIN) tablet Take 1 tablet by mouth every morning.    Marland Kitchen OLANZapine (ZYPREXA) 5 MG tablet Take 1 tablet (5 mg total) by mouth at bedtime. 30 tablet 3  . vitamin B-12 (CYANOCOBALAMIN) 1000 MCG tablet Take 1,000 mcg by mouth every morning.    . vitamin C (ASCORBIC ACID) 500 MG tablet Take 500 mg by mouth daily.    Marland Kitchen ALPRAZolam (XANAX XR) 3 MG 24 hr tablet TAKE 1 TABLET (3 MG TOTAL) BY MOUTH EVERY MORNING. 30 tablet 3  . ALPRAZolam (XANAX XR) 3 MG 24 hr tablet TAKE 1 TABLET (3 MG TOTAL) BY MOUTH EVERY MORNING. 30 tablet 3  . amphetamine-dextroamphetamine (ADDERALL XR) 20 MG 24 hr capsule Take 1 capsule (20 mg total) by mouth in the morning, at noon, and at bedtime. May 2022 90 capsule 0   No facility-administered medications prior to visit.    Allergies  Allergen Reactions  . Haloperidol Decanoate   . Hydroxyzine Anxiety    EXCESSIVE ANXIOUNESS  . Other Other (See Comments)    Reverse effect, super hyper  . Penicillins Hives  . Valium     Review of Systems  Constitutional: Negative for fever and malaise/fatigue.  HENT: Negative for congestion.   Eyes: Negative for blurred vision.  Respiratory: Negative for shortness of breath.   Cardiovascular: Negative for chest pain, palpitations and leg  swelling.  Gastrointestinal: Negative for abdominal pain, blood in stool and nausea.  Genitourinary: Negative for dysuria and frequency.  Musculoskeletal: Negative for falls.  Skin: Negative for rash.  Neurological: Negative for dizziness, loss of consciousness and headaches.  Endo/Heme/Allergies: Negative for environmental allergies.  Psychiatric/Behavioral: Negative for depression. The patient is nervous/anxious.        Objective:    Physical Exam Constitutional:  Appearance: Normal appearance. He is not ill-appearing.  HENT:     Head: Normocephalic and atraumatic.     Right Ear: External ear normal.     Left Ear: External ear normal.  Pulmonary:     Effort: Pulmonary effort is normal.  Musculoskeletal:     Cervical back: No rigidity.  Neurological:     Mental Status: He is alert and oriented to person, place, and time.  Psychiatric:        Behavior: Behavior normal.     There were no vitals taken for this visit. Wt Readings from Last 3 Encounters:  01/30/21 194 lb 12.8 oz (88.4 kg)  12/01/20 196 lb (88.9 kg)  06/23/20 195 lb 6.4 oz (88.6 kg)    Diabetic Foot Exam - Simple   No data filed    Lab Results  Component Value Date   WBC 8.7 01/30/2021   HGB 15.6 01/30/2021   HCT 45.5 01/30/2021   PLT 281.0 01/30/2021   GLUCOSE 73 01/30/2021   CHOL 203 (H) 01/30/2021   TRIG 131.0 01/30/2021   HDL 57.90 01/30/2021   LDLCALC 119 (H) 01/30/2021   ALT 9 01/30/2021   AST 15 01/30/2021   NA 141 01/30/2021   K 4.7 01/30/2021   CL 102 01/30/2021   CREATININE 1.05 01/30/2021   BUN 14 01/30/2021   CO2 31 01/30/2021   TSH 1.01 01/30/2021   HGBA1C 5.2 01/30/2021    Lab Results  Component Value Date   TSH 1.01 01/30/2021   Lab Results  Component Value Date   WBC 8.7 01/30/2021   HGB 15.6 01/30/2021   HCT 45.5 01/30/2021   MCV 84.5 01/30/2021   PLT 281.0 01/30/2021   Lab Results  Component Value Date   NA 141 01/30/2021   K 4.7 01/30/2021   CO2 31  01/30/2021   GLUCOSE 73 01/30/2021   BUN 14 01/30/2021   CREATININE 1.05 01/30/2021   BILITOT 0.6 01/30/2021   ALKPHOS 70 01/30/2021   AST 15 01/30/2021   ALT 9 01/30/2021   PROT 7.3 01/30/2021   ALBUMIN 4.8 01/30/2021   CALCIUM 10.1 01/30/2021   GFR 90.09 01/30/2021   Lab Results  Component Value Date   CHOL 203 (H) 01/30/2021   Lab Results  Component Value Date   HDL 57.90 01/30/2021   Lab Results  Component Value Date   LDLCALC 119 (H) 01/30/2021   Lab Results  Component Value Date   TRIG 131.0 01/30/2021   Lab Results  Component Value Date   CHOLHDL 4 01/30/2021   Lab Results  Component Value Date   HGBA1C 5.2 01/30/2021       Assessment & Plan:   Problem List Items Addressed This Visit    ADHD (attention deficit hyperactivity disorder)    Doing well on the switch to Adderall XR 20 mg po tid. No changes today      Anxiety    Continues to use Xanax XR 3 mg tabs, 1 tab po daily and is doing well on it. No changes      Bipolar disorder, mixed (HCC)    Doing well on Zyprexa 5 mg qhs.       Hyperglycemia    hgba1c acceptable, minimize simple carbs. Increase exercise as tolerated.          I am having Jesse Jones "Jesse Jones" start on amphetamine-dextroamphetamine and amphetamine-dextroamphetamine. I am also having him maintain his multivitamin, vitamin B-12, cholecalciferol, fish oil-omega-3 fatty acids, vitamin C, clindamycin-benzoyl  peroxide, furosemide, methocarbamol, OLANZapine, ALPRAZolam, fluticasone, citalopram, and amphetamine-dextroamphetamine.  Meds ordered this encounter  Medications  . amphetamine-dextroamphetamine (ADDERALL XR) 20 MG 24 hr capsule    Sig: Take 1 capsule (20 mg total) by mouth in the morning, at noon, and at bedtime. May 2022    Dispense:  90 capsule    Refill:  0  . amphetamine-dextroamphetamine (ADDERALL XR) 20 MG 24 hr capsule    Sig: Take 1 capsule (20 mg total) by mouth in the morning, at noon, and at bedtime. June  2022    Dispense:  90 capsule    Refill:  0  . amphetamine-dextroamphetamine (ADDERALL XR) 20 MG 24 hr capsule    Sig: Take 1 capsule (20 mg total) by mouth in the morning, at noon, and at bedtime. July 2022    Dispense:  90 capsule    Refill:  0    I discussed the assessment and treatment plan with the patient. The patient was provided an opportunity to ask questions and all were answered. The patient agreed with the plan and demonstrated an understanding of the instructions.   The patient was advised to call back or seek an in-person evaluation if the symptoms worsen or if the condition fails to improve as anticipated.  I provided 15 minutes of face-to-face time during this encounter.   Danise Edge, MD [facility] [phone] (phone) [phone] (fax)  [facility]

## 2021-03-03 NOTE — Assessment & Plan Note (Signed)
Doing well on the switch to Adderall XR 20 mg po tid. No changes today

## 2021-03-03 NOTE — Assessment & Plan Note (Signed)
Doing well on Zyprexa 5 mg qhs.

## 2021-03-03 NOTE — Assessment & Plan Note (Signed)
Continues to use Xanax XR 3 mg tabs, 1 tab po daily and is doing well on it. No changes

## 2021-03-03 NOTE — Assessment & Plan Note (Signed)
hgba1c acceptable, minimize simple carbs. Increase exercise as tolerated.  

## 2021-03-28 ENCOUNTER — Other Ambulatory Visit (HOSPITAL_BASED_OUTPATIENT_CLINIC_OR_DEPARTMENT_OTHER): Payer: Self-pay

## 2021-03-28 ENCOUNTER — Other Ambulatory Visit: Payer: Self-pay | Admitting: Family Medicine

## 2021-03-28 NOTE — Telephone Encounter (Signed)
Requesting: alprazolam xr Contract:  UDS: 06/23/20 Last Visit:  03/02/21 Next Visit: n/a Last Refill: 01/28/21 for 3 tabs  Please Advise

## 2021-03-29 ENCOUNTER — Other Ambulatory Visit (HOSPITAL_BASED_OUTPATIENT_CLINIC_OR_DEPARTMENT_OTHER): Payer: Self-pay

## 2021-03-29 MED ORDER — ALPRAZOLAM ER 3 MG PO TB24
ORAL_TABLET | ORAL | 3 refills | Status: DC
  Filled 2021-03-29: qty 30, 30d supply, fill #0
  Filled 2021-04-27 (×2): qty 30, 30d supply, fill #1
  Filled 2021-05-26: qty 30, 30d supply, fill #2
  Filled 2021-06-26: qty 30, 30d supply, fill #3

## 2021-03-30 ENCOUNTER — Telehealth: Payer: Self-pay | Admitting: Family Medicine

## 2021-03-30 ENCOUNTER — Other Ambulatory Visit: Payer: Self-pay | Admitting: Family Medicine

## 2021-03-30 NOTE — Telephone Encounter (Signed)
I sent him in May June and July last month

## 2021-03-30 NOTE — Telephone Encounter (Signed)
Requesting:adderall XR 20 mg Contract:06/23/20 UDS:12/24/20 Last Visit:03/02/21 Next Visit:unknown Last Refill:03/02/21  Please Advise

## 2021-03-30 NOTE — Telephone Encounter (Signed)
Medication: amphetamine-dextroamphetamine (ADDERALL XR) 20 MG 24 hr capsule   Has the patient contacted their pharmacy? Yes.   (If no, request that the patient contact the pharmacy for the refill.) (If yes, when and what did the pharmacy advise?)  Preferred Pharmacy (with phone number or street name):   [id number] [place], [place] - [address] AT [facility] [address]  [address] [place] Mar[place] [address]  Phone:  [phone] Fax:  [phone]  Agent: Please be advised that RX refills may take up to 3 business days. We ask that you follow-up with your pharmacy.

## 2021-03-31 NOTE — Telephone Encounter (Signed)
LVM to let him know his medication was sent in

## 2021-04-12 ENCOUNTER — Other Ambulatory Visit: Payer: Self-pay | Admitting: Family Medicine

## 2021-04-13 NOTE — Telephone Encounter (Signed)
Requesting: olanzapine 5mg  Contract: 03/27/2018 UDS: 06/23/2020 Last Visit: 03/02/2021 Next Visit: None Last Refill: 12/16/2020 #30 and 3RF  Please Advise

## 2021-04-24 ENCOUNTER — Other Ambulatory Visit (HOSPITAL_BASED_OUTPATIENT_CLINIC_OR_DEPARTMENT_OTHER): Payer: Self-pay

## 2021-04-27 ENCOUNTER — Telehealth: Payer: Self-pay | Admitting: Family Medicine

## 2021-04-27 ENCOUNTER — Other Ambulatory Visit (HOSPITAL_BASED_OUTPATIENT_CLINIC_OR_DEPARTMENT_OTHER): Payer: Self-pay

## 2021-04-27 NOTE — Telephone Encounter (Signed)
Needing refill for.(Patient knows its not due until the 6th. Just wanted to get a head start)   amphetamine-dextroamphetamine (ADDERALL XR) 20 MG 24 hr capsule [[id number]]

## 2021-04-28 NOTE — Telephone Encounter (Signed)
Left message on machine that should have an rx for July at pharmacy.

## 2021-05-03 ENCOUNTER — Encounter: Payer: Self-pay | Admitting: Family Medicine

## 2021-05-03 ENCOUNTER — Other Ambulatory Visit (HOSPITAL_BASED_OUTPATIENT_CLINIC_OR_DEPARTMENT_OTHER): Payer: Self-pay

## 2021-05-03 ENCOUNTER — Telehealth: Payer: Self-pay

## 2021-05-03 ENCOUNTER — Other Ambulatory Visit: Payer: Self-pay | Admitting: Family Medicine

## 2021-05-03 MED ORDER — AMPHETAMINE-DEXTROAMPHET ER 20 MG PO CP24
20.0000 mg | ORAL_CAPSULE | Freq: Three times a day (TID) | ORAL | 0 refills | Status: DC
  Filled 2021-05-03: qty 90, 30d supply, fill #0

## 2021-05-03 NOTE — Telephone Encounter (Signed)
[facility]

## 2021-05-03 NOTE — Telephone Encounter (Signed)
Pt called and stated that Adderall Rxs should go downstairs.

## 2021-05-03 NOTE — Telephone Encounter (Signed)
[facility] Patient would like medication to be send to  [facility] Phone:  [phone]  Fax:  [phone]

## 2021-05-04 ENCOUNTER — Other Ambulatory Visit: Payer: Self-pay | Admitting: Family Medicine

## 2021-05-04 ENCOUNTER — Other Ambulatory Visit (HOSPITAL_BASED_OUTPATIENT_CLINIC_OR_DEPARTMENT_OTHER): Payer: Self-pay

## 2021-05-19 ENCOUNTER — Other Ambulatory Visit: Payer: Self-pay | Admitting: Family Medicine

## 2021-05-24 ENCOUNTER — Other Ambulatory Visit (HOSPITAL_BASED_OUTPATIENT_CLINIC_OR_DEPARTMENT_OTHER): Payer: Self-pay

## 2021-05-24 ENCOUNTER — Encounter: Payer: Self-pay | Admitting: Family Medicine

## 2021-05-25 ENCOUNTER — Other Ambulatory Visit (HOSPITAL_BASED_OUTPATIENT_CLINIC_OR_DEPARTMENT_OTHER): Payer: Self-pay

## 2021-05-26 ENCOUNTER — Other Ambulatory Visit (HOSPITAL_BASED_OUTPATIENT_CLINIC_OR_DEPARTMENT_OTHER): Payer: Self-pay

## 2021-05-26 ENCOUNTER — Other Ambulatory Visit: Payer: Self-pay | Admitting: Family Medicine

## 2021-05-26 MED ORDER — AMPHETAMINE-DEXTROAMPHET ER 20 MG PO CP24: 20.0000 mg | ORAL_CAPSULE | Freq: Three times a day (TID) | ORAL | 0 refills | Status: DC

## 2021-05-31 ENCOUNTER — Other Ambulatory Visit (HOSPITAL_COMMUNITY): Payer: Self-pay

## 2021-05-31 ENCOUNTER — Other Ambulatory Visit (HOSPITAL_BASED_OUTPATIENT_CLINIC_OR_DEPARTMENT_OTHER): Payer: Self-pay

## 2021-06-14 ENCOUNTER — Telehealth: Payer: Self-pay | Admitting: Family Medicine

## 2021-06-14 ENCOUNTER — Other Ambulatory Visit (HOSPITAL_BASED_OUTPATIENT_CLINIC_OR_DEPARTMENT_OTHER): Payer: Self-pay

## 2021-06-14 NOTE — Telephone Encounter (Signed)
Patient states that his pharmacy is not letting him get his Rx #: [id number]  ALPRAZolam (XANAX XR) 3 MG 24 hr tablet refilled. He states that the pharmacy told him he should have enough to last him for the rest of the month and it is not due to be refilled yet. He states that he needs that medicine and cannot be without it. Patient stated he would come in for an appointment if needed. Please advice.

## 2021-06-14 NOTE — Telephone Encounter (Signed)
Requesting:xanax 3 mg Contract: UDS: Last Visit: Next Visit: Last Refill:  Please Advise

## 2021-06-15 NOTE — Telephone Encounter (Signed)
Mailbox full and unable to leave message

## 2021-06-15 NOTE — Telephone Encounter (Signed)
Pt was returning call from nurse regarding message left

## 2021-06-16 NOTE — Telephone Encounter (Signed)
LVM to call back.

## 2021-06-20 NOTE — Telephone Encounter (Signed)
Pt is scheduled °

## 2021-06-21 ENCOUNTER — Other Ambulatory Visit (HOSPITAL_BASED_OUTPATIENT_CLINIC_OR_DEPARTMENT_OTHER): Payer: Self-pay

## 2021-06-21 ENCOUNTER — Telehealth (INDEPENDENT_AMBULATORY_CARE_PROVIDER_SITE_OTHER): Payer: Medicare Other | Admitting: Family Medicine

## 2021-06-21 ENCOUNTER — Other Ambulatory Visit: Payer: Self-pay

## 2021-06-21 DIAGNOSIS — F419 Anxiety disorder, unspecified: Secondary | ICD-10-CM | POA: Diagnosis not present

## 2021-06-21 NOTE — Progress Notes (Signed)
MyChart Video Visit    Virtual Visit via Video Note   This visit type was conducted due to national recommendations for restrictions regarding the COVID-19 Pandemic (e.g. social distancing) in an effort to limit this patient's exposure and mitigate transmission in our community. This patient is at least at moderate risk for complications without adequate follow up. This format is felt to be most appropriate for this patient at this time. Physical exam was limited by quality of the video and audio technology used for the visit. S Chism, CMA was able to get the patient set up on a video visit.  Patient location: home Patient and provider in visit Provider location: Office  I discussed the limitations of evaluation and management by telemedicine and the availability of in person appointments. The patient expressed understanding and agreed to proceed.  Visit Date: 06/21/2021  Today's healthcare provider: Danise Edge, MD     Subjective:    Patient ID: Jesse Jones, male    DOB: Dec 24, 1980, 40 y.o.   MRN: [id number]  Chief Complaint  Patient presents with   Medication Problem    HPI Patient is in today for discussion of anxiety. He has been on Xanax XR 3 mg daily for years and has been stable. Has used his meds as directed. He was told by pharmacy he could not get a refill in a time frame he needed and he started to have anxiety attacks. He is doing well and does not offer any acute other concerns. Denies CP/palp/SOB/HA/congestion/fevers/GI or GU c/o. 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   Depression    ED (erectile dysfunction) 05/12/2012   Hyperlipidemia, mild 04/18/2015   Insomnia    Nasal septal  deviation 08/13/2015   Outbursts of anger    Preventative health care 10/03/2011   Tobacco abuse 04/28/2011    Past Surgical History:  Procedure Laterality Date   NASAL SEPTUM SURGERY Bilateral    Dr Jenne Pane 2017   punctured lung     toe surgeries  during childhood    for ingrown toenails    Family History  Problem Relation Age of Onset   Depression Mother    Anxiety disorder Mother    Depression Brother    Diabetes Brother        type 1   Cancer Paternal Grandmother        lung/ didn't smoke   Cancer Maternal Uncle 1       colon cancer    Social History   Socioeconomic History   Marital status: Single    Spouse name: Not on file   Number of children: Not on file   Years of education: Not on file   Highest education level: Not on file  Occupational History   Not on file  Tobacco Use   Smoking status: Former    Packs/day: 0.30    Years: 20.00    Pack years: 6.00    Types: Cigarettes   Smokeless tobacco: Never  Vaping Use   Vaping Use: Never used  Substance and Sexual Activity   Alcohol use: Yes    Alcohol/week: 36.0 standard drinks    Types: 36 Cans of beer  per week    Comment: sober x31yr. No alcohol.   Drug use: Yes    Types: Marijuana, Other-see comments    Comment: pt stopped 4 year ago   Sexual activity: Yes    Partners: Female    Comment: lives alone and eating well. exercising  Other Topics Concern   Not on file  Social History Narrative   Not on file   Social Determinants of Health   Financial Resource Strain: Not on file  Food Insecurity: Not on file  Transportation Needs: Not on file  Physical Activity: Not on file  Stress: Not on file  Social Connections: Not on file  Intimate Partner Violence: Not on file    Outpatient Medications Prior to Visit  Medication Sig Dispense Refill   ALPRAZolam (XANAX XR) 3 MG 24 hr tablet Take 1 tablet (3 mg total) by mouth every morning. 3 tablet 0   ALPRAZolam (XANAX XR) 3 MG 24 hr tablet TAKE 1 TABLET  (3 MG TOTAL) BY MOUTH EVERY MORNING. 30 tablet 3   amphetamine-dextroamphetamine (ADDERALL XR) 20 MG 24 hr capsule Take 1 capsule (20 mg total) by mouth in the morning, at noon, and at bedtime. October 2022 90 capsule 0   amphetamine-dextroamphetamine (ADDERALL XR) 20 MG 24 hr capsule Take 1 capsule (20 mg total) by mouth in the morning, at noon, and at bedtime. September 2022 90 capsule 0   amphetamine-dextroamphetamine (ADDERALL XR) 20 MG 24 hr capsule Take 1 capsule (20 mg total) by mouth in the morning, at noon, and at bedtime. August 2022 90 capsule 0   cholecalciferol (VITAMIN D) 1000 UNITS tablet Take 1,000 Units by mouth every morning.     citalopram (CELEXA) 20 MG tablet TAKE 1 TABLET BY MOUTH TWICE A DAY 180 tablet 1   clindamycin-benzoyl peroxide (BENZACLIN) gel Apply topically 2 (two) times daily. 25 g 2   fish oil-omega-3 fatty acids 1000 MG capsule Take 2 g by mouth daily.     fluticasone (FLONASE) 50 MCG/ACT nasal spray Place 2 sprays into both nostrils daily as needed for allergies or rhinitis. 16 g 6   furosemide (LASIX) 20 MG tablet TAKE 1 TABLET BY MOUTH EVERY DAY AS NEEDED 90 tablet 1   methocarbamol (ROBAXIN) 500 MG tablet Take 1 tablet (500 mg total) by mouth 2 (two) times daily. 20 tablet 0   Multiple Vitamin (MULTIVITAMIN) tablet Take 1 tablet by mouth every morning.     OLANZapine (ZYPREXA) 2.5 MG tablet TAKE 1 TABLET BY MOUTH AT BEDTIME. 90 tablet 1   OLANZapine (ZYPREXA) 5 MG tablet TAKE 1 TABLET BY MOUTH EVERYDAY AT BEDTIME 30 tablet 3   vitamin B-12 (CYANOCOBALAMIN) 1000 MCG tablet Take 1,000 mcg by mouth every morning.     vitamin C (ASCORBIC ACID) 500 MG tablet Take 500 mg by mouth daily.     No facility-administered medications prior to visit.    Allergies  Allergen Reactions   Haloperidol Decanoate    Hydroxyzine Anxiety    EXCESSIVE ANXIOUNESS   Other Other (See Comments)    Reverse effect, super hyper   Penicillins Hives   Valium     Review of Systems   Constitutional:  Negative for fever and malaise/fatigue.  HENT:  Negative for congestion.   Eyes:  Negative for blurred vision.  Respiratory:  Negative for shortness of breath.   Cardiovascular:  Negative for chest pain, palpitations and leg swelling.  Gastrointestinal:  Negative for abdominal pain, blood in stool and nausea.  Genitourinary:  Negative for dysuria and frequency.  Musculoskeletal:  Negative for falls.  Skin:  Negative for rash.  Neurological:  Negative for dizziness, loss of consciousness and headaches.  Endo/Heme/Allergies:  Negative for environmental allergies.  Psychiatric/Behavioral:  Negative for depression. The patient is nervous/anxious.       Objective:    Physical Exam Constitutional:      General: He is not in acute distress.    Appearance: Normal appearance. He is not ill-appearing or toxic-appearing.  HENT:     Head: Normocephalic and atraumatic.     Right Ear: External ear normal.     Left Ear: External ear normal.     Nose: Nose normal.  Eyes:     General:        Right eye: No discharge.        Left eye: No discharge.  Pulmonary:     Effort: Pulmonary effort is normal.  Skin:    Findings: No rash.  Neurological:     Mental Status: He is alert and oriented to person, place, and time.  Psychiatric:        Behavior: Behavior normal.    There were no vitals taken for this visit. Wt Readings from Last 3 Encounters:  01/30/21 194 lb 12.8 oz (88.4 kg)  12/01/20 196 lb (88.9 kg)  06/23/20 195 lb 6.4 oz (88.6 kg)    Diabetic Foot Exam - Simple   No data filed    Lab Results  Component Value Date   WBC 8.7 01/30/2021   HGB 15.6 01/30/2021   HCT 45.5 01/30/2021   PLT 281.0 01/30/2021   GLUCOSE 73 01/30/2021   CHOL 203 (H) 01/30/2021   TRIG 131.0 01/30/2021   HDL 57.90 01/30/2021   LDLCALC 119 (H) 01/30/2021   ALT 9 01/30/2021   AST 15 01/30/2021   NA 141 01/30/2021   K 4.7 01/30/2021   CL 102 01/30/2021   CREATININE 1.05 01/30/2021    BUN 14 01/30/2021   CO2 31 01/30/2021   TSH 1.01 01/30/2021   HGBA1C 5.2 01/30/2021    Lab Results  Component Value Date   TSH 1.01 01/30/2021   Lab Results  Component Value Date   WBC 8.7 01/30/2021   HGB 15.6 01/30/2021   HCT 45.5 01/30/2021   MCV 84.5 01/30/2021   PLT 281.0 01/30/2021   Lab Results  Component Value Date   NA 141 01/30/2021   K 4.7 01/30/2021   CO2 31 01/30/2021   GLUCOSE 73 01/30/2021   BUN 14 01/30/2021   CREATININE 1.05 01/30/2021   BILITOT 0.6 01/30/2021   ALKPHOS 70 01/30/2021   AST 15 01/30/2021   ALT 9 01/30/2021   PROT 7.3 01/30/2021   ALBUMIN 4.8 01/30/2021   CALCIUM 10.1 01/30/2021   GFR 90.09 01/30/2021   Lab Results  Component Value Date   CHOL 203 (H) 01/30/2021   Lab Results  Component Value Date   HDL 57.90 01/30/2021   Lab Results  Component Value Date   LDLCALC 119 (H) 01/30/2021   Lab Results  Component Value Date   TRIG 131.0 01/30/2021   Lab Results  Component Value Date   CHOLHDL 4 01/30/2021   Lab Results  Component Value Date   HGBA1C 5.2 01/30/2021       Assessment & Plan:   Problem List Items Addressed This Visit     Anxiety    He has been having trouble getting his Xanax filled at the pharmacy. He has  been consistent with his meds and following protocol for years. He was given 30 day supply with 3 rf on June 1 so last refill should be fillable on August 29. Patient assures us he is doing well and has had no recent crisis or concerns and is only taking 1 tab per day. He locks up his medications as well when he has people around. Will continue meds at same strength and frequency       I am having Jesse KielMatthew A. Delossantos "Matt" maintain his multivitamin, vitamin B-12, cholecalciferol, fish oil-omega-3 fatty acids, vitamin C, clindamycin-benzoyl peroxide, furosemide, methocarbamol, ALPRAZolam, fluticasone, citalopram, ALPRAZolam, OLANZapine, OLANZapine, amphetamine-dextroamphetamine,  amphetamine-dextroamphetamine, and amphetamine-dextroamphetamine.  No orders of the defined types were placed in this encounter.   I discussed the assessment and treatment plan with the patient. The patient was provided an opportunity to ask questions and all were answered. The patient agreed with the plan and demonstrated an understanding of the instructions.   The patient was advised to call back or seek an in-person evaluation if the symptoms worsen or if the condition fails to improve as anticipated.  I provided 13 minutes of face-to-face time during this encounter.   Danise EdgeStacey Adonte Vanriper, MD [facility] [phone] (phone) [phone] (fax)  [facility]

## 2021-06-21 NOTE — Assessment & Plan Note (Signed)
He has been having trouble getting his Xanax filled at the pharmacy. He has been consistent with his meds and following protocol for years. He was given 30 day supply with 3 rf on June 1 so last refill should be fillable on August 29. Patient assures [place] he is doing well and has had no recent crisis or concerns and is only taking 1 tab per day. He locks up his medications as well when he has people around. Will continue meds at same strength and frequency

## 2021-06-22 ENCOUNTER — Other Ambulatory Visit (HOSPITAL_BASED_OUTPATIENT_CLINIC_OR_DEPARTMENT_OTHER): Payer: Self-pay

## 2021-06-23 ENCOUNTER — Other Ambulatory Visit (HOSPITAL_BASED_OUTPATIENT_CLINIC_OR_DEPARTMENT_OTHER): Payer: Self-pay

## 2021-06-23 ENCOUNTER — Telehealth: Payer: Self-pay | Admitting: Family Medicine

## 2021-06-23 NOTE — Telephone Encounter (Signed)
Medication sent in already.

## 2021-06-23 NOTE — Telephone Encounter (Signed)
Medication: amphetamine-dextroamphetamine (ADDERALL XR) 20 MG 24 hr capsule [[id number]]     Has the patient contacted their pharmacy? No. (If no, request that the patient contact the pharmacy for the refill.) (If yes, when and what did the pharmacy advise?)    [facility] [address]    Agent: Please be advised that RX refills may take up to 3 business days. We ask that you follow-up with your pharmacy.

## 2021-06-26 ENCOUNTER — Other Ambulatory Visit (HOSPITAL_BASED_OUTPATIENT_CLINIC_OR_DEPARTMENT_OTHER): Payer: Self-pay

## 2021-06-26 ENCOUNTER — Other Ambulatory Visit: Payer: Self-pay | Admitting: Family Medicine

## 2021-06-26 MED ORDER — ALPRAZOLAM ER 3 MG PO TB24
3.0000 mg | ORAL_TABLET | Freq: Every morning | ORAL | 3 refills | Status: DC
  Filled 2021-06-26: qty 3, 3d supply, fill #0

## 2021-06-27 ENCOUNTER — Other Ambulatory Visit: Payer: Self-pay

## 2021-06-27 MED ORDER — AMPHETAMINE-DEXTROAMPHET ER 20 MG PO CP24: 20.0000 mg | ORAL_CAPSULE | Freq: Three times a day (TID) | ORAL | 0 refills | Status: DC

## 2021-06-27 NOTE — Telephone Encounter (Signed)
[facility]. I did see where rxs were sent in however the pharmacy did not get the three different rxs. I have pended refill. Please sign if appropriate.

## 2021-07-18 ENCOUNTER — Telehealth (INDEPENDENT_AMBULATORY_CARE_PROVIDER_SITE_OTHER): Payer: Medicare Other | Admitting: Family Medicine

## 2021-07-18 ENCOUNTER — Other Ambulatory Visit: Payer: Self-pay

## 2021-07-18 ENCOUNTER — Other Ambulatory Visit (HOSPITAL_BASED_OUTPATIENT_CLINIC_OR_DEPARTMENT_OTHER): Payer: Self-pay

## 2021-07-18 DIAGNOSIS — L600 Ingrowing nail: Secondary | ICD-10-CM | POA: Diagnosis not present

## 2021-07-18 DIAGNOSIS — F419 Anxiety disorder, unspecified: Secondary | ICD-10-CM

## 2021-07-18 DIAGNOSIS — R739 Hyperglycemia, unspecified: Secondary | ICD-10-CM | POA: Diagnosis not present

## 2021-07-18 MED ORDER — SULFAMETHOXAZOLE-TRIMETHOPRIM 800-160 MG PO TABS: 1.0000 | ORAL_TABLET | Freq: Two times a day (BID) | ORAL | 0 refills | Status: DC

## 2021-07-18 MED ORDER — ALPRAZOLAM ER 3 MG PO TB24
3.0000 mg | ORAL_TABLET | Freq: Every morning | ORAL | 3 refills | Status: DC
  Filled 2021-07-18 – 2021-07-23 (×2): qty 30, 30d supply, fill #0
  Filled 2021-08-23: qty 30, 30d supply, fill #1
  Filled 2021-09-22: qty 30, 30d supply, fill #2
  Filled 2021-10-20: qty 30, 30d supply, fill #3

## 2021-07-18 NOTE — Progress Notes (Signed)
MyChart Video Visit    Virtual Visit via Video Note   This visit type was conducted due to national recommendations for restrictions regarding the COVID-19 Pandemic (e.g. social distancing) in an effort to limit this patient's exposure and mitigate transmission in our community. This patient is at least at moderate risk for complications without adequate follow up. This format is felt to be most appropriate for this patient at this time. Physical exam was limited by quality of the video and audio technology used for the visit. S. Chism, CMA was able to get the patient set up on a video visit.  Patient location: Home Patient and provider in visit Provider location: Office  I discussed the limitations of evaluation and management by telemedicine and the availability of in person appointments. The patient expressed understanding and agreed to proceed.  Visit Date: 07/19/21  Today's healthcare provider: Danise Edge, MD     Subjective:    Patient ID: Jesse Jones, male    DOB: July 10, 1981, 40 y.o.   MRN: [id number]  No chief complaint on file.   HPI Patient is in today for virtual visit for follow up on recent ingrown toenail in right foot. He is doing well and has no febrile illnesses or recent ER visits to report. He states that his ingrown toenail is on the medial aspect of his right foot. Denies CP/palp/SOB/HA/congestion/fevers/GI or GU c/o. 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   Depression    ED (erectile dysfunction) 05/12/2012   Hyperlipidemia, mild 04/18/2015   Insomnia    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   Outbursts of anger    Preventative health care 10/03/2011   Tobacco abuse 04/28/2011     Past Surgical History:  Procedure Laterality Date   NASAL SEPTUM SURGERY Bilateral    Dr Jenne Pane 2017   punctured lung     toe surgeries  during childhood    for ingrown toenails    Family History  Problem Relation Age of Onset   Depression Mother    Anxiety disorder Mother    Depression Brother    Diabetes Brother        type 1   Cancer Paternal Grandmother        lung/ didn't smoke   Cancer Maternal Uncle 33       colon cancer    Social History   Socioeconomic History   Marital status: Single    Spouse name: Not on file   Number of children: Not on file   Years of education: Not on file   Highest education level: Not on file  Occupational History   Not on file  Tobacco Use   Smoking status: Former    Packs/day: 0.30    Years: 20.00    Pack years: 6.00    Types: Cigarettes   Smokeless tobacco: Never  Vaping Use   Vaping Use: Never used  Substance and Sexual Activity   Alcohol use: Yes    Alcohol/week: 36.0 standard drinks    Types: 36 Cans of beer per week    Comment: sober x77yr. No alcohol.   Drug use: Yes    Types: Marijuana, Other-see comments  Comment: pt stopped 4 year ago   Sexual activity: Yes    Partners: Female    Comment: lives alone and eating well. exercising  Other Topics Concern   Not on file  Social History Narrative   Not on file   Social Determinants of Health   Financial Resource Strain: Not on file  Food Insecurity: Not on file  Transportation Needs: Not on file  Physical Activity: Not on file  Stress: Not on file  Social Connections: Not on file  Intimate Partner Violence: Not on file    Outpatient Medications Prior to Visit  Medication Sig Dispense Refill   amphetamine-dextroamphetamine (ADDERALL XR) 20 MG 24 hr capsule Take 1 capsule (20 mg total) by mouth in the morning, at noon, and at bedtime. September 2022 90 capsule 0   amphetamine-dextroamphetamine (ADDERALL XR) 20 MG 24 hr capsule Take 1 capsule (20 mg total)  by mouth in the morning, at noon, and at bedtime. August 2022 90 capsule 0   amphetamine-dextroamphetamine (ADDERALL XR) 20 MG 24 hr capsule Take 1 capsule (20 mg total) by mouth in the morning, at noon, and at bedtime. 90 capsule 0   cholecalciferol (VITAMIN D) 1000 UNITS tablet Take 1,000 Units by mouth every morning.     citalopram (CELEXA) 20 MG tablet TAKE 1 TABLET BY MOUTH TWICE A DAY 180 tablet 1   clindamycin-benzoyl peroxide (BENZACLIN) gel Apply topically 2 (two) times daily. 25 g 2   fish oil-omega-3 fatty acids 1000 MG capsule Take 2 g by mouth daily.     fluticasone (FLONASE) 50 MCG/ACT nasal spray Place 2 sprays into both nostrils daily as needed for allergies or rhinitis. 16 g 6   furosemide (LASIX) 20 MG tablet TAKE 1 TABLET BY MOUTH EVERY DAY AS NEEDED 90 tablet 1   methocarbamol (ROBAXIN) 500 MG tablet Take 1 tablet (500 mg total) by mouth 2 (two) times daily. 20 tablet 0   Multiple Vitamin (MULTIVITAMIN) tablet Take 1 tablet by mouth every morning.     OLANZapine (ZYPREXA) 5 MG tablet TAKE 1 TABLET BY MOUTH EVERYDAY AT BEDTIME 30 tablet 3   vitamin B-12 (CYANOCOBALAMIN) 1000 MCG tablet Take 1,000 mcg by mouth every morning.     vitamin C (ASCORBIC ACID) 500 MG tablet Take 500 mg by mouth daily.     ALPRAZolam (XANAX XR) 3 MG 24 hr tablet Take 1 tablet (3 mg total) by mouth every morning. 3 tablet 3   OLANZapine (ZYPREXA) 2.5 MG tablet TAKE 1 TABLET BY MOUTH AT BEDTIME. 90 tablet 1   No facility-administered medications prior to visit.    Allergies  Allergen Reactions   Haloperidol Decanoate    Hydroxyzine Anxiety    EXCESSIVE ANXIOUNESS   Other Other (See Comments)    Reverse effect, super hyper   Penicillins Hives   Valium     Review of Systems  Constitutional:  Negative for chills, fever and malaise/fatigue.  HENT:  Negative for congestion, sinus pain and sore throat.   Eyes:  Negative for blurred vision.  Respiratory:  Negative for cough and shortness of  breath.   Cardiovascular:  Negative for chest pain, palpitations and leg swelling.  Gastrointestinal:  Negative for blood in stool, diarrhea, nausea and vomiting.  Genitourinary:  Negative for flank pain and frequency.  Musculoskeletal:  Negative for back pain.  Skin:  Negative for rash.  Neurological:  Negative for headaches.      Objective:    Physical Exam Constitutional:  Appearance: Normal appearance.  HENT:     Head: Normocephalic and atraumatic.     Right Ear: External ear normal.     Left Ear: External ear normal.  Pulmonary:     Effort: Pulmonary effort is normal.  Musculoskeletal:     Cervical back: No rigidity.  Feet:     Right foot:     Toenail Condition: Right toenails are ingrown.     Comments: -ingrown right toenail on medial aspect. Neurological:     Mental Status: He is alert and oriented to person, place, and time.  Psychiatric:        Behavior: Behavior normal.    Pulse (!) 109   Wt 200 lb (90.7 kg)   BMI 25.68 kg/m  Wt Readings from Last 3 Encounters:  07/18/21 200 lb (90.7 kg)  01/30/21 194 lb 12.8 oz (88.4 kg)  12/01/20 196 lb (88.9 kg)    Diabetic Foot Exam - Simple   No data filed    Lab Results  Component Value Date   WBC 8.7 01/30/2021   HGB 15.6 01/30/2021   HCT 45.5 01/30/2021   PLT 281.0 01/30/2021   GLUCOSE 73 01/30/2021   CHOL 203 (H) 01/30/2021   TRIG 131.0 01/30/2021   HDL 57.90 01/30/2021   LDLCALC 119 (H) 01/30/2021   ALT 9 01/30/2021   AST 15 01/30/2021   NA 141 01/30/2021   K 4.7 01/30/2021   CL 102 01/30/2021   CREATININE 1.05 01/30/2021   BUN 14 01/30/2021   CO2 31 01/30/2021   TSH 1.01 01/30/2021   HGBA1C 5.2 01/30/2021    Lab Results  Component Value Date   TSH 1.01 01/30/2021   Lab Results  Component Value Date   WBC 8.7 01/30/2021   HGB 15.6 01/30/2021   HCT 45.5 01/30/2021   MCV 84.5 01/30/2021   PLT 281.0 01/30/2021   Lab Results  Component Value Date   NA 141 01/30/2021   K 4.7  01/30/2021   CO2 31 01/30/2021   GLUCOSE 73 01/30/2021   BUN 14 01/30/2021   CREATININE 1.05 01/30/2021   BILITOT 0.6 01/30/2021   ALKPHOS 70 01/30/2021   AST 15 01/30/2021   ALT 9 01/30/2021   PROT 7.3 01/30/2021   ALBUMIN 4.8 01/30/2021   CALCIUM 10.1 01/30/2021   GFR 90.09 01/30/2021   Lab Results  Component Value Date   CHOL 203 (H) 01/30/2021   Lab Results  Component Value Date   HDL 57.90 01/30/2021   Lab Results  Component Value Date   LDLCALC 119 (H) 01/30/2021   Lab Results  Component Value Date   TRIG 131.0 01/30/2021   Lab Results  Component Value Date   CHOLHDL 4 01/30/2021   Lab Results  Component Value Date   HGBA1C 5.2 01/30/2021       Assessment & Plan:   Problem List Items Addressed This Visit     Anxiety    Refill given on Alprazolam XR 3 mg daily      Relevant Medications   ALPRAZolam (XANAX XR) 3 MG 24 hr tablet   Hyperglycemia    hgba1c acceptable, minimize simple carbs. Increase exercise as tolerated.       Ingrown toenail of right foot    Started on Bactrim DS to take and he should soak toe in hydrogen peroxide and hot water daily and apply cotton and neosporin will have to see podiatry if does not resolve.         Meds  ordered this encounter  Medications   sulfamethoxazole-trimethoprim (BACTRIM DS) 800-160 MG tablet    Sig: Take 1 tablet by mouth 2 (two) times daily.    Dispense:  20 tablet    Refill:  0   ALPRAZolam (XANAX XR) 3 MG 24 hr tablet    Sig: Take 1 tablet (3 mg total) by mouth every morning.    Dispense:  30 tablet    Refill:  3    I discussed the assessment and treatment plan with the patient. The patient was provided an opportunity to ask questions and all were answered. The patient agreed with the plan and demonstrated an understanding of the instructions.   The patient was advised to call back or seek an in-person evaluation if the symptoms worsen or if the condition fails to improve as  anticipated.  I provided 15 minutes of face-to-face time during this encounter.   Danise Edge, MD [facility] [phone] (phone) [phone] (fax)  [facility]   I, Billie Lade, acting as a scribe for Danise Edge, MD, have documented all relevent documentation on behalf of Danise Edge, MD, as directed by Danise Edge, MD while in the presence of Danise Edge, MD. DO:07/19/21.  I, Bradd Canary, MD personally performed the services described in this documentation. All medical record entries made by the scribe were at my direction and in my presence. I have reviewed the chart and agree that the record reflects my personal performance and is accurate and complete

## 2021-07-19 DIAGNOSIS — L600 Ingrowing nail: Secondary | ICD-10-CM | POA: Insufficient documentation

## 2021-07-19 NOTE — Assessment & Plan Note (Signed)
Refill given on Alprazolam XR 3 mg daily

## 2021-07-19 NOTE — Assessment & Plan Note (Signed)
hgba1c acceptable, minimize simple carbs. Increase exercise as tolerated.  

## 2021-07-19 NOTE — Assessment & Plan Note (Signed)
Started on Bactrim DS to take and he should soak toe in hydrogen peroxide and hot water daily and apply cotton and neosporin will have to see podiatry if does not resolve.

## 2021-07-24 ENCOUNTER — Other Ambulatory Visit (HOSPITAL_BASED_OUTPATIENT_CLINIC_OR_DEPARTMENT_OTHER): Payer: Self-pay

## 2021-07-25 ENCOUNTER — Other Ambulatory Visit: Payer: Self-pay | Admitting: Family Medicine

## 2021-07-25 MED ORDER — AMPHETAMINE-DEXTROAMPHET ER 20 MG PO CP24: 20.0000 mg | ORAL_CAPSULE | Freq: Three times a day (TID) | ORAL | 0 refills | Status: DC

## 2021-07-25 NOTE — Telephone Encounter (Signed)
Medication: amphetamine-dextroamphetamine (ADDERALL XR) 20 MG 24 hr capsule  Has the patient contacted their pharmacy? No. (If no, request that the patient contact the pharmacy for the refill.) (If yes, when and what did the pharmacy advise?)  Preferred Pharmacy (with phone number or street name): [id number] [place], [place] - [address] AT [facility] [address]  [address] [place] [place] [place] [address]  Phone:  [phone]    Agent: Please be advised that RX refills may take up to 3 business days. We ask that you follow-up with your pharmacy.

## 2021-07-25 NOTE — Telephone Encounter (Signed)
Requesting: Adderall XR 20mg  Contract: 03/27/2018 UDS: 06/23/2020 Last Visit: 07/18/2021 Next Visit: 11/27/2021 Last Refill: 06/27/2021 #90 and 0RF   Please Advise

## 2021-08-02 ENCOUNTER — Encounter: Payer: Self-pay | Admitting: Family Medicine

## 2021-08-02 DIAGNOSIS — L600 Ingrowing nail: Secondary | ICD-10-CM

## 2021-08-14 ENCOUNTER — Other Ambulatory Visit: Payer: Self-pay | Admitting: Family Medicine

## 2021-08-16 ENCOUNTER — Ambulatory Visit: Payer: Medicare Other | Admitting: Podiatry

## 2021-08-18 ENCOUNTER — Other Ambulatory Visit (HOSPITAL_BASED_OUTPATIENT_CLINIC_OR_DEPARTMENT_OTHER): Payer: Self-pay

## 2021-08-18 ENCOUNTER — Telehealth: Payer: Self-pay | Admitting: Family Medicine

## 2021-08-23 ENCOUNTER — Other Ambulatory Visit (HOSPITAL_BASED_OUTPATIENT_CLINIC_OR_DEPARTMENT_OTHER): Payer: Self-pay

## 2021-08-25 ENCOUNTER — Other Ambulatory Visit: Payer: Self-pay | Admitting: Family Medicine

## 2021-08-25 ENCOUNTER — Telehealth: Payer: Self-pay | Admitting: Family Medicine

## 2021-08-25 NOTE — Telephone Encounter (Signed)
Medication:  amphetamine-dextroamphetamine (ADDERALL XR) 20 MG 24 hr capsule  Has the patient contacted their pharmacy? No. (If no, request that the patient contact the pharmacy for the refill.) (If yes, when and what did the pharmacy advise?)  Preferred Pharmacy (with phone number or street name): [id number] - [place], [place] - [address] AT [address]  [address] [place] [address]  Phone:  [phone]  Fax:  [phone]   Agent: Please be advised that RX refills may take up to 3 business days. We ask that you follow-up with your pharmacy.  

## 2021-08-25 NOTE — Telephone Encounter (Signed)
Requesting: adderall xr 20mg  Contract:  UDS: 06/23/20 Last Visit: 07/18/21 Next Visit: 11/27/21 Last Refill: 07/25/21  Please Advise

## 2021-08-28 ENCOUNTER — Other Ambulatory Visit: Payer: Self-pay | Admitting: Family Medicine

## 2021-08-28 MED ORDER — AMPHETAMINE-DEXTROAMPHET ER 20 MG PO CP24: 20.0000 mg | ORAL_CAPSULE | Freq: Three times a day (TID) | ORAL | 0 refills | Status: DC

## 2021-08-29 NOTE — Telephone Encounter (Signed)
Patient notified that rx was sent over and he stated that they did receive it yesterday.

## 2021-08-30 ENCOUNTER — Telehealth: Payer: Self-pay | Admitting: Family Medicine

## 2021-08-30 ENCOUNTER — Other Ambulatory Visit: Payer: Self-pay | Admitting: Family Medicine

## 2021-08-30 MED ORDER — AMPHETAMINE-DEXTROAMPHET ER 20 MG PO CP24: 20.0000 mg | ORAL_CAPSULE | Freq: Three times a day (TID) | ORAL | 0 refills | Status: DC

## 2021-08-30 NOTE — Telephone Encounter (Signed)
Patient notified

## 2021-08-30 NOTE — Telephone Encounter (Signed)
Patient's mom stated she found the medication at: [facility] [place] location Phone:[phone] [facility]

## 2021-08-30 NOTE — Telephone Encounter (Signed)
Brand name adderall XR 20mg : Mom was able to find medication at the pharmacy below due to other pharmacies being out of generic. Pts mom would like a prescription of the brand name sent to the pharmacy below:  [facility] [address], [place] [phone]

## 2021-09-13 NOTE — Telephone Encounter (Signed)
Error

## 2021-09-20 ENCOUNTER — Other Ambulatory Visit (HOSPITAL_BASED_OUTPATIENT_CLINIC_OR_DEPARTMENT_OTHER): Payer: Self-pay

## 2021-09-22 ENCOUNTER — Other Ambulatory Visit (HOSPITAL_BASED_OUTPATIENT_CLINIC_OR_DEPARTMENT_OTHER): Payer: Self-pay

## 2021-09-27 ENCOUNTER — Telehealth: Payer: Self-pay | Admitting: Family Medicine

## 2021-09-27 ENCOUNTER — Other Ambulatory Visit: Payer: Self-pay | Admitting: Family Medicine

## 2021-09-27 MED ORDER — AMPHETAMINE-DEXTROAMPHET ER 20 MG PO CP24: 20.0000 mg | ORAL_CAPSULE | Freq: Three times a day (TID) | ORAL | 0 refills | Status: DC

## 2021-09-27 NOTE — Telephone Encounter (Signed)
Medication:  amphetamine-dextroamphetamine (ADDERALL XR) 20 MG 24 hr capsule  Has the patient contacted their pharmacy? No. (If no, request that the patient contact the pharmacy for the refill.) (If yes, when and what did the pharmacy advise?)  Preferred Pharmacy (with phone number or street name): [id number] - [place], [place] - [address] AT [facility] [facility] [facility] [address]  [address] [place] [address]  Phone:  [phone]  Fax:  [phone]   Agent: Please be advised that RX refills may take up to 3 business days. We ask that you follow-up with your pharmacy.

## 2021-09-28 DIAGNOSIS — Z23 Encounter for immunization: Secondary | ICD-10-CM | POA: Diagnosis not present

## 2021-10-06 ENCOUNTER — Other Ambulatory Visit: Payer: Self-pay | Admitting: Family Medicine

## 2021-10-14 ENCOUNTER — Encounter (HOSPITAL_COMMUNITY): Payer: Self-pay | Admitting: Emergency Medicine

## 2021-10-14 ENCOUNTER — Other Ambulatory Visit: Payer: Self-pay

## 2021-10-14 ENCOUNTER — Ambulatory Visit (HOSPITAL_COMMUNITY)
Admission: EM | Admit: 2021-10-14 | Discharge: 2021-10-14 | Disposition: A | Discharge status: Home / Self Care | Payer: Medicare Other | Attending: Physician Assistant | Admitting: Physician Assistant

## 2021-10-14 DIAGNOSIS — S39011A Strain of muscle, fascia and tendon of abdomen, initial encounter: Secondary | ICD-10-CM | POA: Diagnosis not present

## 2021-10-14 LAB — POCT URINALYSIS DIPSTICK, ED / UC
Bilirubin Urine: NEGATIVE
Glucose, UA: NEGATIVE mg/dL
Hgb urine dipstick: NEGATIVE
Ketones, ur: NEGATIVE mg/dL
Leukocytes,Ua: NEGATIVE
Nitrite: NEGATIVE
Protein, ur: NEGATIVE mg/dL
Specific Gravity, Urine: 1.01 (ref 1.005–1.030)
Urobilinogen, UA: 0.2 mg/dL (ref 0.0–1.0)
pH: 7 (ref 5.0–8.0)

## 2021-10-14 MED ORDER — TIZANIDINE HCL 4 MG PO CAPS: 4.0000 mg | ORAL_CAPSULE | Freq: Two times a day (BID) | ORAL | 0 refills | Status: DC | PRN

## 2021-10-14 NOTE — Discharge Instructions (Signed)
Your urine was normal.  I believe that you have a muscle strain from your high intensity workout.  Please continue your ibuprofen and Tylenol over-the-counter.  I have called in tizanidine that you can take twice daily.  This will make you sleepy so do not drive or drink alcohol while taking it.  Use heat, rest, stretch for additional symptom relief.  I would recommend that you limit your high intensity workout until symptoms improve.  If things or not improving follow-up with your primary care provider.  If you develop any severe symptoms including fever, abdominal pain, nausea, vomiting, urinary changes, increased intensity of pain you need to go to the emergency room.

## 2021-10-14 NOTE — ED Triage Notes (Signed)
Patient c/o RT Flank Pain x 2 days.   Patient c/o LFT Flank Pain x 2 weeks.   Patient denies ABD pain, fever, and Dysuria.   Patient denies N/V/D.   Patient has taken Advil with some relief of symptoms.   Patient denies any history of renal issues.

## 2021-10-14 NOTE — ED Provider Notes (Signed)
[place]    CSN: [id number] Arrival date & time: 10/14/21  1028      History   Chief Complaint Chief Complaint  Patient presents with   Flank Pain    HPI ROMONDO YORK is a 40 y.o. male.   Patient presents today with a several week history of left-sided flank pain that has spread to involve his right flank for the past few days.  He reports pain is rated 2 on a 0-10 pain scale, localized to bilateral flanks, described as aching, worse with certain movements, no alleviating factors identified.  He has tried ibuprofen with temporary improvement of symptoms.  He denies any known injury prior to symptom onset.  He does report that approximately 1 week before symptoms began he started exercising using a very intense workout and wonders if this could be contributing to symptoms.  He denies any urinary symptoms including frequency, urgency, dysuria, hematuria.  Denies history of nephrolithiasis.  He denies any fever, nausea, vomiting, headache, dizziness, chest pain, shortness of breath.   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   Depression    ED (erectile dysfunction) 05/12/2012   Hyperlipidemia, mild 04/18/2015   Insomnia    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   Outbursts of anger    Preventative health care 10/03/2011   Tobacco abuse 04/28/2011    Patient Active Problem List   Diagnosis Date Noted   Ingrown toenail of right foot 07/19/2021   LOM (left otitis media) 01/30/2021   Hyperglycemia 06/28/2020   Head injury due to trauma 03/02/2020   Pedal edema 08/26/2019   Impetigo 02/03/2019   Constipation 12/07/2018   Nasal turbinate hypertrophy 07/27/2016   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   Hyperlipidemia, mild  04/18/2015   Allergic state 02/17/2014   Alcohol abuse, 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   Bipolar disorder, mixed ([place]) 02/05/2013   Tachycardia 07/07/2012   ED (erectile dysfunction) 05/12/2012   Preventative health care 10/03/2011   Nicotine dependence 04/28/2011   ADHD (attention deficit hyperactivity disorder) 04/02/2011   Anxiety 04/02/2011   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 Authorizing Provider  ALPRAZolam (XANAX XR) 3 MG 24 hr tablet Take 1 tablet (3 mg total) by mouth every morning. 07/18/21  Yes Mosie Lukes, MD  amphetamine-dextroamphetamine (ADDERALL XR) 20 MG 24 hr capsule Take 1 capsule (20 mg total) by mouth in the morning, at noon, and at bedtime. 09/27/21  Yes Mosie Lukes, MD  citalopram (CELEXA) 20 MG tablet TAKE 1 TABLET BY MOUTH TWICE A DAY 02/12/21  Yes Mosie Lukes, MD  OLANZapine (ZYPREXA) 5 MG tablet TAKE 1 TABLET BY MOUTH EVERYDAY AT BEDTIME 10/06/21  Yes Mosie Lukes, MD  tiZANidine (ZANAFLEX) 4 MG capsule Take 1 capsule (4 mg total) by mouth 2 (two) times daily as needed for muscle spasms. 10/14/21  Yes Samanvitha Germany,  Derry Skill, PA-C  cholecalciferol (VITAMIN D) 1000 UNITS tablet Take 1,000 Units by mouth every morning.    [provider]  clindamycin-benzoyl peroxide (BENZACLIN) gel Apply topically 2 (two) times daily. 02/23/16   Mosie Lukes, MD  fish oil-omega-3 fatty acids 1000 MG capsule Take 2 g by mouth daily.    [provider]  fluticasone (FLONASE) 50 MCG/ACT nasal spray PLACE 2 SPRAYS INTO BOTH NOSTRILS DAILY AS NEEDED FOR ALLERGIES OR RHINITIS. 08/14/21   Mosie Lukes, MD  furosemide (LASIX) 20 MG tablet TAKE 1 TABLET BY MOUTH EVERY DAY AS  NEEDED 11/09/19   Mosie Lukes, MD  methocarbamol (ROBAXIN) 500 MG tablet Take 1 tablet (500 mg total) by mouth 2 (two) times daily. 02/28/20   Volanda Napoleon, PA-C  Multiple Vitamin (MULTIVITAMIN) tablet Take 1 tablet by mouth every morning.    [provider]  OLANZapine (ZYPREXA) 2.5 MG tablet TAKE 1 TABLET BY MOUTH AT BEDTIME. 04/13/21   Mosie Lukes, MD  vitamin B-12 (CYANOCOBALAMIN) 1000 MCG tablet Take 1,000 mcg by mouth every morning.    [provider]  vitamin C (ASCORBIC ACID) 500 MG tablet Take 500 mg by mouth daily.    [provider]    Family History Family History  Problem Relation Age of Onset   Depression Mother    Anxiety disorder Mother    Depression Brother    Diabetes Brother        type 1   Cancer Paternal Grandmother        lung/ didn't smoke   Cancer Maternal Uncle 41       colon cancer    Social History Social History   Tobacco Use   Smoking status: Former    Packs/day: 0.30    Years: 20.00    Pack years: 6.00    Types: Cigarettes   Smokeless tobacco: Never  Vaping Use   Vaping Use: Never used  Substance Use Topics   Alcohol use: Yes    Alcohol/week: 36.0 standard drinks    Types: 36 Cans of beer per week    Comment: sober x68yr. No alcohol.   Drug use: Yes    Types: Marijuana, Other-see comments    Comment: pt stopped 4 year ago     Allergies   Haloperidol decanoate, Hydroxyzine, Other, Penicillins, and Valium   Review of Systems Review of Systems  Constitutional:  Positive for activity change. Negative for appetite change, fatigue and fever.  Respiratory:  Negative for cough and shortness of breath.   Cardiovascular:  Negative for chest pain.  Gastrointestinal:  Negative for abdominal pain, diarrhea, nausea and vomiting.  Genitourinary:  Positive for flank pain. Negative for dysuria, frequency, hematuria, penile discharge, penile pain and urgency.  Musculoskeletal:  Positive for back pain. Negative for  arthralgias and myalgias.  Neurological:  Negative for dizziness, weakness, light-headedness, numbness and headaches.    Physical Exam Triage Vital Signs ED Triage Vitals  Enc Vitals Group     BP 10/14/21 1137 130/87     Pulse Rate 10/14/21 1137 65     Resp 10/14/21 1137 16     Temp 10/14/21 1137 99.2 F (37.3 C)     Temp Source 10/14/21 1137 Oral     SpO2 10/14/21 1137 96 %     Weight --      Height --      Head Circumference --      Peak Flow --  Pain Score 10/14/21 1138 3     Pain Loc --      Pain Edu? --      Excl. in GC? --    No data found.  Updated Vital Signs BP 130/87 (BP Location: Left Arm)    Pulse 65    Temp 99.2 F (37.3 C) (Oral)    Resp 16    SpO2 96%   Visual Acuity Right Eye Distance:   Left Eye Distance:   Bilateral Distance:    Right Eye Near:   Left Eye Near:    Bilateral Near:     Physical Exam Vitals reviewed.  Constitutional:      General: He is awake.     Appearance: Normal appearance. He is well-developed. He is not ill-appearing.     Comments: Very pleasant male appears stated age in no acute distress sitting comfortably in exam room  HENT:     Head: Normocephalic and atraumatic.  Cardiovascular:     Rate and Rhythm: Normal rate and regular rhythm.     Heart sounds: Normal heart sounds, S1 normal and S2 normal. No murmur heard. Pulmonary:     Effort: Pulmonary effort is normal.     Breath sounds: Normal breath sounds. No stridor. No wheezing, rhonchi or rales.     Comments: Clear to auscultation bilaterally Abdominal:     General: Bowel sounds are normal.     Palpations: Abdomen is soft.     Tenderness: There is no abdominal tenderness. There is no right CVA tenderness, left CVA tenderness, guarding or rebound.     Comments: Benign abdominal exam; no CVA tenderness.  Musculoskeletal:     Cervical back: No tenderness or bony tenderness.     Thoracic back: No spasms, tenderness or bony tenderness.     Lumbar back: No tenderness  or bony tenderness.     Comments: No pain percussion of vertebrae.  No significant tenderness palpation on exam.  Neurological:     Mental Status: He is alert.  Psychiatric:        Behavior: Behavior is cooperative.     UC Treatments / Results  Labs (all labs ordered are listed, but only abnormal results are displayed) Labs Reviewed  POCT URINALYSIS DIPSTICK, ED / UC    EKG   Radiology No results found.  Procedures Procedures (including critical care time)  Medications Ordered in UC Medications - No data to display  Initial Impression / Assessment and Plan / UC Course  I have reviewed the triage vital signs and the nursing notes.  Pertinent labs & imaging results that were available during my care of the patient were reviewed by me and considered in my medical decision making (see chart for details).     Patient is afebrile and nontoxic-appearing in clinic today.  UA was normal.  Concern for muscle strain given association with increased activity level as well as improvement with ibuprofen and exacerbation with movement.  Patient was encouraged to continue over-the-counter analgesics and will add tizanidine for additional symptom relief.  Discussed that this is sedating and he should not drive or drink alcohol while taking it.  Recommended he decrease the intensity of workout and use conservative treatment measures including heat and gentle stretch for additional symptom relief.  Discussed that if symptoms persist we may consider imaging and encouraged him to follow-up with PCP if symptoms do not improve with treatment plan.  Discussed alarm symptoms that warrant emergent evaluation including severe pain, abdominal pain, fever,  urinary changes, nausea, vomiting.  Strict return precautions given to which she expressed understanding.  Final Clinical Impressions(s) / UC Diagnoses   Final diagnoses:  Strain of flank, initial encounter     Discharge Instructions      Your  urine was normal.  I believe that you have a muscle strain from your high intensity workout.  Please continue your ibuprofen and Tylenol over-the-counter.  I have called in tizanidine that you can take twice daily.  This will make you sleepy so do not drive or drink alcohol while taking it.  Use heat, rest, stretch for additional symptom relief.  I would recommend that you limit your high intensity workout until symptoms improve.  If things or not improving follow-up with your primary care provider.  If you develop any severe symptoms including fever, abdominal pain, nausea, vomiting, urinary changes, increased intensity of pain you need to go to the emergency room.     ED Prescriptions     Medication Sig Dispense Auth. Provider   tiZANidine (ZANAFLEX) 4 MG capsule Take 1 capsule (4 mg total) by mouth 2 (two) times daily as needed for muscle spasms. 14 capsule Josilynn Losh K, PA-C      PDMP not reviewed this encounter.   Jeani Hawking, PA-C 10/14/21 1212

## 2021-10-16 ENCOUNTER — Telehealth: Payer: Self-pay

## 2021-10-16 ENCOUNTER — Other Ambulatory Visit (HOSPITAL_BASED_OUTPATIENT_CLINIC_OR_DEPARTMENT_OTHER): Payer: Self-pay

## 2021-10-16 NOTE — Telephone Encounter (Signed)
Pt evaluated/treated in ER.   [facility] Night - ClientTELEPHONE ADVICE RECORDAccessNurse Patient Name:Jesse Jones Initial Comment Caller states he has sharp kidney pain that is on his left and right side. Translation No Nurse Assessment Nurse: Freida Busman, RN, Diane Date/Time Lamount Cohen Time): 10/14/2021 7:52:34 AM Confirm and document reason for call. If symptomatic, describe symptoms. ---Caller states he has sharp kidney pain that is on his left and right side. Started 3 weeks ago, but getting worse. No fever. No difficulty urinating. Pain level is a 2-3/10. Feels sore. Does the patient have any new or worsening symptoms? ---Yes Will a triage be completed? ---Yes Related visit to physician within the last 2 weeks? ---No Does the PT have any chronic conditions? (i.e. diabetes, asthma, this includes High risk factors for pregnancy, etc.) ---No Is this a behavioral health or substance abuse call? ---No Guidelines Guideline Title Affirmed Question Affirmed Notes Nurse Date/Time (Eastern Time) Flank Pain MODERATE pain (e.g., interferes with normal activities or awakens from sleep) Freida Busman, RN, Diane 10/14/2021 7:54:44 AM Disp. Time Lamount Cohen Time) Disposition Final User 10/14/2021 7:49:32 AM Send to Urgent Queue Darrel Hoover 10/14/2021 7:59:11 AM See PCP within 24 Hours Yes Freida Busman, RN, Diane PLEASE NOTE: All timestamps contained within this report are represented as [place] Standard Time. CONFIDENTIALTY NOTICE: This fax transmission is intended only for the addressee. It contains information that is legally privileged, confidential or otherwise protected from use or disclosure. If you are not the intended recipient, you are strictly prohibited from reviewing, disclosing, copying using or disseminating any of this information or taking any action in reliance on or regarding this information. If you have received this fax in error, please notify us immediately by  telephone so that we can arrange for its return to [place]. Phone: [phone], Toll-Free: [phone], Fax: [phone] Page: 2 of 2 Call Id: [id number] Caller Disagree/Comply Comply Caller Understands Yes PreDisposition Call Doctor Care Advice Given Per Guideline SEE PCP WITHIN 24 HOURS: * IF OFFICE WILL BE CLOSED: You need to be seen within the next 24 hours. A clinic or an urgent care center is often a good source of care if your doctor's office is closed or you can't get an appointment. PAIN MEDICINES: * ACETAMINOPHEN - REGULAR STRENGTH TYLENOL: Take 650 mg (two 325 mg pills) by mouth every 4 to 6 hours as needed. Each Regular Strength Tylenol pill has 325 mg of acetaminophen. The most you should take is 10 pills a day (3,250 mg total). Note: In [place], the maximum is 12 pills a day (3,900 mg total). * IBUPROFEN (E.G., MOTRIN, ADVIL): Take 400 mg (two 200 mg pills) by mouth every 6 hours. The most you should take is 6 pills a day (1,200 mg total). CALL BACK IF: * Fever over 100.4 F (38.0 C) CARE ADVICE given per Flank Pain (Adult) guideline. Referrals [facility] at [facility]

## 2021-10-19 ENCOUNTER — Other Ambulatory Visit (HOSPITAL_BASED_OUTPATIENT_CLINIC_OR_DEPARTMENT_OTHER): Payer: Self-pay

## 2021-10-20 ENCOUNTER — Other Ambulatory Visit (HOSPITAL_BASED_OUTPATIENT_CLINIC_OR_DEPARTMENT_OTHER): Payer: Self-pay

## 2021-10-25 ENCOUNTER — Telehealth: Payer: Self-pay | Admitting: Family Medicine

## 2021-10-25 NOTE — Telephone Encounter (Signed)
Medication:  amphetamine-dextroamphetamine (ADDERALL XR) 20 MG 24 hr capsule  Has the patient contacted their pharmacy? No. (If no, request that the patient contact the pharmacy for the refill.) (If yes, when and what did the pharmacy advise?)  Preferred Pharmacy (with phone number or street name): [id number] - [place], [place] - [address]  [address] [place] [address]  Phone:  [phone]  Fax:  [phone]   Agent: Please be advised that RX refills may take up to 3 business days. We ask that you follow-up with your pharmacy.  

## 2021-10-25 NOTE — Telephone Encounter (Signed)
pt would like meds sent to [facility] on [address], [place] [address]. please advise.

## 2021-10-26 ENCOUNTER — Telehealth: Payer: Self-pay | Admitting: Family Medicine

## 2021-10-26 ENCOUNTER — Other Ambulatory Visit: Payer: Self-pay | Admitting: Family Medicine

## 2021-10-26 MED ORDER — AMPHETAMINE-DEXTROAMPHET ER 20 MG PO CP24: 20.0000 mg | ORAL_CAPSULE | Freq: Three times a day (TID) | ORAL | 0 refills | Status: DC

## 2021-10-26 NOTE — Telephone Encounter (Signed)
[facility] in [facility], but [facility] does. He would like a new rx sent there.   Medication: amphetamine-dextroamphetamine (ADDERALL XR) 20 MG 24 hr capsule  Has the patient contacted their pharmacy? yes   Preferred Pharmacy:  [facility] [address], [place], [place] [address] [phone]

## 2021-11-05 ENCOUNTER — Other Ambulatory Visit: Payer: Self-pay | Admitting: Family Medicine

## 2021-11-15 ENCOUNTER — Telehealth: Payer: Self-pay | Admitting: Family Medicine

## 2021-11-15 MED ORDER — ALPRAZOLAM ER 3 MG PO TB24
3.0000 mg | ORAL_TABLET | Freq: Every morning | ORAL | 3 refills | Status: DC
Start: 2021-11-15 — End: 2021-11-17

## 2021-11-15 NOTE — Telephone Encounter (Signed)
Medication: ALPRAZolam (XANAX XR) 3 MG 24 hr tablet  Has the patient contacted their pharmacy? Yes.   (If no, request that the patient contact the pharmacy for the refill.) (If yes, when and what did the pharmacy advise?)  Preferred Pharmacy (with phone number or street name):  [facility]  [address], [address] [place] [address]  Phone:  [phone]  Fax:  [phone]   Agent: Please be advised that RX refills may take up to 3 business days. We ask that you follow-up with your pharmacy.

## 2021-11-15 NOTE — Telephone Encounter (Signed)
Requesting: Alprazolam 3mg  Contract: UDS:  Last Visit:   07/18/2021 Next Visit:  11/27/2021 Last Refill: 07/18/2021 #30 x 3RF  Please Advise

## 2021-11-16 ENCOUNTER — Other Ambulatory Visit (HOSPITAL_BASED_OUTPATIENT_CLINIC_OR_DEPARTMENT_OTHER): Payer: Self-pay

## 2021-11-16 ENCOUNTER — Other Ambulatory Visit: Payer: Self-pay | Admitting: Family Medicine

## 2021-11-17 ENCOUNTER — Other Ambulatory Visit: Payer: Self-pay | Admitting: Family Medicine

## 2021-11-17 ENCOUNTER — Other Ambulatory Visit (HOSPITAL_BASED_OUTPATIENT_CLINIC_OR_DEPARTMENT_OTHER): Payer: Self-pay

## 2021-11-17 ENCOUNTER — Telehealth: Payer: Self-pay | Admitting: *Deleted

## 2021-11-17 MED ORDER — ALPRAZOLAM ER 3 MG PO TB24
3.0000 mg | ORAL_TABLET | Freq: Every morning | ORAL | 3 refills | Status: DC
  Filled 2021-11-17: qty 30, 30d supply, fill #0
  Filled 2021-12-19: qty 30, 30d supply, fill #1

## 2021-11-17 NOTE — Telephone Encounter (Signed)
Patient states [facility]. He stated he needs it to be done by Monday. Please advice.     [facility]  [address], [place] [address]  Phone:  [phone]  Fax:  [phone]

## 2021-11-17 NOTE — Telephone Encounter (Signed)
Call Type Triage / Clinical Relationship To Patient Self Return Phone Number [phone] (Primary) Chief Complaint Prescription Refill or Medication Request (non symptomatic) Reason for Call Medication Question / Request Initial Comment Caller states they just need their Rx sent to the pharmacy. He called Monday for refills but they haven't been filled yet. Translation No Nurse Assessment Nurse: Files, RN, Dustin Date/Time (Eastern Time): 11/16/2021 5:22:11 PM Confirm and document reason for call. If symptomatic, describe symptoms. ---Caller states he called Monday for refills, he spoke to the pharmacy, pharmacy said the doctor has to sign over, caller was refusing to give information during the call states he was paranoid about cell phone calls, Does the patient have any new or worsening symptoms? ---No Please document clinical information provided and list any resource used. ---Told caller to call back in the AM to get his medication refilled Disp. Time Lamount Cohen Time) Disposition Final User 11/16/2021 5:24:56 PM Clinical Call Yes Files, RN, Sharene Butters

## 2021-11-17 NOTE — Telephone Encounter (Signed)
We [facility] stating alprazolam er 3mg  is not covered.  Alternatives are alprazolam 1mg , clonazepam 0.5mg , lorazepam 0.5mg .

## 2021-11-17 NOTE — Telephone Encounter (Signed)
See other message.  Patient wanted rx sent over to [facility].

## 2021-11-27 ENCOUNTER — Telehealth (INDEPENDENT_AMBULATORY_CARE_PROVIDER_SITE_OTHER): Payer: Medicare Other | Admitting: Family Medicine

## 2021-11-27 DIAGNOSIS — E785 Hyperlipidemia, unspecified: Secondary | ICD-10-CM

## 2021-11-27 DIAGNOSIS — F908 Attention-deficit hyperactivity disorder, other type: Secondary | ICD-10-CM

## 2021-11-27 DIAGNOSIS — R000 Tachycardia, unspecified: Secondary | ICD-10-CM | POA: Diagnosis not present

## 2021-11-27 DIAGNOSIS — F419 Anxiety disorder, unspecified: Secondary | ICD-10-CM

## 2021-11-27 DIAGNOSIS — R739 Hyperglycemia, unspecified: Secondary | ICD-10-CM | POA: Diagnosis not present

## 2021-11-27 DIAGNOSIS — K5900 Constipation, unspecified: Secondary | ICD-10-CM | POA: Diagnosis not present

## 2021-11-27 MED ORDER — METHYLPHENIDATE HCL 20 MG PO TABS: 20.0000 mg | ORAL_TABLET | Freq: Three times a day (TID) | ORAL | 0 refills | Status: DC

## 2021-11-27 NOTE — Assessment & Plan Note (Signed)
hgba1c acceptable, minimize simple carbs. Increase exercise as tolerated.  

## 2021-11-27 NOTE — Assessment & Plan Note (Addendum)
encouraged heart healthy diet, avoid trans fats, minimize simple carbs and saturated fats. Increase exercise as tolerated 

## 2021-11-27 NOTE — Progress Notes (Signed)
MyChart Video Visit    Virtual Visit via Video Note   This visit type was conducted due to national recommendations for restrictions regarding the COVID-19 Pandemic (e.g. social distancing) in an effort to limit this patient's exposure and mitigate transmission in our community. This patient is at least at moderate risk for complications without adequate follow up. This format is felt to be most appropriate for this patient at this time. Physical exam was limited by quality of the video and audio technology used for the visit. Lerry LinerShaakira C. was able to get the patient set up on a video visit.  Patient location: Home Patient and provider in visit Provider location: Office  I discussed the limitations of evaluation and management by telemedicine and the availability of in person appointments. The patient expressed understanding and agreed to proceed.  Visit Date: 11/27/2021  Today's healthcare provider: Danise EdgeStacey Darthy Manganelli, MD    Subjective:    Patient ID: Jesse KielMatthew A Jones, male    DOB: June 29, 1981, 41 y.o.   MRN: [id number]  Chief Complaint  Patient presents with   Follow-up   Manic Behavior    HPI Patient is in today for a video visit and 4 month f/u.  He reports most of the pharmacies have been out of 20 mg adderall and he is looking for a replacement. He is doing well at this time and has no complaints. No recent ED visits.  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   Depression    ED (erectile dysfunction) 05/12/2012   Hyperlipidemia, mild 04/18/2015   Insomnia    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   Outbursts of anger    Preventative health care 10/03/2011   Tobacco abuse 04/28/2011    Past Surgical History:  Procedure Laterality Date    NASAL SEPTUM SURGERY Bilateral    Dr Jenne PaneBates 2017   punctured lung     toe surgeries  during childhood    for ingrown toenails    Family History  Problem Relation Age of Onset   Depression Mother    Anxiety disorder Mother    Depression Brother    Diabetes Brother        type 1   Cancer Paternal Grandmother        lung/ didn't smoke   Cancer Maternal Uncle [id number]       colon cancer    Social History   Socioeconomic History   Marital status: Single    Spouse name: Not on file   Number of children: Not on file   Years of education: Not on file   Highest education level: Not on file  Occupational History   Not on file  Tobacco Use   Smoking status: Former    Packs/day: 0.30    Years: 20.00    Pack years: 6.00    Types: Cigarettes   Smokeless tobacco: Never  Vaping Use   Vaping Use: Never used  Substance and Sexual Activity   Alcohol use: Yes    Alcohol/week: 36.0 standard drinks    Types: 36 Cans of beer per week    Comment: sober x934yr. No alcohol.   Drug use: Yes    Types: Marijuana, Other-see comments    Comment:  pt stopped 4 year ago   Sexual activity: Yes    Partners: Female    Comment: lives alone and eating well. exercising  Other Topics Concern   Not on file  Social History Narrative   Not on file   Social Determinants of Health   Financial Resource Strain: Not on file  Food Insecurity: Not on file  Transportation Needs: Not on file  Physical Activity: Not on file  Stress: Not on file  Social Connections: Not on file  Intimate Partner Violence: Not on file    Outpatient Medications Prior to Visit  Medication Sig Dispense Refill   ALPRAZolam (XANAX XR) 3 MG 24 hr tablet Take 1 tablet (3 mg total) by mouth every morning. 30 tablet 3   cholecalciferol (VITAMIN D) 1000 UNITS tablet Take 1,000 Units by mouth every morning.     citalopram (CELEXA) 20 MG tablet TAKE 1 TABLET BY MOUTH TWICE A DAY 180 tablet 1   clindamycin-benzoyl peroxide (BENZACLIN)  gel Apply topically 2 (two) times daily. 25 g 2   fish oil-omega-3 fatty acids 1000 MG capsule Take 2 g by mouth daily.     fluticasone (FLONASE) 50 MCG/ACT nasal spray PLACE 2 SPRAYS INTO BOTH NOSTRILS DAILY AS NEEDED FOR ALLERGIES OR RHINITIS. 48 mL 2   furosemide (LASIX) 20 MG tablet TAKE 1 TABLET BY MOUTH EVERY DAY AS NEEDED 90 tablet 1   methocarbamol (ROBAXIN) 500 MG tablet Take 1 tablet (500 mg total) by mouth 2 (two) times daily. 20 tablet 0   Multiple Vitamin (MULTIVITAMIN) tablet Take 1 tablet by mouth every morning.     OLANZapine (ZYPREXA) 2.5 MG tablet TAKE 1 TABLET BY MOUTH AT BEDTIME. 90 tablet 1   OLANZapine (ZYPREXA) 5 MG tablet TAKE 1 TABLET BY MOUTH EVERYDAY AT BEDTIME 90 tablet 2   tiZANidine (ZANAFLEX) 4 MG capsule Take 1 capsule (4 mg total) by mouth 2 (two) times daily as needed for muscle spasms. 14 capsule 0   vitamin B-12 (CYANOCOBALAMIN) 1000 MCG tablet Take 1,000 mcg by mouth every morning.     vitamin C (ASCORBIC ACID) 500 MG tablet Take 500 mg by mouth daily.     amphetamine-dextroamphetamine (ADDERALL XR) 20 MG 24 hr capsule Take 1 capsule (20 mg total) by mouth in the morning, at noon, and at bedtime. 90 capsule 0   No facility-administered medications prior to visit.    Allergies  Allergen Reactions   Haloperidol Decanoate    Hydroxyzine Anxiety    EXCESSIVE ANXIOUNESS   Other Other (See Comments)    Reverse effect, super hyper   Penicillins Hives   Valium     Review of Systems  Constitutional:  Negative for chills and fever.  HENT:  Negative for congestion.   Respiratory:  Negative for cough.   Cardiovascular:  Negative for chest pain.  Gastrointestinal:  Negative for nausea and vomiting.  Musculoskeletal:  Negative for myalgias.  Neurological:  Negative for headaches.  Psychiatric/Behavioral:  Negative for depression.       Objective:    Physical Exam Constitutional:      Appearance: Normal appearance. He is not ill-appearing.   Neurological:     Mental Status: He is alert and oriented to person, place, and time.  Psychiatric:        Behavior: Behavior normal.        Judgment: Judgment normal.    There were no vitals taken for this visit. Wt Readings from Last 3 Encounters:  07/18/21 200 lb (  90.7 kg)  01/30/21 194 lb 12.8 oz (88.4 kg)  12/01/20 196 lb (88.9 kg)    Diabetic Foot Exam - Simple   No data filed    Lab Results  Component Value Date   WBC 8.7 01/30/2021   HGB 15.6 01/30/2021   HCT 45.5 01/30/2021   PLT 281.0 01/30/2021   GLUCOSE 73 01/30/2021   CHOL 203 (H) 01/30/2021   TRIG 131.0 01/30/2021   HDL 57.90 01/30/2021   LDLCALC 119 (H) 01/30/2021   ALT 9 01/30/2021   AST 15 01/30/2021   NA 141 01/30/2021   K 4.7 01/30/2021   CL 102 01/30/2021   CREATININE 1.05 01/30/2021   BUN 14 01/30/2021   CO2 31 01/30/2021   TSH 1.01 01/30/2021   HGBA1C 5.2 01/30/2021    Lab Results  Component Value Date   TSH 1.01 01/30/2021   Lab Results  Component Value Date   WBC 8.7 01/30/2021   HGB 15.6 01/30/2021   HCT 45.5 01/30/2021   MCV 84.5 01/30/2021   PLT 281.0 01/30/2021   Lab Results  Component Value Date   NA 141 01/30/2021   K 4.7 01/30/2021   CO2 31 01/30/2021   GLUCOSE 73 01/30/2021   BUN 14 01/30/2021   CREATININE 1.05 01/30/2021   BILITOT 0.6 01/30/2021   ALKPHOS 70 01/30/2021   AST 15 01/30/2021   ALT 9 01/30/2021   PROT 7.3 01/30/2021   ALBUMIN 4.8 01/30/2021   CALCIUM 10.1 01/30/2021   GFR 90.09 01/30/2021   Lab Results  Component Value Date   CHOL 203 (H) 01/30/2021   Lab Results  Component Value Date   HDL 57.90 01/30/2021   Lab Results  Component Value Date   LDLCALC 119 (H) 01/30/2021   Lab Results  Component Value Date   TRIG 131.0 01/30/2021   Lab Results  Component Value Date   CHOLHDL 4 01/30/2021   Lab Results  Component Value Date   HGBA1C 5.2 01/30/2021       Assessment & Plan:   Problem List Items Addressed This Visit     ADHD  (attention deficit hyperactivity disorder)    Due to national shortage he is unable to get his Adderall. He has used Ritalin in the past without difficulty will switch to Ritalin 20 mg po tid and reassess      Anxiety    Stable on current meds, no changes      Tachycardia - Primary   Relevant Orders   CBC   Hyperlipidemia, mild    encouraged heart healthy diet, avoid trans fats, minimize simple carbs and saturated fats. Increase exercise as tolerated      Relevant Orders   Lipid panel   TSH   Constipation   Relevant Orders   TSH   Hyperglycemia    hgba1c acceptable, minimize simple carbs. Increase exercise as tolerated.      Relevant Orders   Hemoglobin A1c   Comprehensive metabolic panel   TSH    Meds ordered this encounter  Medications   methylphenidate (RITALIN) 20 MG tablet    Sig: Take 1 tablet (20 mg total) by mouth 3 (three) times daily with meals.    Dispense:  90 tablet    Refill:  0    I discussed the assessment and treatment plan with the patient. The patient was provided an opportunity to ask questions and all were answered. The patient agreed with the plan and demonstrated an understanding of the instructions.  The patient was advised to call back or seek an in-person evaluation if the symptoms worsen or if the condition fails to improve as anticipated.  I provided 25 minutes of face-to-face time during this encounter.   I,Zite Okoli,acting as a Neurosurgeon for Danise Edge, MD.,have documented all relevant documentation on the behalf of Danise Edge, MD,as directed by  Danise Edge, MD while in the presence of Danise Edge, MD.   10  Danise Edge, MD [facility] at [facility] [phone] (phone) [phone] (fax)  [facility]

## 2021-11-27 NOTE — Assessment & Plan Note (Signed)
Stable on current meds, no changes. 

## 2021-11-27 NOTE — Assessment & Plan Note (Signed)
Due to national shortage he is unable to get his Adderall. He has used Ritalin in the past without difficulty will switch to Ritalin 20 mg po tid and reassess

## 2021-12-01 ENCOUNTER — Telehealth: Payer: Self-pay | Admitting: Family Medicine

## 2021-12-01 NOTE — Telephone Encounter (Signed)
Pt would like a referral sent to previous podiatrist he saw in 2021. [place] in [place].

## 2021-12-04 ENCOUNTER — Other Ambulatory Visit: Payer: Self-pay

## 2021-12-04 DIAGNOSIS — L600 Ingrowing nail: Secondary | ICD-10-CM

## 2021-12-08 ENCOUNTER — Telehealth: Payer: Self-pay | Admitting: Family Medicine

## 2021-12-08 NOTE — Telephone Encounter (Signed)
Copied from CRM [id number]. Topic: Medicare AWV >> Dec 08, 2021 10:39 AM Claudette Laws R wrote: Reason for CRM:  Left message for patient to call back and schedule Medicare Annual Wellness Visit (AWV) in office.   If not able to come in office, please offer to do virtually or by telephone.   Last AWV: 10/02/2016  Please schedule at anytime with [facility].

## 2021-12-13 ENCOUNTER — Ambulatory Visit: Payer: Medicare Other | Admitting: Podiatry

## 2021-12-14 ENCOUNTER — Other Ambulatory Visit (HOSPITAL_BASED_OUTPATIENT_CLINIC_OR_DEPARTMENT_OTHER): Payer: Self-pay

## 2021-12-19 ENCOUNTER — Other Ambulatory Visit (HOSPITAL_BASED_OUTPATIENT_CLINIC_OR_DEPARTMENT_OTHER): Payer: Self-pay

## 2021-12-19 ENCOUNTER — Other Ambulatory Visit: Payer: Self-pay | Admitting: Family Medicine

## 2021-12-19 ENCOUNTER — Telehealth: Payer: Self-pay

## 2021-12-19 MED ORDER — ALPRAZOLAM ER 3 MG PO TB24
3.0000 mg | ORAL_TABLET | Freq: Every morning | ORAL | 3 refills | Status: DC
Start: 2021-12-19 — End: 2021-12-20

## 2021-12-19 NOTE — Telephone Encounter (Signed)
Caller Name [place] Phone Number [phone] Patient Name Jesse Jones Patient DOB 1981-03-28 Call Type Message Only Information Provided Reason for Call Medication Question / Request Initial Comment Caller states he needs a refill on medication. Pharmacy states until Thursday but he will be out tomorrow Disp. Time Disposition Final User 12/19/2021 7:47:01 AM General Information Provided Yes Idolina Primer Call Closed By: Idolina Primer Transaction Date/Time: 12/19/2021 7:44:22 AM (ET)

## 2021-12-19 NOTE — Telephone Encounter (Signed)
Spoke with Romeo Apple at pharmacy to ok early refill for Alprazolam and will get it ready.  Left detailed message on patients vm.

## 2021-12-19 NOTE — Telephone Encounter (Signed)
Patient is needing his alprazolam filled.  Pharmacy stated he can get it filled on Thursday, but he is due today.  He last filled and picked up on 11/17/21.  Pharmacy need ok to fill.

## 2021-12-20 ENCOUNTER — Telehealth: Payer: Self-pay | Admitting: Family Medicine

## 2021-12-20 ENCOUNTER — Other Ambulatory Visit (HOSPITAL_BASED_OUTPATIENT_CLINIC_OR_DEPARTMENT_OTHER): Payer: Self-pay

## 2021-12-20 MED ORDER — ALPRAZOLAM ER 3 MG PO TB24
3.0000 mg | ORAL_TABLET | Freq: Every morning | ORAL | 3 refills | Status: DC
Start: 2021-12-20 — End: 2022-03-06
  Filled 2021-12-20: qty 30, 30d supply, fill #0
  Filled 2022-01-18: qty 30, 30d supply, fill #1
  Filled 2022-02-16: qty 30, 30d supply, fill #2

## 2021-12-20 NOTE — Telephone Encounter (Signed)
Pt's dad contacted office regarding recent medication order for Alprazolam. Pt's dad states medication was sent to the wrong pharmacy, and was suppose to be sent to [place] due to cost difference. Pt's dad would like a call back with updated information.   Please advise.  [phone]

## 2021-12-20 NOTE — Telephone Encounter (Signed)
Patient notified that rx has been sent in. 

## 2021-12-20 NOTE — Telephone Encounter (Signed)
Prescription resent as requested.

## 2021-12-20 NOTE — Telephone Encounter (Signed)
Patient had initially called for an early refill approval (can see on previous phone message).  Dr. Zola Button early fill but Dr. Charlett Blake [facility] (which [facility]  We need rx sent to [place] for his Alprazolam.  [facility].  Please help.

## 2021-12-27 ENCOUNTER — Other Ambulatory Visit: Payer: Self-pay | Admitting: Family Medicine

## 2021-12-27 DIAGNOSIS — F908 Attention-deficit hyperactivity disorder, other type: Secondary | ICD-10-CM

## 2021-12-27 MED ORDER — METHYLPHENIDATE HCL 20 MG PO TABS
20.0000 mg | ORAL_TABLET | Freq: Three times a day (TID) | ORAL | 0 refills | Status: DC
Start: 2021-12-27 — End: 2022-01-23

## 2021-12-27 NOTE — Telephone Encounter (Signed)
Medication: methylphenidate (RITALIN) 20 MG tablet  ? ?Has the patient contacted their pharmacy? No. ? ? ?[id number] [place], [place] - [address] AT [facility] [address]  ?[address], [place] [address]  ?Phone:  [phone]  Fax:  [phone]  ? ? ?

## 2021-12-27 NOTE — Telephone Encounter (Signed)
Requesting: methylphenidate 20MG   ?Contract:  ?UDS: 06/23/20 ?Last Visit: 11/27/21 ?Next Visit: Not scheduled ?Last Refill: 11/27/21, #90, 0 refills ? ?Please Advise  ?

## 2021-12-29 ENCOUNTER — Telehealth: Payer: Self-pay | Admitting: Family Medicine

## 2021-12-29 NOTE — Telephone Encounter (Signed)
Left message for patient to call back and schedule Medicare Annual Wellness Visit (AWV) in office.  ° °If not able to come in office, please offer to do virtually or by telephone.  Left office number and my jabber #[phone]. ° °Due for AWVI ° °Please schedule at anytime with Nurse Health Advisor. °  °

## 2022-01-16 ENCOUNTER — Other Ambulatory Visit (HOSPITAL_BASED_OUTPATIENT_CLINIC_OR_DEPARTMENT_OTHER): Payer: Self-pay

## 2022-01-18 ENCOUNTER — Other Ambulatory Visit (HOSPITAL_BASED_OUTPATIENT_CLINIC_OR_DEPARTMENT_OTHER): Payer: Self-pay

## 2022-01-19 ENCOUNTER — Other Ambulatory Visit (HOSPITAL_BASED_OUTPATIENT_CLINIC_OR_DEPARTMENT_OTHER): Payer: Self-pay

## 2022-01-23 ENCOUNTER — Other Ambulatory Visit: Payer: Self-pay | Admitting: Family Medicine

## 2022-01-23 DIAGNOSIS — F908 Attention-deficit hyperactivity disorder, other type: Secondary | ICD-10-CM

## 2022-01-24 MED ORDER — METHYLPHENIDATE HCL 20 MG PO TABS: 20.0000 mg | ORAL_TABLET | Freq: Three times a day (TID) | ORAL | 0 refills | Status: DC

## 2022-01-24 NOTE — Telephone Encounter (Signed)
Requesting: methylphenidate ?Contract:  ?UDS: 06/23/20 ?Last Visit: 11/27/21 ?Next Visit: none ?Last Refill: 12/27/21 ? ?Pt called to make next appt but had to leave message.  Pt due around 05/29/22 for cpe or follow up if unable to do cpe. ? ?Please Advise ? ?

## 2022-01-31 ENCOUNTER — Ambulatory Visit (INDEPENDENT_AMBULATORY_CARE_PROVIDER_SITE_OTHER): Payer: Medicare Other | Admitting: Family Medicine

## 2022-01-31 ENCOUNTER — Other Ambulatory Visit: Payer: Self-pay | Admitting: *Deleted

## 2022-01-31 ENCOUNTER — Encounter: Payer: Self-pay | Admitting: Family Medicine

## 2022-01-31 VITALS — BP 110/81 | HR 106 | Ht 74.0 in | Wt 196.0 lb

## 2022-01-31 DIAGNOSIS — Z79899 Other long term (current) drug therapy: Secondary | ICD-10-CM

## 2022-01-31 DIAGNOSIS — K5900 Constipation, unspecified: Secondary | ICD-10-CM | POA: Diagnosis not present

## 2022-01-31 DIAGNOSIS — R000 Tachycardia, unspecified: Secondary | ICD-10-CM

## 2022-01-31 DIAGNOSIS — R739 Hyperglycemia, unspecified: Secondary | ICD-10-CM

## 2022-01-31 DIAGNOSIS — E785 Hyperlipidemia, unspecified: Secondary | ICD-10-CM

## 2022-01-31 DIAGNOSIS — F339 Major depressive disorder, recurrent, unspecified: Secondary | ICD-10-CM | POA: Diagnosis not present

## 2022-01-31 DIAGNOSIS — F5101 Primary insomnia: Secondary | ICD-10-CM

## 2022-01-31 DIAGNOSIS — F908 Attention-deficit hyperactivity disorder, other type: Secondary | ICD-10-CM

## 2022-01-31 DIAGNOSIS — L600 Ingrowing nail: Secondary | ICD-10-CM | POA: Diagnosis not present

## 2022-01-31 MED ORDER — DOXYCYCLINE HYCLATE 100 MG PO TABS: 100.0000 mg | ORAL_TABLET | Freq: Two times a day (BID) | ORAL | 0 refills | Status: AC

## 2022-01-31 NOTE — Patient Instructions (Addendum)
Depression: ?Increase Celexa to 40 mg daily ?Continue lifestyle factors and exercise to boost mood ? ?Ingrown nail: ?Start antibiotics ?Continue supportive measures - see handout ?Schedule appointment with podiatrist ? ?Review handout on quality sleep. Try to avoid taking your Ritalin late in the day, decrease caffeine intake and avoid late in the day, decrease screen time, etc.  ?

## 2022-01-31 NOTE — Progress Notes (Signed)
? ?Acute Office Visit ? ?Subjective:  ? ? Patient ID: Jesse Jones, male    DOB: Aug 07, 1981, 41 y.o.   MRN: [id number] ? ?CC: ingrown nail with infection; depression; insomnia ? ? ?HPI ?Patient is in today for toenail, depression, insomnia.  Dad is present with him for visit. ? ? ?Toenail: ?Patient reports history of ingrown toenails with occasional infections.  States he has had this nail partially removed in the past but not fully to the root.  Right great toe medial nail started bothering him off and on about a month ago but over the past week it has started to look infected with redness and swelling.  He has been doing supportive measures at home including soaks, wraps, Neosporin without much improvement.  Reports that he is established with a podiatrist and is planning to call them to get the nail removed after finishing a course of antibiotics for the infection.  He denies any fevers, spreading erythema/inflammation, rashes. ? ? ?Depression: ?Patient reports ever since Christmas he has felt like his depression has been gradually worsening.  He has not left his house in about 3 months or so.  States he is really felt in the right for at least the past 2 to 3 weeks consistently.  He has been sleeping majority of the day and then being up all night, no interest in doing activities.  States that over the past 4 days or so he is has started to notice improvement and has started going outside for activities.  Reports he had a similar " funk" in the past when he stopped taking his Celexa.  Once he restarted that he quickly improved.  Has been taking Celexa 20 mg daily although it is prescribed for up to twice daily.  He is not sure the 20 mg daily is enough at this point.  He has done counseling in the past but did not find it helpful.  He has been doing really well with coping, reading the Bible and self-help books, reports he has a good support system with friends/family who is very open with.  He does have some  goals to get back into mountain biking and go back to school soon.  Reports he feels like he is making progress but thinks he needs an increase in the Celexa to help jumpstart this.  He denies any suicidal/homicidal ideation. ? ? ?Insomnia: ?Patient reports he tried an over-the-counter sleep aid several months ago but felt like it was making him sleepy throughout the day so he stopped taking it.  Reports that he typically tries to go to bed around 10 or 11 PM but sometimes would not fall asleep until 3 AM.  However dad reports that he tends to sleep all day and then stay up all night, this tends to be worse during depression flares.  Patient is asking for insomnia medication, but discussed that he is on several medications and has some lifestyle factors to change prior to starting sleep medication.  Reports that he typically takes his last dose of Ritalin around 6 PM, so this very well could be contributing to difficulty falling asleep around 10 PM. Additionally he is drinking caffeine throughout the day although he reports switching to green tea in the evenings. ? ? ? ? ?Past Medical History:  ?Diagnosis Date  ? ADD (attention deficit disorder)   ? ADHD (attention deficit hyperactivity disorder) 04/02/2011  ? 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 Anxiety   ? Anxiety and depression  04/02/2011  ? Concussion   ? X 6- 7  ? 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 Depression   ? ED (erectile dysfunction) 05/12/2012  ? Hyperlipidemia, mild 04/18/2015  ? Insomnia   ? 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  ? Outbursts of anger   ? Preventative health care 10/03/2011  ? Tobacco abuse 04/28/2011  ? ? ?Past Surgical History:  ?Procedure Laterality Date  ? NASAL SEPTUM SURGERY Bilateral   ? Dr Jenne PaneBates 2017  ? punctured lung    ? toe surgeries  during childhood   ? for ingrown toenails  ? ? ?Family History  ?Problem Relation Age of Onset  ? Depression Mother   ? Anxiety disorder Mother   ? Depression Brother   ? Diabetes Brother   ?      type 1  ? Cancer Paternal Grandmother   ?     lung/ didn't smoke  ? Cancer Maternal Uncle [id number]  ?     colon cancer  ? ? ?Social History  ? ?Socioeconomic History  ? Marital status: Single  ?  Spouse name: Not on file  ? Number of children: Not on file  ? Years of education: Not on file  ? Highest education level: Not on file  ?Occupational History  ? Not on file  ?Tobacco Use  ? Smoking status: Former  ?  Packs/day: 0.30  ?  Years: 20.00  ?  Pack years: 6.00  ?  Types: Cigarettes  ? Smokeless tobacco: Never  ?Vaping Use  ? Vaping Use: Never used  ?Substance and Sexual Activity  ? Alcohol use: Yes  ?  Alcohol/week: 36.0 standard drinks  ?  Types: 36 Cans of beer per week  ?  Comment: sober x7679yr. No alcohol.  ? Drug use: Yes  ?  Types: Marijuana, Other-see comments  ?  Comment: pt stopped 4 year ago  ? Sexual activity: Yes  ?  Partners: Female  ?  Comment: lives alone and eating well. exercising  ?Other Topics Concern  ? Not on file  ?Social History Narrative  ? Not on file  ? ?Social Determinants of Health  ? ?Financial Resource Strain: Not on file  ?Food Insecurity: Not on file  ?Transportation Needs: Not on file  ?Physical Activity: Not on file  ?Stress: Not on file  ?Social Connections: Not on file  ?Intimate Partner Violence: Not on file  ? ? ?Outpatient Medications Prior to Visit  ?Medication Sig Dispense Refill  ? ALPRAZolam (XANAX XR) 3 MG 24 hr tablet Take 1 tablet (3 mg total) by mouth every morning. 30 tablet 3  ? cholecalciferol (VITAMIN D) 1000 UNITS tablet Take 1,000 Units by mouth every morning.    ? citalopram (CELEXA) 20 MG tablet TAKE 1 TABLET BY MOUTH TWICE A DAY 180 tablet 1  ? clindamycin-benzoyl peroxide (BENZACLIN) gel Apply topically 2 (two) times daily. 25 g 2  ? fish oil-omega-3 fatty acids 1000 MG capsule Take 2 g by mouth daily.    ? fluticasone (FLONASE) 50 MCG/ACT nasal spray PLACE 2 SPRAYS INTO BOTH NOSTRILS DAILY AS NEEDED FOR ALLERGIES OR RHINITIS. 48 mL 2  ? furosemide (LASIX) 20 MG  tablet TAKE 1 TABLET BY MOUTH EVERY DAY AS NEEDED 90 tablet 1  ? methocarbamol (ROBAXIN) 500 MG tablet Take 1 tablet (500 mg total) by mouth 2 (two) times daily. 20 tablet 0  ? methylphenidate (RITALIN) 20 MG tablet Take 1 tablet (20 mg total) by mouth 3 (three) times daily with  meals. 90 tablet 0  ? Multiple Vitamin (MULTIVITAMIN) tablet Take 1 tablet by mouth every morning.    ? OLANZapine (ZYPREXA) 2.5 MG tablet TAKE 1 TABLET BY MOUTH AT BEDTIME. 90 tablet 1  ? OLANZapine (ZYPREXA) 5 MG tablet TAKE 1 TABLET BY MOUTH EVERYDAY AT BEDTIME 90 tablet 2  ? tiZANidine (ZANAFLEX) 4 MG capsule Take 1 capsule (4 mg total) by mouth 2 (two) times daily as needed for muscle spasms. 14 capsule 0  ? vitamin B-12 (CYANOCOBALAMIN) 1000 MCG tablet Take 1,000 mcg by mouth every morning.    ? vitamin C (ASCORBIC ACID) 500 MG tablet Take 500 mg by mouth daily.    ? ?No facility-administered medications prior to visit.  ? ? ?Allergies  ?Allergen Reactions  ? Haloperidol Decanoate   ? Hydroxyzine Anxiety  ?  EXCESSIVE ANXIOUNESS  ? Other Other (See Comments)  ?  Reverse effect, super hyper  ? Penicillins Hives  ? Valium   ? ? ?Review of Systems ?All review of systems negative except what is listed in the HPI ? ?   ?Objective:  ?  ?Physical Exam ?Vitals reviewed.  ?Constitutional:   ?   Appearance: Normal appearance. He is normal weight.  ?Cardiovascular:  ?   Rate and Rhythm: Normal rate and regular rhythm.  ?   Pulses: Normal pulses.  ?Pulmonary:  ?   Effort: Pulmonary effort is normal.  ?   Breath sounds: Normal breath sounds.  ?Skin: ?   General: Skin is warm and dry.  ?   Capillary Refill: Capillary refill takes less than 2 seconds.  ?   Comments: See picture of right great toe ingrown nail, edema, erythema, warmth, tenderness; no drainage  ?Neurological:  ?   General: No focal deficit present.  ?   Mental Status: He is alert and oriented to person, place, and time. Mental status is at baseline.  ?Psychiatric:     ?   Mood and  Affect: Mood normal.     ?   Behavior: Behavior normal.     ?   Thought Content: Thought content normal.     ?   Judgment: Judgment normal.  ? ? ? ? ? ? ? ? ? ?There were no vitals taken for this visit. ?Wt

## 2022-02-01 LAB — COMPREHENSIVE METABOLIC PANEL
ALT: 14 U/L (ref 0–53)
AST: 21 U/L (ref 0–37)
Albumin: 5 g/dL (ref 3.5–5.2)
Alkaline Phosphatase: 67 U/L (ref 39–117)
BUN: 12 mg/dL (ref 6–23)
CO2: 30 mEq/L (ref 19–32)
Calcium: 10.1 mg/dL (ref 8.4–10.5)
Chloride: 102 mEq/L (ref 96–112)
Creatinine, Ser: 1.19 mg/dL (ref 0.40–1.50)
GFR: 76.98 mL/min (ref >60.00)
Glucose, Bld: 85 mg/dL (ref 70–99)
Potassium: 4.4 mEq/L (ref 3.5–5.1)
Sodium: 140 mEq/L (ref 135–145)
Total Bilirubin: 0.9 mg/dL (ref 0.2–1.2)
Total Protein: 7.2 g/dL (ref 6.0–8.3)

## 2022-02-01 LAB — CBC
HCT: 45.4 % (ref 39.0–52.0)
Hemoglobin: 15.4 g/dL (ref 13.0–17.0)
MCHC: 34 g/dL (ref 30.0–36.0)
MCV: 86.4 fl (ref 78.0–100.0)
Platelets: 286 10*3/uL (ref 150.0–400.0)
RBC: 5.25 Mil/uL (ref 4.22–5.81)
RDW: 13.8 % (ref 11.5–15.5)
WBC: 7.5 10*3/uL (ref 4.0–10.5)

## 2022-02-01 LAB — LIPID PANEL
Cholesterol: 193 mg/dL (ref 0–200)
HDL: 61.4 mg/dL (ref >39.00)
LDL Cholesterol: 109 mg/dL — ABNORMAL HIGH (ref 0–99)
NonHDL: 132.03
Total CHOL/HDL Ratio: 3
Triglycerides: 113 mg/dL (ref 0.0–149.0)
VLDL: 22.6 mg/dL (ref 0.0–40.0)

## 2022-02-01 LAB — TSH: TSH: 1.18 u[IU]/mL (ref 0.35–5.50)

## 2022-02-01 LAB — HEMOGLOBIN A1C: Hgb A1c MFr Bld: 5.1 % (ref 4.6–6.5)

## 2022-02-04 LAB — DRUG MONITORING PANEL 376104, URINE

## 2022-02-04 LAB — DM TEMPLATE

## 2022-02-12 ENCOUNTER — Telehealth: Payer: Self-pay | Admitting: Family Medicine

## 2022-02-12 NOTE — Telephone Encounter (Signed)
Pt called stating he needed a refill on doxycycline since he is out of the medication. He would like to know if he can start this medication again. Please Advise. ? ?Medication:  ? ?doxycycline (VIBRA-TABS) 100 MG tablet [[id number]]  ENDED ? ?Has the patient contacted their pharmacy? No. ?(If no, request that the patient contact the pharmacy for the refill.) ?(If yes, when and what did the pharmacy advise?) ? ?Preferred Pharmacy (with phone number or street name):  ? ?[id number] [place], [place] - [address] AT [facility] [address] [facility] [address]  ?[address], [place] [address]  ?Phone:  [phone]  Fax:  [phone]  ? ?Agent: Please be advised that RX refills may take up to 3 business days. We ask that you follow-up with your pharmacy.  ?

## 2022-02-13 ENCOUNTER — Other Ambulatory Visit (HOSPITAL_BASED_OUTPATIENT_CLINIC_OR_DEPARTMENT_OTHER): Payer: Self-pay

## 2022-02-14 ENCOUNTER — Other Ambulatory Visit: Payer: Self-pay

## 2022-02-14 DIAGNOSIS — L600 Ingrowing nail: Secondary | ICD-10-CM

## 2022-02-14 NOTE — Telephone Encounter (Signed)
Sent pt mychart message regarding podiatrist.  ?

## 2022-02-14 NOTE — Telephone Encounter (Signed)
Referral has been ordered

## 2022-02-16 ENCOUNTER — Other Ambulatory Visit (HOSPITAL_BASED_OUTPATIENT_CLINIC_OR_DEPARTMENT_OTHER): Payer: Self-pay

## 2022-02-16 ENCOUNTER — Other Ambulatory Visit: Payer: Self-pay | Admitting: Family Medicine

## 2022-02-22 ENCOUNTER — Other Ambulatory Visit: Payer: Self-pay | Admitting: Family Medicine

## 2022-02-22 DIAGNOSIS — F908 Attention-deficit hyperactivity disorder, other type: Secondary | ICD-10-CM

## 2022-02-23 ENCOUNTER — Encounter: Payer: Self-pay | Admitting: Podiatry

## 2022-02-23 ENCOUNTER — Ambulatory Visit (INDEPENDENT_AMBULATORY_CARE_PROVIDER_SITE_OTHER): Payer: Medicare Other | Admitting: Podiatry

## 2022-02-23 DIAGNOSIS — L600 Ingrowing nail: Secondary | ICD-10-CM

## 2022-02-23 MED ORDER — METHYLPHENIDATE HCL 20 MG PO TABS: 20.0000 mg | ORAL_TABLET | Freq: Three times a day (TID) | ORAL | 0 refills | Status: DC

## 2022-02-23 NOTE — Telephone Encounter (Signed)
Requesting: Ritalin 20mg  ?Contract:  ?UDS: 01/31/22 ?Last Visit: 01/31/22 ?Next Visit: none ?Last Refill: 01/24/22 ? ?Please Advise ? ?

## 2022-02-23 NOTE — Patient Instructions (Signed)

## 2022-02-23 NOTE — Progress Notes (Signed)
?Subjective:  ?Patient ID: Jesse Jones, male    DOB: 04/22/1981,  MRN: [id number] ? ?Chief Complaint  ?Patient presents with  ? Ingrown Toenail  ?  Right hallux- pt reports he has been dealing w ingrown for almost 3-4 months- mentioned he is needing further treatment  ?PCP gave doxy for the infection in toe   ? ? ?41 y.o. male presents with the above complaint.  Patient presents with right lateral border ingrown.  Patient states is painful to touch is progressive gotten worse.  Patient would like to have it removed.  He has not seen anyone else prior to seeing me.  It started to cause him pain.  He was doing a lot with his feet and is some type of shoes.  He denies any other acute complaints. ? ? ?Review of Systems: Negative except as noted in the HPI. Denies N/V/F/Ch. ? ?Past Medical History:  ?Diagnosis Date  ? ADD (attention deficit disorder)   ? ADHD (attention deficit hyperactivity disorder) 04/02/2011  ? 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 Anxiety   ? Anxiety and depression 04/02/2011  ? Concussion   ? X 6- 7  ? 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 Depression   ? ED (erectile dysfunction) 05/12/2012  ? Hyperlipidemia, mild 04/18/2015  ? Insomnia   ? 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  ? Outbursts of anger   ? Preventative health care 10/03/2011  ? Tobacco abuse 04/28/2011  ? ? ?Current Outpatient Medications:  ?  ALPRAZolam (XANAX XR) 3 MG 24 hr tablet, Take 1 tablet (3 mg total) by mouth every morning., Disp: 30 tablet, Rfl: 3 ?  cholecalciferol (VITAMIN D) 1000 UNITS tablet, Take 1,000 Units by mouth every morning., Disp: , Rfl:  ?  citalopram (CELEXA) 20 MG tablet, TAKE 1 TABLET BY MOUTH TWICE A DAY, Disp: 180 tablet, Rfl: 1 ?  clindamycin-benzoyl peroxide (BENZACLIN) gel, Apply topically 2 (two) times daily., Disp: 25 g, Rfl: 2 ?  fish oil-omega-3 fatty acids 1000 MG capsule, Take 2 g by mouth daily., Disp: , Rfl:  ?  fluticasone (FLONASE) 50 MCG/ACT nasal spray, PLACE 2 SPRAYS INTO BOTH NOSTRILS  DAILY AS NEEDED FOR ALLERGIES OR RHINITIS., Disp: 48 mL, Rfl: 2 ?  furosemide (LASIX) 20 MG tablet, TAKE 1 TABLET BY MOUTH EVERY DAY AS NEEDED, Disp: 90 tablet, Rfl: 1 ?  methocarbamol (ROBAXIN) 500 MG tablet, Take 1 tablet (500 mg total) by mouth 2 (two) times daily., Disp: 20 tablet, Rfl: 0 ?  methylphenidate (RITALIN) 20 MG tablet, Take 1 tablet (20 mg total) by mouth 3 (three) times daily with meals., Disp: 90 tablet, Rfl: 0 ?  Multiple Vitamin (MULTIVITAMIN) tablet, Take 1 tablet by mouth every morning., Disp: , Rfl:  ?  OLANZapine (ZYPREXA) 5 MG tablet, TAKE 1 TABLET BY MOUTH EVERYDAY AT BEDTIME, Disp: 90 tablet, Rfl: 2 ?  tiZANidine (ZANAFLEX) 4 MG capsule, Take 1 capsule (4 mg total) by mouth 2 (two) times daily as needed for muscle spasms., Disp: 14 capsule, Rfl: 0 ?  vitamin B-12 (CYANOCOBALAMIN) 1000 MCG tablet, Take 1,000 mcg by mouth every morning., Disp: , Rfl:  ?  vitamin C (ASCORBIC ACID) 500 MG tablet, Take 500 mg by mouth daily., Disp: , Rfl:  ? ?Social History  ? ?Tobacco Use  ?Smoking Status Former  ? Packs/day: 0.30  ? Years: 20.00  ? Pack years: 6.00  ? Types: Cigarettes  ?Smokeless Tobacco Never  ? ? ?Allergies  ?Allergen Reactions  ? Haloperidol  Decanoate   ? Hydroxyzine Anxiety  ?  EXCESSIVE ANXIOUNESS  ? Other Other (See Comments)  ?  Reverse effect, super hyper  ? Penicillins Hives  ? Valium   ? ?Objective:  ?There were no vitals filed for this visit. ?There is no height or weight on file to calculate BMI. ?Constitutional Well developed. ?Well nourished.  ?Vascular Dorsalis pedis pulses palpable bilaterally. ?Posterior tibial pulses palpable bilaterally. ?Capillary refill normal to all digits.  ?No cyanosis or clubbing noted. ?Pedal hair growth normal.  ?Neurologic Normal speech. ?Oriented to person, place, and time. ?Epicritic sensation to light touch grossly present bilaterally.  ?Dermatologic Painful ingrowing nail at lateral nail borders of the hallux nail right. ?No other open  wounds. ?No skin lesions.  ?Orthopedic: Normal joint ROM without pain or crepitus bilaterally. ?No visible deformities. ?No bony tenderness.  ? ?Radiographs: None ?Assessment:  ? ?1. Ingrown toenail of right foot   ? ?Plan:  ?Patient was evaluated and treated and all questions answered. ? ?Ingrown Nail, right ?-Patient elects to proceed with minor surgery to remove ingrown toenail removal today. Consent reviewed and signed by patient. ?-Ingrown nail excised. See procedure note. ?-Educated on post-procedure care including soaking. Written instructions provided and reviewed. ?-Patient to follow up in 2 weeks for nail check. ? ?Procedure: Excision of Ingrown Toenail ?Location: Right 1st toe lateral nail borders. ?Anesthesia: Lidocaine 1% plain; 1.5 mL and Marcaine 0.5% plain; 1.5 mL, digital block. ?Skin Prep: Betadine. ?Dressing: Silvadene; telfa; dry, sterile, compression dressing. ?Technique: Following skin prep, the toe was exsanguinated and a tourniquet was secured at the base of the toe. The affected nail border was freed, split with a nail splitter, and excised. Chemical matrixectomy was then performed with phenol and irrigated out with alcohol. The tourniquet was then removed and sterile dressing applied. ?Disposition: Patient tolerated procedure well. Patient to return in 2 weeks for follow-up.  ? ?No follow-ups on file. ?

## 2022-02-27 ENCOUNTER — Telehealth: Payer: Self-pay | Admitting: *Deleted

## 2022-02-27 ENCOUNTER — Telehealth: Payer: Self-pay | Admitting: Family Medicine

## 2022-02-27 ENCOUNTER — Telehealth (INDEPENDENT_AMBULATORY_CARE_PROVIDER_SITE_OTHER): Payer: Medicare Other | Admitting: Family Medicine

## 2022-02-27 ENCOUNTER — Encounter: Payer: Self-pay | Admitting: Family Medicine

## 2022-02-27 DIAGNOSIS — F3160 Bipolar disorder, current episode mixed, unspecified: Secondary | ICD-10-CM | POA: Diagnosis not present

## 2022-02-27 DIAGNOSIS — F419 Anxiety disorder, unspecified: Secondary | ICD-10-CM | POA: Diagnosis not present

## 2022-02-27 DIAGNOSIS — F908 Attention-deficit hyperactivity disorder, other type: Secondary | ICD-10-CM

## 2022-02-27 DIAGNOSIS — R45851 Suicidal ideations: Secondary | ICD-10-CM

## 2022-02-27 MED ORDER — OLANZAPINE 10 MG PO TABS: 10.0000 mg | ORAL_TABLET | Freq: Every day | ORAL | 3 refills | Status: DC

## 2022-02-27 MED ORDER — LISDEXAMFETAMINE DIMESYLATE 50 MG PO CAPS: 50.0000 mg | ORAL_CAPSULE | Freq: Every day | ORAL | 0 refills | Status: DC

## 2022-02-27 MED ORDER — CITALOPRAM HYDROBROMIDE 20 MG PO TABS
20.0000 mg | ORAL_TABLET | Freq: Every day | ORAL | 1 refills | Status: DC
Start: 2022-02-27 — End: 2022-04-20

## 2022-02-27 NOTE — Telephone Encounter (Signed)
Called pt's dad, no answer. ?Called pt, answered then hung up. ?

## 2022-02-27 NOTE — Assessment & Plan Note (Signed)
Due to the Adderall shortage we were forced to switch him to Ritalin several months ago and he correlates this change with him feeling worse emotionally. With the change he began having night terrors again which he reports he previously took in childhood and he did note night terrors then. Will try to get him a course of Vyvanse to see if that is better tolerated.  ?

## 2022-02-27 NOTE — Chronic Care Management (AMB) (Signed)
?  Chronic Care Management  ? ?Note ? ?02/27/2022 ?Name: Jesse Jones MRN: [id number] DOB: 05-23-1981 ? ?Jesse Jones is a 41 y.o. year old male who is a primary care patient of Mosie Lukes, MD. I reached out to [name] by phone today in response to a referral sent by Mr. Jesse Jones PCP. ? ?Jesse Jones was given information about Chronic Care Management services today including:  ?CCM service includes personalized support from designated clinical staff supervised by his physician, including individualized plan of care and coordination with other care providers ?24/7 contact phone numbers for assistance for urgent and routine care needs. ?Service will only be billed when office clinical staff spend 20 minutes or more in a month to coordinate care. ?Only one practitioner may furnish and bill the service in a calendar month. ?The patient may stop CCM services at any time (effective at the end of the month) by phone call to the office staff. ?The patient is responsible for co-pay (up to 20% after annual deductible is met) if co-pay is required by the individual health plan.  ? ?Patient agreed to services and verbal consent obtained.  ? ?Follow up plan: ?Telephone appointment with care management team member scheduled for: 03/02/2022 ? ?Nithin Demeo, CCMA ?Care Guide, Embedded Care Coordination ?[facility]  ?Direct Dial: [phone] ? ? ?

## 2022-02-27 NOTE — Telephone Encounter (Signed)
Spoke to Jesse Jones's father and Jesse Jones. Declined to call [facility]. Requested VV with Dr. Charlett Blake, scheduled at 21 today. Jesse Jones dad states he thinks its medication related and Jesse Jones needs medication adjusted. Dr. Charlett Blake aware.  ?

## 2022-02-27 NOTE — Assessment & Plan Note (Addendum)
Afraid to sleep due to night terrors, stop Melatonin and Ritalin, increase Olanzapine to 10 mg qhs and reassess. He endorses anhedonia and fleeting thoughts of suicide but he denies any active plan. He agrees to stay with family and they have locked up knives etc. He is referred for counseling as well. They will contact us if symptoms change and he is given contact info for [facility] and [facility] to seek care rapidly if  ?

## 2022-02-27 NOTE — Telephone Encounter (Signed)
Patients dad Gerlene Burdock called regarding patient having suicidal thoughts, depression and sleep insomnia. ? ?Patients dad would like for patient to see blyth in person or vv today  ? ?Warm transferred patients dad to triage patient is with his dad  ?

## 2022-02-27 NOTE — Telephone Encounter (Signed)
Patient scheduled with PCP today at 11am.  ? ?[facility] - Client TELEPHONE ADVICE RECORD [facility]? ?Patient Name: Jesse Jones ?Chief Complaint SUICIDE - threatening harm to self or others ?Reason for Call Symptomatic / Request for Health Information ?Initial Comment Caller states his son has depression, insomnia, and ?suicidal thoughts. ?Translation No ?Nurse Assessment ?Nurse: Elesa Hacker RN, Nash Dimmer Date/Time Lamount Cohen Time): 02/27/2022 9:34:39 AM ?Confirm and document reason for call. If ?symptomatic, describe symptoms. ?---Caller states his son has depression, insomnia, ?and suicidal thoughts. Father advised that the severe ?depression has been going on for several months, ?unable to sleep. Has anxiety as well. Lives alone in ?his apartment , had to come back to the house and ?stay with parents. Mother asked him if he was having ?suicidal thoughts, he said yes last night. No plan. ?Does the patient have any new or worsening ?symptoms? ---Yes ?Will a triage be completed? ---Yes ?Related visit to physician within the last 2 weeks? ---No ?Does the PT have any chronic conditions? (i.e. ?diabetes, asthma, this includes High risk factors for ?pregnancy, etc.) ?---Yes ?List chronic conditions. ---major depression anxiety insomnia ?Is this a behavioral health or substance abuse call? ---Yes ?Are you having any thoughts or feelings of harming ?or killing yourself or someone else? ---Yes ?Do you have a weapon with you? ---No ?Are you alone? ---No ?Enter any comments. ---Father and mother with him. ?Are you currently experiencing any physical ?discomfort that you think may be related to the use ?of alcohol or other drugs? (use substance abuse or ?---No ?PLEASE NOTE: All timestamps contained within this report are represented as [place] Standard Time. ?CONFIDENTIALTY NOTICE: This fax transmission is intended only for the addressee. It contains information that is legally privileged, confidential  or ?otherwise protected from use or disclosure. If you are not the intended recipient, you are strictly prohibited from reviewing, disclosing, copying using ?or disseminating any of this information or taking any action in reliance on or regarding this information. If you have received this fax in error, please ?notify us immediately by telephone so that we can arrange for its return to [place]. Phone: [phone], Toll-Free: [phone], Fax: [phone] ?Page: 2 of 3 ?Call Id: [id number] ?Nurse Assessment ?alcohol abuse guidelines. These include withdrawal ?symptoms) ?Do you worry that you may be hearing or seeing ?things that others do not? ---Yes ?Do you take medications for your condition(s)? ---Yes ?List medications here. ---ADHD, Ritalin Depression citalopram Bi polar ?alpazolam ?Guidelines ?Guideline Title Affirmed Question Affirmed Notes Nurse Date/Time (Eastern ?Time) ?Bipolar Disorder ?(Manic Depression) ?[1] Bipolar disorder ?(manic depression) ?AND [2] unable to ?do any of normal ?activities (e.g., selfcare, school, work; ?in comparison to ?baseline). ?Elesa Hacker, RN, Nash Dimmer 02/27/2022 9:52:14 AM ?Disp. Time (Eastern ?Time) Disposition Final User ?02/27/2022 9:31:18 AM Send to Urgent Queue Asencion Gowda ?02/27/2022 10:00:54 AM Go to ED Now Yes Deaton, RN, Nash Dimmer ?Caller Disagree/Comply Disagree ?Caller Understands Yes ?PreDisposition Did not know what to do ?Care Advice Given Per Guideline ?GO TO ED NOW: * You need to be seen in the Emergency Department. * Go to the ED at ___________ Hospital. * Leave now. ?Drive carefully. ANOTHER ADULT SHOULD DRIVE: * It is better and safer if another adult drives instead of you. CARE ?ADVICE given per Bipolar Disorder (Manic Depression) (Adult) guideline. ?Comments ?User: Wandra Scot, RN Date/Time Lamount Cohen Time): 02/27/2022 10:08:26 AM ?Does not want to go to the ED. Would like a call back from the office for an appt. Father is with son, no suicidal ?thoughts /  plans at this  time. ?User: Wandra Scot, RN Date/Time Lamount Cohen Time): 02/27/2022 10:11:21 AM ?Office was called and caller was connected with the office for an appt time. Also gave father and son 38 crisis ?number. ?PLEASE NOTE: All timestamps contained within this report are represented as [place] Standard Time. ?CONFIDENTIALTY NOTICE: This fax transmission is intended only for the addressee. It contains information that is legally privileged, confidential or ?otherwise protected from use or disclosure. If you are not the intended recipient, you are strictly prohibited from reviewing, disclosing, copying using ?or disseminating any of this information or taking any action in reliance on or regarding this information. If you have received this fax in error, please ?notify us immediately by telephone so that we can arrange for its return to [place]. Phone: [phone], Toll-Free: [phone], Fax: [phone] ?Page: 3 of 3 ?Call Id: [id number] ?Referrals ?GO TO FACILITY REFUSE ?

## 2022-02-27 NOTE — Progress Notes (Signed)
? ? ?MyChart Video Visit ? ? ? ?Virtual Visit via Video Note  ? ?This visit type was conducted due to national recommendations for restrictions regarding the COVID-19 Pandemic (e.g. social distancing) in an effort to limit this patient's exposure and mitigate transmission in our community. This patient is at least at moderate risk for complications without adequate follow up. This format is felt to be most appropriate for this patient at this time. Physical exam was limited by quality of the video and audio technology used for the visit. Burt Ek., CMA was able to get the patient set up on a video visit. ? ?Patient location: Home Patient and provider in visit ?Provider location: Office ? ?I discussed the limitations of evaluation and management by telemedicine and the availability of in person appointments. The patient expressed understanding and agreed to proceed. ? ?Visit Date: 02/27/2022 ? ?Today's healthcare provider: Danise Edge, MD  ? ? ? ?Subjective:  ? ? Patient ID: Jesse Jones, male    DOB: 11-17-1980, 41 y.o.   MRN: [id number] ? ?Chief Complaint  ?Patient presents with  ? Depression  ? Anxiety  ? ? ?HPI ?Patient is in today for evaluation of worsening depression and anxiety. He is accompanied by his father and they note he has been worsening over several months. His decline seemed to start when he was forced to change from Adderall to Ritalin due to supply issues. He began having night terrors and has been having trouble sleeping as a result. He has become increasingly anxious and his anhedonia has worsened. He has been hesitant to leave his apartment. He is now staying with his parents after he reached out to them to let them know he was not doing well. He notes fleeting thoughts of suicide but he has no plan and agrees he would reach out for help. He added Melatonin for sleep but that has not helped. Denies CP/palp/SOB/HA/congestion/fevers/GI or GU c/o. Taking meds as prescribed  ? ?Past Medical  History:  ?Diagnosis Date  ? ADD (attention deficit disorder)   ? ADHD (attention deficit hyperactivity disorder) 04/02/2011  ? 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 Anxiety   ? Anxiety and depression 04/02/2011  ? Concussion   ? X 6- 7  ? 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 Depression   ? ED (erectile dysfunction) 05/12/2012  ? Hyperlipidemia, mild 04/18/2015  ? Insomnia   ? 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  ? Outbursts of anger   ? Preventative health care 10/03/2011  ? Tobacco abuse 04/28/2011  ? ? ?Past Surgical History:  ?Procedure Laterality Date  ? NASAL SEPTUM SURGERY Bilateral   ? Dr Jenne Pane 2017  ? punctured lung    ? toe surgeries  during childhood   ? for ingrown toenails  ? ? ?Family History  ?Problem Relation Age of Onset  ? Depression Mother   ? Anxiety disorder Mother   ? Depression Brother   ? Diabetes Brother   ?     type 1  ? Cancer Paternal Grandmother   ?     lung/ didn't smoke  ? Cancer Maternal Uncle 66  ?     colon cancer  ? ? ?Social History  ? ?Socioeconomic History  ? Marital status: Single  ?  Spouse name: Not on file  ? Number of children: Not on file  ? Years of education: Not on file  ? Highest education level: Not on file  ?Occupational History  ? Not on file  ?Tobacco  Use  ? Smoking status: Former  ?  Packs/day: 0.30  ?  Years: 20.00  ?  Pack years: 6.00  ?  Types: Cigarettes  ? Smokeless tobacco: Never  ?Vaping Use  ? Vaping Use: Never used  ?Substance and Sexual Activity  ? Alcohol use: Yes  ?  Alcohol/week: 36.0 standard drinks  ?  Types: 36 Cans of beer per week  ?  Comment: sober x51yr. No alcohol.  ? Drug use: Yes  ?  Types: Marijuana, Other-see comments  ?  Comment: pt stopped 4 year ago  ? Sexual activity: Yes  ?  Partners: Female  ?  Comment: lives alone and eating well. exercising  ?Other Topics Concern  ? Not on file  ?Social History Narrative  ? Not on file  ? ?Social Determinants of Health  ? ?Financial Resource Strain: Not on file  ?Food Insecurity: Not on file   ?Transportation Needs: Not on file  ?Physical Activity: Not on file  ?Stress: Not on file  ?Social Connections: Not on file  ?Intimate Partner Violence: Not on file  ? ? ?Outpatient Medications Prior to Visit  ?Medication Sig Dispense Refill  ? ALPRAZolam (XANAX XR) 3 MG 24 hr tablet Take 1 tablet (3 mg total) by mouth every morning. 30 tablet 3  ? cholecalciferol (VITAMIN D) 1000 UNITS tablet Take 1,000 Units by mouth every morning.    ? clindamycin-benzoyl peroxide (BENZACLIN) gel Apply topically 2 (two) times daily. 25 g 2  ? fish oil-omega-3 fatty acids 1000 MG capsule Take 2 g by mouth daily.    ? fluticasone (FLONASE) 50 MCG/ACT nasal spray PLACE 2 SPRAYS INTO BOTH NOSTRILS DAILY AS NEEDED FOR ALLERGIES OR RHINITIS. 48 mL 2  ? furosemide (LASIX) 20 MG tablet TAKE 1 TABLET BY MOUTH EVERY DAY AS NEEDED 90 tablet 1  ? methocarbamol (ROBAXIN) 500 MG tablet Take 1 tablet (500 mg total) by mouth 2 (two) times daily. 20 tablet 0  ? Multiple Vitamin (MULTIVITAMIN) tablet Take 1 tablet by mouth every morning.    ? tiZANidine (ZANAFLEX) 4 MG capsule Take 1 capsule (4 mg total) by mouth 2 (two) times daily as needed for muscle spasms. 14 capsule 0  ? vitamin B-12 (CYANOCOBALAMIN) 1000 MCG tablet Take 1,000 mcg by mouth every morning.    ? vitamin C (ASCORBIC ACID) 500 MG tablet Take 500 mg by mouth daily.    ? citalopram (CELEXA) 20 MG tablet TAKE 1 TABLET BY MOUTH TWICE A DAY 180 tablet 1  ? methylphenidate (RITALIN) 20 MG tablet Take 1 tablet (20 mg total) by mouth 3 (three) times daily with meals. 90 tablet 0  ? OLANZapine (ZYPREXA) 5 MG tablet TAKE 1 TABLET BY MOUTH EVERYDAY AT BEDTIME 90 tablet 2  ? ?No facility-administered medications prior to visit.  ? ? ?Allergies  ?Allergen Reactions  ? Haloperidol Decanoate   ? Hydroxyzine Anxiety  ?  EXCESSIVE ANXIOUNESS  ? Other Other (See Comments)  ?  Reverse effect, super hyper  ? Penicillins Hives  ? Valium   ? ? ?Review of Systems  ?Constitutional:  Positive for  malaise/fatigue. Negative for fever.  ?HENT:  Negative for congestion.   ?Eyes:  Negative for blurred vision.  ?Respiratory:  Negative for shortness of breath.   ?Cardiovascular:  Negative for chest pain, palpitations and leg swelling.  ?Gastrointestinal:  Negative for abdominal pain, blood in stool and nausea.  ?Genitourinary:  Negative for dysuria and frequency.  ?Musculoskeletal:  Negative for falls.  ?Skin:  Negative for rash.  ?Neurological:  Negative for dizziness, loss of consciousness and headaches.  ?Endo/Heme/Allergies:  Negative for environmental allergies.  ?Psychiatric/Behavioral:  Positive for depression. Negative for substance abuse. The patient is nervous/anxious and has insomnia.   ? ?   ?Objective:  ?  ?Physical Exam ?Constitutional:   ?   General: He is not in acute distress. ?   Appearance: Normal appearance. He is not ill-appearing or toxic-appearing.  ?HENT:  ?   Head: Normocephalic and atraumatic.  ?   Right Ear: External ear normal.  ?   Left Ear: External ear normal.  ?   Nose: Nose normal.  ?Eyes:  ?   General:     ?   Right eye: No discharge.     ?   Left eye: No discharge.  ?Pulmonary:  ?   Effort: Pulmonary effort is normal.  ?Skin: ?   Findings: No rash.  ?Neurological:  ?   Mental Status: He is alert and oriented to person, place, and time.  ?Psychiatric:     ?   Behavior: Behavior normal.  ? ? ?There were no vitals taken for this visit. ?Wt Readings from Last 3 Encounters:  ?01/31/22 196 lb (88.9 kg)  ?07/18/21 200 lb (90.7 kg)  ?01/30/21 194 lb 12.8 oz (88.4 kg)  ? ? ?Diabetic Foot Exam - Simple   ?No data filed ?  ? ?Lab Results  ?Component Value Date  ? WBC 7.5 01/31/2022  ? HGB 15.4 01/31/2022  ? HCT 45.4 01/31/2022  ? PLT 286.0 01/31/2022  ? GLUCOSE 85 01/31/2022  ? CHOL 193 01/31/2022  ? TRIG 113.0 01/31/2022  ? HDL 61.40 01/31/2022  ? LDLCALC 109 (H) 01/31/2022  ? ALT 14 01/31/2022  ? AST 21 01/31/2022  ? NA 140 01/31/2022  ? K 4.4 01/31/2022  ? CL 102 01/31/2022  ? CREATININE  1.19 01/31/2022  ? BUN 12 01/31/2022  ? CO2 30 01/31/2022  ? TSH 1.18 01/31/2022  ? HGBA1C 5.1 01/31/2022  ? ? ?Lab Results  ?Component Value Date  ? TSH 1.18 01/31/2022  ? ?Lab Results  ?Component Value Date  ? WBC 7.

## 2022-03-01 ENCOUNTER — Other Ambulatory Visit (HOSPITAL_BASED_OUTPATIENT_CLINIC_OR_DEPARTMENT_OTHER): Payer: Self-pay

## 2022-03-02 ENCOUNTER — Telehealth: Payer: Medicare Other

## 2022-03-02 ENCOUNTER — Ambulatory Visit (INDEPENDENT_AMBULATORY_CARE_PROVIDER_SITE_OTHER): Payer: Medicare Other | Admitting: *Deleted

## 2022-03-02 ENCOUNTER — Encounter: Payer: Self-pay | Admitting: *Deleted

## 2022-03-02 ENCOUNTER — Telehealth: Payer: Self-pay | Admitting: Family Medicine

## 2022-03-02 DIAGNOSIS — F908 Attention-deficit hyperactivity disorder, other type: Secondary | ICD-10-CM

## 2022-03-02 DIAGNOSIS — F419 Anxiety disorder, unspecified: Secondary | ICD-10-CM

## 2022-03-02 DIAGNOSIS — F3160 Bipolar disorder, current episode mixed, unspecified: Secondary | ICD-10-CM

## 2022-03-02 DIAGNOSIS — R45851 Suicidal ideations: Secondary | ICD-10-CM

## 2022-03-02 DIAGNOSIS — F339 Major depressive disorder, recurrent, unspecified: Secondary | ICD-10-CM

## 2022-03-02 DIAGNOSIS — F17219 Nicotine dependence, cigarettes, with unspecified nicotine-induced disorders: Secondary | ICD-10-CM

## 2022-03-02 DIAGNOSIS — F1011 Alcohol abuse, in remission: Secondary | ICD-10-CM

## 2022-03-02 DIAGNOSIS — F902 Attention-deficit hyperactivity disorder, combined type: Secondary | ICD-10-CM

## 2022-03-02 NOTE — Telephone Encounter (Signed)
Opened in error

## 2022-03-02 NOTE — Telephone Encounter (Signed)
Called pt's father and left vm for him to return call. ?

## 2022-03-02 NOTE — Telephone Encounter (Signed)
Pt and pt's dad contacted office to update blyth on his mental health  ? ?Pt is doing much better. Pt is seeking a psychiatrist and counselor ? ?Pt's dad would like some for someone to contact him back regarding meds. Pt's dad will be going out of town 5/18 and would like for everything to be in order for the pt ? ?Please advise  ?

## 2022-03-02 NOTE — Patient Instructions (Signed)
Visit Information  ? ?Thank you for taking time to visit with me today. Please don't hesitate to contact me if I can be of assistance to you before our next scheduled telephone appointment. ? ?Following are the goals we discussed today:  ?Patient Goals/Self-Care Activities: ?Begin personal counseling with LCSW, on a bi-weekly basis, to reduce and manage symptoms of Severe Depression, until well-established with a community mental health provider.   ?Incorporate into daily practice - relaxation techniques, deep breathing exercises, and mindfulness meditation strategies. ?Please review the following list of counseling agencies and resources, mailed ([place], [place],  [address]) and emailed ([email or web address]) to the following addresses, per your request, by LCSW on 03/02/2022: ?~ List of Medicaid Approved Psychiatrists in [place], [place] ?~ List of Therapists and Psychiatrists in [place], [place] ?~ Therapists in [place] that [place] Medicaid ?~ [place] ?~ [place] ?Contact LCSW directly (# [id number]), if you have questions, need assistance, or if additional social work needs are identified between now and our next scheduled telephone outreach call.  ?Follow-Up:  03/16/2022 at 10:30 am ? ?Please call the care guide team at [phone] if you need to cancel or reschedule your appointment.  ? ?If you are experiencing a Mental Health or [place] or need someone to talk to, please call the Suicide and Crisis Lifeline: 988 ?call the [place] National Suicide Prevention Lifeline: 223-414-3463 or TTY: (913)485-5970 TTY 845-869-1392) to talk to a trained counselor ?call 1-800-273-TALK (toll free, 24 hour hotline) ?go to [facility] [address], [place] [phone]) ?call the [facility]: [phone] ?call 911  ? ?Following is a copy of your full care plan:  ?Care Plan : [place]  ?Updates made by Francis Gaines, LCSW since 03/02/2022 12:00 AM  ?  ? ?Problem: Reduce and Manage My Symptoms of Severe Depression.   ?Priority: High  ?  ? ?Goal: Reduce and Manage My Symptoms of Severe Depression.   ?Start Date: 03/02/2022  ?Expected End Date: 06/01/2022  ?This Visit's Progress: On track  ?Priority: High  ?Note:   ?Current Barriers:   ?Acute Mental Health needs related to Attention Deficit Hyperactivity Disorder, Alcohol Abuse - in Remission, Anxiety, Mixed Bipolar Disorder, Head Injury Due to Trauma, Nicotine Dependence, and Severe Depression, requires Support, Education, Resources, Referrals, Advocacy, and Care Coordination, in order to meet unmet Acute Mental Health needs. ?Clinical Goal(s):  ?Patient will work with LCSW, to reduce and manage symptoms of Severe Depression, until well-established with a community mental health provider.     ?Patient will increase knowledge and/or ability of:  ?      Coping Skills, Healthy Habits, Self-Management Skills, Stress Reduction, Home Safety, and Utilizing [employer] and Resources.   ?Interventions: ?Collaboration with Primary Care Physician, Dr. Penni Homans regarding development and update of comprehensive plan of care, as evidenced by provider attestation and co-signature. ?Inter-disciplinary care team collaboration (see longitudinal plan of care). ?Clinical Interventions:  ?Assessed patient's previous treatment, needs, coping skills, current treatment, support system, and barriers to care. ?PHQ-2 and PHQ-9 Depression Screening Tool performed, and results reviewed with patient. ?Suicidal/Homicidal Assessment completed - none present. ?Mindfulness Meditation Strategies, Relaxation Techniques and Deep Breathing Exercises taught, and encouraged daily. ?Solution-Focused Therapy performed. ?Verbalization of Feelings encouraged. ?Emotional Support provided. ?Problem Solving Solutions developed. ?Cognitive Behavioral Therapy  initiated. ?Quality of Sleep assessed, and Sleep Hygiene Techniques promoted. ?Increase Level of Activity/Exercise emphasized.    ?  Discussed plans with patient for ongoing care management follow-up, and provided patient with direct contact information for care management team. ?Discussed several options for long-term counseling based on need and insurance through Traditional Medicare and Adult Medicaid.  ?Licensed conveyancer of therapists and psychiatrists in [place], [place], as well as [facility] that accept Adult Medicaid.     ?Patient Goals/Self-Care Activities: ?Begin personal counseling with LCSW, on a bi-weekly basis, to reduce and manage symptoms of Severe Depression, until well-established with a community mental health provider.   ?Incorporate into daily practice - relaxation techniques, deep breathing exercises, and mindfulness meditation strategies. ?Please review the following list of counseling agencies and resources, mailed ([place], [place], [place] [address]) and emailed ([email or web address]) to the following addresses, per your request, by LCSW on 03/02/2022: ?~ List of Medicaid Approved Psychiatrists in [place], [place] ?~ List of Therapists and Psychiatrists in [place], [place] ?~ Therapists in [place] that [place] Medicaid ?~ [place] ?~ [place] ?Contact LCSW directly (# [id number]), if you have questions, need assistance, or if additional social work needs are identified between now and our next scheduled telephone outreach call.  ?Follow-Up:  03/16/2022 at 10:30 am ?  ? ? ?Consent to CCM Services: ?Mr. Hauk was given information about Chronic Care Management services including:  ?CCM service includes personalized support from designated clinical staff supervised by his physician, including individualized plan of care and coordination with other care providers ?24/7 contact phone numbers for assistance for urgent and  routine care needs. ?Service will only be billed when office clinical staff spend 20 minutes or more in a month to coordinate care. ?Only one practitioner may furnish and bill the service in a calendar month. ?The patient may stop CCM services at any time (effective at the end of the month) by phone call to the office staff. ?The patient will be responsible for cost sharing (co-pay) of up to 20% of the service fee (after annual deductible is met). ? ?Patient agreed to services and verbal consent obtained.  ? ?Patient verbalizes understanding of instructions and care plan provided today and agrees to view in [place]. Active MyChart status confirmed with patient.   ? ?Telephone follow up appointment with care management team member scheduled for:  03/16/2022 at 10:30 am ? ?Di Kindle Kaien Pezzullo LCSW ?Licensed Clinical Social Worker ?[place]

## 2022-03-02 NOTE — Chronic Care Management (AMB) (Signed)
?Chronic Care Management  ? ? Clinical Social Work Note ? ?03/02/2022 ?Name: ESSEX HABA MRN: [id number] DOB: 19-Feb-1981 ? ?ROEMELLO LANGBEHN is a 41 y.o. year old male who is a primary care patient of Mosie Lukes, MD. The CCM team was consulted to assist the patient with chronic disease management and/or care coordination needs related to: Appointment Scheduling Needs, [employer], and Mental Health Counseling and Resources.  ? ?Engaged with patient by telephone for initial visit in response to provider referral for social work chronic care management and care coordination services.  ? ?Consent to Services:  ?The patient was given information about Chronic Care Management services, agreed to services, and gave verbal consent prior to initiation of services.  Please see initial visit note for detailed documentation.  ? ?Patient agreed to services and consent obtained.  ? ?Assessment: Review of patient past medical history, allergies, medications, and health status, including review of relevant consultants reports was performed today as part of a comprehensive evaluation and provision of chronic care management and care coordination services.    ? ?SDOH (Social Determinants of Health) assessments and interventions performed:  ?SDOH Interventions   ? ?Flowsheet Row Most Recent Value  ?SDOH Interventions   ?Food Insecurity Interventions Intervention Not Indicated  ?Financial Strain Interventions Intervention Not Indicated  ?Housing Interventions Intervention Not Indicated  ?Intimate Partner Violence Interventions Intervention Not Indicated  ?Physical Activity Interventions Patient Refused  [Offered resources]  ?Stress Interventions Offered [employer], Provide Counseling, Other (Comment)  [Provide Counseling Resources]  ?Social Connections Interventions Patient Refused  [Offered resources]  ?Transportation Interventions Intervention Not Indicated  ?Depression Interventions/Treatment   Referral to Psychiatry, Medication, Counseling  [Patient denies suicidal or homicidal ideations.  Patient is not interested in inpatient psychiatric treatment services.]  ? ?  ?  ? ?Advanced Directives Status: Not ready or willing to discuss. ? ?CCM Care Plan ? ?Allergies  ?Allergen Reactions  ? Haloperidol Decanoate   ? Hydroxyzine Anxiety  ?  EXCESSIVE ANXIOUNESS  ? Other Other (See Comments)  ?  Reverse effect, super hyper  ? Penicillins Hives  ? Valium   ? ? ?Outpatient Encounter Medications as of 03/02/2022  ?Medication Sig  ? ALPRAZolam (XANAX XR) 3 MG 24 hr tablet Take 1 tablet (3 mg total) by mouth every morning.  ? cholecalciferol (VITAMIN D) 1000 UNITS tablet Take 1,000 Units by mouth every morning.  ? citalopram (CELEXA) 20 MG tablet Take 1 tablet (20 mg total) by mouth daily.  ? clindamycin-benzoyl peroxide (BENZACLIN) gel Apply topically 2 (two) times daily.  ? fish oil-omega-3 fatty acids 1000 MG capsule Take 2 g by mouth daily.  ? fluticasone (FLONASE) 50 MCG/ACT nasal spray PLACE 2 SPRAYS INTO BOTH NOSTRILS DAILY AS NEEDED FOR ALLERGIES OR RHINITIS.  ? furosemide (LASIX) 20 MG tablet TAKE 1 TABLET BY MOUTH EVERY DAY AS NEEDED  ? lisdexamfetamine (VYVANSE) 50 MG capsule Take 1 capsule (50 mg total) by mouth daily.  ? methocarbamol (ROBAXIN) 500 MG tablet Take 1 tablet (500 mg total) by mouth 2 (two) times daily.  ? Multiple Vitamin (MULTIVITAMIN) tablet Take 1 tablet by mouth every morning.  ? OLANZapine (ZYPREXA) 10 MG tablet Take 1 tablet (10 mg total) by mouth at bedtime.  ? tiZANidine (ZANAFLEX) 4 MG capsule Take 1 capsule (4 mg total) by mouth 2 (two) times daily as needed for muscle spasms.  ? vitamin B-12 (CYANOCOBALAMIN) 1000 MCG tablet Take 1,000 mcg by mouth every morning.  ? vitamin C (ASCORBIC  ACID) 500 MG tablet Take 500 mg by mouth daily.  ? ?No facility-administered encounter medications on file as of 03/02/2022.  ? ? ?Patient Active Problem List  ? Diagnosis Date Noted  ? Ingrown toenail  of right foot 07/19/2021  ? LOM (left otitis media) 01/30/2021  ? Hyperglycemia 06/28/2020  ? Head injury due to trauma 03/02/2020  ? Pedal edema 08/26/2019  ? Impetigo 02/03/2019  ? Constipation 12/07/2018  ? Nasal turbinate hypertrophy 07/27/2016  ? 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  ? Hyperlipidemia, mild 04/18/2015  ? Allergic state 02/17/2014  ? 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 Bipolar disorder, mixed ([place]) 02/05/2013  ? Tachycardia 07/07/2012  ? ED (erectile dysfunction) 05/12/2012  ? Preventative health care 10/03/2011  ? Nicotine dependence 04/28/2011  ? ADHD (attention deficit hyperactivity disorder) 04/02/2011  ? Anxiety 04/02/2011  ? 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 ? ?Conditions to be addressed/monitored: Anxiety, Depression, and Bipolar Disorder.  Limited Social Support, Mental Health Concerns, Family and Relationship Dysfunction, Substance Abuse Issues, Social Isolation, and Lacks Knowledge of [employer]. ? ?Care Plan : LCSW Plan of Care  ?Updates made by Francis Gaines, LCSW since 03/02/2022 12:00 AM  ?  ? ?Problem: Reduce and Manage My Symptoms of Severe Depression.   ?Priority: High  ?  ? ?Goal: Reduce and Manage My Symptoms of Severe Depression.   ?Start Date: 03/02/2022  ?Expected End Date: 06/01/2022  ?This Visit's Progress: On track  ?Priority: High  ?Note:   ?Current Barriers:   ?Acute Mental Health needs related to Attention Deficit Hyperactivity Disorder, Alcohol Abuse - in Remission, Anxiety, Mixed Bipolar Disorder, Head Injury Due to Trauma, Nicotine Dependence, and Severe Depression, requires Support, Education, Resources, Referrals, Advocacy, and Care Coordination, in order to meet unmet Acute Mental Health needs. ?Clinical Goal(s):  ?Patient will work with LCSW, to reduce and manage symptoms of Severe Depression, until well-established with a community mental health provider.     ?Patient will increase knowledge and/or ability of:  ?      Coping Skills, Healthy  Habits, Self-Management Skills, Stress Reduction, Home Safety, and Utilizing [employer] and Resources.   ?Interventions: ?Collaboration with Primary Care Physician, Dr. Penni Homans regarding development and update of comprehensive plan of care, as evidenced by provider attestation and co-signature. ?Inter-disciplinary care team collaboration (see longitudinal plan of care). ?Clinical Interventions:  ?Assessed patient's previous treatment, needs, coping skills, current treatment, support system, and barriers to care. ?PHQ-2 and PHQ-9 Depression Screening Tool performed, and results reviewed with patient. ?Suicidal/Homicidal Assessment completed - none present. ?Mindfulness Meditation Strategies, Relaxation Techniques and Deep Breathing Exercises taught, and encouraged daily. ?Solution-Focused Therapy performed. ?Verbalization of Feelings encouraged. ?Emotional Support provided. ?Problem Solving Solutions developed. ?Cognitive Behavioral Therapy initiated. ?Quality of Sleep assessed, and Sleep Hygiene Techniques promoted. ?Increase Level of Activity/Exercise emphasized.    ?Discussed plans with patient for ongoing care management follow-up, and provided patient with direct contact information for care management team. ?Discussed several options for long-term counseling based on need and insurance through Traditional Medicare and Adult Medicaid.  ?Licensed conveyancer of therapists and psychiatrists in [place], [place], as well as [facility], that accept Adult Medicaid.     ?Patient Goals/Self-Care Activities: ?Begin personal counseling with LCSW, on a bi-weekly basis, to reduce and manage symptoms of Severe Depression, until well-established with a community mental health provider.   ?Incorporate into daily practice - relaxation techniques, deep breathing exercises, and mindfulness meditation strategies. ?Please review the following list of counseling  agencies and resources, mailed ([place], [place], [place] [address]) and emailed ([email or web address]) to the following addresses, per your request, by LCSW on 03/02/2022: ?~ List of Medicaid Approved Psychiatrists in [place], [place] ?~ List of [place]

## 2022-03-06 ENCOUNTER — Other Ambulatory Visit (HOSPITAL_BASED_OUTPATIENT_CLINIC_OR_DEPARTMENT_OTHER): Payer: Self-pay

## 2022-03-06 ENCOUNTER — Other Ambulatory Visit: Payer: Self-pay | Admitting: Family Medicine

## 2022-03-06 MED ORDER — ALPRAZOLAM ER 3 MG PO TB24
3.0000 mg | ORAL_TABLET | Freq: Every morning | ORAL | 0 refills | Status: DC
  Filled 2022-03-06 – 2022-03-16 (×3): qty 30, 30d supply, fill #0

## 2022-03-06 MED ORDER — LISDEXAMFETAMINE DIMESYLATE 50 MG PO CAPS
50.0000 mg | ORAL_CAPSULE | Freq: Every day | ORAL | 0 refills | Status: DC
  Filled 2022-03-06 – 2022-03-13 (×5): qty 30, 30d supply, fill #0

## 2022-03-06 NOTE — Telephone Encounter (Signed)
Spoke with pt's father.  Him and pt's mother are going to be leaving the state for a couple weeks on the 18th and he wants to make sure [name] has his medications.  [name] will not be able to drive to the pharmacy so dad would like for them to be sent to the pharm a little early so he can pick them up for him. Pt's dad said that he seems to be doing very well on the Vyvanse so far.  If this is okay, he would like #30 of Vyvanse and a refill of Xanax sent to the downstairs pharmacy as they are cheaper, to be picked up by 5/17 at the absolute latest. Due to cost, they only picked up #15 of the #30 Vyvanse previously sent.  Please advise if you are willing to do this. ?

## 2022-03-08 ENCOUNTER — Other Ambulatory Visit (HOSPITAL_BASED_OUTPATIENT_CLINIC_OR_DEPARTMENT_OTHER): Payer: Self-pay

## 2022-03-10 ENCOUNTER — Ambulatory Visit (HOSPITAL_COMMUNITY)
Admission: RE | Admit: 2022-03-10 | Discharge: 2022-03-10 | Disposition: A | Discharge status: Home / Self Care | Payer: Medicare Other | Attending: Psychiatry | Admitting: Psychiatry

## 2022-03-10 NOTE — BH Assessment (Signed)
Patient is a 41 year old male that presents this date after contacting his MD's office Penni Homans MD) and reached her on call service that suggested patient present to [facility] after patient voiced that his depressive symptoms have returned. Patient states he "doesn't want to fall back into that cycle" referring to him "sleeping all day" which was problematic around the Holidays last year prior to starting his Celexa. Patient denies any S/I, H/I or AVH this date. Patient denies any current SA issues reporting that he has been maintaining his sobriety from alcohol and cannabis for over 8 years. Patient has been diagnosed with MDD, ADHD and GAD. Patient has been receiving OP services from above provider and is prescribed multiple medications for symptom management. See MAR. Patient reports current compliance and states after contacting the on call service today his next medication management appointment has been moved up from 5/19 to 5/15. Patient states he currently does not have a therapist although reports interest in locating one. Patient states he will ask his provider on Monday 5/15 for a referral. Patient will be provided resources also this date to assist with ongoing care.    ?

## 2022-03-10 NOTE — H&P (Signed)
Behavioral Health Medical Screening Exam ? ?Jesse Jones is a 41 y.o. male with a past psychiatric history significant for possible bipolar, depression, ADD/ADHD, and anxiety who presents to [facility] as a walk-in due to worsening depression, anxiety, and paranoia.  Patient was brought to [facility] by his father. ? ?Patient reports that he has been severely depressed, anxious, and paranoid.  He reports that his depression has been going on for 6 months and denies any recent major events or triggers to his depression.  He reports that his depression is alleviated through interacting with his parents while at their home.  He reports that he tried to go back to his home but then became extremely depressed, anxious, and paranoid.  Patient was referred to [facility] by Mardene Celeste, a licensed clinical social worker that works with his psychiatric provider, Dr. Rogelia Rohrer.  Patient attends [facility] and receives psychiatric care through Dr. Rogelia Rohrer.  Patient reports that he has been with Dr. Rogelia Rohrer for a little over 10 years but is unsure of the exact amount of time due to having issues with his long-term memory.  Patient endorses the following depressive symptoms: low mood, decreased concentration, lack of motivation, decreased energy, feelings of guilt/worthlessness, hopelessness, and changes in sleeping patterns.  Patient reports that he is unable to sleep and often deals with cramping and cold sweats while trying to sleep. ? ?Patient endorses elevated anxiety and rates his anxiety at a 6 out of 10.  He reports that his medications tend to keep his anxiety at bay but if he does not take them, his anxiety can reach levels up to 9 out of 10.  Patient's main stressor involves not getting enough sleep stating that he receives virtually no sleep the previous night.  Patient states that his paranoia is pretty bad and is characterized by the thought that people are out to get him.  Patient states that the  alprazolam use is normally helpful in managing his paranoia but lately it has proven ineffective.  Patient states that his paranoia occurs when he is mostly anxious.  Patient is currently taking the following medications Celexa, alprazolam, olanzapine, and Vyvanse.  Patient was recently placed on Vyvanse as a substitute stimulant due to the stimulant shortage.  Patient endorses a past history of hospitalization due to mental health and states that he was hospitalized at [facility] at 16 due to substance abuse, depression, and anxiety.  Patient also has a history of suicide attempt stating that he attempted between the age of 60 and 66.  During his attempt, patient states that he drank gasoline, a pint of bourbon, and overdosed on pills.  Patient denies a past history of self-harm. ? ?Patient denies suicidal or homicidal ideations.  He further denies auditory or visual hallucinations and does not appear to be responding to internal/external stimuli.  Patient continues to endorse paranoia.  Patient endorses poor sleep.  Patient endorses poor appetite and eats on average 1-2 meals per day.  He reports that he tends to eat better when he is with his parents.  Patient denies alcohol consumption, tobacco use, and illicit drug use.  Patient denies being a danger to himself but believes that his mood could potentially be.  Patient is able to contract for safety following the conclusion of the encounter and states that his family will help to keep him safe. ? ?Patient is interested in receiving resources for outpatient therapy.  Patient is also interested and enrolling and intensive  outpatient services through [facility]. ? ?Total Time spent with patient: 15 minutes ? ?Psychiatric Specialty Exam: ? ?Presentation  ?General Appearance: Well Groomed; Appropriate for Environment ? ?Eye Contact:Good ? ?Speech:Clear and Coherent; Normal Rate ? ?Speech Volume:Normal ? ?Handedness:Right ? ? ?Mood  and Affect  ?Mood:Anxious; Depressed ? ?Affect:Congruent; Depressed ? ? ?Thought Process  ?Thought Processes:Coherent; Goal Directed; Linear ? ?Descriptions of Associations:Intact ? ?Orientation:Full (Time, Place and Person) ? ?Thought Content:WDL ? ?History of Schizophrenia/Schizoaffective disorder:No data recorded ?Duration of Psychotic Symptoms:No data recorded ?Hallucinations:Hallucinations: None ? ?Ideas of Reference:Paranoia ? ?Suicidal Thoughts:Suicidal Thoughts: No ? ?Homicidal Thoughts:Homicidal Thoughts: No ? ? ?Sensorium  ?Memory:Immediate Good; Recent Good; Remote Fair ? ?Judgment:Good ? ?Insight:Good ? ? ?Executive Functions  ?Concentration:Good ? ?Attention Span:Good ? ?Recall:Good ? ?Fund of Knowledge:Good ? ?Language:Good ? ? ?Psychomotor Activity  ?Psychomotor Activity:Psychomotor Activity: Normal ? ? ?Assets  ?Assets:Communication Skills; Desire for Improvement; Housing; Health and safety inspector; Social Support ? ? ?Sleep  ?Sleep:Sleep: Poor ? ? ? ?Physical Exam: ?Physical Exam ?Psychiatric:     ?   Attention and Perception: Attention and perception normal. He does not perceive auditory or visual hallucinations.     ?   Mood and Affect: Affect normal. Mood is anxious and depressed.     ?   Speech: Speech normal. He is communicative. Speech is not tangential.     ?   Behavior: Behavior normal. Behavior is not agitated or combative. Behavior is cooperative.     ?   Thought Content: Thought content is paranoid. Thought content does not include homicidal or suicidal ideation. Thought content does not include suicidal plan.     ?   Cognition and Memory: Cognition and memory normal.     ?   Judgment: Judgment normal.  ? ?Review of Systems  ?Psychiatric/Behavioral:  Positive for depression. Negative for hallucinations, memory loss, substance abuse and suicidal ideas. The patient is nervous/anxious and has insomnia.   ?There were no vitals taken for this visit. There is no height or weight on file to  calculate BMI. ? ?Musculoskeletal: ?Strength & Muscle Tone: within normal limits ?Gait & Station: normal ?Patient leans: N/A ? ? ?Recommendations: ? ?Based on my evaluation the patient does not appear to have an emergency medical condition.  During the assessment, patient denied suicidal or homicidal ideations.  He further denied auditory or visual hallucinations and did not appear to be responding to internal/external stimuli.  Although patient feels that his mood is worsening he denies being a danger to himself and is able to contract for safety.  Patient ensured provider that his family could keep him safe if discharged from facility. ? ?Patient showed interest in receiving resources for outpatient therapy.  Patient is also interested in intensive outpatient services through [facility].  Provider will provide referral for patient for intensive outpatient.  Patient to be given resources following the conclusion of the encounter. ? ?Meta Hatchet, PA ?03/10/2022, 1:30 PM ? ?

## 2022-03-12 ENCOUNTER — Ambulatory Visit: Payer: Medicare Other | Admitting: *Deleted

## 2022-03-12 DIAGNOSIS — F3160 Bipolar disorder, current episode mixed, unspecified: Secondary | ICD-10-CM

## 2022-03-12 DIAGNOSIS — F902 Attention-deficit hyperactivity disorder, combined type: Secondary | ICD-10-CM

## 2022-03-12 DIAGNOSIS — Q681 Congenital deformity of finger(s) and hand: Secondary | ICD-10-CM

## 2022-03-12 DIAGNOSIS — F339 Major depressive disorder, recurrent, unspecified: Secondary | ICD-10-CM

## 2022-03-12 DIAGNOSIS — R45851 Suicidal ideations: Secondary | ICD-10-CM

## 2022-03-12 DIAGNOSIS — F17219 Nicotine dependence, cigarettes, with unspecified nicotine-induced disorders: Secondary | ICD-10-CM

## 2022-03-12 DIAGNOSIS — F419 Anxiety disorder, unspecified: Secondary | ICD-10-CM

## 2022-03-12 DIAGNOSIS — F1011 Alcohol abuse, in remission: Secondary | ICD-10-CM

## 2022-03-12 DIAGNOSIS — F908 Attention-deficit hyperactivity disorder, other type: Secondary | ICD-10-CM

## 2022-03-12 DIAGNOSIS — S0990XA Unspecified injury of head, initial encounter: Secondary | ICD-10-CM

## 2022-03-12 NOTE — Chronic Care Management (AMB) (Signed)
?Chronic Care Management  ? ? Clinical Social Work Note ? ?03/12/2022 ?Name: Jesse Jones MRN: [id number] DOB: [birth year] ? ?VIAAN KNIPPENBERG is a 41 y.o. year old male who is a primary care patient of Bradd Canary, MD. The CCM team was consulted to assist the patient with chronic disease management and/or care coordination needs related to: Appointment Scheduling Needs, [facility], and Mental Health Counseling and Resources.  ? ?Engaged with patient by telephone for follow up visit in response to provider referral for social work chronic care management and care coordination services.  ? ?Consent to Services:  ?The patient was given information about Chronic Care Management services, agreed to services, and gave verbal consent prior to initiation of services.  Please see initial visit note for detailed documentation.  ? ?Patient agreed to services and consent obtained.  ? ?Assessment: Review of patient past medical history, allergies, medications, and health status, including review of relevant consultants reports was performed today as part of a comprehensive evaluation and provision of chronic care management and care coordination services.    ? ?SDOH (Social Determinants of Health) assessments and interventions performed:   ? ?Advanced Directives Status: Not addressed in this encounter. ? ?CCM Care Plan ? ?Allergies  ?Allergen Reactions  ? Haloperidol Decanoate   ? Hydroxyzine Anxiety  ?  EXCESSIVE ANXIOUNESS  ? Other Other (See Comments)  ?  Reverse effect, super hyper  ? Penicillins Hives  ? Valium   ? ? ?Outpatient Encounter Medications as of 03/12/2022  ?Medication Sig  ? ALPRAZolam (XANAX XR) 3 MG 24 hr tablet Take 1 tablet (3 mg total) by mouth every morning.  ? cholecalciferol (VITAMIN D) 1000 UNITS tablet Take 1,000 Units by mouth every morning.  ? citalopram (CELEXA) 20 MG tablet Take 1 tablet (20 mg total) by mouth daily.  ? clindamycin-benzoyl peroxide (BENZACLIN) gel Apply topically 2  (two) times daily.  ? fish oil-omega-3 fatty acids 1000 MG capsule Take 2 g by mouth daily.  ? fluticasone (FLONASE) 50 MCG/ACT nasal spray PLACE 2 SPRAYS INTO BOTH NOSTRILS DAILY AS NEEDED FOR ALLERGIES OR RHINITIS.  ? furosemide (LASIX) 20 MG tablet TAKE 1 TABLET BY MOUTH EVERY DAY AS NEEDED  ? lisdexamfetamine (VYVANSE) 50 MG capsule Take 1 capsule (50 mg total) by mouth daily.  ? methocarbamol (ROBAXIN) 500 MG tablet Take 1 tablet (500 mg total) by mouth 2 (two) times daily.  ? Multiple Vitamin (MULTIVITAMIN) tablet Take 1 tablet by mouth every morning.  ? OLANZapine (ZYPREXA) 10 MG tablet Take 1 tablet (10 mg total) by mouth at bedtime.  ? tiZANidine (ZANAFLEX) 4 MG capsule Take 1 capsule (4 mg total) by mouth 2 (two) times daily as needed for muscle spasms.  ? vitamin B-12 (CYANOCOBALAMIN) 1000 MCG tablet Take 1,000 mcg by mouth every morning.  ? vitamin C (ASCORBIC ACID) 500 MG tablet Take 500 mg by mouth daily.  ? ?No facility-administered encounter medications on file as of 03/12/2022.  ? ? ?Patient Active Problem List  ? Diagnosis Date Noted  ? Ingrown toenail of right foot 07/19/2021  ? LOM (left otitis media) 01/30/2021  ? Hyperglycemia 06/28/2020  ? Head injury due to trauma 03/02/2020  ? Pedal edema 08/26/2019  ? Impetigo 02/03/2019  ? Constipation 12/07/2018  ? Nasal turbinate hypertrophy 07/27/2016  ? 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  ? Hyperlipidemia, mild 04/18/2015  ? Allergic state 02/17/2014  ? 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 Bipolar disorder, mixed (HCC) 02/05/2013  ? Tachycardia  07/07/2012  ? ED (erectile dysfunction) 05/12/2012  ? Preventative health care 10/03/2011  ? Nicotine dependence 04/28/2011  ? ADHD (attention deficit hyperactivity disorder) 04/02/2011  ? Anxiety 04/02/2011  ? 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 ? ?Conditions to be addressed/monitored: Attention Deficit Hyperactivity Disorder, Alcohol Abuse - in Remission, Anxiety, Mixed Bipolar Disorder, Head Injury  Due to Trauma, Nicotine Dependence, and Severe Depression.  Limited Social Support, Mental Health Concerns, Social Isolation, and Lacks Knowledge of [facility]. ? ?Care Plan : LCSW Plan of Care  ?Updates made by Karolee Stamps, LCSW since 03/12/2022 12:00 AM  ?  ? ?Problem: Reduce and Manage My Symptoms of Severe Depression.   ?Priority: High  ?  ? ?Goal: Reduce and Manage My Symptoms of Severe Depression.   ?Start Date: 03/02/2022  ?Expected End Date: 06/01/2022  ?This Visit's Progress: On track  ?Recent Progress: On track  ?Priority: High  ?Note:   ?Current Barriers:   ?Acute Mental Health needs related to Attention Deficit Hyperactivity Disorder, Alcohol Abuse - in Remission, Anxiety, Mixed Bipolar Disorder, Head Injury Due to Trauma, Nicotine Dependence, and Severe Depression, requires Support, Education, Resources, Referrals, Advocacy, and Care Coordination, in order to meet unmet Acute Mental Health needs. ?Clinical Goal(s):  ?Patient will work with LCSW, to reduce and manage symptoms of Severe Depression, until well-established with a community mental health provider.     ?Patient will increase knowledge and/or ability of:  ?      Coping Skills, Healthy Habits, Self-Management Skills, Stress Reduction, Home Safety, and Utilizing [employer] and Resources.   ?Interventions: ?Collaboration with Primary Care Physician, Dr. Danise Edge regarding development and update of comprehensive plan of care, as evidenced by provider attestation and co-signature. ?Inter-disciplinary care team collaboration (see longitudinal plan of care). ?Clinical Interventions:  ?Mindfulness Meditation Strategies, Relaxation Techniques and Deep Breathing Exercises reviewed, and encouraged daily. ?Solution-Focused Therapy performed. ?Verbalization of Feelings encouraged. ?Emotional Support provided. ?Problem Solving Solutions developed. ?Cognitive Behavioral Therapy initiated. ?Increase Level of Activity/Exercise  emphasized.    ?Patient Goals/Self-Care Activities: ?Continue to receive personal counseling with LCSW, on a weekly basis, to reduce and manage symptoms of Severe Depression, until well-established with a community mental health provider.   ?Continue to incorporate into daily practice - relaxation techniques, deep breathing exercises, and mindfulness meditation strategies. ?Thorough review of the following list of counseling agencies and resources, in an effort to establish services: ?~ List of Medicaid Approved Psychiatrists in [place], [place] ?~ List of Therapists and Psychiatrists in [place], [place] ?~ Therapists in [place] that Accept Medicaid ?~ Mental Health Resources ?~ Mental Health & Substance Abuse Resources ?LCSW collaboration with Primary Care Physician, Dr. Danise Edge to request review of current psychotropic medication regimen. ? ~ Request increase dosage of Alprazolam (Xanax XR) from 3 mg to 4 mg, po, am, daily. ? ~ Request additional dosage of Alprazolam (Xanax) 2 mg, po, pm, daily. ?Collaboration with Primary Care Physician, Dr. Danise Edge to schedule follow-up appointment. ?Contact LCSW directly (# [phone]), if you have questions, need assistance, or if additional social work needs are identified between now and our next scheduled telephone outreach call.  ?Follow-Up:  03/16/2022 at 10:30 am ?  ?Mardene Celeste Teyonna Plaisted LCSW ?Licensed Clinical Social Worker ?[facility] [place] ?[phone]  ? ?

## 2022-03-12 NOTE — Patient Instructions (Signed)
Visit Information ? ?Thank you for taking time to visit with me today. Please don't hesitate to contact me if I can be of assistance to you before our next scheduled telephone appointment. ? ?Following are the goals we discussed today:  ?Patient Goals/Self-Care Activities: ?Continue to receive personal counseling with LCSW, on a weekly basis, to reduce and manage symptoms of Severe Depression, until well-established with a community mental health provider.   ?Continue to incorporate into daily practice - relaxation techniques, deep breathing exercises, and mindfulness meditation strategies. ?Thorough review of the following list of counseling agencies and resources, in an effort to establish services: ?~ List of Medicaid Approved Psychiatrists in [place], [place] ?~ List of Therapists and Psychiatrists in [place], [place] ?~ Therapists in [place] that Accept Medicaid ?~ Mental Health Resources ?~ Mental Health & Substance Abuse Resources ?LCSW collaboration with Primary Care Physician, Dr. Danise Edge to request review of current psychotropic medication regimen. ? ~ Request increase dosage of Alprazolam (Xanax XR) from 3 mg to 4 mg, po, am, daily. ? ~ Request additional dosage of Alprazolam (Xanax) 2 mg, po, pm, daily. ?Collaboration with Primary Care Physician, Dr. Danise Edge to schedule follow-up appointment. ?Contact LCSW directly (# [phone]), if you have questions, need assistance, or if additional social work needs are identified between now and our next scheduled telephone outreach call.  ?Follow-Up:  03/16/2022 at 10:30 am ? ?Please call the care guide team at [phone] if you need to cancel or reschedule your appointment.  ? ?If you are experiencing a Mental Health or Behavioral Health Crisis or need someone to talk to, please call the Suicide and Crisis Lifeline: 988 ?call the [place] National Suicide Prevention Lifeline: (309)612-2079 or TTY: (412) 590-9655 TTY 240-857-3648) to talk to a  trained counselor ?call 1-800-273-TALK (toll free, 24 hour hotline) ?go to [facility] [address] [place] [phone]) ?call the [facility]: [phone] ?call 911  ? ?Patient verbalizes understanding of instructions and care plan provided today and agrees to view in MyChart. Active MyChart status confirmed with patient.   ? ?Danford Bad LCSW ?Licensed Clinical Social Worker ?[facility] [place] ?[phone]  ?

## 2022-03-13 ENCOUNTER — Other Ambulatory Visit (HOSPITAL_BASED_OUTPATIENT_CLINIC_OR_DEPARTMENT_OTHER): Payer: Self-pay

## 2022-03-13 ENCOUNTER — Telehealth: Payer: Self-pay | Admitting: Family Medicine

## 2022-03-13 NOTE — Telephone Encounter (Signed)
Pt called stating that he was wondering if upping his ALPRAZolam to two a day instead of one would be ok for his course of treatment. Please Advise. ?

## 2022-03-14 ENCOUNTER — Other Ambulatory Visit (HOSPITAL_BASED_OUTPATIENT_CLINIC_OR_DEPARTMENT_OTHER): Payer: Self-pay

## 2022-03-14 ENCOUNTER — Encounter: Payer: Self-pay | Admitting: *Deleted

## 2022-03-14 NOTE — Telephone Encounter (Signed)
Talked to [facility], he verbalized understanding. He said he will be talking to his therapist on Friday in regards to the physiatrist and will let us know if he needs anything. ?

## 2022-03-14 NOTE — Telephone Encounter (Signed)
Pt called for update ? ?Informed him awaiting provider's response  ? ?Pt's dad: [phone] House: [phone] ?

## 2022-03-15 ENCOUNTER — Other Ambulatory Visit (HOSPITAL_BASED_OUTPATIENT_CLINIC_OR_DEPARTMENT_OTHER): Payer: Self-pay

## 2022-03-16 ENCOUNTER — Other Ambulatory Visit (HOSPITAL_BASED_OUTPATIENT_CLINIC_OR_DEPARTMENT_OTHER): Payer: Self-pay

## 2022-03-16 ENCOUNTER — Ambulatory Visit: Payer: Medicare Other | Admitting: *Deleted

## 2022-03-16 DIAGNOSIS — F3160 Bipolar disorder, current episode mixed, unspecified: Secondary | ICD-10-CM

## 2022-03-16 DIAGNOSIS — S0990XA Unspecified injury of head, initial encounter: Secondary | ICD-10-CM

## 2022-03-16 DIAGNOSIS — F908 Attention-deficit hyperactivity disorder, other type: Secondary | ICD-10-CM

## 2022-03-16 DIAGNOSIS — F17219 Nicotine dependence, cigarettes, with unspecified nicotine-induced disorders: Secondary | ICD-10-CM

## 2022-03-16 DIAGNOSIS — F339 Major depressive disorder, recurrent, unspecified: Secondary | ICD-10-CM

## 2022-03-16 DIAGNOSIS — F419 Anxiety disorder, unspecified: Secondary | ICD-10-CM

## 2022-03-16 DIAGNOSIS — F902 Attention-deficit hyperactivity disorder, combined type: Secondary | ICD-10-CM

## 2022-03-16 DIAGNOSIS — R000 Tachycardia, unspecified: Secondary | ICD-10-CM

## 2022-03-16 DIAGNOSIS — R45851 Suicidal ideations: Secondary | ICD-10-CM

## 2022-03-16 DIAGNOSIS — F1011 Alcohol abuse, in remission: Secondary | ICD-10-CM

## 2022-03-16 NOTE — Chronic Care Management (AMB) (Signed)
Chronic Care Management    Clinical Social Work Note  03/16/2022 Name: Jesse Jones MRN: [id number] DOB: 1981-10-23  Jesse Jones is a 41 y.o. year old male who is a primary care patient of Bradd Canary, MD. The CCM team was consulted to assist the patient with chronic disease management and/or care coordination needs related to: Appointment Scheduling Needs, [facility], and Mental Health Counseling and Resources.   Engaged with patient by telephone for follow up visit in response to provider referral for social work chronic care management and care coordination services.   Consent to Services:  The patient was given information about Chronic Care Management services, agreed to services, and gave verbal consent prior to initiation of services.  Please see initial visit note for detailed documentation.   Patient agreed to services and consent obtained.   Assessment: Review of patient past medical history, allergies, medications, and health status, including review of relevant consultants reports was performed today as part of a comprehensive evaluation and provision of chronic care management and care coordination services.     SDOH (Social Determinants of Health) assessments and interventions performed:    Advanced Directives Status: Not addressed in this encounter.  CCM Care Plan  Allergies  Allergen Reactions   Haloperidol Decanoate    Hydroxyzine Anxiety    EXCESSIVE ANXIOUNESS   Other Other (See Comments)    Reverse effect, super hyper   Penicillins Hives   Valium     Outpatient Encounter Medications as of 03/16/2022  Medication Sig   ALPRAZolam (XANAX XR) 3 MG 24 hr tablet Take 1 tablet (3 mg total) by mouth every morning.   cholecalciferol (VITAMIN D) 1000 UNITS tablet Take 1,000 Units by mouth every morning.   citalopram (CELEXA) 20 MG tablet Take 1 tablet (20 mg total) by mouth daily.   clindamycin-benzoyl peroxide (BENZACLIN) gel Apply topically 2  (two) times daily.   fish oil-omega-3 fatty acids 1000 MG capsule Take 2 g by mouth daily.   fluticasone (FLONASE) 50 MCG/ACT nasal spray PLACE 2 SPRAYS INTO BOTH NOSTRILS DAILY AS NEEDED FOR ALLERGIES OR RHINITIS.   furosemide (LASIX) 20 MG tablet TAKE 1 TABLET BY MOUTH EVERY DAY AS NEEDED   lisdexamfetamine (VYVANSE) 50 MG capsule Take 1 capsule (50 mg total) by mouth daily.   methocarbamol (ROBAXIN) 500 MG tablet Take 1 tablet (500 mg total) by mouth 2 (two) times daily.   Multiple Vitamin (MULTIVITAMIN) tablet Take 1 tablet by mouth every morning.   OLANZapine (ZYPREXA) 10 MG tablet Take 1 tablet (10 mg total) by mouth at bedtime.   tiZANidine (ZANAFLEX) 4 MG capsule Take 1 capsule (4 mg total) by mouth 2 (two) times daily as needed for muscle spasms.   vitamin B-12 (CYANOCOBALAMIN) 1000 MCG tablet Take 1,000 mcg by mouth every morning.   vitamin C (ASCORBIC ACID) 500 MG tablet Take 500 mg by mouth daily.   No facility-administered encounter medications on file as of 03/16/2022.    Patient Active Problem List   Diagnosis Date Noted   Ingrown toenail of right foot 07/19/2021   LOM (left otitis media) 01/30/2021   Hyperglycemia 06/28/2020   Head injury due to trauma 03/02/2020   Pedal edema 08/26/2019   Impetigo 02/03/2019   Constipation 12/07/2018   Nasal turbinate hypertrophy 07/27/2016   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   Hyperlipidemia, mild 04/18/2015   Allergic state 02/17/2014   Alcohol abuse, 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   Bipolar disorder, mixed (HCC) 02/05/2013   Tachycardia  07/07/2012   ED (erectile dysfunction) 05/12/2012   Preventative health care 10/03/2011   Nicotine dependence 04/28/2011   ADHD (attention deficit hyperactivity disorder) 04/02/2011   Anxiety 04/02/2011   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  Limited Social Support, Mental Health Concerns, Social Isolation, and  Lacks Knowledge of [facility].  Care Plan : LCSW Plan of Care  Updates made by Karolee Stamps, LCSW since 03/16/2022 12:00 AM     Problem: Reduce and Manage My Symptoms of Severe Depression.   Priority: High     Goal: Reduce and Manage My Symptoms of Severe Depression.   Start Date: 03/02/2022  Expected End Date: 06/01/2022  This Visit's Progress: On track  Recent Progress: On track  Priority: High  Note:   Current Barriers:   Acute Mental Health needs related to Attention Deficit Hyperactivity Disorder, Alcohol Abuse - in Remission, Anxiety, Mixed Bipolar Disorder, Head Injury Due to Trauma, Nicotine Dependence, and Severe Depression, requires Support, Education, Resources, Referrals, Advocacy, and Care Coordination, in order to meet unmet Acute Mental Health needs. Clinical Goal(s):  Patient will work with LCSW, to reduce and manage symptoms of Severe Depression, until well-established with a community mental health provider.     Patient will increase knowledge and/or ability of:        Coping Skills, Healthy Habits, Self-Management Skills, Stress Reduction, Home Safety, and Utilizing [employer] and Resources.   Interventions: Collaboration with Primary Care Physician, Dr. Danise Edge regarding development and update of comprehensive plan of care, as evidenced by provider attestation and co-signature. Inter-disciplinary care team collaboration (see longitudinal plan of care). Clinical Interventions:  Mindfulness Meditation Strategies, Relaxation Techniques and Deep Breathing Exercises reviewed, and encouraged daily. Solution-Focused Therapy performed. Verbalization of Feelings encouraged. Emotional Support provided. Problem Solving Solutions developed. Cognitive Behavioral Therapy initiated. Client-Centered Therapy promoted. Increase Level of Activity/Exercise emphasized.    Patient Goals/Self-Care Activities: Continue to receive personal counseling with LCSW,  on a weekly basis, to reduce and manage symptoms of Severe Depression, until well-established with a community mental health provider.   Continue to incorporate into daily practice - relaxation techniques, deep breathing exercises, and mindfulness meditation strategies. Continue to review of the following list of counseling agencies and resources, in an effort to establish services, notifying LCSW if you require assistance with the referral process: ~ List of Medicaid Approved Psychiatrists in [place], [place] ~ List of Therapists and Psychiatrists in [place], [place] ~ Therapists in [facility] that Accept Medicaid ~ Mental Health Resources ~ Mental Health & Substance Abuse Resources LCSW collaboration with Primary Care Physician, Dr. Danise Edge to request review of current psychotropic medication regimen. ~ Dr. Danise Edge would like for you to remain on your current psychotropic medication regimen, until well-established with a psychiatrist for psychotropic medication management. Read "Make Your Bed: Little Things That Can Change Your Life.Marland KitchenMarland KitchenAnd Maybe the World", by Nira Conn, and highlight points of interest.  Contact LCSW directly (# [phone]), if you have questions, need assistance, or if additional social work needs are identified between now and our next scheduled telephone outreach call.  Follow-Up:  04/02/2022 at 10:45 am    Danford Bad LCSW Licensed Clinical Social Worker [facility] [employer] [phone]

## 2022-03-16 NOTE — Patient Instructions (Addendum)
Visit Information  Thank you for taking time to visit with me today. Please don't hesitate to contact me if I can be of assistance to you before our next scheduled telephone appointment.  Following are the goals we discussed today:  Patient Goals/Self-Care Activities: Continue to receive personal counseling with LCSW, on a weekly basis, to reduce and manage symptoms of Severe Depression, until well-established with a community mental health provider.   Continue to incorporate into daily practice - relaxation techniques, deep breathing exercises, and mindfulness meditation strategies. Continue to review of the following list of counseling agencies and resources, in an effort to establish services, notifying LCSW if you require assistance with the referral process: ~ List of Medicaid Approved Psychiatrists in [place], [place] ~ List of Therapists and Psychiatrists in [place], [place] ~ Therapists in [facility] that Accept Medicaid ~ Mental Health Resources ~ Mental Health & Substance Abuse Resources LCSW collaboration with Primary Care Physician, Dr. Danise Edge to request review of current psychotropic medication regimen. ~ Dr. Danise Edge would like for you to remain on your current psychotropic medication regimen, until well-established with a psychiatrist for psychotropic medication management. Read "Make Your Bed: Little Things That Can Change Your Life.Marland KitchenMarland KitchenAnd Maybe the World", by Nira Conn, and highlight points of interest.  Contact LCSW directly (# [phone]), if you have questions, need assistance, or if additional social work needs are identified between now and our next scheduled telephone outreach call.  Follow-Up:  04/02/2022 at 10:45 am   Please call the care guide team at [phone] if you need to cancel or reschedule your appointment.   If you are experiencing a Mental Health or Behavioral Health Crisis or need someone to talk to, please call the Suicide and  Crisis Lifeline: 988 call the [place] National Suicide Prevention Lifeline: 7635146955 or TTY: (509)701-4273 TTY 309-223-2047) to talk to a trained counselor call 1-800-273-TALK (toll free, 24 hour hotline) go to [facility] [address] [place] [phone]) call the [facility] Crisis Line: ([phone] call 911   Patient verbalizes understanding of instructions and care plan provided today and agrees to view in MyChart. Active MyChart status and patient understanding of how to access instructions and care plan via MyChart confirmed with patient.     Danford Bad LCSW Licensed Clinical Social Worker [facility] [employer] [phone]

## 2022-03-21 ENCOUNTER — Other Ambulatory Visit: Payer: Self-pay | Admitting: Family Medicine

## 2022-03-28 DIAGNOSIS — F3160 Bipolar disorder, current episode mixed, unspecified: Secondary | ICD-10-CM

## 2022-04-02 ENCOUNTER — Telehealth: Payer: Medicare Other | Admitting: *Deleted

## 2022-04-02 ENCOUNTER — Telehealth: Payer: Self-pay | Admitting: *Deleted

## 2022-04-02 NOTE — Telephone Encounter (Signed)
  Care Management   Follow Up Note   04/02/2022  Name: Jesse Jones MRN: [id number] DOB: 12-21-1980  Referred By: Bradd Canary, MD  Reason for Referral: Chronic Care Management Needs in Patient with Attention Deficit Hyperactivity Disorder, Alcohol Abuse - in Remission, Anxiety, Mixed Bipolar Disorder, Head Injury Due to Trauma, Nicotine Dependence, and Depression.  An unsuccessful telephone outreach was attempted today. The patient was referred to the case management team for assistance with care management and care coordination. A HIPAA compliant message was left on voicemail, providing contact information, encouraging patient to return LCSW's call at his earliest convenience.  LCSW will make a second telephone outreach call attempt within the next 7-10 business days, if a return call is not received from patient in the meantime.  Follow-Up Plan:  Request placed with Scheduling Care Guide to reschedule patient's follow-up telephone outreach call with LCSW.  Danford Bad LCSW Licensed Clinical Social Worker [facility] [employer] [phone]

## 2022-04-04 ENCOUNTER — Telehealth: Payer: Self-pay | Admitting: *Deleted

## 2022-04-04 NOTE — Chronic Care Management (AMB) (Signed)
  Chronic Care Management Note  04/04/2022 Name: TALLEN SCHNORR MRN: [id number] DOB: 1981/07/05  [name] is a 41 y.o. year old male who is a primary care patient of Bradd Canary, MD and is actively engaged with the care management team. I reached out to [name] by phone today to assist with re-scheduling a follow up visit with the Licensed Clinical Social Worker  Follow up plan: Telephone appointment with care management team member scheduled for: 04/10/2022  Burman Nieves, CCMA Care Guide, Embedded Care Coordination [facility]  Care Management  Direct Dial: [phone]

## 2022-04-05 ENCOUNTER — Telehealth: Payer: Self-pay | Admitting: *Deleted

## 2022-04-05 NOTE — Telephone Encounter (Signed)
  Care Management     04/04/2022   Name: Jesse Jones        MRN: [id number]       DOB: 1981/08/31   Referred By: Bradd Canary, MD   Reason for Referral:  Chronic Care Management Needs in Patient with Attention Deficit Hyperactivity Disorder, Alcohol Abuse - in Remission, Anxiety, Mixed Bipolar Disorder, Head Injury Due to Trauma, Nicotine Dependence, and Depression.   An unsuccessful telephone outreach was attempted today. The patient was referred to the case management team for assistance with care management and care coordination. A HIPAA compliant message was left on voicemail, providing contact information, encouraging patient to return LCSW's call at his earliest convenience. LCSW will make a second telephone outreach call attempt within the next 7-10 business days, if a return call is not received from patient in the meantime.   Follow-Up Plan:  Request placed with Scheduling Care Guide to reschedule patient's follow-up telephone outreach call with LCSW.   Danford Bad LCSW Licensed Clinical Social Worker [facility] [employer] [phone]

## 2022-04-10 ENCOUNTER — Telehealth: Payer: Medicare Other

## 2022-04-11 ENCOUNTER — Other Ambulatory Visit (HOSPITAL_BASED_OUTPATIENT_CLINIC_OR_DEPARTMENT_OTHER): Payer: Self-pay

## 2022-04-11 ENCOUNTER — Other Ambulatory Visit: Payer: Self-pay | Admitting: Family Medicine

## 2022-04-11 ENCOUNTER — Telehealth: Payer: Self-pay

## 2022-04-11 MED ORDER — ALPRAZOLAM ER 3 MG PO TB24
3.0000 mg | ORAL_TABLET | Freq: Every morning | ORAL | 0 refills | Status: DC
  Filled 2022-04-11 – 2022-04-13 (×2): qty 30, 30d supply, fill #0

## 2022-04-11 NOTE — Telephone Encounter (Signed)
Pt called stating his alprazeolam was denied for refill and he was wondering why. Pt would like a call back to go over this info, when available.

## 2022-04-11 NOTE — Telephone Encounter (Signed)
Requesting: alprazolam XR 3mg   Contract: 03/27/2018 UDS: 01/31/22 Last Visit:  02/27/22 Next Visit: None Last Refill: 03/06/22 #30 and 0RF  Please Advise

## 2022-04-11 NOTE — Telephone Encounter (Signed)
I do not see where it was denied. Rx was sent.    Disp Refills Start End   ALPRAZolam (XANAX XR) 3 MG 24 hr tablet 30 tablet 0 04/11/2022    Sig - Route: Take 1 tablet (3 mg total) by mouth every morning. - Oral   Sent to pharmacy as: ALPRAZolam (XANAX XR) 3 MG 24 hr tablet

## 2022-04-12 ENCOUNTER — Other Ambulatory Visit: Payer: Self-pay | Admitting: Family Medicine

## 2022-04-12 ENCOUNTER — Other Ambulatory Visit (HOSPITAL_BASED_OUTPATIENT_CLINIC_OR_DEPARTMENT_OTHER): Payer: Self-pay

## 2022-04-12 NOTE — Telephone Encounter (Signed)
Requesting: Vyvanse 50mg   Contract: 03/27/2018 UDS: 01/31/22 Last Visit: 02/27/22 Next Visit: None Last Refill: 03/06/22 #30 and 0RF  Please Advise

## 2022-04-13 ENCOUNTER — Other Ambulatory Visit (HOSPITAL_BASED_OUTPATIENT_CLINIC_OR_DEPARTMENT_OTHER): Payer: Self-pay

## 2022-04-13 MED ORDER — LISDEXAMFETAMINE DIMESYLATE 50 MG PO CAPS
50.0000 mg | ORAL_CAPSULE | Freq: Every day | ORAL | 0 refills | Status: DC
  Filled 2022-04-13: qty 30, 30d supply, fill #0

## 2022-04-16 ENCOUNTER — Ambulatory Visit (INDEPENDENT_AMBULATORY_CARE_PROVIDER_SITE_OTHER): Payer: Medicare Other | Admitting: *Deleted

## 2022-04-16 DIAGNOSIS — S0990XA Unspecified injury of head, initial encounter: Secondary | ICD-10-CM

## 2022-04-16 DIAGNOSIS — F339 Major depressive disorder, recurrent, unspecified: Secondary | ICD-10-CM

## 2022-04-16 DIAGNOSIS — F902 Attention-deficit hyperactivity disorder, combined type: Secondary | ICD-10-CM

## 2022-04-16 DIAGNOSIS — F419 Anxiety disorder, unspecified: Secondary | ICD-10-CM

## 2022-04-16 DIAGNOSIS — R45851 Suicidal ideations: Secondary | ICD-10-CM

## 2022-04-16 DIAGNOSIS — F3160 Bipolar disorder, current episode mixed, unspecified: Secondary | ICD-10-CM

## 2022-04-16 NOTE — Patient Instructions (Signed)
Visit Information  Thank you for taking time to visit with me today. Please don't hesitate to contact me if I can be of assistance to you before our next scheduled telephone appointment.  Following are the goals we discussed today:  Patient Goals/Self-Care Activities: Continue to receive personal counseling with LCSW, on a weekly basis, to reduce and manage symptoms of Severe Depression, until well-established with a community mental health provider.   Continue to incorporate into daily practice - relaxation techniques, deep breathing exercises, and mindfulness meditation strategies. Continue to review of the following list of counseling agencies and resources, in an effort to establish services, notifying LCSW if you require assistance with the referral process: ~ List of Medicaid Approved Psychiatrists in [place], [place] ~ List of Therapists and Psychiatrists in [place], [place] ~ Therapists in [facility] that Accept Medicaid ~ Mental Health Resources ~ Mental Health & Substance Abuse Resources LCSW collaboration with Primary Care Physician, Dr. Danise Edge to report you having to double-up on your Xanax dosage on 04/15/2022, due to persistent panic attacks throughout the day, per your request.   Contact [facility] (# [phone]), to schedule an initial appointment with a psychiatrist for psychotropic medication management, as well as a therapist, for psychotherapeutic counseling services. Contact LCSW directly (# [phone]), if you have questions, need assistance, or if additional social work needs are identified between now and our next scheduled telephone outreach call.  Follow-Up:  04/27/2022 at 11:15 am  Please call the care guide team at [phone] if you need to cancel or reschedule your appointment.   If you are experiencing a Mental Health or Behavioral Health Crisis or need someone to talk to, please call the Suicide and Crisis Lifeline:  988 call the [place] National Suicide Prevention Lifeline: 320-273-6617 or TTY: 973-562-7845 TTY 810-121-2130) to talk to a trained counselor call 1-800-273-TALK (toll free, 24 hour hotline) go to [facility] [address] [place] [phone]) call the [facility] Crisis Line: [phone] call 911   Patient verbalizes understanding of instructions and care plan provided today and agrees to view in MyChart. Active MyChart status and patient understanding of how to access instructions and care plan via MyChart confirmed with patient.     Danford Bad LCSW Licensed Clinical Social Worker [facility] [employer] [phone]

## 2022-04-16 NOTE — Chronic Care Management (AMB) (Signed)
Chronic Care Management    Clinical Social Work Note  04/16/2022 Name: Jesse Jones MRN: [id number] DOB: Mar 21, 1981  Jesse Jones is a 41 y.o. year old male who is a primary care patient of Bradd Canary, MD. The CCM team was consulted to assist the patient with chronic disease management and/or care coordination needs related to: Appointment Scheduling Needs, [employer], and Mental Health Counseling and Resources.   Engaged with patient by telephone for follow up visit in response to provider referral for social work chronic care management and care coordination services.   Consent to Services:  The patient was given information about Chronic Care Management services, agreed to services, and gave verbal consent prior to initiation of services.  Please see initial visit note for detailed documentation.   Patient agreed to services and consent obtained.   Assessment: Review of patient past medical history, allergies, medications, and health status, including review of relevant consultants reports was performed today as part of a comprehensive evaluation and provision of chronic care management and care coordination services.     SDOH (Social Determinants of Health) assessments and interventions performed:    Advanced Directives Status: Not addressed in this encounter.  CCM Care Plan  Allergies  Allergen Reactions   Haloperidol Decanoate    Hydroxyzine Anxiety    EXCESSIVE ANXIOUNESS   Other Other (See Comments)    Reverse effect, super hyper   Penicillins Hives   Valium     Outpatient Encounter Medications as of 04/16/2022  Medication Sig   ALPRAZolam (XANAX XR) 3 MG 24 hr tablet Take 1 tablet (3 mg total) by mouth every morning.   cholecalciferol (VITAMIN D) 1000 UNITS tablet Take 1,000 Units by mouth every morning.   citalopram (CELEXA) 20 MG tablet Take 1 tablet (20 mg total) by mouth daily.   clindamycin-benzoyl peroxide (BENZACLIN) gel Apply topically 2  (two) times daily.   fish oil-omega-3 fatty acids 1000 MG capsule Take 2 g by mouth daily.   fluticasone (FLONASE) 50 MCG/ACT nasal spray PLACE 2 SPRAYS INTO BOTH NOSTRILS DAILY AS NEEDED FOR ALLERGIES OR RHINITIS.   furosemide (LASIX) 20 MG tablet TAKE 1 TABLET BY MOUTH EVERY DAY AS NEEDED   lisdexamfetamine (VYVANSE) 50 MG capsule Take 1 capsule (50 mg total) by mouth daily.   methocarbamol (ROBAXIN) 500 MG tablet Take 1 tablet (500 mg total) by mouth 2 (two) times daily.   Multiple Vitamin (MULTIVITAMIN) tablet Take 1 tablet by mouth every morning.   OLANZapine (ZYPREXA) 10 MG tablet TAKE 1 TABLET BY MOUTH EVERYDAY AT BEDTIME   tiZANidine (ZANAFLEX) 4 MG capsule Take 1 capsule (4 mg total) by mouth 2 (two) times daily as needed for muscle spasms.   vitamin B-12 (CYANOCOBALAMIN) 1000 MCG tablet Take 1,000 mcg by mouth every morning.   vitamin C (ASCORBIC ACID) 500 MG tablet Take 500 mg by mouth daily.   No facility-administered encounter medications on file as of 04/16/2022.    Patient Active Problem List   Diagnosis Date Noted   Ingrown toenail of right foot 07/19/2021   LOM (left otitis media) 01/30/2021   Hyperglycemia 06/28/2020   Head injury due to trauma 03/02/2020   Pedal edema 08/26/2019   Impetigo 02/03/2019   Constipation 12/07/2018   Nasal turbinate hypertrophy 07/27/2016   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   Hyperlipidemia, mild 04/18/2015   Allergic state 02/17/2014   Alcohol abuse, 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   Bipolar disorder, mixed (HCC) 02/05/2013   Tachycardia 07/07/2012  ED (erectile dysfunction) 05/12/2012   Preventative health care 10/03/2011   Nicotine dependence 04/28/2011   ADHD (attention deficit hyperactivity disorder) 04/02/2011   Anxiety 04/02/2011   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  Limited Social Support, Mental Health Concerns, Social Isolation, and Lacks  Knowledge of [employer].  Care Plan : LCSW Plan of Care  Updates made by Karolee Stamps, LCSW since 04/16/2022 12:00 AM     Problem: Reduce and Manage My Symptoms of Severe Depression.   Priority: High     Goal: Reduce and Manage My Symptoms of Severe Depression.   Start Date: 03/02/2022  Expected End Date: 06/01/2022  This Visit's Progress: On track  Recent Progress: On track  Priority: High  Note:   Current Barriers:   Acute Mental Health needs related to Attention Deficit Hyperactivity Disorder, Alcohol Abuse - in Remission, Anxiety, Mixed Bipolar Disorder, Head Injury Due to Trauma, Nicotine Dependence, and Severe Depression, requires Support, Education, Resources, Referrals, Advocacy, and Care Coordination, in order to meet unmet Acute Mental Health needs. Clinical Goal(s):  Patient will work with LCSW, to reduce and manage symptoms of Severe Depression, until well-established with a community mental health provider.     Patient will increase knowledge and/or ability of:        Coping Skills, Healthy Habits, Self-Management Skills, Stress Reduction, Home Safety, and Utilizing [employer] and Resources.   Interventions: Collaboration with Primary Care Physician, Dr. Danise Edge regarding development and update of comprehensive plan of care, as evidenced by provider attestation and co-signature. Inter-disciplinary care team collaboration (see longitudinal plan of care). Clinical Interventions:  Mindfulness Meditation Strategies, Relaxation Techniques and Deep Breathing Exercises reviewed, and encouraged daily. Solution-Focused Therapy performed. Verbalization of Feelings encouraged. Emotional Support provided. Problem Solving Solutions developed. Cognitive Behavioral Therapy initiated. Client-Centered Therapy promoted. Increase Level of Activity/Exercise emphasized.    Patient Goals/Self-Care Activities: Continue to receive personal counseling with LCSW, on a  weekly basis, to reduce and manage symptoms of Severe Depression, until well-established with a community mental health provider.   Continue to incorporate into daily practice - relaxation techniques, deep breathing exercises, and mindfulness meditation strategies. Continue to review of the following list of counseling agencies and resources, in an effort to establish services, notifying LCSW if you require assistance with the referral process: ~ List of Medicaid Approved Psychiatrists in [place], [place] ~ List of Therapists and Psychiatrists in [place], [place] ~ Therapists in [facility] that Accept Medicaid ~ Mental Health Resources ~ Mental Health & Substance Abuse Resources LCSW collaboration with Primary Care Physician, Dr. Danise Edge to report you having to double-up on your Xanax dosage on 04/15/2022, due to persistent panic attacks throughout the day, per your request.   Contact [facility] (# [phone]), to schedule an initial appointment with a psychiatrist for psychotropic medication management, as well as a therapist, for psychotherapeutic counseling services. Contact LCSW directly (# [phone]), if you have questions, need assistance, or if additional social work needs are identified between now and our next scheduled telephone outreach call.  Follow-Up:  04/27/2022 at 11:15 am   Danford Bad LCSW Licensed Clinical Social Worker [facility] [employer] [phone]

## 2022-04-18 ENCOUNTER — Ambulatory Visit: Payer: Medicare Other | Admitting: *Deleted

## 2022-04-18 DIAGNOSIS — F1011 Alcohol abuse, in remission: Secondary | ICD-10-CM

## 2022-04-18 DIAGNOSIS — F902 Attention-deficit hyperactivity disorder, combined type: Secondary | ICD-10-CM

## 2022-04-18 DIAGNOSIS — R45851 Suicidal ideations: Secondary | ICD-10-CM

## 2022-04-18 DIAGNOSIS — F419 Anxiety disorder, unspecified: Secondary | ICD-10-CM

## 2022-04-18 DIAGNOSIS — F339 Major depressive disorder, recurrent, unspecified: Secondary | ICD-10-CM

## 2022-04-18 DIAGNOSIS — F908 Attention-deficit hyperactivity disorder, other type: Secondary | ICD-10-CM

## 2022-04-18 DIAGNOSIS — S0990XA Unspecified injury of head, initial encounter: Secondary | ICD-10-CM

## 2022-04-18 DIAGNOSIS — F3160 Bipolar disorder, current episode mixed, unspecified: Secondary | ICD-10-CM

## 2022-04-18 NOTE — Chronic Care Management (AMB) (Signed)
Chronic Care Management    Clinical Social Work Note  04/18/2022 Name: Jesse Jones MRN: [id number] DOB: Apr 03, 1981  Jesse Jones is a 41 y.o. year old male who is a primary care patient of Jesse Canary, MD. The CCM team was consulted to assist the patient with chronic disease management and/or care coordination needs related to: Appointment Scheduling Needs, [employer], Mental Health Counseling and Resources, and Caregiver Stress.   Engaged with patient's father by telephone for follow up visit in response to provider referral for social work chronic care management and care coordination services.   Consent to Services:  The patient was given information about Chronic Care Management services, agreed to services, and gave verbal consent prior to initiation of services.  Please see initial visit note for detailed documentation.   Patient agreed to services and consent obtained.   Assessment: Review of patient past medical history, allergies, medications, and health status, including review of relevant consultants reports was performed today as part of a comprehensive evaluation and provision of chronic care management and care coordination services.     SDOH (Social Determinants of Health) assessments and interventions performed:    Advanced Directives Status: Not addressed in this encounter.  CCM Care Plan  Allergies  Allergen Reactions   Haloperidol Decanoate    Hydroxyzine Anxiety    EXCESSIVE ANXIOUNESS   Other Other (See Comments)    Reverse effect, super hyper   Penicillins Hives   Valium     Outpatient Encounter Medications as of 04/18/2022  Medication Sig   ALPRAZolam (XANAX XR) 3 MG 24 hr tablet Take 1 tablet (3 mg total) by mouth every morning.   cholecalciferol (VITAMIN D) 1000 UNITS tablet Take 1,000 Units by mouth every morning.   citalopram (CELEXA) 20 MG tablet Take 1 tablet (20 mg total) by mouth daily.   clindamycin-benzoyl peroxide  (BENZACLIN) gel Apply topically 2 (two) times daily.   fish oil-omega-3 fatty acids 1000 MG capsule Take 2 g by mouth daily.   fluticasone (FLONASE) 50 MCG/ACT nasal spray PLACE 2 SPRAYS INTO BOTH NOSTRILS DAILY AS NEEDED FOR ALLERGIES OR RHINITIS.   furosemide (LASIX) 20 MG tablet TAKE 1 TABLET BY MOUTH EVERY DAY AS NEEDED   lisdexamfetamine (VYVANSE) 50 MG capsule Take 1 capsule (50 mg total) by mouth daily.   methocarbamol (ROBAXIN) 500 MG tablet Take 1 tablet (500 mg total) by mouth 2 (two) times daily.   Multiple Vitamin (MULTIVITAMIN) tablet Take 1 tablet by mouth every morning.   OLANZapine (ZYPREXA) 10 MG tablet TAKE 1 TABLET BY MOUTH EVERYDAY AT BEDTIME   tiZANidine (ZANAFLEX) 4 MG capsule Take 1 capsule (4 mg total) by mouth 2 (two) times daily as needed for muscle spasms.   vitamin B-12 (CYANOCOBALAMIN) 1000 MCG tablet Take 1,000 mcg by mouth every morning.   vitamin C (ASCORBIC ACID) 500 MG tablet Take 500 mg by mouth daily.   No facility-administered encounter medications on file as of 04/18/2022.    Patient Active Problem List   Diagnosis Date Noted   Ingrown toenail of right foot 07/19/2021   LOM (left otitis media) 01/30/2021   Hyperglycemia 06/28/2020   Head injury due to trauma 03/02/2020   Pedal edema 08/26/2019   Impetigo 02/03/2019   Constipation 12/07/2018   Nasal turbinate hypertrophy 07/27/2016   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   Hyperlipidemia, mild 04/18/2015   Allergic state 02/17/2014   Alcohol abuse, 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   Bipolar disorder, mixed (HCC) 02/05/2013  Tachycardia 07/07/2012   ED (erectile dysfunction) 05/12/2012   Preventative health care 10/03/2011   Nicotine dependence 04/28/2011   ADHD (attention deficit hyperactivity disorder) 04/02/2011   Anxiety 04/02/2011   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  Limited Social Support, Medication Procurement, Mental Health Concerns, Social Isolation, and Lacks Knowledge of [employer].  Care Plan : LCSW Plan of Care  Updates made by Karolee Stamps, LCSW since 04/18/2022 12:00 AM     Problem: Reduce and Manage My Symptoms of Severe Depression.   Priority: High     Goal: Reduce and Manage My Symptoms of Severe Depression.   Start Date: 03/02/2022  Expected End Date: 06/01/2022  This Visit's Progress: On track  Recent Progress: On track  Priority: High  Note:   Current Barriers:   Acute Mental Health needs related to Attention Deficit Hyperactivity Disorder, Alcohol Abuse - in Remission, Anxiety, Mixed Bipolar Disorder, Head Injury Due to Trauma, Nicotine Dependence, and Severe Depression, requires Support, Education, Resources, Referrals, Advocacy, and Care Coordination, in order to meet unmet Acute Mental Health needs. Clinical Goal(s):  Patient will work with LCSW, to reduce and manage symptoms of Severe Depression, until well-established with a community mental health provider.     Patient will increase knowledge and/or ability of:        Coping Skills, Healthy Habits, Self-Management Skills, Stress Reduction, Home Safety, and Utilizing [employer] and Resources.   Interventions: Collaboration with Primary Care Physician, Dr. Danise Edge regarding development and update of comprehensive plan of care, as evidenced by provider attestation and co-signature. Inter-disciplinary care team collaboration (see longitudinal plan of care). Clinical Interventions:  Mindfulness Meditation Strategies, Relaxation Techniques and Deep Breathing Exercises encouraged daily. Solution-Focused Therapy performed. Verbalization of Feelings encouraged. Emotional Support provided. Problem Solving Solutions developed. Cognitive Behavioral Therapy initiated. Client-Centered Therapy promoted.  Patient Goals/Self-Care Activities: Continue to  receive personal counseling with LCSW, on a weekly basis, to reduce and manage symptoms of Severe Depression, until well-established with a community mental health provider.   Continue to incorporate into daily practice - relaxation techniques, deep breathing exercises, and mindfulness meditation strategies. Father will contact [facility] (# [phone]), to assist you in establishing services with a psychiatrist for psychotropic medication management, as well as a therapist for psychotherapeutic counseling services. LCSW collaboration with Primary Care Physician, Dr. Danise Edge to report your father's concerns about your current psychotropic medication regimen.  ~ Have been taking (Celexa) Citalopram since age 67. ~ Dosage of (Celexa) Citalopram was increased from 10 mg, po, daily, to 20 mg, po, daily, on 02/27/2022, but patient's symptoms appear to have gotten worse during this time period.   ~ Is it safe and/or counterintuitive to take (Vyvanse) Lisdexamfetamine in combination with (Celexa) Citalopram? Father agreed to contact [facility] [phone]), to schedule a follow-up appointment with your Primary Care Physician, Dr. Danise Edge, to discuss your current psychotropic medication regimen. Contact LCSW directly (# [phone]), if you have questions, need assistance, or if additional social work needs are identified between now and our next scheduled telephone outreach call.  Follow-Up:  04/27/2022 at 11:15 am   Danford Bad LCSW Licensed Clinical Social Worker [facility] [employer] [phone]

## 2022-04-18 NOTE — Patient Instructions (Addendum)
Visit Information  Thank you for taking time to visit with me today. Please don't hesitate to contact me if I can be of assistance to you before our next scheduled telephone appointment.  Following are the goals we discussed today:  Patient Goals/Self-Care Activities: Continue to receive personal counseling with LCSW, on a weekly basis, to reduce and manage symptoms of Severe Depression, until well-established with a community mental health provider.   Continue to incorporate into daily practice - relaxation techniques, deep breathing exercises, and mindfulness meditation strategies. Father will contact [facility] (# [phone]), to assist you in establishing services with a psychiatrist for psychotropic medication management, as well as a therapist for psychotherapeutic counseling services. LCSW collaboration with Primary Care Physician, Dr. Danise Edge to report your father's concerns about your current psychotropic medication regimen.  ~ Have been taking (Celexa) Citalopram since age 65. ~ Dosage of (Celexa) Citalopram was increased from 10 mg, po, daily, to 20 mg, po, daily, on 02/27/2022, but patient's symptoms appear to have gotten worse during this time period.   ~ Is it safe and/or counterintuitive to take (Vyvanse) Lisdexamfetamine in combination with (Celexa) Citalopram? Father agreed to contact [facility] [phone]), to schedule a follow-up appointment with your Primary Care Physician, Dr. Danise Edge, to discuss your current psychotropic medication regimen. Contact LCSW directly (# [id number]), if you have questions, need assistance, or if additional social work needs are identified between now and our next scheduled telephone outreach call.  Follow-Up:  04/27/2022 at 11:15 am  Please call the care guide team at [phone] if you need to cancel or reschedule your appointment.   If you are experiencing a Mental Health or  Behavioral Health Crisis or need someone to talk to, please call the Suicide and Crisis Lifeline: 988 call the [place] National Suicide Prevention Lifeline: 989-241-0295 or TTY: 754-164-1362 TTY 712 223 2241) to talk to a trained counselor call 1-800-273-TALK (toll free, 24 hour hotline) go to [facility] [address], [place] [phone]) call the [facility] Crisis Line: [phone] call 911   Patient verbalizes understanding of instructions and care plan provided today and agrees to view in MyChart. Active MyChart status and patient understanding of how to access instructions and care plan via MyChart confirmed with patient.     Danford Bad LCSW Licensed Clinical Social Worker [facility] [employer] [phone]

## 2022-04-20 ENCOUNTER — Telehealth (INDEPENDENT_AMBULATORY_CARE_PROVIDER_SITE_OTHER): Payer: Medicare Other | Admitting: Family Medicine

## 2022-04-20 ENCOUNTER — Other Ambulatory Visit (HOSPITAL_BASED_OUTPATIENT_CLINIC_OR_DEPARTMENT_OTHER): Payer: Self-pay

## 2022-04-20 ENCOUNTER — Encounter: Payer: Self-pay | Admitting: Family Medicine

## 2022-04-20 DIAGNOSIS — F339 Major depressive disorder, recurrent, unspecified: Secondary | ICD-10-CM

## 2022-04-20 MED ORDER — ESCITALOPRAM OXALATE 10 MG PO TABS
10.0000 mg | ORAL_TABLET | Freq: Every day | ORAL | 0 refills | Status: DC
  Filled 2022-04-20: qty 90, 90d supply, fill #0

## 2022-04-24 ENCOUNTER — Ambulatory Visit: Payer: Medicare Other | Admitting: *Deleted

## 2022-04-24 DIAGNOSIS — F339 Major depressive disorder, recurrent, unspecified: Secondary | ICD-10-CM

## 2022-04-24 DIAGNOSIS — E785 Hyperlipidemia, unspecified: Secondary | ICD-10-CM

## 2022-04-24 DIAGNOSIS — F3160 Bipolar disorder, current episode mixed, unspecified: Secondary | ICD-10-CM

## 2022-04-24 DIAGNOSIS — F902 Attention-deficit hyperactivity disorder, combined type: Secondary | ICD-10-CM

## 2022-04-24 DIAGNOSIS — F411 Generalized anxiety disorder: Secondary | ICD-10-CM | POA: Diagnosis not present

## 2022-04-24 DIAGNOSIS — Z79899 Other long term (current) drug therapy: Secondary | ICD-10-CM | POA: Diagnosis not present

## 2022-04-24 DIAGNOSIS — F3132 Bipolar disorder, current episode depressed, moderate: Secondary | ICD-10-CM | POA: Diagnosis not present

## 2022-04-24 DIAGNOSIS — R45851 Suicidal ideations: Secondary | ICD-10-CM

## 2022-04-24 DIAGNOSIS — F908 Attention-deficit hyperactivity disorder, other type: Secondary | ICD-10-CM

## 2022-04-24 DIAGNOSIS — F419 Anxiety disorder, unspecified: Secondary | ICD-10-CM

## 2022-04-24 DIAGNOSIS — S0990XA Unspecified injury of head, initial encounter: Secondary | ICD-10-CM

## 2022-04-24 NOTE — Chronic Care Management (AMB) (Signed)
Chronic Care Management    Clinical Social Work Note  04/24/2022 Name: Jesse Jones MRN: [id number] DOB: Mar 03, 1981  [name] is a 41 y.o. year old male who is a primary care patient of Bradd Canary, MD. The CCM team was consulted to assist the patient with chronic disease management and/or care coordination needs related to: Appointment Scheduling Needs, [employer], Mental Health Counseling and Resources, and Caregiver Stress.   Engaged with patient's father by telephone for follow up visit in response to provider referral for social work chronic care management and care coordination services.   Consent to Services:  The patient was given information about Chronic Care Management services, agreed to services, and gave verbal consent prior to initiation of services.  Please see initial visit note for detailed documentation.   Patient agreed to services and consent obtained.   Assessment: Review of patient past medical history, allergies, medications, and health status, including review of relevant consultants reports was performed today as part of a comprehensive evaluation and provision of chronic care management and care coordination services.     SDOH (Social Determinants of Health) assessments and interventions performed:    Advanced Directives Status: Not addressed in this encounter.  CCM Care Plan  Allergies  Allergen Reactions   Haloperidol Decanoate    Hydroxyzine Anxiety    EXCESSIVE ANXIOUNESS   Other Other (See Comments)    Reverse effect, super hyper   Penicillins Hives   Valium     Outpatient Encounter Medications as of 04/24/2022  Medication Sig   ALPRAZolam (XANAX XR) 3 MG 24 hr tablet Take 1 tablet (3 mg total) by mouth every morning.   cholecalciferol (VITAMIN D) 1000 UNITS tablet Take 1,000 Units by mouth every morning.   clindamycin-benzoyl peroxide (BENZACLIN) gel Apply topically 2 (two) times daily.   escitalopram (LEXAPRO) 10 MG  tablet Take as directed. Week 1: Take 1 tablet of Citalopram (20mg ) and 1/2 tablet of Escitalopram (10mg ) daily Week 2: Take 1/2 tablet of Citalopram (10mg ) and 1 tablet of Escitalopram (10mg ) daily Week 3: Stop Citalopram and take 1 tablet of Escitalopram (10mg ) daily   fish oil-omega-3 fatty acids 1000 MG capsule Take 2 g by mouth daily.   fluticasone (FLONASE) 50 MCG/ACT nasal spray PLACE 2 SPRAYS INTO BOTH NOSTRILS DAILY AS NEEDED FOR ALLERGIES OR RHINITIS.   furosemide (LASIX) 20 MG tablet TAKE 1 TABLET BY MOUTH EVERY DAY AS NEEDED   lisdexamfetamine (VYVANSE) 50 MG capsule Take 1 capsule (50 mg total) by mouth daily.   Multiple Vitamin (MULTIVITAMIN) tablet Take 1 tablet by mouth every morning.   OLANZapine (ZYPREXA) 10 MG tablet TAKE 1 TABLET BY MOUTH EVERYDAY AT BEDTIME   tiZANidine (ZANAFLEX) 4 MG capsule Take 1 capsule (4 mg total) by mouth 2 (two) times daily as needed for muscle spasms.   vitamin B-12 (CYANOCOBALAMIN) 1000 MCG tablet Take 1,000 mcg by mouth every morning.   vitamin C (ASCORBIC ACID) 500 MG tablet Take 500 mg by mouth daily.   No facility-administered encounter medications on file as of 04/24/2022.    Patient Active Problem List   Diagnosis Date Noted   Ingrown toenail of right foot 07/19/2021   LOM (left otitis media) 01/30/2021   Hyperglycemia 06/28/2020   Head injury due to trauma 03/02/2020   Pedal edema 08/26/2019   Impetigo 02/03/2019   Constipation 12/07/2018   Nasal turbinate hypertrophy 07/27/2016   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   Hyperlipidemia, mild 04/18/2015   Allergic state  02/17/2014   Alcohol abuse, 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   Bipolar disorder, mixed (HCC) 02/05/2013   Tachycardia 07/07/2012   ED (erectile dysfunction) 05/12/2012   Preventative health care 10/03/2011   Nicotine dependence 04/28/2011   ADHD (attention deficit hyperactivity disorder) 04/02/2011   Anxiety 04/02/2011   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  Limited Social Support, Mental Health Concerns, Social Isolation, and Lacks Knowledge of [employer].  Care Plan : LCSW Plan of Care  Updates made by Karolee Stamps, LCSW since 04/24/2022 12:00 AM     Problem: Reduce and Manage My Symptoms of Severe Depression.   Priority: High     Goal: Reduce and Manage My Symptoms of Severe Depression.   Start Date: 03/02/2022  Expected End Date: 06/01/2022  This Visit's Progress: On track  Recent Progress: On track  Priority: High  Note:   Current Barriers:   Acute Mental Health needs related to Attention Deficit Hyperactivity Disorder, Alcohol Abuse - in Remission, Anxiety, Mixed Bipolar Disorder, Head Injury Due to Trauma, Nicotine Dependence, and Severe Depression, requires Support, Education, Resources, Referrals, Advocacy, and Care Coordination, in order to meet unmet Acute Mental Health needs. Clinical Goal(s):  Patient will work with LCSW, to reduce and manage symptoms of Severe Depression, until well-established with a community mental health provider.     Patient will increase knowledge and/or ability of:        Coping Skills, Healthy Habits, Self-Management Skills, Stress Reduction, Home Safety, and Utilizing [employer] and Resources.   Interventions: Collaboration with Primary Care Physician, Dr. Danise Edge regarding development and update of comprehensive plan of care, as evidenced by provider attestation and co-signature. Inter-disciplinary care team collaboration (see longitudinal plan of care). Clinical Interventions:  Mindfulness Meditation Strategies, Relaxation Techniques and Deep Breathing Exercises encouraged daily. Solution-Focused Therapy performed. Verbalization of Feelings encouraged. Emotional Support provided. Problem Solving Solutions developed. Cognitive Behavioral Therapy initiated. Client-Centered Therapy  promoted.  Patient Goals/Self-Care Activities: Continue to receive personal counseling with LCSW, on a weekly basis, to reduce and manage symptoms of Severe Depression, until well-established with a community mental health provider.   Continue to incorporate into daily practice - relaxation techniques, deep breathing exercises, and mindfulness meditation strategies. Collaboration with Kerin Salen, Master of Science: Counsellor, with [facility] [phone]), to schedule initial assessment, in an effort to establish psychotropic medication management, as well as psychotherapeutic counseling services.  ~ Initial assessment scheduled on 04/24/2022 at 3:00 pm. LCSW collaboration with Primary Care Physician, Dr. Danise Edge to report your father's concerns about your current psychotropic medication regimen.  ~ Have been taking (Celexa) Citalopram since age 60. ~ Dosage of (Celexa) Citalopram was increased from 10 mg, po, daily, to 20 mg, po, daily, on 02/27/2022, but patient's symptoms appear to have gotten worse during this time period.   ~ Is it safe and/or counterintuitive to take (Vyvanse) Lisdexamfetamine in combination with (Celexa) Citalopram? Father agreed to contact [facility] [phone]), to schedule a follow-up appointment with your Primary Care Physician, Dr. Danise Edge, to discuss your current psychotropic medication regimen. Contact LCSW directly (# [phone]), if you have questions, need assistance, or if additional social work needs are identified between now and our next scheduled telephone outreach call.  Follow-Up:  04/27/2022 at 11:15 am   Danford Bad LCSW Licensed Clinical Social Worker [facility] [employer] [phone]

## 2022-04-26 ENCOUNTER — Ambulatory Visit: Payer: Medicare Other | Admitting: *Deleted

## 2022-04-26 DIAGNOSIS — F419 Anxiety disorder, unspecified: Secondary | ICD-10-CM

## 2022-04-26 DIAGNOSIS — F1011 Alcohol abuse, in remission: Secondary | ICD-10-CM

## 2022-04-26 DIAGNOSIS — F3160 Bipolar disorder, current episode mixed, unspecified: Secondary | ICD-10-CM

## 2022-04-26 DIAGNOSIS — F339 Major depressive disorder, recurrent, unspecified: Secondary | ICD-10-CM

## 2022-04-26 DIAGNOSIS — F908 Attention-deficit hyperactivity disorder, other type: Secondary | ICD-10-CM

## 2022-04-26 DIAGNOSIS — F902 Attention-deficit hyperactivity disorder, combined type: Secondary | ICD-10-CM

## 2022-04-26 DIAGNOSIS — S0990XA Unspecified injury of head, initial encounter: Secondary | ICD-10-CM

## 2022-04-26 DIAGNOSIS — R45851 Suicidal ideations: Secondary | ICD-10-CM

## 2022-04-26 NOTE — Patient Instructions (Signed)
Visit Information  Thank you for taking time to visit with me today. Please don't hesitate to contact me if I can be of assistance to you before our next scheduled telephone appointment.  Following are the goals we discussed today:  Patient Goals/Self-Care Activities: Continue to incorporate into daily practice - relaxation techniques, deep breathing exercises, and mindfulness meditation strategies. Continue to receive psychotherapeutic counseling services, every two weeks, with Kerin Salen, Master of Science: Counsellor, with [facility] [phone]).  Contact LCSW directly (# [phone]), if you have questions, need assistance, or if additional social work needs are identified in the near future.    No Follow-Up Required. Please call the care guide team at [phone] if you need to cancel or reschedule your appointment.   If you are experiencing a Mental Health or Behavioral Health Crisis or need someone to talk to, please call the Suicide and Crisis Lifeline: 988 call the [place] National Suicide Prevention Lifeline: 782-754-6638 or TTY: 548 110 8515 TTY 445-413-8045) to talk to a trained counselor call 1-800-273-TALK (toll free, 24 hour hotline) go to [facility] [address] [place] [phone]) call the [facility] Crisis Line: [phone] call 911   Patient verbalizes understanding of instructions and care plan provided today and agrees to view in MyChart. Active MyChart status and patient understanding of how to access instructions and care plan via MyChart confirmed with patient.     Danford Bad LCSW Licensed Clinical Social Worker [facility] [employer] [phone]

## 2022-04-26 NOTE — Chronic Care Management (AMB) (Signed)
Chronic Care Management    Clinical Social Work Note  04/26/2022 Name: Jesse Jones MRN: [id number] DOB: 09/27/1981  [name] is a 41 y.o. year old male who is a primary care patient of Bradd Canary, MD. The CCM team was consulted to assist the patient with chronic disease management and/or care coordination needs related to: Appointment Scheduling Needs, [employer], and Mental Health Counseling and Resources.   Engaged with patient's father by telephone for follow up visit in response to provider referral for social work chronic care management and care coordination services.   Consent to Services:  The patient was given information about Chronic Care Management services, agreed to services, and gave verbal consent prior to initiation of services.  Please see initial visit note for detailed documentation.   Patient agreed to services and consent obtained.   Assessment: Review of patient past medical history, allergies, medications, and health status, including review of relevant consultants reports was performed today as part of a comprehensive evaluation and provision of chronic care management and care coordination services.     SDOH (Social Determinants of Health) assessments and interventions performed:    Advanced Directives Status: Not addressed in this encounter.  CCM Care Plan  Allergies  Allergen Reactions   Haloperidol Decanoate    Hydroxyzine Anxiety    EXCESSIVE ANXIOUNESS   Other Other (See Comments)    Reverse effect, super hyper   Penicillins Hives   Valium     Outpatient Encounter Medications as of 04/26/2022  Medication Sig   ALPRAZolam (XANAX XR) 3 MG 24 hr tablet Take 1 tablet (3 mg total) by mouth every morning.   cholecalciferol (VITAMIN D) 1000 UNITS tablet Take 1,000 Units by mouth every morning.   clindamycin-benzoyl peroxide (BENZACLIN) gel Apply topically 2 (two) times daily.   escitalopram (LEXAPRO) 10 MG tablet Take as  directed. Week 1: Take 1 tablet of Citalopram (20mg ) and 1/2 tablet of Escitalopram (10mg ) daily Week 2: Take 1/2 tablet of Citalopram (10mg ) and 1 tablet of Escitalopram (10mg ) daily Week 3: Stop Citalopram and take 1 tablet of Escitalopram (10mg ) daily   fish oil-omega-3 fatty acids 1000 MG capsule Take 2 g by mouth daily.   fluticasone (FLONASE) 50 MCG/ACT nasal spray PLACE 2 SPRAYS INTO BOTH NOSTRILS DAILY AS NEEDED FOR ALLERGIES OR RHINITIS.   furosemide (LASIX) 20 MG tablet TAKE 1 TABLET BY MOUTH EVERY DAY AS NEEDED   lisdexamfetamine (VYVANSE) 50 MG capsule Take 1 capsule (50 mg total) by mouth daily.   Multiple Vitamin (MULTIVITAMIN) tablet Take 1 tablet by mouth every morning.   OLANZapine (ZYPREXA) 10 MG tablet TAKE 1 TABLET BY MOUTH EVERYDAY AT BEDTIME   tiZANidine (ZANAFLEX) 4 MG capsule Take 1 capsule (4 mg total) by mouth 2 (two) times daily as needed for muscle spasms.   vitamin B-12 (CYANOCOBALAMIN) 1000 MCG tablet Take 1,000 mcg by mouth every morning.   vitamin C (ASCORBIC ACID) 500 MG tablet Take 500 mg by mouth daily.   No facility-administered encounter medications on file as of 04/26/2022.    Patient Active Problem List   Diagnosis Date Noted   Ingrown toenail of right foot 07/19/2021   LOM (left otitis media) 01/30/2021   Hyperglycemia 06/28/2020   Head injury due to trauma 03/02/2020   Pedal edema 08/26/2019   Impetigo 02/03/2019   Constipation 12/07/2018   Nasal turbinate hypertrophy 07/27/2016   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   Hyperlipidemia, mild 04/18/2015   Allergic state 02/17/2014  Alcohol abuse, 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   Bipolar disorder, mixed (HCC) 02/05/2013   Tachycardia 07/07/2012   ED (erectile dysfunction) 05/12/2012   Preventative health care 10/03/2011   Nicotine dependence 04/28/2011   ADHD (attention deficit hyperactivity disorder) 04/02/2011   Anxiety 04/02/2011   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  Limited Social Support, Mental Health Concerns, Social Isolation, and Lacks Knowledge of [employer].  Care Plan : LCSW Plan of Care  Updates made by Karolee Stamps, LCSW since 04/26/2022 12:00 AM     Problem: Reduce and Manage My Symptoms of Severe Depression. Resolved 04/26/2022  Priority: High     Goal: Reduce and Manage My Symptoms of Severe Depression. Completed 04/26/2022  Start Date: 03/02/2022  Expected End Date: 04/26/2022  This Visit's Progress: On track  Recent Progress: On track  Priority: High  Note:   Current Barriers:   Acute Mental Health needs related to Attention Deficit Hyperactivity Disorder, Alcohol Abuse - in Remission, Anxiety, Mixed Bipolar Disorder, Head Injury Due to Trauma, Nicotine Dependence, and Severe Depression, requires Support, Education, Resources, Referrals, Advocacy, and Care Coordination, in order to meet unmet Acute Mental Health needs. Clinical Goal(s):  Patient will work with LCSW, to reduce and manage symptoms of Severe Depression, until well-established with a community mental health provider.     Patient will increase knowledge and/or ability of:        Coping Skills, Healthy Habits, Self-Management Skills, Stress Reduction, Home Safety, and Utilizing [employer] and Resources.   Interventions: Collaboration with Primary Care Physician, Dr. Danise Edge regarding development and update of comprehensive plan of care, as evidenced by provider attestation and co-signature. Inter-disciplinary care team collaboration (see longitudinal plan of care). Clinical Interventions:  Mindfulness Meditation Strategies, Relaxation Techniques and Deep Breathing Exercises encouraged daily. Solution-Focused Therapy performed. Verbalization of Feelings encouraged. Emotional Support provided. Problem Solving Solutions developed. Cognitive Behavioral Therapy initiated. Client-Centered  Therapy promoted.  Patient Goals/Self-Care Activities: Continue to incorporate into daily practice - relaxation techniques, deep breathing exercises, and mindfulness meditation strategies. Continue to receive psychotherapeutic counseling services, every two weeks, with Kerin Salen, Master of Science: Counsellor, with [facility] [phone]).  Contact LCSW directly (# [phone]), if you have questions, need assistance, or if additional social work needs are identified in the near future.    No Follow-Up Required.   Danford Bad LCSW Licensed Clinical Social Worker [facility] [employer] [phone]

## 2022-04-27 ENCOUNTER — Telehealth: Payer: Medicare Other

## 2022-04-27 DIAGNOSIS — E785 Hyperlipidemia, unspecified: Secondary | ICD-10-CM

## 2022-04-27 DIAGNOSIS — F339 Major depressive disorder, recurrent, unspecified: Secondary | ICD-10-CM

## 2022-04-27 DIAGNOSIS — F3160 Bipolar disorder, current episode mixed, unspecified: Secondary | ICD-10-CM

## 2022-05-02 ENCOUNTER — Other Ambulatory Visit (HOSPITAL_BASED_OUTPATIENT_CLINIC_OR_DEPARTMENT_OTHER): Payer: Self-pay

## 2022-05-02 MED ORDER — ALPRAZOLAM ER 3 MG PO TB24
3.0000 mg | ORAL_TABLET | Freq: Every day | ORAL | 0 refills | Status: DC
  Filled 2022-05-11: qty 30, 30d supply, fill #0
  Filled ????-??-??: fill #0

## 2022-05-02 MED ORDER — VYVANSE 50 MG PO CAPS
50.0000 mg | ORAL_CAPSULE | Freq: Every day | ORAL | 0 refills | Status: DC
  Filled 2022-05-11: qty 30, 30d supply, fill #0
  Filled ????-??-??: fill #0

## 2022-05-08 ENCOUNTER — Other Ambulatory Visit (HOSPITAL_BASED_OUTPATIENT_CLINIC_OR_DEPARTMENT_OTHER): Payer: Self-pay

## 2022-05-08 DIAGNOSIS — F3132 Bipolar disorder, current episode depressed, moderate: Secondary | ICD-10-CM | POA: Diagnosis not present

## 2022-05-08 DIAGNOSIS — F902 Attention-deficit hyperactivity disorder, combined type: Secondary | ICD-10-CM | POA: Diagnosis not present

## 2022-05-08 DIAGNOSIS — F411 Generalized anxiety disorder: Secondary | ICD-10-CM | POA: Diagnosis not present

## 2022-05-08 MED ORDER — ESCITALOPRAM OXALATE 20 MG PO TABS
20.0000 mg | ORAL_TABLET | Freq: Every day | ORAL | 1 refills | Status: DC
  Filled 2022-05-08: qty 30, 30d supply, fill #0

## 2022-05-11 ENCOUNTER — Other Ambulatory Visit (HOSPITAL_BASED_OUTPATIENT_CLINIC_OR_DEPARTMENT_OTHER): Payer: Self-pay

## 2022-05-22 ENCOUNTER — Other Ambulatory Visit (HOSPITAL_BASED_OUTPATIENT_CLINIC_OR_DEPARTMENT_OTHER): Payer: Self-pay

## 2022-05-22 DIAGNOSIS — F3132 Bipolar disorder, current episode depressed, moderate: Secondary | ICD-10-CM | POA: Diagnosis not present

## 2022-05-22 DIAGNOSIS — F902 Attention-deficit hyperactivity disorder, combined type: Secondary | ICD-10-CM | POA: Diagnosis not present

## 2022-05-22 DIAGNOSIS — F411 Generalized anxiety disorder: Secondary | ICD-10-CM | POA: Diagnosis not present

## 2022-05-22 MED ORDER — ALPRAZOLAM ER 3 MG PO TB24
ORAL_TABLET | Freq: Every day | ORAL | 0 refills | Status: DC
Start: 2022-05-22 — End: 2022-07-24
  Filled 2022-06-08 (×2): qty 30, 30d supply, fill #0

## 2022-05-22 MED ORDER — VYVANSE 50 MG PO CAPS
ORAL_CAPSULE | Freq: Every day | ORAL | 0 refills | Status: DC
  Filled 2022-06-08: qty 30, 30d supply, fill #0

## 2022-05-30 DIAGNOSIS — F902 Attention-deficit hyperactivity disorder, combined type: Secondary | ICD-10-CM | POA: Diagnosis not present

## 2022-05-30 DIAGNOSIS — F3132 Bipolar disorder, current episode depressed, moderate: Secondary | ICD-10-CM | POA: Diagnosis not present

## 2022-05-30 DIAGNOSIS — F411 Generalized anxiety disorder: Secondary | ICD-10-CM | POA: Diagnosis not present

## 2022-06-07 ENCOUNTER — Other Ambulatory Visit (HOSPITAL_BASED_OUTPATIENT_CLINIC_OR_DEPARTMENT_OTHER): Payer: Self-pay

## 2022-06-08 ENCOUNTER — Other Ambulatory Visit (HOSPITAL_BASED_OUTPATIENT_CLINIC_OR_DEPARTMENT_OTHER): Payer: Self-pay

## 2022-06-12 ENCOUNTER — Other Ambulatory Visit: Payer: Self-pay | Admitting: Family Medicine

## 2022-06-21 DIAGNOSIS — F411 Generalized anxiety disorder: Secondary | ICD-10-CM | POA: Diagnosis not present

## 2022-06-21 DIAGNOSIS — F902 Attention-deficit hyperactivity disorder, combined type: Secondary | ICD-10-CM | POA: Diagnosis not present

## 2022-06-21 DIAGNOSIS — F3132 Bipolar disorder, current episode depressed, moderate: Secondary | ICD-10-CM | POA: Diagnosis not present

## 2022-07-04 ENCOUNTER — Other Ambulatory Visit (HOSPITAL_BASED_OUTPATIENT_CLINIC_OR_DEPARTMENT_OTHER): Payer: Self-pay

## 2022-07-05 ENCOUNTER — Other Ambulatory Visit (HOSPITAL_BASED_OUTPATIENT_CLINIC_OR_DEPARTMENT_OTHER): Payer: Self-pay

## 2022-07-05 MED ORDER — LISDEXAMFETAMINE DIMESYLATE 50 MG PO CAPS
50.0000 mg | ORAL_CAPSULE | Freq: Every day | ORAL | 0 refills | Status: DC
  Filled 2022-07-06: qty 30, 30d supply, fill #0

## 2022-07-06 ENCOUNTER — Other Ambulatory Visit (HOSPITAL_BASED_OUTPATIENT_CLINIC_OR_DEPARTMENT_OTHER): Payer: Self-pay

## 2022-07-06 MED ORDER — LISDEXAMFETAMINE DIMESYLATE 50 MG PO CAPS
50.0000 mg | ORAL_CAPSULE | Freq: Every day | ORAL | 0 refills | Status: DC
  Filled 2022-07-06 – 2022-08-03 (×2): qty 30, 30d supply, fill #0

## 2022-07-17 DIAGNOSIS — F3132 Bipolar disorder, current episode depressed, moderate: Secondary | ICD-10-CM | POA: Diagnosis not present

## 2022-07-17 DIAGNOSIS — F902 Attention-deficit hyperactivity disorder, combined type: Secondary | ICD-10-CM | POA: Diagnosis not present

## 2022-07-17 DIAGNOSIS — F411 Generalized anxiety disorder: Secondary | ICD-10-CM | POA: Diagnosis not present

## 2022-07-24 ENCOUNTER — Other Ambulatory Visit (HOSPITAL_BASED_OUTPATIENT_CLINIC_OR_DEPARTMENT_OTHER): Payer: Self-pay

## 2022-07-24 DIAGNOSIS — F411 Generalized anxiety disorder: Secondary | ICD-10-CM | POA: Diagnosis not present

## 2022-07-24 DIAGNOSIS — F902 Attention-deficit hyperactivity disorder, combined type: Secondary | ICD-10-CM | POA: Diagnosis not present

## 2022-07-24 DIAGNOSIS — F3132 Bipolar disorder, current episode depressed, moderate: Secondary | ICD-10-CM | POA: Diagnosis not present

## 2022-07-24 MED ORDER — LISDEXAMFETAMINE DIMESYLATE 50 MG PO CAPS
ORAL_CAPSULE | Freq: Every day | ORAL | 0 refills | Status: DC
  Filled 2022-09-27: qty 30, 30d supply, fill #0

## 2022-07-24 MED ORDER — ALPRAZOLAM ER 3 MG PO TB24
3.0000 mg | ORAL_TABLET | Freq: Every day | ORAL | 0 refills | Status: DC
  Filled 2022-07-24 – 2022-08-03 (×3): qty 30, 30d supply, fill #0

## 2022-08-02 ENCOUNTER — Other Ambulatory Visit (HOSPITAL_BASED_OUTPATIENT_CLINIC_OR_DEPARTMENT_OTHER): Payer: Self-pay

## 2022-08-03 ENCOUNTER — Other Ambulatory Visit (HOSPITAL_BASED_OUTPATIENT_CLINIC_OR_DEPARTMENT_OTHER): Payer: Self-pay

## 2022-08-06 DIAGNOSIS — F902 Attention-deficit hyperactivity disorder, combined type: Secondary | ICD-10-CM | POA: Diagnosis not present

## 2022-08-06 DIAGNOSIS — F411 Generalized anxiety disorder: Secondary | ICD-10-CM | POA: Diagnosis not present

## 2022-08-06 DIAGNOSIS — F3132 Bipolar disorder, current episode depressed, moderate: Secondary | ICD-10-CM | POA: Diagnosis not present

## 2022-08-21 ENCOUNTER — Other Ambulatory Visit (HOSPITAL_BASED_OUTPATIENT_CLINIC_OR_DEPARTMENT_OTHER): Payer: Self-pay

## 2022-08-21 MED ORDER — ALPRAZOLAM ER 3 MG PO TB24
3.0000 mg | ORAL_TABLET | Freq: Every day | ORAL | 0 refills | Status: DC
  Filled 2022-08-21 – 2022-08-31 (×2): qty 30, 30d supply, fill #0

## 2022-08-30 ENCOUNTER — Other Ambulatory Visit (HOSPITAL_BASED_OUTPATIENT_CLINIC_OR_DEPARTMENT_OTHER): Payer: Self-pay

## 2022-08-30 MED ORDER — LISDEXAMFETAMINE DIMESYLATE 50 MG PO CAPS
50.0000 mg | ORAL_CAPSULE | Freq: Every day | ORAL | 0 refills | Status: DC
  Filled 2022-08-30 – 2022-08-31 (×2): qty 30, 30d supply, fill #0

## 2022-08-31 ENCOUNTER — Other Ambulatory Visit (HOSPITAL_BASED_OUTPATIENT_CLINIC_OR_DEPARTMENT_OTHER): Payer: Self-pay

## 2022-09-03 DIAGNOSIS — F3132 Bipolar disorder, current episode depressed, moderate: Secondary | ICD-10-CM | POA: Diagnosis not present

## 2022-09-03 DIAGNOSIS — F411 Generalized anxiety disorder: Secondary | ICD-10-CM | POA: Diagnosis not present

## 2022-09-03 DIAGNOSIS — F902 Attention-deficit hyperactivity disorder, combined type: Secondary | ICD-10-CM | POA: Diagnosis not present

## 2022-09-04 ENCOUNTER — Other Ambulatory Visit (HOSPITAL_BASED_OUTPATIENT_CLINIC_OR_DEPARTMENT_OTHER): Payer: Self-pay

## 2022-09-04 DIAGNOSIS — F3132 Bipolar disorder, current episode depressed, moderate: Secondary | ICD-10-CM | POA: Diagnosis not present

## 2022-09-04 DIAGNOSIS — F411 Generalized anxiety disorder: Secondary | ICD-10-CM | POA: Diagnosis not present

## 2022-09-04 DIAGNOSIS — F902 Attention-deficit hyperactivity disorder, combined type: Secondary | ICD-10-CM | POA: Diagnosis not present

## 2022-09-04 MED ORDER — ALPRAZOLAM ER 3 MG PO TB24
3.0000 mg | ORAL_TABLET | Freq: Every day | ORAL | 1 refills | Status: DC
  Filled 2022-09-04 – 2022-09-28 (×3): qty 30, 30d supply, fill #0
  Filled 2022-10-26 (×2): qty 30, 30d supply, fill #1

## 2022-09-17 DIAGNOSIS — F3132 Bipolar disorder, current episode depressed, moderate: Secondary | ICD-10-CM | POA: Diagnosis not present

## 2022-09-17 DIAGNOSIS — F411 Generalized anxiety disorder: Secondary | ICD-10-CM | POA: Diagnosis not present

## 2022-09-27 ENCOUNTER — Other Ambulatory Visit (HOSPITAL_BASED_OUTPATIENT_CLINIC_OR_DEPARTMENT_OTHER): Payer: Self-pay

## 2022-09-28 ENCOUNTER — Other Ambulatory Visit (HOSPITAL_BASED_OUTPATIENT_CLINIC_OR_DEPARTMENT_OTHER): Payer: Self-pay

## 2022-10-01 DIAGNOSIS — F411 Generalized anxiety disorder: Secondary | ICD-10-CM | POA: Diagnosis not present

## 2022-10-01 DIAGNOSIS — F902 Attention-deficit hyperactivity disorder, combined type: Secondary | ICD-10-CM | POA: Diagnosis not present

## 2022-10-01 DIAGNOSIS — F3132 Bipolar disorder, current episode depressed, moderate: Secondary | ICD-10-CM | POA: Diagnosis not present

## 2022-10-24 ENCOUNTER — Other Ambulatory Visit (HOSPITAL_BASED_OUTPATIENT_CLINIC_OR_DEPARTMENT_OTHER): Payer: Self-pay

## 2022-10-26 ENCOUNTER — Other Ambulatory Visit (HOSPITAL_BASED_OUTPATIENT_CLINIC_OR_DEPARTMENT_OTHER): Payer: Self-pay

## 2022-11-02 ENCOUNTER — Other Ambulatory Visit (HOSPITAL_BASED_OUTPATIENT_CLINIC_OR_DEPARTMENT_OTHER): Payer: Self-pay

## 2022-11-02 DIAGNOSIS — F411 Generalized anxiety disorder: Secondary | ICD-10-CM | POA: Diagnosis not present

## 2022-11-02 DIAGNOSIS — F3132 Bipolar disorder, current episode depressed, moderate: Secondary | ICD-10-CM | POA: Diagnosis not present

## 2022-11-02 DIAGNOSIS — F902 Attention-deficit hyperactivity disorder, combined type: Secondary | ICD-10-CM | POA: Diagnosis not present

## 2022-11-02 MED ORDER — ALPRAZOLAM ER 3 MG PO TB24
3.0000 mg | ORAL_TABLET | Freq: Every day | ORAL | 0 refills | Status: DC
  Filled 2022-11-23 (×2): qty 30, 30d supply, fill #0

## 2022-11-09 DIAGNOSIS — F902 Attention-deficit hyperactivity disorder, combined type: Secondary | ICD-10-CM | POA: Diagnosis not present

## 2022-11-09 DIAGNOSIS — F3132 Bipolar disorder, current episode depressed, moderate: Secondary | ICD-10-CM | POA: Diagnosis not present

## 2022-11-09 DIAGNOSIS — F411 Generalized anxiety disorder: Secondary | ICD-10-CM | POA: Diagnosis not present

## 2022-11-21 ENCOUNTER — Other Ambulatory Visit (HOSPITAL_BASED_OUTPATIENT_CLINIC_OR_DEPARTMENT_OTHER): Payer: Self-pay

## 2022-11-22 ENCOUNTER — Telehealth: Payer: Self-pay | Admitting: Family Medicine

## 2022-11-22 NOTE — Telephone Encounter (Signed)
LVM for patient to call back and schedule a Medicare Annual wellness visit and a follow up/ Physical with Dr. Charlett Blake.

## 2022-11-23 ENCOUNTER — Other Ambulatory Visit (HOSPITAL_COMMUNITY): Payer: Self-pay

## 2022-11-23 ENCOUNTER — Other Ambulatory Visit (HOSPITAL_BASED_OUTPATIENT_CLINIC_OR_DEPARTMENT_OTHER): Payer: Self-pay

## 2022-11-30 DIAGNOSIS — F411 Generalized anxiety disorder: Secondary | ICD-10-CM | POA: Diagnosis not present

## 2022-11-30 DIAGNOSIS — F902 Attention-deficit hyperactivity disorder, combined type: Secondary | ICD-10-CM | POA: Diagnosis not present

## 2022-11-30 DIAGNOSIS — F3132 Bipolar disorder, current episode depressed, moderate: Secondary | ICD-10-CM | POA: Diagnosis not present

## 2022-12-03 DIAGNOSIS — F902 Attention-deficit hyperactivity disorder, combined type: Secondary | ICD-10-CM | POA: Diagnosis not present

## 2022-12-03 DIAGNOSIS — F411 Generalized anxiety disorder: Secondary | ICD-10-CM | POA: Diagnosis not present

## 2022-12-03 DIAGNOSIS — Z79899 Other long term (current) drug therapy: Secondary | ICD-10-CM | POA: Diagnosis not present

## 2022-12-03 DIAGNOSIS — F3132 Bipolar disorder, current episode depressed, moderate: Secondary | ICD-10-CM | POA: Diagnosis not present

## 2022-12-06 ENCOUNTER — Other Ambulatory Visit: Payer: Self-pay

## 2022-12-06 ENCOUNTER — Encounter (HOSPITAL_COMMUNITY): Payer: Self-pay | Admitting: Emergency Medicine

## 2022-12-06 ENCOUNTER — Emergency Department (EMERGENCY_DEPARTMENT_HOSPITAL)
Admission: EM | Admit: 2022-12-06 | Discharge: 2022-12-08 | Disposition: A | Discharge status: Home / Self Care | Payer: Medicare Other | Source: Home / Self Care | Attending: Emergency Medicine | Admitting: Emergency Medicine

## 2022-12-06 DIAGNOSIS — Z1152 Encounter for screening for COVID-19: Secondary | ICD-10-CM | POA: Insufficient documentation

## 2022-12-06 DIAGNOSIS — T50902A Poisoning by unspecified drugs, medicaments and biological substances, intentional self-harm, initial encounter: Secondary | ICD-10-CM | POA: Diagnosis not present

## 2022-12-06 DIAGNOSIS — R9431 Abnormal electrocardiogram [ECG] [EKG]: Secondary | ICD-10-CM | POA: Diagnosis not present

## 2022-12-06 DIAGNOSIS — T6594XA Toxic effect of unspecified substance, undetermined, initial encounter: Secondary | ICD-10-CM

## 2022-12-06 DIAGNOSIS — T424X2A Poisoning by benzodiazepines, intentional self-harm, initial encounter: Secondary | ICD-10-CM | POA: Insufficient documentation

## 2022-12-06 DIAGNOSIS — F314 Bipolar disorder, current episode depressed, severe, without psychotic features: Secondary | ICD-10-CM | POA: Diagnosis not present

## 2022-12-06 DIAGNOSIS — T1491XA Suicide attempt, initial encounter: Secondary | ICD-10-CM

## 2022-12-06 DIAGNOSIS — R457 State of emotional shock and stress, unspecified: Secondary | ICD-10-CM | POA: Diagnosis not present

## 2022-12-06 LAB — CBC WITH DIFFERENTIAL/PLATELET
Abs Immature Granulocytes: 0.01 10*3/uL (ref 0.00–0.07)
Basophils Absolute: 0.1 10*3/uL (ref 0.0–0.1)
Basophils Relative: 1 %
Eosinophils Absolute: 0.2 10*3/uL (ref 0.0–0.5)
Eosinophils Relative: 3 %
HCT: 47 % (ref 39.0–52.0)
Hemoglobin: 16.1 g/dL (ref 13.0–17.0)
Immature Granulocytes: 0 %
Lymphocytes Relative: 21 %
Lymphs Abs: 1.6 10*3/uL (ref 0.7–4.0)
MCH: 29 pg (ref 26.0–34.0)
MCHC: 34.3 g/dL (ref 30.0–36.0)
MCV: 84.7 fL (ref 80.0–100.0)
Monocytes Absolute: 0.5 10*3/uL (ref 0.1–1.0)
Monocytes Relative: 6 %
Neutro Abs: 5.3 10*3/uL (ref 1.7–7.7)
Neutrophils Relative %: 69 %
Platelets: 280 10*3/uL (ref 150–400)
RBC: 5.55 MIL/uL (ref 4.22–5.81)
RDW: 12.8 % (ref 11.5–15.5)
WBC: 7.7 10*3/uL (ref 4.0–10.5)
nRBC: 0 % (ref 0.0–0.2)

## 2022-12-06 LAB — BASIC METABOLIC PANEL
Anion gap: 14 (ref 5–15)
BUN: 8 mg/dL (ref 6–20)
CO2: 25 mmol/L (ref 22–32)
Calcium: 9.6 mg/dL (ref 8.9–10.3)
Chloride: 95 mmol/L — ABNORMAL LOW (ref 98–111)
Creatinine, Ser: 0.9 mg/dL (ref 0.61–1.24)
GFR, Estimated: >60 mL/min (ref >60)
Glucose, Bld: 87 mg/dL (ref 70–99)
Potassium: 4 mmol/L (ref 3.5–5.1)
Sodium: 134 mmol/L — ABNORMAL LOW (ref 135–145)

## 2022-12-06 LAB — HEPATIC FUNCTION PANEL
ALT: 12 U/L (ref 0–44)
AST: 17 U/L (ref 15–41)
Albumin: 4.9 g/dL (ref 3.5–5.0)
Alkaline Phosphatase: 55 U/L (ref 38–126)
Bilirubin, Direct: 0.1 mg/dL (ref 0.0–0.2)
Indirect Bilirubin: 1 mg/dL — ABNORMAL HIGH (ref 0.3–0.9)
Total Bilirubin: 1.1 mg/dL (ref 0.3–1.2)
Total Protein: 8.3 g/dL — ABNORMAL HIGH (ref 6.5–8.1)

## 2022-12-06 LAB — LIPASE, BLOOD: Lipase: 43 U/L (ref 11–51)

## 2022-12-06 LAB — SARS CORONAVIRUS 2 BY RT PCR: SARS Coronavirus 2 by RT PCR: NEGATIVE

## 2022-12-06 NOTE — ED Provider Notes (Signed)
[facility] Provider Note   CSN: [id number] Arrival date & time: 12/06/22  2100     History Chief Complaint  Patient presents with   Ingestion    HPI Jesse Jones is a 42 y.o. male presenting for intentional Xanax overdose.  He endorses 12 mg of Xanax as an intentional overdose.Intentional overdose of 12mg  Xanax. Patient stated he understands that this overdose could be lethal. He states that he does not know why he did it. He states he would plan to do it again.  Patient has very poor capacity.  However he is in no acute distress.  GCS 15.  He cannot explain why he did it.    Patient's recorded medical, surgical, social, medication list and allergies were reviewed in the Snapshot window as part of the initial history.   Review of Systems   Review of Systems  Constitutional:  Negative for chills and fever.  HENT:  Negative for ear pain and sore throat.   Eyes:  Negative for pain and visual disturbance.  Respiratory:  Negative for cough and shortness of breath.   Cardiovascular:  Negative for chest pain and palpitations.  Gastrointestinal:  Negative for abdominal pain and vomiting.  Genitourinary:  Negative for dysuria and hematuria.  Musculoskeletal:  Negative for arthralgias and back pain.  Skin:  Negative for color change and rash.  Neurological:  Negative for seizures and syncope.  Psychiatric/Behavioral:  Positive for confusion.   All other systems reviewed and are negative.   Physical Exam Updated Vital Signs BP 130/88   Pulse 88   Temp 97.9 F (36.6 C) (Oral)   Resp 16   Ht 6\' 2"  (1.88 m)   Wt 89 kg   SpO2 100%   BMI 25.19 kg/m  Physical Exam Vitals and nursing note reviewed.  Constitutional:      General: He is not in acute distress.    Appearance: He is well-developed.  HENT:     Head: Normocephalic and atraumatic.  Eyes:     Conjunctiva/sclera: Conjunctivae normal.  Cardiovascular:     Rate and Rhythm:  Normal rate and regular rhythm.     Heart sounds: No murmur heard. Pulmonary:     Effort: Pulmonary effort is normal. No respiratory distress.     Breath sounds: Normal breath sounds.  Abdominal:     Palpations: Abdomen is soft.     Tenderness: There is no abdominal tenderness.  Musculoskeletal:        General: No swelling.     Cervical back: Neck supple.  Skin:    General: Skin is warm and dry.     Capillary Refill: Capillary refill takes less than 2 seconds.  Neurological:     Mental Status: He is alert.  Psychiatric:        Mood and Affect: Mood normal.      ED Course/ Medical Decision Making/ A&P    Procedures Procedures   Medications Ordered in ED Medications - No data to display Medical Decision Making:   Jesse Jones is a 42 y.o. male who presented to the ED today for psychiatric evaluation.  Patient is endorsing intentional overdose.  Patient does have a history of multiple comorbid psychiatric conditions for which they are on multiple medications.  They are compliant with their medications.  On my initial exam, the pt was linear in thought, flat in affect, and overall well-appearing.  Vital signs reviewed and reassuring.  Reviewed and confirmed nursing  documentation for past medical history, family history, social history.   Patient's presentation is complicated by their history of multiple comorbid medical problems.  Patient placed on continuous vitals and telemetry monitoring while in ED which was reviewed periodically.     Initial Assessment:   Concerning patient's overdose.  He is currently GCS 15, approximately 6 hours since his overdose and appears well.  He is medically cleared for psychiatric disposition. This is most consistent with an acute life threatening illness. With the patient's presentation of intentional drug overdose with lethal intention, patient warrants emergent psychiatric consultation.  Differential includes primary psychosis,  substance-induced psychosis, mood disturbance.  Initial Plan:  Patient immediately placed into ED psychiatric hold protocol including suicide precautions, elopement precautions and vital sign monitoring.    Emergent behavioral health hold signed and notarized while awaiting psychiatric consultation due to threat to self or others.  Psychiatry consulted for further evaluation once patient medically cleared.  Medical screening evaluation ordered and reviewed with no obvious medical reason to postpone psychiatric evaluation.  Patient was IVC'd per EDP due to concern for overdose and need for psychiatric evaluation due to lack of insight.  May need to be reassessed if capacity is changing.    Patient placed into psychiatric observation protocol pending psychiatric recommendations at this time.   Clinical Impression:  1. Suicide attempt ([place])   2. Ingestion of substance, undetermined intent, initial encounter      Data Unavailable   Final Clinical Impression(s) / ED Diagnoses Final diagnoses:  Suicide attempt ([place])  Ingestion of substance, undetermined intent, initial encounter    Rx / DC Orders ED Discharge Orders     None         Tretha Sciara, MD 12/06/22 2235

## 2022-12-06 NOTE — ED Notes (Signed)
Patient belongings is at the 23-25 and HALL C cabinets.

## 2022-12-06 NOTE — ED Triage Notes (Addendum)
Patient c/o possible overdose. Per report pt possible took 12 mg of xanax at [facility]. Per report pt took whatever on his daily pill box which is total of 12 mg xanax. Per report his whole pill bottles is with his parents. Pt c/o being sleepy. Pt denies N/V. Patient denies SI/HI.  BP 118/82 HR 94 RR 20  O2sat 99% on RA

## 2022-12-06 NOTE — BH Assessment (Signed)
Clinician messaged Junior Jones Bales. [place], RN: "Hey. It's Trey with TTS. Is the pt able to engage in the assessment, if so the pt will need to be placed in a private room. Can you fax the IVC to [phone]? Also is the pt medically cleared?"   Clinician awaiting response.    Vertell Novak, [place], [facility], [facility] Triage Specialist [phone]

## 2022-12-07 DIAGNOSIS — T50902A Poisoning by unspecified drugs, medicaments and biological substances, intentional self-harm, initial encounter: Secondary | ICD-10-CM | POA: Diagnosis not present

## 2022-12-07 LAB — RESP PANEL BY RT-PCR (RSV, FLU A&B, COVID)  RVPGX2
Influenza A by PCR: NEGATIVE
Influenza B by PCR: NEGATIVE
Resp Syncytial Virus by PCR: NEGATIVE
SARS Coronavirus 2 by RT PCR: NEGATIVE

## 2022-12-07 MED ORDER — ESCITALOPRAM OXALATE 10 MG PO TABS
20.0000 mg | ORAL_TABLET | Freq: Every day | ORAL | Status: DC
  Administered 2022-12-07 – 2022-12-08 (×2): 20 mg via ORAL
  Filled 2022-12-07 (×2): qty 2

## 2022-12-07 MED ORDER — AMPHETAMINE-DEXTROAMPHET ER 10 MG PO CP24
30.0000 mg | ORAL_CAPSULE | Freq: Once | ORAL | Status: AC
  Administered 2022-12-07: 30 mg via ORAL
  Filled 2022-12-07: qty 1

## 2022-12-07 MED ORDER — LISDEXAMFETAMINE DIMESYLATE 50 MG PO CAPS: 50.0000 mg | ORAL_CAPSULE | Freq: Every day | ORAL | Status: DC

## 2022-12-07 MED ORDER — DIPHENHYDRAMINE HCL 25 MG PO CAPS
25.0000 mg | ORAL_CAPSULE | Freq: Once | ORAL | Status: DC
  Filled 2022-12-07: qty 1

## 2022-12-07 MED ORDER — OLANZAPINE 5 MG PO TABS
15.0000 mg | ORAL_TABLET | Freq: Every day | ORAL | Status: DC
  Administered 2022-12-07: 15 mg via ORAL
  Filled 2022-12-07: qty 1

## 2022-12-07 MED ORDER — FLUTICASONE PROPIONATE 50 MCG/ACT NA SUSP
2.0000 | Freq: Every day | NASAL | Status: DC | PRN
  Administered 2022-12-07: 2 via NASAL
  Filled 2022-12-07: qty 16

## 2022-12-07 MED ORDER — BUSPIRONE HCL 10 MG PO TABS
5.0000 mg | ORAL_TABLET | Freq: Two times a day (BID) | ORAL | Status: DC | PRN
  Administered 2022-12-07 – 2022-12-08 (×3): 5 mg via ORAL
  Filled 2022-12-07 (×3): qty 1

## 2022-12-07 MED ORDER — AMPHETAMINE-DEXTROAMPHET ER 10 MG PO CP24
30.0000 mg | ORAL_CAPSULE | Freq: Every morning | ORAL | Status: DC
  Administered 2022-12-08: 30 mg via ORAL
  Filled 2022-12-07: qty 3

## 2022-12-07 NOTE — ED Provider Notes (Signed)
Emergency Medicine Observation Re-evaluation Note  Jesse Jones is a 42 y.o. male, seen on rounds today.  Pt initially presented to the ED for complaints of Ingestion Currently, the patient is sleeping.  Physical Exam  BP 98/64 (BP Location: Right Arm)   Pulse 96   Temp 98.6 F (37 C) (Oral)   Resp 20   Ht 6' 2"$  (1.88 m)   Wt 89 kg   SpO2 94%   BMI 25.19 kg/m  Physical Exam General: No distress Cardiac: Well-perfused Lungs: No increased work of breathing Psych: Calm  ED Course / MDM  EKG:EKG Interpretation  Date/Time:  Thursday December 06 2022 21:10:52 EST Ventricular Rate:  90 PR Interval:  132 QRS Duration: 104 QT Interval:  379 QTC Calculation: 464 R Axis:   84 Text Interpretation: Sinus rhythm Confirmed by Tretha Sciara [phone]) on 12/06/2022 10:30:49 PM  I have reviewed the labs performed to date as well as medications administered while in observation.  Recent changes in the last 24 hours include TTS evaluation.  Plan  Current plan is for awaiting psychiatric placement.    Ezequiel Essex, MD 12/07/22 [phone]

## 2022-12-07 NOTE — Consult Note (Signed)
[place] ED ASSESSMENT   Reason for Consult:  Psych Consult Referring Physician:  Dr. Oswald Hillock  Patient Identification: Jesse Jones MRN:  [id number] ED Chief Complaint: Intentional overdose [facility])  Diagnosis:  Principal Problem:   Intentional overdose [facility])   ED Assessment Time Calculation: Start Time: 1020 Stop Time: 1100 Total Time in Minutes (Assessment Completion): 40   HPI:  Per Triage Note "Patient c/o possible overdose. Per report pt possible took 12 mg of xanax at [facility]. Per report pt took whatever on his daily pill box which is total of 12 mg xanax. Per report his whole pill bottles is with his parents. Pt c/o being sleepy. Pt denies N/V. Patient denies SI/HI."    Subjective:  Jesse Jones, 42 y.o., male patient seen face to face by this provider, consulted with Dr. Dwyane Dee; and chart reviewed on 12/07/22.  On evaluation Jesse Jones reports that he got fed up with having to take medicine, he says his parents frustrated him about his medication and they got into an argument and so "I took it all."  When provider asked him what all meant he said he took 3 olanzapine, and 1 Xanax at this time he is unsure how many milligrams of each medications were.  Patient does not know why he did it, and understands how dangerous it could have been.  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 Patient denies SI/HI/AVH.  Patient said his appetite and sleep are poor due to unknown anxiety.  Says he has a history of depression and anxiety and bipolar, able to tell provider he takes Xanax, Zyprexa, and Lexapro.  Patient says that he does not abuse any illicit drugs or alcohol he is a recovering alcoholic and has not had a drink in 8 years.  During evaluation Jesse Jones is sitting in conference room with provider in no acute distress. He is alert, oriented x 4, mildly anxious, cooperative and attentive.  His mood mood is anxious with congruent affect, as he is continuously tapping his leg, pupils are dilated and he said "they get that way when I am nervous ".  Provider asked what was making him nervous we were just talking and he said I have anxiety disorder. He has normal speech, and behavior.  Objectively there is no evidence of psychosis/mania or delusional thinking.  He denies suicidal/self-harm/homicidal ideation, psychosis, and paranoia.  Patient denies SI and self-harm, but then states he would do it again, patient has limited insight into behavior and very poor capacity.  Patient unable to explain why he took the medications, and are not able to explain any stress he may be having.  Spoke with patient's father Jesse Jones with patient's permission, he stated he did not understand why patient attempted to overdose on his medications, says that he and his wife try and give patient as much freedom as possible, but also trying to stay on top of patient compliance with medication.  Says that patient has been to behavioral health Hospital in his late teens due to detox for drugs.  Says that he and his wife are very supportive of patient.  Past Psychiatric History: Anxiety, depression, bipolar  Risk to Self or Others: Is the patient at risk to  self? Yes Has the patient been a risk to self in the past 6 months? No Has the patient been a risk to self within the distant past? No Is the patient a risk to others? No Has the patient been a risk to others in the past 6 months? No Has the patient been a risk to others within the distant past? No  [place] Scale:  [place] ED from 12/06/2022 in [facility] Emergency Department at [place] [facility] from 03/02/2022 in [facility] Primary Care at [facility] Video Visit from  02/27/2022 in [facility] Primary Care at [place] High Risk High Risk High Risk       AIMS:  , , ,  ,   ASAM: ASAM Multidimensional Assessment Summary Dimension 1:  Description of individual's past and current experiences of substance use and withdrawal: Pt denies substance use. DImension 1:  Acute Intoxication and/or Withdrawal Potential Severity Rating: None Dimension 2:  Description of patient's biomedical conditions and  complications: Pt denies substance use. Dimension 2:  Biomedical Conditions and Complications Severity Rating: None Dimension 3:  Description of emotional, behavioral, or cognitive conditions and complications: Pt denies substance use. Dimension 3:  Emotional, behavioral or cognitive (EBC) conditions and complications severity rating: None Dimension 4:  Description of Readiness to Change criteria: Pt denies substance use. Dimension 4:  Readiness to Change Severity Rating: None Dimension 5:  Relapse, continued use, or continued problem potential critiera description: Pt denies substance use. Dimension 5:  Relapse, continued use, or continued problem potential severity rating: None Dimension 6:  Recovery/Iiving environment criteria description: Pt denies substance use. Dimension 6:  Recovery/living environment severity rating: None ASAM's Severity Rating Score: 0 ASAM Recommended Level of Treatment:  (Pt denies substance use.)  Substance Abuse:  Alcohol / Drug Use Pain Medications: See MAR Prescriptions: See MAR Over the Counter: See MAR History of alcohol / drug use?: Yes Longest period of sobriety (when/how long): Pt is unsure. 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   Outbursts of anger    Preventative health care 10/03/2011   Tobacco abuse 04/28/2011    Past Surgical History:  Procedure Laterality Date   NASAL SEPTUM SURGERY Bilateral    Dr Redmond Baseman 2017   punctured lung     toe surgeries  during childhood    for ingrown toenails   Family History:  Family History  Problem Relation Age of Onset   Depression Mother    Anxiety disorder Mother    Depression Brother    Diabetes Brother        type 1   Cancer Paternal Grandmother        lung/ didn't smoke   Cancer Maternal Uncle 66       colon cancer    Social History:  Social History   Substance and Sexual Activity  Alcohol Use Yes   Alcohol/week: 36.0 standard drinks of alcohol  Types: 36 Cans of beer per week   Comment: sober x15yr No alcohol.     Social History   Substance and Sexual Activity  Drug Use Yes   Types: Marijuana, Other-see comments   Comment: pt stopped 4 year ago    Social History   Socioeconomic History   Marital status: Single    Spouse name: Not on file   Number of children: 0   Years of education: 12   Highest education level: 12th grade  Occupational History   Not on file  Tobacco Use   Smoking status: Former    Packs/day: 0.30    Years: 20.00    Total pack years: 6.00    Types: Cigarettes    Passive exposure: Current   Smokeless tobacco: Never   Tobacco comments:    Discussed Smoking Cessation and Alcohol and Drug Services  Vaping Use   Vaping Use: Never used  Substance and Sexual Activity   Alcohol use: Yes    Alcohol/week: 36.0 standard drinks of alcohol    Types: 36 Cans of beer per week    Comment: sober x416yrNo alcohol.   Drug use: Yes    Types: Marijuana, Other-see comments    Comment: pt stopped 4 year  ago   Sexual activity: Yes    Partners: Female    Comment: lives alone and eating well. exercising  Other Topics Concern   Not on file  Social History Narrative   Not on file   Social Determinants of Health   Financial Resource Strain: Low Risk  (03/02/2022)   Overall Financial Resource Strain (CARDIA)    Difficulty of Paying Living Expenses: Not hard at all  Food Insecurity: No Food Insecurity (03/02/2022)   Hunger Vital Sign    Worried About Running Out of Food in the Last Year: Never true    Ran Out of Food in the Last Year: Never true  Transportation Needs: No Transportation Needs (03/02/2022)   PRAPARE - TrHydrologistMedical): No    Lack of Transportation (Non-Medical): No  Physical Activity: Inactive (03/02/2022)   Exercise Vital Sign    Days of Exercise per Week: 0 days    Minutes of Exercise per Session: 0 min  Stress: Stress Concern Present (03/02/2022)   FiValeria  Feeling of Stress : Very much  Social Connections: Socially Isolated (03/02/2022)   Social Connection and Isolation Panel [NHANES]    Frequency of Communication with Friends and Family: More than three times a week    Frequency of Social Gatherings with Friends and Family: More than three times a week    Attends Religious Services: Never    AcMarine scientistr Organizations: No    Attends ClArchivisteetings: Never    Marital Status: Never married     Allergies:   Allergies  Allergen Reactions   Haloperidol Decanoate    Hydroxyzine Anxiety    EXCESSIVE ANXIOUNESS   Other Other (See Comments)    Reverse effect, super hyper   Penicillins Hives   Valium     Labs:  Results for orders placed or performed during the hospital encounter of 12/06/22 (from the past 48 hour(s))  CBC with Differential     Status: None   Collection Time: 12/06/22  9:59 PM  Result Value Ref Range   WBC 7.7 4.0 - 10.5  K/uL   RBC 5.55 4.22 - 5.81  MIL/uL   Hemoglobin 16.1 13.0 - 17.0 g/dL   HCT 47.0 39.0 - 52.0 %   MCV 84.7 80.0 - 100.0 fL   MCH 29.0 26.0 - 34.0 pg   MCHC 34.3 30.0 - 36.0 g/dL   RDW 12.8 11.5 - 15.5 %   Platelets 280 150 - 400 K/uL   nRBC 0.0 0.0 - 0.2 %   Neutrophils Relative % 69 %   Neutro Abs 5.3 1.7 - 7.7 K/uL   Lymphocytes Relative 21 %   Lymphs Abs 1.6 0.7 - 4.0 K/uL   Monocytes Relative 6 %   Monocytes Absolute 0.5 0.1 - 1.0 K/uL   Eosinophils Relative 3 %   Eosinophils Absolute 0.2 0.0 - 0.5 K/uL   Basophils Relative 1 %   Basophils Absolute 0.1 0.0 - 0.1 K/uL   Immature Granulocytes 0 %   Abs Immature Granulocytes 0.01 0.00 - 0.07 K/uL    Comment: Performed at [facility], [place] [address], [place], [place] [id number]  Basic metabolic panel     Status: Abnormal   Collection Time: 12/06/22  9:59 PM  Result Value Ref Range   Sodium 134 (L) 135 - 145 mmol/L   Potassium 4.0 3.5 - 5.1 mmol/L   Chloride 95 (L) 98 - 111 mmol/L   CO2 25 22 - 32 mmol/L   Glucose, Bld 87 70 - 99 mg/dL    Comment: Glucose reference range applies only to samples taken after fasting for at least 8 hours.   BUN 8 6 - 20 mg/dL   Creatinine, Ser 0.90 0.61 - 1.24 mg/dL   Calcium 9.6 8.9 - 10.3 mg/dL   GFR, Estimated >60 >60 mL/min    Comment: (NOTE) Calculated using the CKD-EPI Creatinine Equation (2021)    Anion gap 14 5 - 15    Comment: Performed at [facility], [place] [address] [place], [place] [address]  Lipase, blood     Status: None   Collection Time: 12/06/22  9:59 PM  Result Value Ref Range   Lipase 43 11 - 51 U/L    Comment: Performed at [facility], [place] [address] [place], [place] [address]  Hepatic function panel     Status: Abnormal   Collection Time: 12/06/22  9:59 PM  Result Value Ref Range   Total Protein 8.3 (H) 6.5 - 8.1 g/dL   Albumin 4.9 3.5 - 5.0 g/dL   AST 17 15 - 41 U/L   ALT 12 0 - 44 U/L   Alkaline  Phosphatase 55 38 - 126 U/L   Total Bilirubin 1.1 0.3 - 1.2 mg/dL   Bilirubin, Direct 0.1 0.0 - 0.2 mg/dL   Indirect Bilirubin 1.0 (H) 0.3 - 0.9 mg/dL    Comment: Performed at [facility], [place] [address], [place], [place] [address]  SARS Coronavirus 2 by RT PCR (hospital order, performed in [facility] hospital lab) *cepheid single result test* Anterior Nasal Swab     Status: None   Collection Time: 12/06/22 10:25 PM   Specimen: Anterior Nasal Swab  Result Value Ref Range   SARS Coronavirus 2 by RT PCR NEGATIVE NEGATIVE    Comment: (NOTE) SARS-CoV-2 target nucleic acids are NOT DETECTED.  The SARS-CoV-2 RNA is generally detectable in upper and lower respiratory specimens during the acute phase of infection. The lowest concentration of SARS-CoV-2 viral copies this assay can detect is 250 copies / mL. A negative result does not preclude SARS-CoV-2 infection and  should not be used as the sole basis for treatment or other patient management decisions.  A negative result may occur with improper specimen collection / handling, submission of specimen other than nasopharyngeal swab, presence of viral mutation(s) within the areas targeted by this assay, and inadequate number of viral copies (<250 copies / mL). A negative result must be combined with clinical observations, patient history, and epidemiological information.  Fact Sheet for Patients:   https://www.patel.info/  Fact Sheet for Healthcare Providers: https://hall.com/  This test is not yet approved or  cleared by the [place] FDA and has been authorized for detection and/or diagnosis of SARS-CoV-2 by FDA under an Emergency Use Authorization (EUA).  This EUA will remain in effect (meaning this test can be used) for the duration of the COVID-19 declaration under Section 564(b)(1) of the Act, 21 U.S.C. section 360bbb-3(b)(1), unless the authorization is terminated  or revoked sooner.  Performed at [facility], [place] [address], [place], [place] [address]     Current Facility-Administered Medications  Medication Dose Route Frequency Provider Last Rate Last Admin   amphetamine-dextroamphetamine (ADDERALL XR) 24 hr capsule 30 mg  30 mg Oral q morning Rancour, Annie Main, MD       busPIRone (BUSPAR) tablet 5 mg  5 mg Oral BID PRN Motley-Mangrum, Lumir Demetriou A, PMHNP       diphenhydrAMINE (BENADRYL) capsule 25 mg  25 mg Oral Once Motley-Mangrum, Termaine Roupp A, PMHNP       escitalopram (LEXAPRO) tablet 20 mg  20 mg Oral Daily Rancour, Stephen, MD   20 mg at 12/07/22 1111   fluticasone (FLONASE) 50 MCG/ACT nasal spray 2 spray  2 spray Each Nare Daily PRN Rancour, Stephen, MD       OLANZapine (ZYPREXA) tablet 15 mg  15 mg Oral QHS Rancour, Stephen, MD       Current Outpatient Medications  Medication Sig Dispense Refill   ALPRAZolam (XANAX XR) 3 MG 24 hr tablet Take 1 tablet (3 mg total) by mouth daily. 30 tablet 0   amphetamine-dextroamphetamine (ADDERALL XR) 30 MG 24 hr capsule Take 30 mg by mouth every morning.     cholecalciferol (VITAMIN D) 1000 UNITS tablet Take 1,000 Units by mouth every morning.     escitalopram (LEXAPRO) 20 MG tablet Take 1 tablet daily 30 tablet 1   fish oil-omega-3 fatty acids 1000 MG capsule Take 2 g by mouth daily.     fluticasone (FLONASE) 50 MCG/ACT nasal spray SPRAY 2 SPRAYS INTO BOTH NOSTRILS DAILY AS NEEDED FOR ALLERGIES OR RHINITIS. (Patient taking differently: Place 2 sprays into both nostrils daily as needed for allergies.) 48 mL 2   Multiple Vitamin (MULTIVITAMIN) tablet Take 1 tablet by mouth every morning.     OLANZapine (ZYPREXA) 15 MG tablet Take 15 mg by mouth at bedtime.     vitamin B-12 (CYANOCOBALAMIN) 1000 MCG tablet Take 1,000 mcg by mouth every morning.     vitamin C (ASCORBIC ACID) 500 MG tablet Take 500 mg by mouth daily.     lisdexamfetamine (VYVANSE) 50 MG capsule TAKE 1 CAPSULE BY MOUTH EVERY DAY  (Patient not taking: Reported on 12/07/2022) 30 capsule 0   lisdexamfetamine (VYVANSE) 50 MG capsule Take 1 capsule (50 mg total) by mouth daily. (Patient not taking: Reported on 12/07/2022) 30 capsule 0    Musculoskeletal: Strength & Muscle Tone: within normal limits Gait & Station: normal Patient leans: N/A   Psychiatric Specialty Exam: Presentation  General Appearance:  Appropriate for Environment  Eye  Contact: Fair  Speech: Clear and Coherent  Speech Volume: Normal  Handedness: Right   Mood and Affect  Mood: Anxious  Affect: Flat   Thought Process  Thought Processes: Disorganized  Descriptions of Associations:Loose  Orientation:Full (Time, Place and Person)  Thought Content:Illogical  History of Schizophrenia/Schizoaffective disorder:No  Duration of Psychotic Symptoms:No data recorded Hallucinations:Hallucinations: Visual Description of Visual Hallucinations: sees patterns  Ideas of Reference:None  Suicidal Thoughts:Suicidal Thoughts: Yes, Passive  Homicidal Thoughts:Homicidal Thoughts: No   Sensorium  Memory: Recent Good; Immediate Good; Remote Fair  Judgment: Poor  Insight: Fair   Materials engineer: Fair  Attention Span: Fair  Recall: [employer] of Knowledge: Fair  Language: Fair   Psychomotor Activity  Psychomotor Activity: Psychomotor Activity: Normal   Assets  Assets: Armed forces logistics/support/administrative officer; Financial Resources/Insurance; Housing; Social Support    Sleep  Sleep: Sleep: Poor   Physical Exam: Physical Exam Vitals and nursing note reviewed. Exam conducted with a chaperone present.  HENT:     Nose: Nose normal.  Eyes:     Comments: Pupils dilated  Pulmonary:     Effort: Pulmonary effort is normal.  Musculoskeletal:        General: Normal range of motion.  Neurological:     Mental Status: He is alert.  Psychiatric:        Attention and Perception: Attention normal.        Mood and  Affect: Mood is anxious.        Speech: Speech normal.        Behavior: Behavior is cooperative.        Thought Content: Thought content includes suicidal ideation.        Cognition and Memory: Memory normal.        Judgment: Judgment is inappropriate.    Review of Systems  Constitutional: Negative.   HENT: Negative.    Respiratory: Negative.    Musculoskeletal: Negative.   Psychiatric/Behavioral:  Positive for depression and suicidal ideas. The patient is nervous/anxious.    Blood pressure 98/64, pulse 96, temperature 98.6 F (37 C), temperature source Oral, resp. rate 20, height 6' 2"$  (1.88 m), weight 89 kg, SpO2 94 %. Body mass index is 25.19 kg/m.  Medical Decision Making: Recommend psychiatric inpatient treatment.  Started patient on BuSpar 5 mg twice daily for anxiety.  Will consult with social worker for bed placement.   Disposition: Recommend psychiatric Inpatient admission when medically cleared.  Taneesha Edgin MOTLEY-MANGRUM, PMHNP 12/07/2022 1:18 PM

## 2022-12-07 NOTE — Progress Notes (Signed)
Per Night [facility] AC Clayborne Dana, RN pt can transfer to [facility] 12/08/22 after 9am. This CSW will be working tomorrow and will follow up with coordinating with [place] and care team.    Benjaman Kindler, MSW, [facility] 12/07/2022 9:32 PM

## 2022-12-07 NOTE — Progress Notes (Addendum)
Pt was accepted to [facility] 12/07/22; Bed Assignment [id number] Negative COVID-19 PCR  Pt meets inpatient criteria per Michaele Offer, [place]  Attending Physician will be Dr. Janine Limbo  Report can be called to: - Adult unit: [phone]  Pt can arrive after: PENDING Items; Night [facility] AC will assist and coordinate with care team.  Care Team notified: [facility] Scharlene Gloss, RN, [place], PMHNP, Adelene Idler, Paramedic, [place], [place], Night [place], [place], [place] 12/07/2022 @ 5:37 PM

## 2022-12-07 NOTE — BH Assessment (Signed)
Comprehensive Clinical Assessment (CCA) Note  12/07/2022 Jesse Jones [id number]  Disposition: Jesse Georges, NP recommends inpatient treatment. Jesse Pacini, RN pt to be reviewed after discharges. If no beds available CSW to seek placement. Disposition discussed with Dr. Tretha Sciara and Jesse Grove M. Rimando, RN via secure chat.   Jesse Jones ED from 12/06/2022 in [facility] Emergency Department at [place] Management from 03/02/2022 in [facility] Primary Care at [facility] Video Visit from 02/27/2022 in [facility] Primary Care at [place] High Risk High Risk High Risk      The patient demonstrates the following risk factors for suicide: Chronic risk factors for suicide include: psychiatric disorder of Major Depressive Disorder, recurrent, severe with psychotic features and previous suicide attempts Per chart, pt had an intentional overdose . Acute risk factors for suicide include: social withdrawal/isolation and Pt denies, SI . Protective factors for this patient include:  Pt currently denies SI . Considering these factors, the overall suicide risk at this point appears to be high. Patient is not appropriate for outpatient follow up.  Jesse Jones is a 42 year old male who presents involuntary and unaccompanied to [facility]. Clinician asked the pt, "what brought you to the hospital?" Pt reports, he took his medication wrong. Pt also reports, he took the rest of what he had. Pt then reports, he took one Xanax and three Alprazolam tablets. Per pt, his parents was suppose to take his Xanax until Sunday but he was scared to go through DT's. Pt reports, he wants to get off of Xanax but he hasn't talked to his psychiatric provider about it. Pt reports, earlier today he felt like he wanted to die. Pt denies, having a plan. Per pt, he feels like people are out to get him and that he's being watched. Pt reports, he has a  pocket knife. Pt denies, current SI, HI, self-injurious behavior.  Pt was IVC'd by EDP. Per IVC paperwork: "Intentional overdose of 12 mg Xanax. Patient stated he understands that this overdose could be fatal. He states that he does not know why he did it. He states he would plan to [facility] it again."   Pt reports, he's linked to Jesse Jones for medication management and [name] for therapy. Pt reports, he's not sure the name of the agency of his outpatient providers. Pt reports, he's also prescribed Adderall. Pt reports, he's going to teel Jesse Jones he wants to get off Xanax. Pt reports, he takes his medications when he feels like it. Pt reports, he was at [employer] for alcohol use but he's not sure when.   Pt presents quiet, awake in scrubs with normal speech. Pt's mood was depressed, anxious. Pt's affect was flat. Pt's insight was lacking. Pt's judgement was poor. Pt reports. Pt reports, he doesn't feel safe being discharged. Pt reports, he feels he needs to detox from Xanax.   Diagnosis: Major Depressive Disorder, recurrent, severe with psychotic features.   *Pt denies, having supports.*  Chief Complaint:  Chief Complaint  Patient presents with   Ingestion   Visit Diagnosis:     CCA Screening, Triage and Referral (STR)  Patient Reported Information How did you hear about [place]? Legal System  What Is the Reason for Your Visit/Call Today? Pt reports, taking one Xanax and three Alprazolam (he takes for Bipolar) because he was scared to go through DT's. Pt reports, his parents held his Xanax until Sunday but  he just took them. Pt reports, earlier today he felt like he wanted to die. Per pt, he feels like people are out to get him and that he's being watched. Pt reports, he has a pocket knife. Pt denies, current SI, HI, self-injurious behaviors. Pt reports, he doesn't feel safe being discharged.  How Long Has This Been Causing You Problems? <Week  What Do You Feel Would Help You the Most Today?  Treatment for Depression or other mood problem; Stress Management; Medication(s)   Have You Recently Had Any Thoughts About Hurting Yourself? Yes  Are You Planning to Commit Suicide/Harm Yourself At This time? No   Flowsheet Row ED from 12/06/2022 in [facility] Emergency Department at [place] Management from 03/02/2022 in [facility] Primary Care at [facility] Video Visit from 02/27/2022 in [facility] Primary Care at [employer] High Risk High Risk High Risk       Have you Recently Had Thoughts About [place]? No  Are You Planning to Harm Someone at This Time? No  Explanation: Pt denies, HI.   Have You Used Any Alcohol or Drugs in the Past 24 Hours? No  What Did You Use and How Much? Pt denies, substance use.   Do You Currently Have a Therapist/Psychiatrist? Yes  Name of Therapist/Psychiatrist: Name of Therapist/Psychiatrist: Pt reports, he's linked to Jesse Jones for medication management and [name] for therapy. Pt reports, he's not sure the name of the agency of his outpatient providers.   Have You Been Recently Discharged From Any Office Practice or Programs? No  Explanation of Discharge From Practice/Program: None.     CCA Screening Triage Referral Assessment Type of Contact: Tele-Assessment  Telemedicine Service Delivery: Telemedicine service delivery: This service was provided via telemedicine using a 2-way, interactive audio and video technology  Is this Initial or Reassessment? Is this Initial or Reassessment?: Initial Assessment  Date Telepsych consult ordered in CHL:  Date Telepsych consult ordered in CHL: 12/06/22  Time Telepsych consult ordered in CHL:  Time Telepsych consult ordered in CHL: 2130  Location of Assessment: [facility] ED  Provider Location: [facility] Assessment Services   Collateral Involvement: Pt denies, having supports.   Does Patient Have a Air cabin crew Guardian? No  Legal Guardian Contact Information: Pt is his own guardian.  Copy of Legal Guardianship Form: -- (Pt is his own guardian.)  Legal Guardian Notified of Arrival: -- (Pt is his own guardian.)  Legal Guardian Notified of Pending Discharge: -- (Pt is his own guardian.)  If Minor and Not Living with Parent(s), Who has Custody? Pt is his own guardian.  Is CPS involved or ever been involved? Never  Is APS involved or ever been involved? Never   Patient Determined To Be At Risk for Harm To Self or Others Based on Review of Patient Reported Information or Presenting Complaint? Yes, for Self-Harm  Method: Plan with intent and identified person (Pt took 12 mg of Xanax.)  Availability of Means: Has close by  Intent: Intends to cause physical harm but not necessarily death  Notification Required: No need or identified person  Additional Information for Danger to Others Potential: -- (Pt denies, HI.)  Additional Comments for Danger to Others Potential: Pt denies, HI.  Are There Guns or Other Weapons in [place]? No  Types of Guns/Weapons: Pt denies, access to guns.  Are These Weapons Safely Secured?                            -- (  Pt denies, access to guns.)  Who Could Verify You Are Able To Have These Secured: Pt denies, access to guns.  Do You Have any Outstanding Charges, Pending Court Dates, Parole/Probation? Pt denies, legal involvement.  Contacted To Inform of Risk of Harm To Self or Others: -- (Pt denies, SI/HI.)    Does Patient Present under Involuntary Commitment? Yes    [place] of Residence: [place]   Patient Currently Receiving the Following Services: Individual Therapy; Medication Management   Determination of Need: Emergent (2 hours)   Options For Referral: Inpatient Hospitalization; Medication Management; Outpatient Therapy     CCA Biopsychosocial Patient Reported Schizophrenia/Schizoaffective Diagnosis in Past: No   Strengths: Pt  reports, his mother told him to call 911 after he took his medication.   Mental Health Symptoms Depression:   Fatigue; Sleep (too much or little); Hopelessness; Worthlessness; Irritability; Difficulty Concentrating   Duration of Depressive symptoms:  Duration of Depressive Symptoms: Greater than two weeks   Mania:   None   Anxiety:    Worrying; Tension; Restlessness; Irritability; Fatigue; Difficulty concentrating; Sleep (Panic attacks.)   Psychosis:   -- (Paranoia.)   Duration of Psychotic symptoms:    Trauma:   None   Obsessions:   None   Compulsions:   None   Inattention:   Disorganized; Forgetful; Loses things   Hyperactivity/Impulsivity:   Difficulty waiting turn; Feeling of restlessness   Oppositional/Defiant Behaviors:   Angry   Emotional Irregularity:   Potentially harmful impulsivity   Other Mood/Personality Symptoms:   Pt reports, he was passively suicidal earlier today but not currently.    Mental Status Exam Appearance and self-care  Stature:   Tall   Weight:   Average weight   Clothing:   -- (Pt in scrubs.)   Grooming:   Normal   Cosmetic use:   None   Posture/gait:   Normal   Motor activity:   Not Remarkable   Sensorium  Attention:   Normal   Concentration:  Anxiety interferes   Orientation:  Person; Place   Recall/memory:  Defective in Immediate   Affect and Mood  Affect:  Flat   Mood:  Depressed; Anxious   Relating  Eye contact:   Normal   Facial expression:   Responsive   Attitude toward examiner:   Cooperative   Thought and Language  Speech flow: Normal   Thought content:  Appropriate to Mood and Circumstances   Preoccupation:   None   Hallucinations:   None   Organization:   [place] of Knowledge:   Poor   Intelligence:   Needs investigation   Abstraction:   Concrete   Judgement:   Poor   Reality Testing:   Distorted   Insight:   Lacking    Decision Making:   Impulsive   Social Functioning  Social Maturity:   Impulsive   Social Judgement:   Heedless   Stress  Stressors:   Other (Comment) (Pt reports, "day to day stuff.")   Coping Ability:   Overwhelmed; Deficient supports   Skill Deficits:   Communication; Decision making; Responsibility; Self-control   Supports:   Support needed     Religion: Religion/Spirituality Are You A Religious Person?: Yes What is Your Religious Affiliation?: Catholic How Might This Affect Treatment?: None.  Leisure/Recreation: Leisure / Recreation Do You Have Hobbies?: No  Exercise/Diet: Exercise/Diet Do You Exercise?: No Have You Gained or Lost A Significant Amount of Weight in the Past Six Months?: No  Do You Follow a Special Diet?: No Do You Have Any Trouble Sleeping?: Yes Explanation of Sleeping Difficulties: Pt reports, not getting a lot of sleep.   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 How Long has Patient Been on Disability: Per pt, "a long time." Patient's Job has Been Impacted by Current Illness: No Has Patient ever Been in the [place]?: No  Education: Education Is Patient Currently Attending School?: No Last Grade Completed: 12 Did You Attend College?: No Did You Have An Individualized Education Program (IIEP): No Did You Have Any Difficulty At School?: No Patient's Education Has Been Impacted by Current Illness: No   CCA Family/Childhood History Family and Relationship History: Family history Marital status: Single Does patient have children?: No  Childhood History:  Childhood History By whom was/is the patient raised?: Both parents Did patient suffer any verbal/emotional/physical/sexual abuse as a child?: No Did patient suffer from severe childhood neglect?:  No Has patient ever been sexually abused/assaulted/raped as an adolescent or adult?: No Was the patient ever a victim of a crime or a disaster?: No Witnessed domestic violence?: No Has patient been affected by domestic violence as an adult?: No       CCA Substance Use Alcohol/Drug Use: Alcohol / Drug Use Pain Medications: See MAR Prescriptions: See MAR Over the Counter: See MAR History of alcohol / drug use?: Yes Longest period of sobriety (when/how long): Pt is unsure. Negative Consequences of Use:  (Pt denies substance use.) Withdrawal Symptoms: None    ASAM's:  Six Dimensions of Multidimensional Assessment  Dimension 1:  Acute Intoxication and/or Withdrawal Potential:   Dimension 1:  Description of individual's past and current experiences of substance use and withdrawal: Pt denies substance use.  Dimension 2:  Biomedical Conditions and Complications:   Dimension 2:  Description of patient's biomedical conditions and  complications: Pt denies substance use.  Dimension 3:  Emotional, Behavioral, or Cognitive Conditions and Complications:  Dimension 3:  Description of emotional, behavioral, or cognitive conditions and complications: Pt denies substance use.  Dimension 4:  Readiness to Change:  Dimension 4:  Description of Readiness to Change criteria: Pt denies substance use.  Dimension 5:  Relapse, Continued use, or Continued Problem Potential:  Dimension 5:  Relapse, continued use, or continued problem potential critiera description: Pt denies substance use.  Dimension 6:  Recovery/Living Environment:  Dimension 6:  Recovery/Iiving environment criteria description: Pt denies substance use.  ASAM Severity Score: ASAM's Severity Rating Score: 0  ASAM Recommended Level of Treatment: ASAM Recommended Level of Treatment:  (Pt denies substance use.)   Substance use Disorder (SUD) Substance Use Disorder (SUD)  Checklist Symptoms of Substance Use:  (Pt denies substance  use.)  Recommendations for Services/Supports/Treatments: Recommendations for Services/Supports/Treatments Recommendations For Services/Supports/Treatments: Inpatient Hospitalization  Discharge Disposition: Discharge Disposition Medical Exam completed: Yes  DSM5 Diagnoses: Patient Active Problem List   Diagnosis Date Noted   Ingrown toenail of right foot 07/19/2021   LOM (left otitis media) 01/30/2021   Hyperglycemia 06/28/2020   Head injury due to trauma 03/02/2020   Pedal edema 08/26/2019   Impetigo 02/03/2019   Constipation 12/07/2018   Nasal turbinate hypertrophy 07/27/2016   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   Hyperlipidemia, mild 04/18/2015   Allergic state 02/17/2014   Alcohol abuse, 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 Legal System  What Is the Reason for Your Visit/Call Today? Pt reports, taking one Xanax and three Alprazolam (he takes for Bipolar) because he was scared to go through DT's. Pt reports, his parents held his Xanax until Sunday but he just took them. Pt reports, earlier today he felt  like he wanted to die. Per pt, he feels like people are out to get him and that he's being watched. Pt reports, he has a pocket knife. Pt denies, current SI, HI, self-injurious behaviors. Pt reports, he doesn't feel safe being discharged.  How Long Has This Been Causing You Problems? <Week  What Do You Feel Would Help You the Most Today? Treatment for Depression or other mood problem; Stress Management; Medication(s)   Have You Recently Had Any Thoughts About Hurting Yourself? Yes  Are You Planning to Commit Suicide/Harm Yourself At This time? No   Have you Recently Had Thoughts About [place]? No  Are You Planning to Harm Someone at This Time? No  Explanation: Pt denies, HI.   Have You Used Any Alcohol or Drugs in the Past 24 Hours? No  How Long Ago Did You Use Drugs or Alcohol? Pt denies, substance use. What Did You Use and How Much? Pt denies, substance use.   Do You Currently Have a Therapist/Psychiatrist? Yes  Name of Therapist/Psychiatrist: Pt reports, he's linked to Jesse Jones for medication management and [name] for therapy. Pt reports, he's not sure the name of the agency of his outpatient providers.   Have You Been Recently Discharged From Any Office Practice or Programs? No  Explanation of Discharge From Practice/Program: None.    CCA Screening Triage Referral Assessment Type of Contact: Tele-Assessment  Telemedicine Service Delivery: Telemedicine service delivery: This service was provided via telemedicine using a 2-way, interactive audio and video technology  Is this Initial or Reassessment? Is this Initial or Reassessment?: Initial Assessment  Date Telepsych consult ordered in CHL:  Date Telepsych consult ordered in CHL: 12/06/22  Time Telepsych consult ordered in CHL:  Time Telepsych consult ordered in CHL: 2130  Location of [facility]: [facility]  Provider Location: [facility] Assessment Services    Collateral Involvement: Pt denies, having  supports.   Does Patient Have a Stage manager Guardian? Pt is his own guardian. Name and Contact of Legal Guardian: Pt is his own guardian. If Minor and Not Living with Parent(s), Who has Custody? Pt is his own guardian.  Is CPS involved or ever been involved? Never  Is APS involved or ever been involved? Never   Patient Determined To  Be At Risk for Harm To Self or Others Based on Review of Patient Reported Information or Presenting Complaint? Yes, for Self-Harm  Method: Plan with intent and identified person (Pt took 12 mg of Xanax.)  Availability of Means: Has close by  Intent: Intends to cause physical harm but not necessarily death  Notification Required: No need or identified person  Additional Information for Danger to Others Potential: -- (Pt denies, HI.)  Additional Comments for Danger to Others Potential: Pt denies, HI.  Are There Guns or Other Weapons in [place]? No  Types of Guns/Weapons: Pt denies, access to guns.  Are These Weapons Safely Secured?                            -- (Pt denies, access to guns.)  Who Could Verify You Are Able To Have These Secured: Pt denies, access to guns.  Do You Have any Outstanding Charges, Pending Court Dates, Parole/Probation? Pt denies, legal involvement.  Contacted To Inform of Risk of Harm To Self or Others: -- (Pt denies, SI/HI.)   Does Patient Present under Involuntary Commitment? Yes    [place] of Residence: [place]   Patient Currently Receiving the Following Services: Individual Therapy; Medication Management   Determination of Need: Emergent (2 hours)   Options For Referral: Inpatient Hospitalization; Medication Management; Outpatient Therapy   Discharge Disposition:  Discharge Disposition Medical Exam completed: Yes  Vertell Novak, [place], [place], [facility], [facility] Triage Specialist [phone]

## 2022-12-07 NOTE — ED Notes (Addendum)
Pt to be admitted to [facility] at [facility] 2/10

## 2022-12-08 ENCOUNTER — Inpatient Hospital Stay (HOSPITAL_COMMUNITY)
Admission: AD | Admit: 2022-12-08 | Discharge: 2022-12-20 | DRG: 885 | Disposition: A | Discharge status: Home / Self Care | Payer: Medicare Other | Source: Intra-hospital | Attending: Psychiatry | Admitting: Psychiatry

## 2022-12-08 ENCOUNTER — Other Ambulatory Visit: Payer: Self-pay

## 2022-12-08 ENCOUNTER — Encounter (HOSPITAL_COMMUNITY): Payer: Self-pay | Admitting: Psychiatry

## 2022-12-08 DIAGNOSIS — F3160 Bipolar disorder, current episode mixed, unspecified: Secondary | ICD-10-CM | POA: Diagnosis present

## 2022-12-08 DIAGNOSIS — Z87891 Personal history of nicotine dependence: Secondary | ICD-10-CM | POA: Diagnosis not present

## 2022-12-08 DIAGNOSIS — F909 Attention-deficit hyperactivity disorder, unspecified type: Secondary | ICD-10-CM | POA: Diagnosis present

## 2022-12-08 DIAGNOSIS — T424X2A Poisoning by benzodiazepines, intentional self-harm, initial encounter: Secondary | ICD-10-CM | POA: Diagnosis present

## 2022-12-08 DIAGNOSIS — Z79899 Other long term (current) drug therapy: Secondary | ICD-10-CM | POA: Diagnosis not present

## 2022-12-08 DIAGNOSIS — F1011 Alcohol abuse, in remission: Secondary | ICD-10-CM | POA: Diagnosis present

## 2022-12-08 DIAGNOSIS — F314 Bipolar disorder, current episode depressed, severe, without psychotic features: Principal | ICD-10-CM | POA: Diagnosis present

## 2022-12-08 DIAGNOSIS — F13239 Sedative, hypnotic or anxiolytic dependence with withdrawal, unspecified: Secondary | ICD-10-CM | POA: Diagnosis present

## 2022-12-08 DIAGNOSIS — J309 Allergic rhinitis, unspecified: Secondary | ICD-10-CM | POA: Diagnosis present

## 2022-12-08 DIAGNOSIS — F411 Generalized anxiety disorder: Secondary | ICD-10-CM | POA: Diagnosis present

## 2022-12-08 DIAGNOSIS — Z20822 Contact with and (suspected) exposure to covid-19: Secondary | ICD-10-CM | POA: Diagnosis present

## 2022-12-08 DIAGNOSIS — F3130 Bipolar disorder, current episode depressed, mild or moderate severity, unspecified: Secondary | ICD-10-CM | POA: Diagnosis present

## 2022-12-08 DIAGNOSIS — Z818 Family history of other mental and behavioral disorders: Secondary | ICD-10-CM

## 2022-12-08 DIAGNOSIS — G4700 Insomnia, unspecified: Secondary | ICD-10-CM | POA: Diagnosis present

## 2022-12-08 DIAGNOSIS — F1320 Sedative, hypnotic or anxiolytic dependence, uncomplicated: Secondary | ICD-10-CM | POA: Diagnosis present

## 2022-12-08 MED ORDER — LORAZEPAM 1 MG PO TABS: 1.0000 mg | ORAL_TABLET | Freq: Once | ORAL | Status: AC

## 2022-12-08 MED ORDER — ZIPRASIDONE MESYLATE 20 MG IM SOLR
20.0000 mg | INTRAMUSCULAR | Status: AC | PRN
  Administered 2022-12-13: 20 mg via INTRAMUSCULAR
  Filled 2022-12-08: qty 20

## 2022-12-08 MED ORDER — ESCITALOPRAM OXALATE 20 MG PO TABS
20.0000 mg | ORAL_TABLET | Freq: Every day | ORAL | Status: DC
  Filled 2022-12-08 (×2): qty 1

## 2022-12-08 MED ORDER — ADULT MULTIVITAMIN W/MINERALS CH
1.0000 | ORAL_TABLET | Freq: Every morning | ORAL | Status: DC
  Administered 2022-12-08 – 2022-12-20 (×12): 1 via ORAL
  Filled 2022-12-08 (×16): qty 1

## 2022-12-08 MED ORDER — LORAZEPAM 2 MG/ML IJ SOLN
INTRAMUSCULAR | Status: AC
  Filled 2022-12-08: qty 1

## 2022-12-08 MED ORDER — BUSPIRONE HCL 5 MG PO TABS
5.0000 mg | ORAL_TABLET | Freq: Two times a day (BID) | ORAL | Status: DC | PRN
  Administered 2022-12-08: 5 mg via ORAL
  Filled 2022-12-08: qty 1

## 2022-12-08 MED ORDER — HYDROXYZINE HCL 25 MG PO TABS
25.0000 mg | ORAL_TABLET | Freq: Four times a day (QID) | ORAL | Status: AC | PRN
  Administered 2022-12-09 – 2022-12-10 (×2): 25 mg via ORAL
  Filled 2022-12-08 (×2): qty 1

## 2022-12-08 MED ORDER — ALUM & MAG HYDROXIDE-SIMETH 200-200-20 MG/5ML PO SUSP
30.0000 mL | ORAL | Status: DC | PRN
  Administered 2022-12-13: 30 mL via ORAL
  Filled 2022-12-08: qty 30

## 2022-12-08 MED ORDER — LORAZEPAM 1 MG PO TABS
1.0000 mg | ORAL_TABLET | Freq: Three times a day (TID) | ORAL | Status: DC
  Administered 2022-12-09 (×2): 1 mg via ORAL
  Filled 2022-12-08 (×2): qty 1

## 2022-12-08 MED ORDER — LORAZEPAM 1 MG PO TABS
1.0000 mg | ORAL_TABLET | Freq: Once | ORAL | Status: AC
  Administered 2022-12-08: 1 mg via ORAL
  Filled 2022-12-08: qty 1

## 2022-12-08 MED ORDER — THIAMINE HCL 100 MG/ML IJ SOLN
100.0000 mg | Freq: Once | INTRAMUSCULAR | Status: AC
  Administered 2022-12-08: 100 mg via INTRAMUSCULAR
  Filled 2022-12-08: qty 2

## 2022-12-08 MED ORDER — LORAZEPAM 1 MG PO TABS: 2.0000 mg | ORAL_TABLET | Freq: Three times a day (TID) | ORAL | Status: DC

## 2022-12-08 MED ORDER — FLUTICASONE PROPIONATE 50 MCG/ACT NA SUSP: 2.0000 | Freq: Every day | NASAL | Status: DC | PRN

## 2022-12-08 MED ORDER — OLANZAPINE 5 MG PO TABS
15.0000 mg | ORAL_TABLET | Freq: Every day | ORAL | Status: DC
  Administered 2022-12-08 – 2022-12-13 (×6): 15 mg via ORAL
  Filled 2022-12-08 (×10): qty 3

## 2022-12-08 MED ORDER — MAGNESIUM HYDROXIDE 400 MG/5ML PO SUSP: 30.0000 mL | Freq: Every day | ORAL | Status: DC | PRN

## 2022-12-08 MED ORDER — ONDANSETRON 4 MG PO TBDP: 4.0000 mg | ORAL_TABLET | Freq: Four times a day (QID) | ORAL | Status: AC | PRN

## 2022-12-08 MED ORDER — OMEGA-3-ACID ETHYL ESTERS 1 G PO CAPS
2.0000 g | ORAL_CAPSULE | Freq: Every day | ORAL | Status: DC
  Administered 2022-12-08 – 2022-12-20 (×13): 2 g via ORAL
  Filled 2022-12-08 (×15): qty 2

## 2022-12-08 MED ORDER — VITAMIN B-1 100 MG PO TABS
100.0000 mg | ORAL_TABLET | Freq: Every day | ORAL | Status: DC
  Administered 2022-12-09 – 2022-12-20 (×12): 100 mg via ORAL
  Filled 2022-12-08 (×14): qty 1

## 2022-12-08 MED ORDER — ACETAMINOPHEN 325 MG PO TABS
650.0000 mg | ORAL_TABLET | Freq: Four times a day (QID) | ORAL | Status: DC | PRN
  Administered 2022-12-09 – 2022-12-12 (×2): 650 mg via ORAL
  Filled 2022-12-08 (×2): qty 2

## 2022-12-08 MED ORDER — LOPERAMIDE HCL 2 MG PO CAPS: 2.0000 mg | ORAL_CAPSULE | ORAL | Status: AC | PRN

## 2022-12-08 MED ORDER — LORAZEPAM 2 MG/ML IJ SOLN
1.0000 mg | Freq: Once | INTRAMUSCULAR | Status: AC
  Administered 2022-12-08: 1 mg via INTRAMUSCULAR

## 2022-12-08 MED ORDER — LORAZEPAM 1 MG PO TABS: 1.0000 mg | ORAL_TABLET | Freq: Three times a day (TID) | ORAL | Status: DC

## 2022-12-08 MED ORDER — ADULT MULTIVITAMIN W/MINERALS CH
1.0000 | ORAL_TABLET | Freq: Every day | ORAL | Status: DC
  Administered 2022-12-08 – 2022-12-09 (×2): 1 via ORAL
  Filled 2022-12-08 (×5): qty 1

## 2022-12-08 MED ORDER — ESCITALOPRAM OXALATE 20 MG PO TABS
20.0000 mg | ORAL_TABLET | Freq: Every day | ORAL | Status: DC
  Administered 2022-12-09 – 2022-12-20 (×12): 20 mg via ORAL
  Filled 2022-12-08 (×14): qty 1

## 2022-12-08 MED ORDER — OLANZAPINE 5 MG PO TBDP
5.0000 mg | ORAL_TABLET | Freq: Three times a day (TID) | ORAL | Status: DC | PRN
  Administered 2022-12-13 – 2022-12-20 (×6): 5 mg via ORAL
  Filled 2022-12-08 (×6): qty 1

## 2022-12-08 MED ORDER — AMPHETAMINE-DEXTROAMPHET ER 10 MG PO CP24
30.0000 mg | ORAL_CAPSULE | Freq: Every morning | ORAL | Status: DC
  Administered 2022-12-09 – 2022-12-10 (×2): 30 mg via ORAL
  Filled 2022-12-08 (×2): qty 3

## 2022-12-08 MED ORDER — TRAZODONE HCL 50 MG PO TABS
50.0000 mg | ORAL_TABLET | Freq: Every evening | ORAL | Status: DC | PRN
  Administered 2022-12-08 – 2022-12-13 (×5): 50 mg via ORAL
  Filled 2022-12-08 (×5): qty 1

## 2022-12-08 NOTE — Group Note (Signed)
LCSW Group Therapy Note   Group Date: 12/08/2022 Start Time: 1400 End Time: 1415   Topic: Cognitive Distortions  Due to limited staffing,  group was not held. Patient was provided therapeutic worksheets and asked to meet with CSW as needed.    Vassie Moselle, LCSW 12/08/2022  2:25 PM

## 2022-12-08 NOTE — ED Provider Notes (Signed)
Emergency Medicine Observation Re-evaluation Note  Jesse Jones is a 42 y.o. male, seen on rounds today at 0700.  Pt initially presented to the ED for complaints of Ingestion Currently, the patient is resting comfortably.  Physical Exam  BP (!) 136/98 (BP Location: Left Arm)   Pulse 99   Temp 98.1 F (36.7 C) (Oral)   Resp 18   Ht 6' 2"$  (1.88 m)   Wt 89 kg   SpO2 96%   BMI 25.19 kg/m  Physical Exam General: NAD   ED Course / MDM  EKG:EKG Interpretation  Date/Time:  Thursday December 06 2022 21:10:52 EST Ventricular Rate:  90 PR Interval:  132 QRS Duration: 104 QT Interval:  379 QTC Calculation: 464 R Axis:   84 Text Interpretation: Sinus rhythm Confirmed by Tretha Sciara [phone]) on 12/06/2022 10:30:49 PM  I have reviewed the labs performed to date as well as medications administered while in observation.  Recent changes in the last 24 hours include no acute events reported.  Plan  Current plan is for admission to [facility] after 9AM today. Dr. Caswell Corwin accepting.    Valarie Merino, MD 12/08/22 [phone]

## 2022-12-08 NOTE — ED Notes (Signed)
Transport called for transportation to [facility].

## 2022-12-08 NOTE — BHH Group Notes (Signed)
Goals Group 12/08/22   Group Focus: affirmation, clarity of thought, and goals/reality orientation Treatment Modality:  Psychoeducation Interventions utilized were assignment, group exercise, and support Purpose: To be able to understand and verbalize the reason for their admission to the hospital. To understand that the medication helps with their chemical imbalance but they also need to work on their choices in life. To be challenged to develop a list of 30 positives about themselves. Also introduce the concept that "feelings" are not reality.  Participation Level:  did not attend  Jesse Jones

## 2022-12-08 NOTE — BHH Group Notes (Signed)
.  Psychoeducational Group Note    Date:  12/08/2022 Time:1300-1400    Purpose of Group: . The group focus' on teaching patients on how to identify their needs and their Life Skills:  A group where two lists are made. What people need and what are things that we do that are unhealthy. The lists are developed by the patients and it is explained that we often do the actions that are not healthy to get our list of needs met.  Goal:: to develop the coping skills needed to get their needs met  Participation Level:  Active  Participation Quality:  Appropriate  Affect:  Appropriate  Cognitive:  Oriented  Insight: Improving  Engagement in Group:  Engaged  Modes of Intervention:  Activity, Discussion, Education, and Support  Additional Comments:  Rates his energy at a 3.5/10. Participated in the group.  Jesse Jones

## 2022-12-08 NOTE — Progress Notes (Addendum)
Pt admitted to unit today from [facility] following an intentional overdose of xanax as a suicide attempt. Pt states he has been depressed and thinking of ending his life. Pt states he sleeps 16-20 hrs per day. Pt states his appetite has been fluctuating. Pt endorses anxiety, noting at times he feels dizzy, tremulous, and believes he may have a seizure. Pt states he has never actually had a seizure. 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 Pt gives a history of alcohol use, noting he has been sober for more than 4 yrs. Pt lives alone but states his parents are supportive of him. Pt does contract for safety at this time. Q 15 minute checks initiated.

## 2022-12-08 NOTE — Progress Notes (Signed)
Pt complaining of anxiety this afternoon. Buspar ineffective. 520 pm pt reports anxiety 8/10, reports feeling tremulous and dizzy. Pt escorted to sit in enclave with nurse, vital assessed and noted to be elevated. Pt layed self on floor. Fidgety and restless. Pt states he has been prescribed benzodiazepines since a teenager. Provider notified, new order noted. Pt received ativan IM. VS reassessed and trending down. 550pm pt symptoms decreasing. Pt escorted to room. Pt eating dinner. Fluids encouraged. UDS pending. Q 15 minute checks ongoing.

## 2022-12-08 NOTE — Plan of Care (Signed)
  Problem: Safety: Goal: Periods of time without injury will increase Outcome: Progressing   

## 2022-12-08 NOTE — Progress Notes (Signed)
   12/08/22 2000  Psych Admission Type (Psych Patients Only)  Admission Status Involuntary  Psychosocial Assessment  Patient Complaints Depression  Eye Contact Fair  Facial Expression Anxious  Affect Depressed  Speech Logical/coherent  Interaction Assertive  Motor Activity Other (Comment)  Appearance/Hygiene Unremarkable  Behavior Characteristics Cooperative  Mood Depressed  Thought Process  Coherency WDL  Content WDL  Delusions None reported or observed  Perception WDL  Hallucination Auditory  Judgment Impaired  Confusion None  Danger to Self  Current suicidal ideation? Denies  Danger to Others  Danger to Others None reported or observed   Alert/oriented. Makes needs/concerns known to staff. Pleasant cooperative with staff. Denies SI/HI/V hallucinations but reports Audio hallucinations.   Will encourage continue compliance and progression towards goals. Verbally contracted for safety. Will continue to monitor.

## 2022-12-08 NOTE — Progress Notes (Cosign Needed Addendum)
Provider contacted after patient noted to report dizziness and c/o feeling tremulous. Patient reported history of prescribed Benzodiazepines since teenager most recent dose Alprazolam 3 mg XR; unable to state last dose. PDMP confirmed last prescription filled 11/23/22 30 pills with consistent monthly dosing noted. No EKG, UDS on file at this time.   Patient assessed in his room sitting up on the bed where he reports returning from gym after playing basketball and 'feeling terrible'. Left hand amputated. Visible perspiration. He reports 8/10 anxiety. Mydriasis noted bilateral pupil; reactive (constrict) to light. Moderate tremor noted. Patient reports overall feeling poorly; some dizziness. He denies any chest pain or shortness of breath. States he had no symptoms prior to going out to play basketball. Per chart review, pt received x1 prn dose of Lorazepam 12/08/22 0212. Nursing at the bedside providing fluids.   Discussed care with attending MD Winfred Leeds. Patient placed on Lorazepam detox protocol with Lorazepam dose 1 mg TID for 48 hours. Patient received one time dose of 1 mg IM 1747 for initial symptoms (elevated bp/pr, tremor, diaphoresis, dizziness); bp, pulse decreased. [place]: Nursing report pt is less tremulous and currently at bedside eating dinner since administering. Patient will remain on CIWA protocol overnight with vital sign monitoring.   1848: patient observed in dayroom visiting with family. Denies any symptoms at this time.   PLAN:  Labs: CMP, CBC, Lipid panel, UDS, EKG  Medications:  Start:   - Lorazepam Detox Protocol in place   - Agitation Protocol: Olanzapine 5  mg,  Geodon 20 mg IM - Lovaza 2 mg daily - multivitamin tablet daily  PRN:   - Trazodone 50 mg PRN HS sleep  - Buspar 5 mg BID PRN anxiety  - Acetaminophen 650 mg q6hrs  PRN mild pain  - Maalox/Mylanta 30 mL q4hrs PRN  indigestion  - Flonase 50 mcg/ACT 2 spray daily  PRN allergies - Milk of Magnesia 30 mL daily  PRN mild constipation

## 2022-12-08 NOTE — Progress Notes (Signed)
Adult Psychoeducational Group Note  Date:  12/08/2022 Time:  9:58 PM  Group Topic/Focus:  Wrap-Up Group:   The focus of this group is to help patients review their daily goal of treatment and discuss progress on daily workbooks.  Participation Level:  Active  Participation Quality:  Appropriate  Affect:  Appropriate  Cognitive:  Appropriate  Insight: Appropriate  Engagement in Group:  Engaged  Modes of Intervention:  Discussion  Additional Comments:  [place] attend wrap up group  Lenice Llamas Long 12/08/2022, 9:58 PM

## 2022-12-09 LAB — CBC WITH DIFFERENTIAL/PLATELET
Abs Immature Granulocytes: 0.02 10*3/uL (ref 0.00–0.07)
Basophils Absolute: 0.1 10*3/uL (ref 0.0–0.1)
Basophils Relative: 1 %
Eosinophils Absolute: 0.4 10*3/uL (ref 0.0–0.5)
Eosinophils Relative: 4 %
HCT: 43.8 % (ref 39.0–52.0)
Hemoglobin: 14.9 g/dL (ref 13.0–17.0)
Immature Granulocytes: 0 %
Lymphocytes Relative: 33 %
Lymphs Abs: 2.7 10*3/uL (ref 0.7–4.0)
MCH: 28.7 pg (ref 26.0–34.0)
MCHC: 34 g/dL (ref 30.0–36.0)
MCV: 84.2 fL (ref 80.0–100.0)
Monocytes Absolute: 0.8 10*3/uL (ref 0.1–1.0)
Monocytes Relative: 10 %
Neutro Abs: 4.2 10*3/uL (ref 1.7–7.7)
Neutrophils Relative %: 52 %
Platelets: 304 10*3/uL (ref 150–400)
RBC: 5.2 MIL/uL (ref 4.22–5.81)
RDW: 13.1 % (ref 11.5–15.5)
WBC: 8 10*3/uL (ref 4.0–10.5)
nRBC: 0 % (ref 0.0–0.2)

## 2022-12-09 LAB — COMPREHENSIVE METABOLIC PANEL
ALT: 13 U/L (ref 0–44)
AST: 20 U/L (ref 15–41)
Albumin: 4.9 g/dL (ref 3.5–5.0)
Alkaline Phosphatase: 55 U/L (ref 38–126)
Anion gap: 12 (ref 5–15)
BUN: <5 mg/dL — ABNORMAL LOW (ref 6–20)
CO2: 26 mmol/L (ref 22–32)
Calcium: 9.8 mg/dL (ref 8.9–10.3)
Chloride: 99 mmol/L (ref 98–111)
Creatinine, Ser: 0.82 mg/dL (ref 0.61–1.24)
GFR, Estimated: >60 mL/min (ref >60)
Glucose, Bld: 110 mg/dL — ABNORMAL HIGH (ref 70–99)
Potassium: 3.7 mmol/L (ref 3.5–5.1)
Sodium: 137 mmol/L (ref 135–145)
Total Bilirubin: 0.9 mg/dL (ref 0.3–1.2)
Total Protein: 8.2 g/dL — ABNORMAL HIGH (ref 6.5–8.1)

## 2022-12-09 LAB — LIPID PANEL
Cholesterol: 191 mg/dL (ref 0–200)
HDL: 60 mg/dL (ref >40)
LDL Cholesterol: 123 mg/dL — ABNORMAL HIGH (ref 0–99)
Total CHOL/HDL Ratio: 3.2 RATIO
Triglycerides: 42 mg/dL (ref <150)
VLDL: 8 mg/dL (ref 0–40)

## 2022-12-09 LAB — RAPID URINE DRUG SCREEN, HOSP PERFORMED
Amphetamines: POSITIVE — AB
Barbiturates: NOT DETECTED
Benzodiazepines: POSITIVE — AB
Cocaine: NOT DETECTED
Opiates: NOT DETECTED
Tetrahydrocannabinol: NOT DETECTED

## 2022-12-09 LAB — HEMOGLOBIN A1C
Hgb A1c MFr Bld: 4.6 % — ABNORMAL LOW (ref 4.8–5.6)
Mean Plasma Glucose: 85.32 mg/dL

## 2022-12-09 LAB — TSH: TSH: 1.766 u[IU]/mL (ref 0.350–4.500)

## 2022-12-09 MED ORDER — LORAZEPAM 1 MG PO TABS
1.0000 mg | ORAL_TABLET | Freq: Three times a day (TID) | ORAL | Status: AC
  Administered 2022-12-09 – 2022-12-10 (×3): 1 mg via ORAL
  Filled 2022-12-09 (×3): qty 1

## 2022-12-09 MED ORDER — LORAZEPAM 1 MG PO TABS
1.0000 mg | ORAL_TABLET | Freq: Two times a day (BID) | ORAL | Status: AC
  Administered 2022-12-10 – 2022-12-11 (×2): 1 mg via ORAL
  Filled 2022-12-09 (×2): qty 1

## 2022-12-09 MED ORDER — LORAZEPAM 0.5 MG PO TABS
0.5000 mg | ORAL_TABLET | Freq: Two times a day (BID) | ORAL | Status: AC
  Administered 2022-12-11 – 2022-12-12 (×2): 0.5 mg via ORAL
  Filled 2022-12-09 (×2): qty 1

## 2022-12-09 NOTE — Progress Notes (Signed)
D:  Patient's self inventory sheet, patient has fair sleep, sleep medication helpful.  Poor appetite, high energy level, poor concentration.  Rated depression 7, hopeless 8, anxiety 9-5-7.  Withdrawals, tremors, diarrhea, chilling, cravings, cramping, irritability.  Denied SI.  Physical problems, lightheaded, pain, dizzy, headaches, blurred vision.  Physical pain, worst pain #3 in past 24 hours.  Goal is be positive, think about non-anxiety things.  No discharge plans. A:  Medications administered per MD orders.  Emotional support and encouragement given patient. R:  Denied HI, contracts for safety.  Patient stated he does see double vision at times, or one face on top of another face.  SI at times, no plan to hurt self, contracts for safety.    Slept 5 hours off/on.  Stated he thinks in his head, talks to himself, calls himself "[place]".

## 2022-12-09 NOTE — [facility] Suicide Risk Assessment (Signed)
Suicide Risk Assessment  Admission Assessment    [facility] Admission Suicide Risk Assessment   Nursing information obtained from:  Patient Demographic factors:  Male Current Mental Status:  Suicidal ideation indicated by patient Loss Factors:  NA Historical Factors:  NA Risk Reduction Factors:  Sense of responsibility to family  Total Time spent with patient: 45 minutes Principal Problem: Bipolar disorder, mixed ([place]) Diagnosis:  Principal Problem:   Bipolar disorder, mixed ([place]) Active Problems:   Bipolar affect, depressed ([place])  Subjective Data:  History of Present Illness: Jesse Jones is a 42 year old male with past psychiatric history of ADHD, bipolar affect, anxiety, bipolar disorder, mixed, head trama, intentional overdose who presented to [facility] 12/06/22 for intentional overdose of prescription Xanax where he expressed to ED physician 'he would plan to do it again'. He was evaluated and placed on IVC. Per chart review, patient reports that he 'got fed up' with having to take medicine, he says his parents frustrated him about his medication and they got into an argument and so "I took it all."  When provider asked him what all meant he said he took 3 olanzapine, and 1 Xanax at this time he is unsure how many milligrams of each medications were.  Patient does not know why he did it, and understands how dangerous it could have been.  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 He denies any illicit drug or alcohol use; identifies as a recovering alcoholic and has not had a drink in 8 years    24 hour assessment: Provider contacted after patient noted to report dizziness and c/o feeling tremulous. Patient reported history of prescribed Benzodiazepines since  teenager most recent dose Alprazolam 3 mg XR; unable to state last dose. PDMP confirmed last prescription filled 11/23/22 30 pills with consistent monthly dosing noted. No EKG, UDS on file at the time; ordered by provider. Patient was given Lorazepam 1 mg IM for withdrawal symptoms and placed on CIWA precautions with scheduled Lorazepam 1 mg TID. Symptoms improved overnight; CIWA scores have remained 0-1. UDS sent to lab +amphetamines, benzodiazepines; PDMP reviewed shows active prescription history of Xanax 3 mg, Vyvanse. Staff report patient has remained visible in milieu participating in unit activities; deny any obvious delusions or psychotic behavior.    Assessment: Patient observed in dayroom through out the day participating in group and interacting with peers/staff. Reports feeling 'shameful because it was stupid and I shouldn't have done it' regarding his attempt. He endorses improved outlook on life and realizes his need for treatment. He is endorsing ongoing poor appetite but was able to eat breakfast, 'not as much for lunch'. He describes his symptoms that led to the attempt as, 'depression. No hope. I was having suicidal thoughts. Hopelessness. Just a long time of being depressed kind of triggered it'.    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 Required rehab and reports being sober x4  years. Denies any illicit substance use or misuse of current prescriptions.    Patient reports he currently lives alone. Receives disability. Identifies parents and close friend Thurmond Butts as support systems. Mom visited last night where he describes visit as 'short but good' describes talking about getting the help  he needs.    He reports feeling significant improvement from yesterday regarding withdrawal symptoms. Feels more like himself. He denies any chest pain or shortness of breath. Reports having intermittent headaches and minimum anxiety. No diaphoresis, tremor, stomach cramps, seizures, nausea, vomiting, or diarrhea noted. Describes his mood as 'in good spirits'. He rates his depression 7/10 and anxiety 3/10 with 10 being the worst. He denies any thoughts of wanting to harm himself or anyone. He describes possible auditory hallucinations saying they are 'thoughts outloud' that he talks to when alone; endorses possible visual hallucinations that are blurry and distorted faces that worry him but don't induce fear. Reports some thoughts of paranoia that he states is 'normal' when his anxiety comes on, 'but then it passes'. Thought content appear linear and logical. No delusional thought content noted. Currently contracts for safety.   Continued Clinical Symptoms:  Alcohol Use Disorder Identification Test Final Score (AUDIT): 0 The "Alcohol Use Disorders Identification Test", Guidelines for Use in Primary Care, Second Edition.  World Pharmacologist [facility]). Score between 0-7:  no or low risk or alcohol related problems. Score between 8-15:  moderate risk of alcohol related problems. Score between 16-19:  high risk of alcohol related problems. Score 20 or above:  warrants further diagnostic evaluation for alcohol dependence and treatment.   CLINICAL FACTORS:   Bipolar Disorder:   Depressive phase Alcohol/Substance Abuse/Dependencies More than one psychiatric diagnosis   Musculoskeletal: Strength & Muscle Tone: within normal limits Gait & Station: normal Patient leans: N/A  Psychiatric Specialty Exam:  Presentation  General Appearance:  Casual  Eye Contact: Good  Speech: Clear and Coherent  Speech Volume: Normal  Handedness: Right   Mood and Affect   Mood: Euthymic  Affect: Flat   Thought Process  Thought Processes: Coherent  Descriptions of Associations:Intact  Orientation:Full (Time, Place and Person)  Thought Content:Logical  History of Schizophrenia/Schizoaffective disorder:No  Duration of Psychotic Symptoms:No data recorded Hallucinations:Hallucinations: None  Ideas of Reference:None  Suicidal Thoughts:Suicidal Thoughts: No  Homicidal Thoughts:Homicidal Thoughts: No   Sensorium  Memory: Immediate Good; Recent Good  Judgment: Fair  Insight: Fair   Community education officer  Concentration: Good  Attention Span: Good  Recall: Good  Fund of Knowledge: Good  Language: Good   Psychomotor Activity  Psychomotor Activity: Psychomotor Activity: Normal   Assets  Assets: Communication Skills; Housing; Physical Health; Resilience; Social Support   Sleep  Sleep: Sleep: Fair    Physical Exam: Physical Exam Vitals and nursing note reviewed.  Constitutional:      Appearance: He is normal weight.  HENT:     Head: Normocephalic.     Nose: Nose normal.     Mouth/Throat:     Mouth: Mucous membranes are moist.     Pharynx: Oropharynx is clear.  Eyes:     Pupils: Pupils are equal, round, and reactive to light.  Cardiovascular:     Rate and Rhythm: Normal rate.     Pulses: Normal pulses.  Pulmonary:     Effort: Pulmonary effort is normal.  Abdominal:     Palpations: Abdomen is soft.  Musculoskeletal:     Cervical back: Normal range of motion.     Comments: Left hand deformity  Skin:    General: Skin is dry.  Neurological:     Mental Status: He is alert and oriented to person, place, and time.  Psychiatric:        Attention and Perception: He perceives visual hallucinations.        Speech: Speech normal.        Behavior: Behavior is cooperative.        Thought Content: Thought content does not include homicidal or suicidal ideation. Thought content does not include homicidal or  suicidal plan.        Cognition and Memory: Cognition is impaired. He exhibits impaired recent memory.        Judgment: Judgment is impulsive.    Review of Systems  Psychiatric/Behavioral:  Positive for hallucinations and substance abuse.   All other systems reviewed and are negative.  Blood pressure 127/89, pulse 89, temperature 98.1 F (36.7 C), temperature source Oral, resp. rate 16, height 6' 2"$  (1.88 m), weight 86.2 kg, SpO2 98 %. Body mass index is 24.39 kg/m.   COGNITIVE FEATURES THAT CONTRIBUTE TO RISK:  Loss of executive function    SUICIDE RISK:   Severe:  Frequent, intense, and enduring suicidal ideation, specific plan, no subjective intent, but some objective markers of intent (i.e., choice of lethal method), the method is accessible, some limited preparatory behavior, evidence of impaired self-control, severe dysphoria/symptomatology, multiple risk factors present, and few if any protective factors, particularly a lack of social support.  PLAN OF CARE:  Treatment Plan Summary: Daily contact with patient to assess and evaluate symptoms and progress in treatment, Medication management, and Plan   Medications:  Continue:              - Lorazepam Detox Protocol in place               - Agitation Protocol: Olanzapine 5  mg,  Geodon 20 mg IM - Lovaza 2 mg daily - multivitamin tablet daily   PRN:              - Trazodone 50 mg PRN HS sleep             - Buspar 5 mg BID PRN anxiety             - Acetaminophen 650 mg q6hrs  PRN mild pain             - Maalox/Mylanta 30 mL q4hrs PRN  indigestion             - Flonase 50 mcg/ACT 2 spray daily  PRN allergies - Milk of Magnesia 30 mL daily PRN mild constipation     Observation Level/Precautions:  15 minute checks  Laboratory:  CBC Chemistry Profile HbAIC UDS UA, EKG  Psychotherapy:  group, milieu  Medications:  see MAR  Consultations:   Social Work  Discharge Concerns:  safety  Estimated LOS: 7-10 days  Other:       Physician Treatment Plan for Primary Diagnosis: Bipolar disorder, mixed ([place]) Long Term Goal(s): Improvement in symptoms so as ready for discharge   Short Term Goals: Ability to identify changes in lifestyle to reduce recurrence of condition will improve, Ability to verbalize feelings will improve, Ability to disclose and discuss suicidal ideas, Ability to demonstrate self-control will improve, Ability to identify and develop effective coping behaviors will improve, Ability to maintain clinical measurements within normal limits will improve, Compliance with prescribed medications will improve, and Ability to identify triggers associated with substance abuse/mental health issues  will improve   Physician Treatment Plan for Secondary Diagnosis: Principal Problem:   Bipolar disorder, mixed ([place]) Active Problems:   Bipolar affect, depressed ([place])   Long Term Goal(s): Improvement in symptoms so as ready for discharge   Short Term Goals: Ability to identify changes in lifestyle to reduce recurrence of condition will improve, Ability to verbalize feelings will improve, Ability to disclose and discuss suicidal ideas, Ability to demonstrate self-control will improve, Ability to identify and develop effective coping behaviors will improve, Ability to maintain clinical measurements within normal limits will improve, Compliance with prescribed medications will improve, and Ability to identify triggers associated with substance abuse/mental health issues will improve  I certify that inpatient services furnished can reasonably be expected to improve the patient's condition.   Inda Merlin, NP 12/09/2022, 3:40 PM

## 2022-12-09 NOTE — Plan of Care (Signed)
Nurse discussed coping skills with patient.  

## 2022-12-09 NOTE — H&P (Cosign Needed Addendum)
Psychiatric Admission Assessment Adult  Patient Identification: Jesse Jones MRN:  [id number] Date of Evaluation:  12/09/2022 Chief Complaint:  Bipolar affect, depressed ([place]) [F31.30] Principal Diagnosis: Bipolar disorder, mixed ([place]) Diagnosis:  Principal Problem:   Bipolar disorder, mixed ([place]) Active Problems:   Bipolar affect, depressed ([place])  History of Present Illness: Jesse Jones is a 42 year old male with past psychiatric history of ADHD, bipolar affect, anxiety, bipolar disorder, mixed, head trama, intentional overdose who presented to [facility] 12/06/22 for intentional overdose of prescription Xanax where he expressed to ED physician 'he would plan to do it again'. He was evaluated and placed on IVC. Per chart review, patient reports that he 'got fed up' with having to take medicine, he says his parents frustrated him about his medication and they got into an argument and so "I took it all."  When provider asked him what all meant he said he took 3 olanzapine, and 1 Xanax at this time he is unsure how many milligrams of each medications were.  Patient does not know why he did it, and understands how dangerous it could have been.  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 He denies any illicit drug or alcohol use; identifies as a recovering alcoholic and has not had a drink in 8 years   24 hour assessment: Provider contacted after patient noted to report dizziness and c/o feeling tremulous. Patient reported history of prescribed Benzodiazepines since teenager most recent dose Alprazolam 3 mg XR; unable to state last dose. PDMP confirmed last prescription filled 11/23/22 30 pills with consistent monthly dosing noted. No EKG, UDS on file at the time; ordered  by provider. Patient was given Lorazepam 1 mg IM for withdrawal symptoms and placed on CIWA precautions with scheduled Lorazepam 1 mg TID. Symptoms improved overnight; CIWA scores have remained 0-1. UDS sent to lab +amphetamines, benzodiazepines; PDMP reviewed shows active prescription history of Xanax 3 mg, Vyvanse. Staff report patient has remained visible in milieu participating in unit activities; deny any obvious delusions or psychotic behavior.    Assessment: Patient observed in dayroom through out the day participating in group and interacting with peers/staff. Reports feeling 'shameful because it was stupid and I shouldn't have done it' regarding his attempt. He endorses improved outlook on life and realizes his need for treatment. He is endorsing ongoing poor appetite but was able to eat breakfast, 'not as much for lunch'. He describes his symptoms that led to the attempt as, 'depression. No hope. I was having suicidal thoughts. Hopelessness. Just a long time of being depressed kind of triggered it'.   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 Required rehab and reports being sober x4 years. Denies any illicit substance use or misuse of current prescriptions.   Patient reports he currently lives alone. Receives disability. Identifies parents and close friend Jesse Jones as support systems. Mom visited last night where he describes visit as '  short but good' describes talking about getting the help he needs.    He reports feeling significant improvement from yesterday regarding withdrawal symptoms. Feels more like himself. He denies any chest pain or shortness of breath. Reports having intermittent  headaches and minimum anxiety. No diaphoresis, tremor, stomach cramps, seizures, nausea, vomiting, or diarrhea noted. Describes his mood as 'in good spirits'. He rates his depression 7/10 and anxiety 3/10 with 10 being the worst. He denies any thoughts of wanting to harm himself or anyone. He describes possible auditory hallucinations saying they are 'thoughts outloud' that he talks to when alone; endorses possible visual hallucinations that are blurry and distorted faces that worry him but don't induce fear. Reports some thoughts of paranoia that he states is 'normal' when his anxiety comes on, 'but then it passes'. Thought content appear linear and logical. No delusional thought content noted. Currently contracts for safety.    Associated Signs/Symptoms: Depression Symptoms:  depressed mood, anhedonia, insomnia, fatigue, feelings of worthlessness/guilt, difficulty concentrating, hopelessness, impaired memory, recurrent thoughts of death, suicidal attempt, anxiety, panic attacks, decreased appetite, (Hypo) Manic Symptoms:  Delusions, Elevated Mood, Flight of Ideas, Impulsivity, Irritable Mood, Anxiety Symptoms:  Excessive Worry, Psychotic Symptoms:  Hallucinations: Visual Seeing spots and seeing people's face different/ morphing of eyes and shap. Watching tv is the same way Paranoia, PTSD Symptoms: NA Total Time spent with patient: 45 minutes  Past Psychiatric History: bipolar disorder mixed, ADHD, anxiety, intentional overdose  Is the patient at risk to self? Yes.    Has the patient been a risk to self in the past 6 months? Yes.    Has the patient been a risk to self within the distant past? Yes.    Is the patient a risk to others? No.  Has the patient been a risk to others in the past 6 months? No.  Has the patient been a risk to others within the distant past? No.   [place] Scale:  [place] Admission (Current) from 12/08/2022 in [facility] ED from 12/06/2022 in [facility] Emergency Department at [place] Management from 03/02/2022 in [facility] Primary Care at [place] Low Risk High Risk High Risk        Prior Inpatient Therapy: Yes.   If yes, describe reports being at [facility] age 26 years old.   Prior Outpatient Therapy: Yes.   If yes, describe [facility]   Alcohol Screening: 1. How often do you have a drink containing alcohol?: Never 2. How many drinks containing alcohol do you have on a typical day when you are drinking?: 1 or 2 3. How often do you have six or more drinks on one occasion?: Never AUDIT-C Score: 0 4. How often during the last year have you found that you were not able to stop drinking once you had started?: Never 5. How often during the last year have you failed to do what was normally expected from you because of drinking?: Never 6. How often during the last year have you needed a first drink in the morning to get yourself going after a heavy drinking session?: Never 7. How often during the last year have you had a feeling of guilt of remorse after drinking?: Never 8. How often during the last year have you been unable to remember what happened the night before because you had been drinking?: Never 9. Have you or someone else been injured as a  result of your drinking?: No 10. Has a relative or friend or a doctor or another health worker been concerned about your drinking or suggested you cut down?: No Alcohol Use Disorder Identification Test Final Score (AUDIT): 0 Alcohol Brief Interventions/Follow-up: Alcohol education/Brief advice Substance Abuse History in the last 12 months:  No. Denies any use in past 12 months.  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   Outbursts of anger    Preventative health care 10/03/2011   Tobacco abuse 04/28/2011    Past Surgical History:  Procedure Laterality Date   NASAL SEPTUM SURGERY Bilateral    Dr Redmond Baseman 2017   punctured lung     toe surgeries  during childhood    for ingrown toenails   Family History:  Family History  Problem Relation Age of Onset   Depression Mother    Anxiety disorder Mother    Depression Brother    Diabetes Brother        type 1   Cancer Paternal Grandmother        lung/ didn't smoke   Cancer Maternal Uncle 63       colon cancer   Family Psychiatric History: Denies any  Tobacco Screening:  Social History   Tobacco Use  Smoking Status Former   Packs/day: 0.30   Years: 20.00   Total pack years: 6.00   Types: Cigarettes   Passive exposure: Current  Smokeless Tobacco Never  Tobacco Comments   Discussed Smoking Cessation and Alcohol and Drug Services    BH Tobacco Counseling     Are you interested in Tobacco Cessation Medications?  No value filed. Counseled patient on smoking cessation:  No value filed. Reason Tobacco Screening Not Completed: No value filed.       Social History:  Social History   Substance and Sexual Activity  Alcohol Use Yes   Alcohol/week: 36.0 standard drinks of alcohol   Types: 36 Cans of beer per week   Comment: sober x68yr No alcohol.     Social History   Substance and Sexual Activity  Drug Use Yes   Types: Marijuana, Other-see comments   Comment: pt stopped 4 year ago    Additional Social History: Born with left hand deformity. Reports multiple traumas to the head.   Allergies:   Allergies   Allergen Reactions   Haloperidol Decanoate    Hydroxyzine Anxiety    EXCESSIVE ANXIOUNESS   Other Other (See Comments)    Reverse effect, super hyper   Penicillins Hives   Valium    Lab Results:  Results for orders placed or performed during the hospital encounter of 12/08/22 (from the past 48 hour(s))  Hemoglobin A1c     Status: Abnormal   Collection Time: 12/09/22  6:44 AM  Result Value Ref Range   Hgb A1c MFr Bld 4.6 (L) 4.8 - 5.6 %    Comment: (NOTE) Pre diabetes:          5.7%-6.4%  Diabetes:              >  6.4%  Glycemic control for   <7.0% adults with diabetes    Mean Plasma Glucose 85.32 mg/dL    Comment: Performed at [facility] [address], [place], [place] [address]  TSH     Status: None   Collection Time: 12/09/22  6:44 AM  Result Value Ref Range   TSH 1.766 0.350 - 4.500 uIU/mL    Comment: Performed by a 3rd Generation assay with a functional sensitivity of <=0.01 uIU/mL. Performed at [facility], [place] [address], [place], [place] [address]   Comprehensive metabolic panel     Status: Abnormal   Collection Time: 12/09/22  6:44 AM  Result Value Ref Range   Sodium 137 135 - 145 mmol/L   Potassium 3.7 3.5 - 5.1 mmol/L   Chloride 99 98 - 111 mmol/L   CO2 26 22 - 32 mmol/L   Glucose, Bld 110 (H) 70 - 99 mg/dL    Comment: Glucose reference range applies only to samples taken after fasting for at least 8 hours.   BUN <5 (L) 6 - 20 mg/dL   Creatinine, Ser 0.82 0.61 - 1.24 mg/dL   Calcium 9.8 8.9 - 10.3 mg/dL   Total Protein 8.2 (H) 6.5 - 8.1 g/dL   Albumin 4.9 3.5 - 5.0 g/dL   AST 20 15 - 41 U/L   ALT 13 0 - 44 U/L   Alkaline Phosphatase 55 38 - 126 U/L   Total Bilirubin 0.9 0.3 - 1.2 mg/dL   GFR, Estimated >60 >60 mL/min    Comment: (NOTE) Calculated using the CKD-EPI Creatinine Equation (2021)    Anion gap 12 5 - 15    Comment: Performed at [facility], [place] [address], [place], [place] [address]  CBC with  Differential/Platelet     Status: None   Collection Time: 12/09/22  6:44 AM  Result Value Ref Range   WBC 8.0 4.0 - 10.5 K/uL   RBC 5.20 4.22 - 5.81 MIL/uL   Hemoglobin 14.9 13.0 - 17.0 g/dL   HCT 43.8 39.0 - 52.0 %   MCV 84.2 80.0 - 100.0 fL   MCH 28.7 26.0 - 34.0 pg   MCHC 34.0 30.0 - 36.0 g/dL   RDW 13.1 11.5 - 15.5 %   Platelets 304 150 - 400 K/uL   nRBC 0.0 0.0 - 0.2 %   Neutrophils Relative % 52 %   Neutro Abs 4.2 1.7 - 7.7 K/uL   Lymphocytes Relative 33 %   Lymphs Abs 2.7 0.7 - 4.0 K/uL   Monocytes Relative 10 %   Monocytes Absolute 0.8 0.1 - 1.0 K/uL   Eosinophils Relative 4 %   Eosinophils Absolute 0.4 0.0 - 0.5 K/uL   Basophils Relative 1 %   Basophils Absolute 0.1 0.0 - 0.1 K/uL   Immature Granulocytes 0 %   Abs Immature Granulocytes 0.02 0.00 - 0.07 K/uL    Comment: Performed at [facility], [place] [address] [place],  [address]  Lipid panel     Status: Abnormal   Collection Time: 12/09/22  6:44 AM  Result Value Ref Range   Cholesterol 191 0 - 200 mg/dL   Triglycerides 42 <150 mg/dL   HDL 60 >40 mg/dL   Total CHOL/HDL Ratio 3.2 RATIO   VLDL 8 0 - 40 mg/dL   LDL Cholesterol 123 (H) 0 - 99 mg/dL    Comment:        Total Cholesterol/HDL:CHD Risk Coronary Heart Disease  Risk Table                     Men   Women  1/2 Average Risk   3.4   3.3  Average Risk       5.0   4.4  2 X Average Risk   9.6   7.1  3 X Average Risk  23.4   11.0        Use the calculated Patient Ratio above and the CHD Risk Table to determine the patient's CHD Risk.        ATP III CLASSIFICATION (LDL):  <100     mg/dL   Optimal  100-129  mg/dL   Near or Above                    Optimal  130-159  mg/dL   Borderline  160-189  mg/dL   High  >190     mg/dL   Very High Performed at [facility] [address] [place], [place] [address]   Rapid urine drug screen (hospital performed)     Status: Abnormal   Collection Time: 12/09/22 11:48 AM   Result Value Ref Range   Opiates NONE DETECTED NONE DETECTED   Cocaine NONE DETECTED NONE DETECTED   Benzodiazepines POSITIVE (A) NONE DETECTED   Amphetamines POSITIVE (A) NONE DETECTED   Tetrahydrocannabinol NONE DETECTED NONE DETECTED   Barbiturates NONE DETECTED NONE DETECTED    Comment: (NOTE) DRUG SCREEN FOR MEDICAL PURPOSES ONLY.  IF CONFIRMATION IS NEEDED FOR ANY PURPOSE, NOTIFY LAB WITHIN 5 DAYS.  LOWEST DETECTABLE LIMITS FOR URINE DRUG SCREEN Drug Class                     Cutoff (ng/mL) Amphetamine and metabolites    1000 Barbiturate and metabolites    200 Benzodiazepine                 200 Opiates and metabolites        300 Cocaine and metabolites        300 THC                            50 Performed at [facility], [place] [address] [place], [place] [address]     Blood Alcohol level:  Lab Results  Component Value Date   ETH <11 02/10/2013   ETH (H) 12/25/2008    181        LOWEST DETECTABLE LIMIT FOR SERUM ALCOHOL IS 5 mg/dL FOR MEDICAL PURPOSES ONLY    Metabolic Disorder Labs:  Lab Results  Component Value Date   HGBA1C 4.6 (L) 12/09/2022   MPG 85.32 12/09/2022   MPG 97 06/23/2020   No results found for: "PROLACTIN" Lab Results  Component Value Date   CHOL 191 12/09/2022   TRIG 42 12/09/2022   HDL 60 12/09/2022   CHOLHDL 3.2 12/09/2022   VLDL 8 12/09/2022   LDLCALC 123 (H) 12/09/2022   LDLCALC 109 (H) 01/31/2022    Current Medications: Current Facility-Administered Medications  Medication Dose Route Frequency Provider Last Rate Last Admin   acetaminophen (TYLENOL) tablet 650 mg  650 mg Oral Q6H PRN Motley-Mangrum, Jadeka A, PMHNP   650 mg at 12/09/22 0624   alum & mag hydroxide-simeth (MAALOX/MYLANTA) 200-200-20 MG/5ML suspension 30 mL  30 mL Oral Q4H PRN Motley-Mangrum, Al Pimple, PMHNP  amphetamine-dextroamphetamine (ADDERALL XR) 24 hr capsule 30 mg  30 mg Oral q morning Motley-Mangrum, Jadeka A, PMHNP   30 mg at  12/09/22 [id number]   busPIRone (BUSPAR) tablet 5 mg  5 mg Oral BID PRN Motley-Mangrum, Donneta Romberg A, PMHNP   5 mg at 12/08/22 1450   escitalopram (LEXAPRO) tablet 20 mg  20 mg Oral Daily Minda Ditto, RPH   20 mg at 12/09/22 0803   fluticasone (FLONASE) 50 MCG/ACT nasal spray 2 spray  2 spray Each Nare Daily PRN Motley-Mangrum, Donneta Romberg A, PMHNP       hydrOXYzine (ATARAX) tablet 25 mg  25 mg Oral Q6H PRN Leevy-Johnson, Jeweldean Drohan A, NP       loperamide (IMODIUM) capsule 2-4 mg  2-4 mg Oral PRN Leevy-Johnson, Zarinah Oviatt A, NP       LORazepam (ATIVAN) tablet 1 mg  1 mg Oral TID Dian Situ, MD       Followed by   Derrill Memo ON 12/10/2022] LORazepam (ATIVAN) tablet 1 mg  1 mg Oral BID Winfred Leeds, Nadir, MD       Followed by   Derrill Memo ON 12/11/2022] LORazepam (ATIVAN) tablet 0.5 mg  0.5 mg Oral BID Attiah, Nadir, MD       magnesium hydroxide (MILK OF MAGNESIA) suspension 30 mL  30 mL Oral Daily PRN Motley-Mangrum, Jadeka A, PMHNP       multivitamin with minerals tablet 1 tablet  1 tablet Oral q morning Motley-Mangrum, Jadeka A, PMHNP   1 tablet at 12/08/22 1450   OLANZapine (ZYPREXA) tablet 15 mg  15 mg Oral QHS Motley-Mangrum, Jadeka A, PMHNP   15 mg at 12/08/22 2113   OLANZapine zydis (ZYPREXA) disintegrating tablet 5 mg  5 mg Oral Q8H PRN Motley-Mangrum, Jadeka A, PMHNP       And   ziprasidone (GEODON) injection 20 mg  20 mg Intramuscular PRN Motley-Mangrum, Jadeka A, PMHNP       omega-3 acid ethyl esters (LOVAZA) capsule 2 g  2 g Oral Daily Motley-Mangrum, Jadeka A, PMHNP   2 g at 12/09/22 0804   ondansetron (ZOFRAN-ODT) disintegrating tablet 4 mg  4 mg Oral Q6H PRN Leevy-Johnson, Navah Grondin A, NP       thiamine (Vitamin B-1) tablet 100 mg  100 mg Oral Daily Leevy-Johnson, Paraskevi Funez A, NP   100 mg at 12/09/22 0804   traZODone (DESYREL) tablet 50 mg  50 mg Oral QHS PRN Motley-Mangrum, Jadeka A, PMHNP   50 mg at 12/08/22 2112   PTA Medications: Medications Prior to Admission  Medication Sig Dispense Refill Last Dose    ALPRAZolam (XANAX XR) 3 MG 24 hr tablet Take 1 tablet (3 mg total) by mouth daily. 30 tablet 0    amphetamine-dextroamphetamine (ADDERALL XR) 30 MG 24 hr capsule Take 30 mg by mouth every morning.      cholecalciferol (VITAMIN D) 1000 UNITS tablet Take 1,000 Units by mouth every morning.      escitalopram (LEXAPRO) 20 MG tablet Take 1 tablet daily 30 tablet 1    fish oil-omega-3 fatty acids 1000 MG capsule Take 2 g by mouth daily.      fluticasone (FLONASE) 50 MCG/ACT nasal spray SPRAY 2 SPRAYS INTO BOTH NOSTRILS DAILY AS NEEDED FOR ALLERGIES OR RHINITIS. (Patient taking differently: Place 2 sprays into both nostrils daily as needed for allergies.) 48 mL 2    lisdexamfetamine (VYVANSE) 50 MG capsule TAKE 1 CAPSULE BY MOUTH EVERY DAY (Patient not taking: Reported on 12/07/2022) 30 capsule 0    lisdexamfetamine (  VYVANSE) 50 MG capsule Take 1 capsule (50 mg total) by mouth daily. (Patient not taking: Reported on 12/07/2022) 30 capsule 0    Multiple Vitamin (MULTIVITAMIN) tablet Take 1 tablet by mouth every morning.      OLANZapine (ZYPREXA) 15 MG tablet Take 15 mg by mouth at bedtime.      vitamin B-12 (CYANOCOBALAMIN) 1000 MCG tablet Take 1,000 mcg by mouth every morning.      vitamin C (ASCORBIC ACID) 500 MG tablet Take 500 mg by mouth daily.      Musculoskeletal: Strength & Muscle Tone: within normal limits Gait & Station: normal Patient leans: N/A  Psychiatric Specialty Exam:  Presentation  General Appearance:  Casual  Eye Contact: Good  Speech: Clear and Coherent  Speech Volume: Normal  Handedness: Right   Mood and Affect  Mood: Euthymic  Affect: Flat   Thought Process  Thought Processes: Coherent  Duration of Psychotic Symptoms: reports depressive symptoms for 2 years.  Past Diagnosis of Schizophrenia or Psychoactive disorder: No  Descriptions of Associations:Intact  Orientation:Full (Time, Place and Person)  Thought  Content:Logical  Hallucinations:Hallucinations: None  Ideas of Reference:None  Suicidal Thoughts:Suicidal Thoughts: No  Homicidal Thoughts:Homicidal Thoughts: No   Sensorium  Memory: Immediate Good; Recent Good  Judgment: Fair  Insight: Fair   Community education officer  Concentration: Good  Attention Span: Good  Recall: Good  Fund of Knowledge: Good  Language: Good   Psychomotor Activity  Psychomotor Activity: Psychomotor Activity: Normal   Assets  Assets: Communication Skills; Housing; Physical Health; Resilience; Social Support  Sleep  Sleep: Sleep: Fair  Physical Exam: Physical Exam Vitals and nursing note reviewed.  Constitutional:      Appearance: He is normal weight.  HENT:     Head: Normocephalic.     Nose: Nose normal.     Mouth/Throat:     Mouth: Mucous membranes are moist.     Pharynx: Oropharynx is clear.  Eyes:     Comments: B/l pupils enlarged; react to light  Cardiovascular:     Rate and Rhythm: Normal rate.     Pulses: Normal pulses.  Pulmonary:     Effort: Pulmonary effort is normal.  Abdominal:     Palpations: Abdomen is soft.  Musculoskeletal:     Cervical back: Normal range of motion.     Comments: Born with left hand missing  Skin:    General: Skin is warm and dry.  Neurological:     Mental Status: He is alert and oriented to person, place, and time.  Psychiatric:        Attention and Perception: Attention and perception normal.        Mood and Affect: Mood is depressed.        Speech: Speech normal.        Behavior: Behavior normal. Behavior is cooperative.        Thought Content: Thought content is not paranoid or delusional. Thought content does not include homicidal or suicidal ideation. Thought content does not include homicidal or suicidal plan.        Cognition and Memory: Cognition normal. He exhibits impaired recent memory.        Judgment: Judgment is impulsive.    Review of Systems  HENT:          Mydriases  Psychiatric/Behavioral:  Positive for depression, hallucinations and memory loss.   All other systems reviewed and are negative.  Blood pressure 127/89, pulse 89, temperature 98.1 F (36.7 C), temperature source Oral, resp.  rate 16, height 6' 2"$  (1.88 m), weight 86.2 kg, SpO2 98 %. Body mass index is 24.39 kg/m.  Treatment Plan Summary: Daily contact with patient to assess and evaluate symptoms and progress in treatment, Medication management, and Plan   Medications:  Continue:   - Escitalopram 20 mg daily;  depression             - Lorazepam Detox Protocol in place             - Agitation Protocol: Olanzapine 5  mg,  Geodon 20 mg IM - Lovaza 2 mg daily - multivitamin tablet daily   PRN:              - Trazodone 50 mg PRN HS sleep             - Buspar 5 mg BID PRN anxiety             - Acetaminophen 650 mg q6hrs  PRN mild pain             - Maalox/Mylanta 30 mL q4hrs PRN  indigestion             - Flonase 50 mcg/ACT 2 spray daily  PRN allergies - Milk of Magnesia 30 mL daily PRN mild constipation   Observation Level/Precautions:  15 minute checks  Laboratory:  CBC Chemistry Profile HbAIC UDS UA, EKG  Psychotherapy:  group, milieu  Medications:  see MAR  Consultations:   Social Work  Discharge Concerns:  safety  Estimated LOS: 7-10 days  Other:     Physician Treatment Plan for Primary Diagnosis: Bipolar disorder, mixed ([place]) Long Term Goal(s): Improvement in symptoms so as ready for discharge  Short Term Goals: Ability to identify changes in lifestyle to reduce recurrence of condition will improve, Ability to verbalize feelings will improve, Ability to disclose and discuss suicidal ideas, Ability to demonstrate self-control will improve, Ability to identify and develop effective coping behaviors will improve, Ability to maintain clinical measurements within normal limits will improve, Compliance with prescribed medications will improve, and Ability to  identify triggers associated with substance abuse/mental health issues will improve  Physician Treatment Plan for Secondary Diagnosis: Principal Problem:   Bipolar disorder, mixed ([place]) Active Problems:   Bipolar affect, depressed ([place])  Long Term Goal(s): Improvement in symptoms so as ready for discharge  Short Term Goals: Ability to identify changes in lifestyle to reduce recurrence of condition will improve, Ability to verbalize feelings will improve, Ability to disclose and discuss suicidal ideas, Ability to demonstrate self-control will improve, Ability to identify and develop effective coping behaviors will improve, Ability to maintain clinical measurements within normal limits will improve, Compliance with prescribed medications will improve, and Ability to identify triggers associated with substance abuse/mental health issues will improve  I certify that inpatient services furnished can reasonably be expected to improve the patient's condition.    Inda Merlin, NP 2/11/20242:11 PM

## 2022-12-09 NOTE — Progress Notes (Signed)
Urine sample collected and placed in refrigerator due to being unable to print requisition. Will notify oncoming shift.

## 2022-12-10 ENCOUNTER — Encounter (HOSPITAL_COMMUNITY): Payer: Self-pay

## 2022-12-10 DIAGNOSIS — G4700 Insomnia, unspecified: Secondary | ICD-10-CM | POA: Diagnosis present

## 2022-12-10 DIAGNOSIS — F1320 Sedative, hypnotic or anxiolytic dependence, uncomplicated: Secondary | ICD-10-CM | POA: Diagnosis present

## 2022-12-10 DIAGNOSIS — F411 Generalized anxiety disorder: Secondary | ICD-10-CM | POA: Diagnosis present

## 2022-12-10 DIAGNOSIS — F314 Bipolar disorder, current episode depressed, severe, without psychotic features: Secondary | ICD-10-CM | POA: Diagnosis not present

## 2022-12-10 MED ORDER — GABAPENTIN 100 MG PO CAPS
100.0000 mg | ORAL_CAPSULE | Freq: Three times a day (TID) | ORAL | Status: DC
  Administered 2022-12-10 – 2022-12-11 (×3): 100 mg via ORAL
  Filled 2022-12-10 (×10): qty 1

## 2022-12-10 MED ORDER — ENSURE ENLIVE PO LIQD
237.0000 mL | Freq: Three times a day (TID) | ORAL | Status: DC
  Administered 2022-12-10 – 2022-12-20 (×15): 237 mL via ORAL
  Filled 2022-12-10 (×33): qty 237

## 2022-12-10 NOTE — Progress Notes (Signed)
Pt attending groups and medication compliant. Pt denies withdrawal symptoms. Pt denies anxiety this morning. Pt denies SI/HI/self harm thoughts. Pt denies a/v hallucinations. Q 15 minute checks ongoing for safety.

## 2022-12-10 NOTE — [facility] Counselor (Signed)
Adult Comprehensive Assessment  Patient ID: Jesse Jones, male   DOB: 06/02/1981, 42 y.o.   MRN: [id number]  Information Source: Information source: Patient  Current Stressors:  Patient states their primary concerns and needs for treatment are:: "Depression, anxiety, and suicidal thoughts" Patient states their goals for this hospitilization and ongoing recovery are:: "Coping skills for my depression and anxiety" Educational / Learning stressors: Pt reports having a 12th grade education Employment / Job issues: Pt reports receiving SSDI for 5 to 10 years Family Relationships: Pt reports no stressors Financial / Lack of resources (include bankruptcy): Pt reports receiving SSDI, Medicaid, Medicare, and Food Stamps Housing / Lack of housing: Pt reports living alone in his own apartment Physical health (include injuries & life threatening diseases): 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 Social relationships: Pt reports having few social supports Substance abuse: Pt denies any substance use Bereavement / Loss: Pt reports no stressors  Living/Environment/Situation:  Living Arrangements: Alone Living conditions (as described by patient or guardian): [place] Who else lives in the home?: Alone How long has patient lived in current situation?: 4 years What is atmosphere in current home: Comfortable  Family History:  Marital status: Single Are you sexually active?: No What is your sexual orientation?: Heterosexual Has your sexual activity been affected by drugs, alcohol, medication, or emotional stress?: No Does patient have children?: No  Childhood History:  By whom was/is the patient raised?: Both parents Description of patient's relationship with caregiver when they were a child: "It was pretty bad, I was a frustrated kid and I argued alot" Patient's description of current relationship with people who raised him/her: "It is good now, our relationship improved a  lot" How were you disciplined when you got in trouble as a child/adolescent?: Spankings and Groundings Does patient have siblings?: Yes Number of Siblings: 1 Description of patient's current relationship with siblings: "I have a younger brother and we get along good" Did patient suffer any verbal/emotional/physical/sexual abuse as a child?: No Did patient suffer from severe childhood neglect?: No Has patient ever been sexually abused/assaulted/raped as an adolescent or adult?: No Was the patient ever a victim of a crime or a disaster?: No Witnessed domestic violence?: No Has patient been affected by domestic violence as an adult?: No  Education:  Highest grade of school patient has completed: 12th grade Currently a student?: No Learning disability?: Yes What learning problems does patient have?: ADHD  Employment/Work Situation:   Employment Situation: On disability Why is Patient on Disability: Physical and mental health (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) How Long has Patient Been on Disability: 5 to 10 years Patient's Job has Been Impacted by Current Illness: No What is the Longest Time Patient has Held a Job?: 3 years 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" Has Patient ever Been in the [employer]?: No  Financial Resources:   Museum/gallery curator resources: Teacher, early years/pre, [employer], Medicaid, Medicare Does patient have a Programmer, applications or guardian?: No  Alcohol/Substance Abuse:   What has been your use of drugs/alcohol within the last 12 months?: Pt denies any substance use If attempted suicide, did drugs/alcohol play a role in this?: No Alcohol/Substance Abuse Treatment Hx: Denies past history Has alcohol/substance abuse ever caused legal problems?: No  Social Support System:   Pensions consultant Support System: Fair Dietitian Support System: "Family and best friend" Type of faith/religion: "Catholic" How does patient's faith help to cope  with current illness?: "It's with  me everyday, meditation, and prayer"  Leisure/Recreation:   Do You Have Hobbies?: Yes Leisure and Hobbies: "Mountain biking and video games"  Strengths/Needs:   What is the patient's perception of their strengths?: "Being easy going" Patient states they can use these personal strengths during their treatment to contribute to their recovery: "I can listen and take advice the easy way" Patient states these barriers may affect/interfere with their treatment: None Patient states these barriers may affect their return to the community: None Other important information patient would like considered in planning for their treatment: None  Discharge Plan:   Currently receiving community mental health services: Yes (From Whom) ([facility]) Patient states concerns and preferences for aftercare planning are: Pt would like to remain with his current providers at [facility] for therapy and psychiatry Patient states they will know when they are safe and ready for discharge when: "When I am not as anxious or depressed" Does patient have access to transportation?: Yes (Parents) Does patient have financial barriers related to discharge medications?: No Will patient be returning to same living situation after discharge?: Yes  Summary/Recommendations:   Summary and Recommendations (to be completed by the evaluator): Jesse Jones is a 42 year old, male, who was admitted to the hospital due to worsening depression, anxiety, and suicidal thoughts. The Pt denies any current or previous suicide attempts and states that he took "1 Xanax and 3 Alprazolam pills to help me sleep".  He states that his depression and anxiety have worsened over the past 2 years due to "Isolating myself and having no motivation".  The Pt reports living in his own apartment in the [place] area.  He states that he is close with his mother, father, brother, and best friend.  He denies  any childhood abuse or trauma but does state "I was a frustrated kid and I argued a lot". The Pt reports receiving SSDI, Medicaid, and Medicare due to his physical and mental health.  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  He states that he has received SSDI for approximately 5 to 10 years.  He also reports having a 12th grade education and a diagnosis of ADHD.  The Pt denies any current or previous substance use, as well as any current or previous substance use treatment. While in the hospital the Pt can benefit from crisis stabilization, medication evaluation, group therapy, psycho-education, case management, and discharge planning. Upon discharge the Pt would like to return to his apartment. It is recommended that the Pt follow-up with his current providers at [place] for therapy and psychiatry. It is also recommended that the Pt continue to take all medications as prescribed until directed to do otherwise by his providers.  At discharge it is recommended that the patient adhere to the established aftercare plan.  Darleen Crocker. 12/10/2022

## 2022-12-10 NOTE — Progress Notes (Signed)
   12/10/22 2030  Psych Admission Type (Psych Patients Only)  Admission Status Involuntary  Psychosocial Assessment  Patient Complaints None  Eye Contact Fair  Facial Expression Animated  Affect Appropriate to circumstance  Speech Logical/coherent  Interaction Assertive  Motor Activity Slow  Appearance/Hygiene Unremarkable  Behavior Characteristics Cooperative  Mood Depressed  Aggressive Behavior  Effect No apparent injury  Thought Process  Coherency WDL  Content WDL  Delusions WDL  Perception WDL  Hallucination None reported or observed  Judgment Limited  Confusion None  Danger to Self  Current suicidal ideation? Denies  Danger to Others  Danger to Others None reported or observed

## 2022-12-10 NOTE — Progress Notes (Signed)
Adult Psychoeducational Group Note  Date:  12/10/2022 Time:  9:52 AM  Group Topic/Focus:  Goals Group:   The focus of this group is to help patients establish daily goals to achieve during treatment and discuss how the patient can incorporate goal setting into their daily lives to aide in recovery.  Participation Level:  Active  Participation Quality:  Appropriate and Attentive  Affect:  Appropriate  Cognitive:  Appropriate  Insight: Appropriate  Engagement in Group:  Engaged  Modes of Intervention:  Activity, Discussion, and Education  Additional Comments:   Pt  attended and actively participated in the goals group.  Wetzel Bjornstad Myles Mallicoat 12/10/2022, 9:52 AM

## 2022-12-10 NOTE — Progress Notes (Signed)
[facility] MD Progress Note  12/10/2022 2:34 PM Jesse Jones  MRN:  [id number] Principal Problem: Severe bipolar I disorder, current or most recent episode depressed ([place]) Diagnosis: Principal Problem:   Severe bipolar I disorder, current or most recent episode depressed ([place]) Active Problems:   ADHD (attention deficit hyperactivity disorder)   GAD (generalized anxiety disorder)   Benzodiazepine dependence ([place])   Insomnia  Reason for admission: Jesse Jones is a 42 yo Caucasian male with past mental health history of ADHD, alcohol use disorder in sustained remission & GAD who presented to the [facility] on 02/08 after intentionally taking 12 mg of Xanax in a suicide attempt.  Patient is a poor historian, and initially stated he did not know why he overdosed, but then stated it was because his parents got into an argument over his medications.  Patient was IVC'd and transferred to this behavioral health Hospital for treatment and stabilization of his mental status.  24 hr chart review: Vital signs within normal limits, patient has been compliant with scheduled medications, and remains on the Ativan detox protocol for benzodiazepine use.  He is attending unit group sessions and participating.  He required hydroxyzine 25 mg last night for anxiety.  No behavioral concerns noted in the past 24 hours.  Patient assessment note, 12/10/2022: On assessment today, patient denies SI, denies HI, denies AVH.  He denies paranoia, and there is no evidence of delusional thinking. Mood remains depressed mood, attention to personal hygiene and grooming is fair, eye contact is good, speech is clear & coherent. Thought contents are organized and logical.  Patient reports that his mood has slightly improved since hospitalization.  He reports good sleep quality last night, reports feeling less anxious.  He reports appetite is remaining poor, and is asking for Ensure nutritional shakes.  Reports some tremors in  bilateral legs and states that they have been present for 3 months now, but states that they are getting better with the detox protocol medication that he is on currently.  Hydration encouraged, as patient is noted to have very dry mouth.  He has been provided a pitcher of ice water, and educated on the need to get nursing staff to fill it as often as needed.  Patient denies any medication related side effects currently.  We are discontinuing Adderall 30 mg as there is no indication for this medication at this time, and patient's symptoms need to be assessed without stimulants aboard.  We are continuing Lexapro 20 mg for management of depressive symptoms, and continuing Zyprexa 15 mg nightly.  We will continue detox protocol, and reevaluate need for continued detox once current protocol is completed.  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   Outbursts of anger    Preventative health care 10/03/2011   Tobacco abuse 04/28/2011    Past Surgical History:  Procedure Laterality Date   NASAL SEPTUM SURGERY Bilateral    Dr Redmond Baseman 2017   punctured lung  toe surgeries  during childhood    for ingrown toenails   Family History:  Family History  Problem Relation Age of Onset   Depression Mother    Anxiety disorder Mother    Depression Brother    Diabetes Brother        type 1   Cancer Paternal Grandmother        lung/ didn't smoke   Cancer Maternal Uncle 56       colon cancer   Family Psychiatric  History: See  above Social History:  Social History   Substance and Sexual Activity  Alcohol Use Yes   Alcohol/week: 36.0 standard drinks of alcohol   Types: 36 Cans of beer per week   Comment: sober x49yr No alcohol.     Social History   Substance and Sexual Activity  Drug Use Yes   Types: Marijuana, Other-see comments   Comment: pt stopped 4 year ago    Social History   Socioeconomic History   Marital status: Single    Spouse name: Not on file   Number of children: 0   Years of education: 12   Highest education level: 12th grade  Occupational History   Not on file  Tobacco Use   Smoking status: Former    Packs/day: 0.30    Years: 20.00    Total pack years: 6.00    Types: Cigarettes    Passive exposure: Current   Smokeless tobacco: Never   Tobacco comments:    Discussed Smoking Cessation and Alcohol and Drug Services  Vaping Use   Vaping Use: Never used  Substance and Sexual Activity   Alcohol use: Yes    Alcohol/week: 36.0 standard drinks of alcohol    Types: 36 Cans of beer per week    Comment: sober x473yrNo alcohol.   Drug use: Yes    Types: Marijuana, Other-see comments    Comment: pt stopped 4 year ago   Sexual activity: Yes    Partners: Female    Comment: lives alone and eating well. exercising  Other Topics Concern   Not on file  Social History Narrative   Not on file   Social Determinants of Health   Financial Resource Strain: Low Risk  (03/02/2022)   Overall Financial Resource Strain (CARDIA)    Difficulty of Paying Living Expenses: Not hard at all  Food Insecurity: No Food Insecurity (12/08/2022)   Hunger Vital Sign    Worried About Running Out of Food in the Last Year: Never true    Ran Out of Food in the Last Year: Never true  Transportation Needs: No Transportation Needs (12/08/2022)   PRAPARE - TrHydrologistMedical): No    Lack of Transportation (Non-Medical): No  Physical Activity: Inactive (03/02/2022)   Exercise Vital  Sign    Days of Exercise per Week: 0 days    Minutes of Exercise per Session: 0 min  Stress: Stress Concern Present (03/02/2022)   [place]  Feeling of Stress : Very much  Social Connections: Socially Isolated (03/02/2022)   Social Connection and Isolation Panel [NHANES]    Frequency of Communication with Friends and Family: More than three times a week    Frequency of Social Gatherings with Friends and Family: More than three times a week    Attends Religious Services: Never    AcMarine scientistr Organizations: No    Attends ClArchivist  Meetings: Never    Marital Status: Never married   sleep: Good  Appetite:  Poor  Current Medications: Current Facility-Administered Medications  Medication Dose Route Frequency Provider Last Rate Last Admin   acetaminophen (TYLENOL) tablet 650 mg  650 mg Oral Q6H PRN Motley-Mangrum, Jadeka A, PMHNP   650 mg at 12/09/22 0624   alum & mag hydroxide-simeth (MAALOX/MYLANTA) 200-200-20 MG/5ML suspension 30 mL  30 mL Oral Q4H PRN Motley-Mangrum, Jadeka A, PMHNP       escitalopram (LEXAPRO) tablet 20 mg  20 mg Oral Daily Nyoka Cowden, Terri L, RPH   20 mg at 12/10/22 0815   feeding supplement (ENSURE ENLIVE / ENSURE PLUS) liquid 237 mL  237 mL Oral TID BM Kaleth Koy, NP       fluticasone (FLONASE) 50 MCG/ACT nasal spray 2 spray  2 spray Each Nare Daily PRN Motley-Mangrum, Jadeka A, PMHNP       gabapentin (NEURONTIN) capsule 100 mg  100 mg Oral TID Nicholes Rough, NP       hydrOXYzine (ATARAX) tablet 25 mg  25 mg Oral Q6H PRN Leevy-Johnson, Brooke A, NP   25 mg at 12/09/22 2102   loperamide (IMODIUM) capsule 2-4 mg  2-4 mg Oral PRN Leevy-Johnson, Brooke A, NP       LORazepam (ATIVAN) tablet 1 mg  1 mg Oral BID Winfred Leeds, Nadir, MD       Followed by   Derrill Memo ON 12/11/2022] LORazepam (ATIVAN) tablet 0.5 mg  0.5 mg Oral BID Attiah, Nadir, MD       magnesium hydroxide (MILK OF MAGNESIA)  suspension 30 mL  30 mL Oral Daily PRN Motley-Mangrum, Jadeka A, PMHNP       multivitamin with minerals tablet 1 tablet  1 tablet Oral q morning Motley-Mangrum, Jadeka A, PMHNP   1 tablet at 12/10/22 1100   OLANZapine (ZYPREXA) tablet 15 mg  15 mg Oral QHS Motley-Mangrum, Jadeka A, PMHNP   15 mg at 12/09/22 2102   OLANZapine zydis (ZYPREXA) disintegrating tablet 5 mg  5 mg Oral Q8H PRN Motley-Mangrum, Jadeka A, PMHNP       And   ziprasidone (GEODON) injection 20 mg  20 mg Intramuscular PRN Motley-Mangrum, Jadeka A, PMHNP       omega-3 acid ethyl esters (LOVAZA) capsule 2 g  2 g Oral Daily Motley-Mangrum, Jadeka A, PMHNP   2 g at 12/10/22 0816   ondansetron (ZOFRAN-ODT) disintegrating tablet 4 mg  4 mg Oral Q6H PRN Leevy-Johnson, Brooke A, NP       thiamine (Vitamin B-1) tablet 100 mg  100 mg Oral Daily Leevy-Johnson, Brooke A, NP   100 mg at 12/10/22 0816   traZODone (DESYREL) tablet 50 mg  50 mg Oral QHS PRN Motley-Mangrum, Al Pimple, PMHNP   50 mg at 12/08/22 2112    Lab Results:  Results for orders placed or performed during the hospital encounter of 12/08/22 (from the past 48 hour(s))  Hemoglobin A1c     Status: Abnormal   Collection Time: 12/09/22  6:44 AM  Result Value Ref Range   Hgb A1c MFr Bld 4.6 (L) 4.8 - 5.6 %    Comment: (NOTE) Pre diabetes:          5.7%-6.4%  Diabetes:              >6.4%  Glycemic control for   <7.0% adults with diabetes    Mean Plasma Glucose 85.32 mg/dL    Comment: Performed at [facility], [place]  [address] [place], [place] [address]  TSH     Status: None   Collection Time: 12/09/22  6:44 AM  Result Value Ref Range   TSH 1.766 0.350 - 4.500 uIU/mL    Comment: Performed by a 3rd Generation assay with a functional sensitivity of <=0.01 uIU/mL. Performed at [facility], [place] [address], [place], [place] [address]   Comprehensive metabolic panel     Status: Abnormal   Collection Time: 12/09/22  6:44 AM  Result Value Ref Range    Sodium 137 135 - 145 mmol/L   Potassium 3.7 3.5 - 5.1 mmol/L   Chloride 99 98 - 111 mmol/L   CO2 26 22 - 32 mmol/L   Glucose, Bld 110 (H) 70 - 99 mg/dL    Comment: Glucose reference range applies only to samples taken after fasting for at least 8 hours.   BUN <5 (L) 6 - 20 mg/dL   Creatinine, Ser 0.82 0.61 - 1.24 mg/dL   Calcium 9.8 8.9 - 10.3 mg/dL   Total Protein 8.2 (H) 6.5 - 8.1 g/dL   Albumin 4.9 3.5 - 5.0 g/dL   AST 20 15 - 41 U/L   ALT 13 0 - 44 U/L   Alkaline Phosphatase 55 38 - 126 U/L   Total Bilirubin 0.9 0.3 - 1.2 mg/dL   GFR, Estimated >60 >60 mL/min    Comment: (NOTE) Calculated using the CKD-EPI Creatinine Equation (2021)    Anion gap 12 5 - 15    Comment: Performed at [facility], [place] [address] [place], [place] [address]  CBC with Differential/Platelet     Status: None   Collection Time: 12/09/22  6:44 AM  Result Value Ref Range   WBC 8.0 4.0 - 10.5 K/uL   RBC 5.20 4.22 - 5.81 MIL/uL   Hemoglobin 14.9 13.0 - 17.0 g/dL   HCT 43.8 39.0 - 52.0 %   MCV 84.2 80.0 - 100.0 fL   MCH 28.7 26.0 - 34.0 pg   MCHC 34.0 30.0 - 36.0 g/dL   RDW 13.1 11.5 - 15.5 %   Platelets 304 150 - 400 K/uL   nRBC 0.0 0.0 - 0.2 %   Neutrophils Relative % 52 %   Neutro Abs 4.2 1.7 - 7.7 K/uL   Lymphocytes Relative 33 %   Lymphs Abs 2.7 0.7 - 4.0 K/uL   Monocytes Relative 10 %   Monocytes Absolute 0.8 0.1 - 1.0 K/uL   Eosinophils Relative 4 %   Eosinophils Absolute 0.4 0.0 - 0.5 K/uL   Basophils Relative 1 %   Basophils Absolute 0.1 0.0 - 0.1 K/uL   Immature Granulocytes 0 %   Abs Immature Granulocytes 0.02 0.00 - 0.07 K/uL    Comment: Performed at [facility], [place] [address] [place], [place] [address]  Lipid panel     Status: Abnormal   Collection Time: 12/09/22  6:44 AM  Result Value Ref Range   Cholesterol 191 0 - 200 mg/dL   Triglycerides 42 <150 mg/dL   HDL 60 >40 mg/dL   Total CHOL/HDL Ratio 3.2 RATIO   VLDL 8 0 - 40 mg/dL    LDL Cholesterol 123 (H) 0 - 99 mg/dL    Comment:        Total Cholesterol/HDL:CHD Risk Coronary Heart Disease Risk Table                     Men   Women  1/2 Average Risk  3.4   3.3  Average Risk       5.0   4.4  2 X Average Risk   9.6   7.1  3 X Average Risk  23.4   11.0        Use the calculated Patient Ratio above and the CHD Risk Table to determine the patient's CHD Risk.        ATP III CLASSIFICATION (LDL):  <100     mg/dL   Optimal  100-129  mg/dL   Near or Above                    Optimal  130-159  mg/dL   Borderline  160-189  mg/dL   High  >190     mg/dL   Very High Performed at [place] [address], [place], [place] [address]   Rapid urine drug screen (hospital performed)     Status: Abnormal   Collection Time: 12/09/22 11:48 AM  Result Value Ref Range   Opiates NONE DETECTED NONE DETECTED   Cocaine NONE DETECTED NONE DETECTED   Benzodiazepines POSITIVE (A) NONE DETECTED   Amphetamines POSITIVE (A) NONE DETECTED   Tetrahydrocannabinol NONE DETECTED NONE DETECTED   Barbiturates NONE DETECTED NONE DETECTED    Comment: (NOTE) DRUG SCREEN FOR MEDICAL PURPOSES ONLY.  IF CONFIRMATION IS NEEDED FOR ANY PURPOSE, NOTIFY LAB WITHIN 5 DAYS.  LOWEST DETECTABLE LIMITS FOR URINE DRUG SCREEN Drug Class                     Cutoff (ng/mL) Amphetamine and metabolites    1000 Barbiturate and metabolites    200 Benzodiazepine                 200 Opiates and metabolites        300 Cocaine and metabolites        300 THC                            50 Performed at [facility] [place] [address], [place], [place] [address]     Blood Alcohol level:  Lab Results  Component Value Date   ETH <11 02/10/2013   ETH (H) 12/25/2008    181        LOWEST DETECTABLE LIMIT FOR SERUM ALCOHOL IS 5 mg/dL FOR MEDICAL PURPOSES ONLY    Metabolic Disorder Labs: Lab Results  Component Value Date   HGBA1C 4.6 (L) 12/09/2022   MPG 85.32  12/09/2022   MPG 97 06/23/2020   No results found for: "PROLACTIN" Lab Results  Component Value Date   CHOL 191 12/09/2022   TRIG 42 12/09/2022   HDL 60 12/09/2022   CHOLHDL 3.2 12/09/2022   VLDL 8 12/09/2022   LDLCALC 123 (H) 12/09/2022   LDLCALC 109 (H) 01/31/2022    Physical Findings: AIMS:  0 CIWA:  CIWA-Ar Total: 0 COWS:     Musculoskeletal: Strength & Muscle Tone: within normal limits Gait & Station: normal Patient leans: N/A  Psychiatric Specialty Exam:  Presentation  General Appearance:  Fairly Groomed  Eye Contact: Fair  Speech: Clear and Coherent  Speech Volume: Normal  Handedness: Right   Mood and Affect  Mood: Depressed; Anxious  Affect: Congruent   Thought Process  Thought Processes: Coherent  Descriptions of Associations:Intact  Orientation:Full (Time, Place and Person)  Thought Content:Logical  History of Schizophrenia/Schizoaffective disorder:No  Duration of Psychotic  Symptoms:No data recorded Hallucinations:Hallucinations: None  Ideas of Reference:None  Suicidal Thoughts:Suicidal Thoughts: No  Homicidal Thoughts:Homicidal Thoughts: No   Sensorium  Memory: Immediate Good  Judgment: Fair  Insight: Fair   Executive Functions  Concentration: Good  Attention Span: Fair  Recall: [place] of Knowledge: Fair  Language: Fair   Psychomotor Activity  Psychomotor Activity: Psychomotor Activity: Normal   Assets  Assets: Communication Skills; Resilience; Social Support   Sleep  Sleep: Sleep: Good    Physical Exam: Physical Exam Constitutional:      Appearance: Normal appearance.  Eyes:     Pupils: Pupils are equal, round, and reactive to light.  Musculoskeletal:        General: Normal range of motion.     Cervical back: Normal range of motion.  Neurological:     Mental Status: He is oriented to person, place, and time.    Review of Systems  Constitutional: Negative.   HENT:  Negative.    Eyes: Negative.   Respiratory: Negative.    Cardiovascular: Negative.   Gastrointestinal: Negative.   Genitourinary: Negative.   Musculoskeletal: Negative.   Skin: Negative.   Neurological: Negative.  Negative for dizziness.  Psychiatric/Behavioral:  Positive for depression and substance abuse. Negative for hallucinations, memory loss and suicidal ideas. The patient is nervous/anxious and has insomnia.    Blood pressure 124/86, pulse 75, temperature 97.8 F (36.6 C), temperature source Oral, resp. rate 16, height 6' 2"$  (1.88 m), weight 86.2 kg, SpO2 98 %. Body mass index is 24.39 kg/m.   Treatment Plan Summary: Treatment Plan Summary: Daily contact with patient to assess and evaluate symptoms and progress in treatment and Medication management  Observation Level/Precautions:  15 minute checks  Laboratory:  Labs reviewed   Psychotherapy:  Unit Group sessions  Medications:  See [facility]  Consultations:  To be determined   Discharge Concerns:  Safety, medication compliance, mood stability  Estimated LOS: 5-7 days  Other:  N/A   Labs independently reviewed on 12/10/2022.   PLAN Safety and Monitoring: Voluntary admission to inpatient psychiatric unit for safety, stabilization and treatment Daily contact with patient to assess and evaluate symptoms and progress in treatment Patient's case to be discussed in multi-disciplinary team meeting Observation Level : q15 minute checks Vital signs: q12 hours Precautions: Safety  Long Term Goal(s): Improvement in symptoms so as ready for discharge  Short Term Goals: Ability to identify changes in lifestyle to reduce recurrence of condition will improve, Ability to disclose and discuss suicidal ideas, Ability to demonstrate self-control will improve, Ability to identify and develop effective coping behaviors will improve, Ability to maintain clinical measurements within normal limits will improve, Compliance with prescribed medications  will improve, and Ability to identify triggers associated with substance abuse/mental health issues will improve  Diagnoses Principal Problem:   Severe bipolar I disorder, current or most recent episode depressed ([place]) Active Problems:   ADHD (attention deficit hyperactivity disorder)   GAD (generalized anxiety disorder)   Benzodiazepine dependence ([facility])   Insomnia  Medications -Discontinue Adderall 30 mg-no indication for stimulant medication at this time -Continue Lexapro 20 mg for depressive symptoms -Continue Zyprexa 15 mg nightly for mood stabilization -Continue Ativan detox protocol-please see MAR for complete order -Start Ensures TID for nutritional support -Discontinue BuSpar -Start Gabapentin 100 mg TID for anxiety -Continue hydroxyzine 25 mg as needed 3 times daily for anxiety -Continue trazodone 50 mg p.o. Ureh nightly for sleep -Continue Lovaza 2 g daily for hyperlipidemia Patient educated on rationales,  benefits and possible side effects of all medications listed above, and agreeable with trials, and plan.  Other PRNS -Continue Tylenol 650 mg every 6 hours PRN for mild pain -Continue Maalox 30 mg every 4 hrs PRN for indigestion -Continue Imodium 2-4 mg as needed for diarrhea -Continue Milk of Magnesia as needed every 6 hrs for constipation -Continue Zofran disintegrating tabs every 6 hrs PRN for nausea    Discharge Planning: Social work and case management to assist with discharge planning and identification of hospital follow-up needs prior to discharge Estimated LOS: 5-7 days Discharge Concerns: Need to establish a safety plan; Medication compliance and effectiveness Discharge Goals: Return home with outpatient referrals for mental health follow-up including medication management/psychotherapy  I certify that inpatient services furnished can reasonably be expected to improve the patient's condition.    Nicholes Rough, NP 2/12/20242:34 PM   Nicholes Rough,  NP 12/10/2022, 2:34 PM

## 2022-12-10 NOTE — BH IP Treatment Plan (Signed)
Interdisciplinary Treatment and Diagnostic Plan Update  12/10/2022 Time of Session: 9:00am  Jesse Jones MRN: [id number]  Principal Diagnosis: Bipolar disorder, mixed ([place])  Secondary Diagnoses: Principal Problem:   Bipolar disorder, mixed ([place]) Active Problems:   Bipolar affect, depressed ([place])   Current Medications:  Current Facility-Administered Medications  Medication Dose Route Frequency Provider Last Rate Last Admin   acetaminophen (TYLENOL) tablet 650 mg  650 mg Oral Q6H PRN Motley-Mangrum, Jadeka A, PMHNP   650 mg at 12/09/22 0624   alum & mag hydroxide-simeth (MAALOX/MYLANTA) 200-200-20 MG/5ML suspension 30 mL  30 mL Oral Q4H PRN Motley-Mangrum, Jadeka A, PMHNP       amphetamine-dextroamphetamine (ADDERALL XR) 24 hr capsule 30 mg  30 mg Oral q morning Motley-Mangrum, Jadeka A, PMHNP   30 mg at 12/10/22 [id number]   busPIRone (BUSPAR) tablet 5 mg  5 mg Oral BID PRN Motley-Mangrum, Jadeka A, PMHNP   5 mg at 12/08/22 1450   escitalopram (LEXAPRO) tablet 20 mg  20 mg Oral Daily Green, Terri L, RPH   20 mg at 12/10/22 0815   fluticasone (FLONASE) 50 MCG/ACT nasal spray 2 spray  2 spray Each Nare Daily PRN Motley-Mangrum, Donneta Romberg A, PMHNP       hydrOXYzine (ATARAX) tablet 25 mg  25 mg Oral Q6H PRN Leevy-Johnson, Brooke A, NP   25 mg at 12/09/22 2102   loperamide (IMODIUM) capsule 2-4 mg  2-4 mg Oral PRN Leevy-Johnson, Brooke A, NP       LORazepam (ATIVAN) tablet 1 mg  1 mg Oral TID Winfred Leeds, Nadir, MD   1 mg at 12/10/22 0815   Followed by   LORazepam (ATIVAN) tablet 1 mg  1 mg Oral BID Winfred Leeds, Nadir, MD       Followed by   Derrill Memo ON 12/11/2022] LORazepam (ATIVAN) tablet 0.5 mg  0.5 mg Oral BID Attiah, Nadir, MD       magnesium hydroxide (MILK OF MAGNESIA) suspension 30 mL  30 mL Oral Daily PRN Motley-Mangrum, Jadeka A, PMHNP       multivitamin with minerals tablet 1 tablet  1 tablet Oral q morning Motley-Mangrum, Jadeka A, PMHNP   1 tablet at 12/08/22 1450   OLANZapine (ZYPREXA) tablet 15  mg  15 mg Oral QHS Motley-Mangrum, Jadeka A, PMHNP   15 mg at 12/09/22 2102   OLANZapine zydis (ZYPREXA) disintegrating tablet 5 mg  5 mg Oral Q8H PRN Motley-Mangrum, Jadeka A, PMHNP       And   ziprasidone (GEODON) injection 20 mg  20 mg Intramuscular PRN Motley-Mangrum, Jadeka A, PMHNP       omega-3 acid ethyl esters (LOVAZA) capsule 2 g  2 g Oral Daily Motley-Mangrum, Jadeka A, PMHNP   2 g at 12/10/22 0816   ondansetron (ZOFRAN-ODT) disintegrating tablet 4 mg  4 mg Oral Q6H PRN Leevy-Johnson, Brooke A, NP       thiamine (Vitamin B-1) tablet 100 mg  100 mg Oral Daily Leevy-Johnson, Brooke A, NP   100 mg at 12/10/22 0816   traZODone (DESYREL) tablet 50 mg  50 mg Oral QHS PRN Motley-Mangrum, Jadeka A, PMHNP   50 mg at 12/08/22 2112   PTA Medications: Medications Prior to Admission  Medication Sig Dispense Refill Last Dose   ALPRAZolam (XANAX XR) 3 MG 24 hr tablet Take 1 tablet (3 mg total) by mouth daily. 30 tablet 0    amphetamine-dextroamphetamine (ADDERALL XR) 30 MG 24 hr capsule Take 30 mg by mouth every morning.  cholecalciferol (VITAMIN D) 1000 UNITS tablet Take 1,000 Units by mouth every morning.      escitalopram (LEXAPRO) 20 MG tablet Take 1 tablet daily 30 tablet 1    fish oil-omega-3 fatty acids 1000 MG capsule Take 2 g by mouth daily.      fluticasone (FLONASE) 50 MCG/ACT nasal spray SPRAY 2 SPRAYS INTO BOTH NOSTRILS DAILY AS NEEDED FOR ALLERGIES OR RHINITIS. (Patient taking differently: Place 2 sprays into both nostrils daily as needed for allergies.) 48 mL 2    lisdexamfetamine (VYVANSE) 50 MG capsule TAKE 1 CAPSULE BY MOUTH EVERY DAY (Patient not taking: Reported on 12/07/2022) 30 capsule 0    lisdexamfetamine (VYVANSE) 50 MG capsule Take 1 capsule (50 mg total) by mouth daily. (Patient not taking: Reported on 12/07/2022) 30 capsule 0    Multiple Vitamin (MULTIVITAMIN) tablet Take 1 tablet by mouth every morning.      OLANZapine (ZYPREXA) 15 MG tablet Take 15 mg by mouth at  bedtime.      vitamin B-12 (CYANOCOBALAMIN) 1000 MCG tablet Take 1,000 mcg by mouth every morning.      vitamin C (ASCORBIC ACID) 500 MG tablet Take 500 mg by mouth daily.       Patient Stressors:    Patient Strengths:    Treatment Modalities: Medication Management, Group therapy, Case management,  1 to 1 session with clinician, Psychoeducation, Recreational therapy.   Physician Treatment Plan for Primary Diagnosis: Bipolar disorder, mixed ([place]) Long Term Goal(s): Improvement in symptoms so as ready for discharge   Short Term Goals: Ability to identify changes in lifestyle to reduce recurrence of condition will improve Ability to verbalize feelings will improve Ability to disclose and discuss suicidal ideas Ability to demonstrate self-control will improve Ability to identify and develop effective coping behaviors will improve Ability to maintain clinical measurements within normal limits will improve Compliance with prescribed medications will improve Ability to identify triggers associated with substance abuse/mental health issues will improve  Medication Management: Evaluate patient's response, side effects, and tolerance of medication regimen.  Therapeutic Interventions: 1 to 1 sessions, Unit Group sessions and Medication administration.  Evaluation of Outcomes: Not Met  Physician Treatment Plan for Secondary Diagnosis: Principal Problem:   Bipolar disorder, mixed ([place]) Active Problems:   Bipolar affect, depressed ([place])  Long Term Goal(s): Improvement in symptoms so as ready for discharge   Short Term Goals: Ability to identify changes in lifestyle to reduce recurrence of condition will improve Ability to verbalize feelings will improve Ability to disclose and discuss suicidal ideas Ability to demonstrate self-control will improve Ability to identify and develop effective coping behaviors will improve Ability to maintain clinical measurements within normal limits will  improve Compliance with prescribed medications will improve Ability to identify triggers associated with substance abuse/mental health issues will improve     Medication Management: Evaluate patient's response, side effects, and tolerance of medication regimen.  Therapeutic Interventions: 1 to 1 sessions, Unit Group sessions and Medication administration.  Evaluation of Outcomes: Not Met   RN Treatment Plan for Primary Diagnosis: Bipolar disorder, mixed ([place]) Long Term Goal(s): Knowledge of disease and therapeutic regimen to maintain health will improve  Short Term Goals: Ability to remain free from injury will improve, Ability to participate in decision making will improve, Ability to verbalize feelings will improve, Ability to disclose and discuss suicidal ideas, and Ability to identify and develop effective coping behaviors will improve  Medication Management: RN will administer medications as ordered by provider, will assess and evaluate  patient's response and provide education to patient for prescribed medication. RN will report any adverse and/or side effects to prescribing provider.  Therapeutic Interventions: 1 on 1 counseling sessions, Psychoeducation, Medication administration, Evaluate responses to treatment, Monitor vital signs and CBGs as ordered, Perform/monitor CIWA, COWS, AIMS and Fall Risk screenings as ordered, Perform wound care treatments as ordered.  Evaluation of Outcomes: Not Met   LCSW Treatment Plan for Primary Diagnosis: Bipolar disorder, mixed ([place]) Long Term Goal(s): Safe transition to appropriate next level of care at discharge, Engage patient in therapeutic group addressing interpersonal concerns.  Short Term Goals: Engage patient in aftercare planning with referrals and resources, Increase social support, Increase emotional regulation, Facilitate acceptance of mental health diagnosis and concerns, Identify triggers associated with mental health/substance abuse  issues, and Increase skills for wellness and recovery  Therapeutic Interventions: Assess for all discharge needs, 1 to 1 time with Social worker, Explore available resources and support systems, Assess for adequacy in community support network, Educate family and significant other(s) on suicide prevention, Complete Psychosocial Assessment, Interpersonal group therapy.  Evaluation of Outcomes: Not Met   Progress in Treatment: Attending groups: Yes. Participating in groups: Yes. Taking medication as prescribed: Yes. Toleration medication: Yes. Family/Significant other contact made: Yes, individual(s) contacted:  Mother and Father  Patient understands diagnosis: Yes. Discussing patient identified problems/goals with staff: Yes. Medical problems stabilized or resolved: Yes. Denies suicidal/homicidal ideation: Yes. Issues/concerns per patient self-inventory: No.   New problem(s) identified: No, Describe:  None   New Short Term/Long Term Goal(s): medication stabilization, elimination of SI thoughts, development of comprehensive mental wellness plan.   Patient Goals: "Coping skills to manage my depression and anxiety"  Discharge Plan or Barriers: Patient recently admitted. CSW will continue to follow and assess for appropriate referrals and possible discharge planning.   Reason for Continuation of Hospitalization: Anxiety Depression Medication stabilization Suicidal ideation  Estimated Length of Stay: 3 to 7 days   Last [place] Suicide Severity Risk Score: [place] Admission (Current) from 12/08/2022 in [place] [facility] ED from 12/06/2022 in [facility] Emergency Department at [place] Management from 03/02/2022 in [facility] at [place] Low Risk High Risk High Risk       Last [facility] 2/9 Scores:    04/20/2022   11:07 AM 03/02/2022   11:42 AM 02/27/2022   10:45 AM   Depression screen PHQ 2/9  Decreased Interest 3 3 3  $ Down, Depressed, Hopeless 3 3 3  $ PHQ - 2 Score 6 6 6  $ Altered sleeping 3 3 3  $ Tired, decreased energy 3 2 2  $ Change in appetite 3 0 0  Feeling bad or failure about yourself  3 3 3  $ Trouble concentrating 3 3 3  $ Moving slowly or fidgety/restless 3 3 3  $ Suicidal thoughts 1 0 3  PHQ-9 Score 25 20 23  $ Difficult doing work/chores Extremely dIfficult Extremely dIfficult Extremely dIfficult    Scribe for Treatment Team: Darleen Crocker, Latanya Presser 12/10/2022 9:24 AM

## 2022-12-10 NOTE — [facility] Group Notes (Signed)
Patient did not attend the AA group.

## 2022-12-10 NOTE — Progress Notes (Signed)
   12/09/22 2000  Psychosocial Assessment  Patient Complaints Anxiety;Depression (Anxiety is 1# and depression 7-8 #)  Eye Contact Fair  Facial Expression Anxious  Affect Anxious;Depressed  Speech Logical/coherent  Interaction Assertive  Motor Activity Other (Comment) (WNL)  Behavior Characteristics Cooperative  Mood Depressed;Anxious  Thought Process  Coherency WDL  Content WDL  Delusions None reported or observed  Perception WDL  Hallucination None reported or observed  Judgment Limited  Confusion None  Danger to Self  Current suicidal ideation? Denies  Self-Injurious Behavior No self-injurious ideation or behavior indicators observed or expressed   Danger to Others  Danger to Others None reported or observed

## 2022-12-10 NOTE — Group Note (Signed)
Recreation Therapy Group Note   Group Topic:Health and Wellness  Group Date: 12/10/2022 Start Time: 0945 End Time: 1000 Facilitators: Kadiatou Oplinger-McCall, LRT,CTRS Location: [address] Dayroom   Goal Area(s) Addresses:  Patient will define components of whole wellness. Patient will verbalize benefit of whole wellness.   Group Description: Exercise.  LRT and patients discussed the importance of wellness and exercise.  LRT then instructed patients they would lead the group in whatever stretches or exercises of their choosing.  Patients were to make the most of the activity and give themselves the opportunity to get the most the out of the activity.   Affect/Mood: Appropriate   Participation Level: Engaged   Participation Quality: Independent   Behavior: Appropriate   Speech/Thought Process: Focused   Insight: Good   Judgement: Good   Modes of Intervention: Music and Exercise   Patient Response to Interventions:  Engaged   Education Outcome:  Acknowledges education and In group clarification offered    Clinical Observations/Individualized Feedback: Pt arrived a little late to group.  However, once pt came, he was very engaged, bright and enthusiastic while in group session.     Plan: Continue to engage patient in RT group sessions 2-3x/week.   Dianara Smullen-McCall, LRT,CTRS  12/10/2022 12:08 PM

## 2022-12-11 DIAGNOSIS — F314 Bipolar disorder, current episode depressed, severe, without psychotic features: Secondary | ICD-10-CM | POA: Diagnosis not present

## 2022-12-11 LAB — URINALYSIS, COMPLETE (UACMP) WITH MICROSCOPIC
Bacteria, UA: NONE SEEN
Bilirubin Urine: NEGATIVE
Glucose, UA: NEGATIVE mg/dL
Hgb urine dipstick: NEGATIVE
Ketones, ur: NEGATIVE mg/dL
Leukocytes,Ua: NEGATIVE
Nitrite: NEGATIVE
Protein, ur: NEGATIVE mg/dL
Specific Gravity, Urine: 1.01 (ref 1.005–1.030)
pH: 7 (ref 5.0–8.0)

## 2022-12-11 MED ORDER — AMPHETAMINE-DEXTROAMPHET ER 5 MG PO CP24
15.0000 mg | ORAL_CAPSULE | Freq: Every day | ORAL | Status: AC
  Administered 2022-12-11: 15 mg via ORAL
  Filled 2022-12-11: qty 1

## 2022-12-11 MED ORDER — GABAPENTIN 100 MG PO CAPS
200.0000 mg | ORAL_CAPSULE | Freq: Three times a day (TID) | ORAL | Status: DC
  Administered 2022-12-11 – 2022-12-12 (×4): 200 mg via ORAL
  Filled 2022-12-11 (×11): qty 2

## 2022-12-11 MED ORDER — AMPHETAMINE-DEXTROAMPHET ER 5 MG PO CP24
5.0000 mg | ORAL_CAPSULE | Freq: Every day | ORAL | Status: AC
  Administered 2022-12-13: 5 mg via ORAL
  Filled 2022-12-11: qty 1

## 2022-12-11 MED ORDER — AMPHETAMINE-DEXTROAMPHET ER 10 MG PO CP24
10.0000 mg | ORAL_CAPSULE | Freq: Every day | ORAL | Status: AC
  Administered 2022-12-12: 10 mg via ORAL
  Filled 2022-12-11: qty 1

## 2022-12-11 NOTE — [facility] Suicide Risk Assessment (Signed)
[place] INPATIENT:  Family/Significant Other Suicide Prevention Education  Suicide Prevention Education:  Education Completed; [place] [phone] (Father) has been identified by the patient as the family member/significant other with whom the patient will be residing, and identified as the person(s) who will aid the patient in the event of a mental health crisis (suicidal ideations/suicide attempt).  With written consent from the patient, the family member/significant other has been provided the following suicide prevention education, prior to the and/or following the discharge of the patient.  The suicide prevention education provided includes the following: Suicide risk factors Suicide prevention and interventions National Suicide Hotline telephone number [facility] assessment telephone number [facility] Emergency Assistance [place] and/or Residential Mobile Crisis Unit telephone number  Request made of family/significant other to: Remove weapons (e.g., guns, rifles, knives), all items previously/currently identified as safety concern.   Remove drugs/medications (over-the-counter, prescriptions, illicit drugs), all items previously/currently identified as a safety concern.  The family member/significant other verbalizes understanding of the suicide prevention education information provided.  The family member/significant other agrees to remove the items of safety concern listed above.  CSW spoke with Mr. Garverick who states that his son will return to his own apartment after discharge.  He states that he and the Pt's mother call and visit with their son often throughout the week.  He states that the Pt's mother is also on the lease and has a key to the apartment.  He states that there are no firearms in the apartment but that here is 1 sword and that he will remove that item prior to his son's discharge.  Mr. Ruecker states that he is active in his son's outpatient  treatment and maintains contact with his providers.  He also states that he helps to manage his son's medications and states that all pills and pill bottles have been removed from the apartment.  CSW completed SPE with Mr. Ardito.   Darleen Crocker 12/11/2022, 9:40 AM

## 2022-12-11 NOTE — Group Note (Signed)
Recreation Therapy Group Note   Group Topic:Animal Assisted Therapy   Group Date: 12/11/2022 Start Time: 1430 End Time: 1500 Facilitators: Nettie Cromwell-McCall, LRT,CTRS Location: [address] Dayroom   Animal-Assisted Activity (AAA) Program Checklist/Progress Notes Patient Eligibility Criteria Checklist & Daily Group note for Rec Tx Intervention  AAA/T Program Assumption of Risk Form signed by Patient/ or Parent Legal Guardian Yes  Patient is free of allergies or severe asthma Yes  Patient reports no fear of animals Yes  Patient reports no history of cruelty to animals Yes  Patient understands his/her participation is voluntary Yes  Patient washes hands before animal contact Yes  Patient washes hands after animal contact Yes   Affect/Mood: Appropriate   Participation Level: Engaged   Participation Quality: Independent   Behavior: Appropriate    Clinical Observations/Individualized Feedback: Patient attended session and interacted appropriately with therapy dog and peers. Patient asked appropriate questions about therapy dog and his training. Patient shared stories about their pets at home with group.    Plan: Continue to engage patient in RT group sessions 2-3x/week.   Jesse Jones, LRT,CTRS 12/11/2022 3:35 PM

## 2022-12-11 NOTE — Group Note (Signed)
LCSW Group Therapy Note   Group Date: 12/11/2022 Start Time: 1100 End Time: 1200  Type of Therapy: Group Therapy: Boundaries  Participation Level: Active  Description of Group: This group will address the use of boundaries in their personal lives. Patients will explore why boundaries are important, the difference between healthy and unhealthy boundaries, and negative and positive outcomes of different boundaries and will look at how boundaries can be crossed.  Patients will be encouraged to identify current boundaries in their own lives and identify what kind of boundary is being set. Facilitators will guide patients in utilizing problem-solving interventions to address and correct types of boundaries being used and to address when no boundary is being used. Understanding and applying boundaries will be explored and addressed for obtaining and maintaining a balanced life. Patients will be encouraged to explore ways to assertively make their boundaries and needs known to significant others in their lives, using other group members and facilitator for role play, support, and feedback.   Therapeutic Goals: 1. Patient will identify areas in their life where setting clear boundaries could be used to improve their life.  2. Patient will identify signs/triggers that a boundary is not being respected. 3. Patient will identify two ways to set boundaries in order to achieve balance in their lives: 4. Patient will demonstrate ability to communicate their needs and set boundaries through discussion and/or role plays  Summary of Progress/Problems: The Pt attended group and remained there the entire time.  The Pt accepted all worksheets and materials provided.  The Pt was appropriate with peers and staff.  The Pt demonstrated understanding of the topic being discussed by asking questions and sharing with their peers.   Darleen Crocker, LCSWA 12/11/2022  1:18 PM

## 2022-12-11 NOTE — Progress Notes (Signed)
[facility] MD Progress Note  12/11/2022 1:40 PM [name]  MRN:  [id number] Principal Problem: Severe bipolar I disorder, current or most recent episode depressed ([place]) Diagnosis: Principal Problem:   Severe bipolar I disorder, current or most recent episode depressed ([place]) Active Problems:   ADHD (attention deficit hyperactivity disorder)   GAD (generalized anxiety disorder)   Benzodiazepine dependence ([place])   Insomnia  Reason for admission: Jesse Jones is a 42 yo Caucasian male with past mental health history of ADHD, alcohol use disorder in sustained remission & GAD who presented to the [place] on 02/08 after intentionally taking 12 mg of Xanax in a suicide attempt.  Patient is a poor historian, and initially stated he did not know why he overdosed, but then stated it was because his parents got into an argument over his medications.  Patient was IVC'd and transferred to this behavioral health Hospital for treatment and stabilization of his mental status.  24 hr chart review: BP WNL in last 24 hrs, HR with some periods of elevations, ranging from 106-113 earlier today morning. Patient has remained compliant with scheduled medications, and remains on the Ativan detox protocol for benzodiazepine use to be completed later today evening at 5 pm.  He is continuing to attend unit group sessions and participating.  He required hydroxyzine 25 mg last night for anxiety, and required Trazodone 50 mg for insomnia.  No behavioral concerns noted in the past 24 hours.  Patient assessment note, 2/13/024: Mood is less depressed, affect is congruent. Pt however, continues to rate depression as a 6, 10 being worst.  He reports that depression was a 7 (10 being worst), prior to admission.  He denies SI/HI/AVH.  He denies paranoia and there is no evidence of delusional thoughts.  Patient reports that he had a good sleep quality last night, he reports a good appetite, and denies any medication related  side effects.  During encounter with Probation officer, patient is restless, and observed to be shaking his legs restlessly as he talks to Probation officer.  He reports feeling anxious due to being taken off the Adderall yesterday.  He states that he has been taking Adderall since he was a teenager.  Patient on 30 mg of Adderall daily prior to admission, and medication ordered on admission.  Patient has been educated that it is important that his symptoms are observed without stimulant medications, and is agreeable to discontinuing Adderall, but wants to slowly taper the medication of.  Writer suggested giving patient Adderall 15 mg today (2/13), followed by 10 mg tomorrow (2/14), and 5 mg on 2/15 and then stopping the medication.  Patient is agreeable to this plan.    We are increasing gabapentin to 200 mg 3 times daily for management of anxiety.  We are continuing other medications as listed below.  Patient to be reevaluated regarding need for extension of Ativan detox protocol medication after his Ativan taper ends tonight.  Patient has verbalized that he would like to be on Klonopin once taper is completed and of his anxiety.  He has also expressed wanting to be on a nonstimulant medication for management of ADHD.  We will revisit this tomorrow.  Medication adjustments are continuing to be completed, and pt continues to be in need of continuous hospitalization for treatment and stabilization of mood and anxiety.  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   Depression    ED (erectile dysfunction) 05/12/2012   Hyperlipidemia, mild 04/18/2015   Insomnia    Nasal septal deviation  08/13/2015   Outbursts of anger    Preventative health care 10/03/2011   Tobacco abuse 04/28/2011    Past Surgical History:  Procedure Laterality Date   NASAL SEPTUM SURGERY Bilateral    Dr Redmond Baseman 2017   punctured lung     toe surgeries  during childhood    for ingrown toenails   Family History:  Family History  Problem Relation Age of Onset   Depression Mother    Anxiety disorder Mother    Depression Brother    Diabetes Brother        type 1   Cancer Paternal Grandmother        lung/ didn't smoke   Cancer Maternal Uncle 66       colon cancer   Family Psychiatric  History: See above Social History:  Social History   Substance and Sexual Activity  Alcohol Use Yes   Alcohol/week: 36.0 standard drinks of alcohol   Types: 36 Cans of beer per week   Comment: sober x73yr No alcohol.     Social History   Substance and Sexual Activity  Drug Use Yes   Types: Marijuana, Other-see comments   Comment: pt stopped 4 year ago    Social History   Socioeconomic History   Marital status: Single    Spouse name: Not on file   Number of children: 0   Years of education: 12   Highest education level: 12th grade  Occupational History   Not on file  Tobacco Use   Smoking status: Former    Packs/day: 0.30    Years: 20.00    Total pack years: 6.00    Types: Cigarettes    Passive exposure: Current   Smokeless tobacco: Never   Tobacco comments:    Discussed Smoking Cessation and Alcohol and Drug Services  Vaping Use   Vaping Use: Never used  Substance and Sexual Activity   Alcohol use: Yes    Alcohol/week: 36.0 standard drinks of alcohol    Types: 36 Cans of beer per week    Comment: sober x453yrNo alcohol.   Drug use: Yes    Types: Marijuana, Other-see comments    Comment: pt stopped 4 year ago   Sexual activity: Yes    Partners: Female    Comment: lives alone and eating well. exercising  Other Topics Concern   Not on file  Social History Narrative   Not on file    Social Determinants of Health   Financial Resource Strain: Low Risk  (03/02/2022)   Overall Financial Resource Strain (CARDIA)    Difficulty of Paying Living Expenses: Not hard at all  Food Insecurity: No Food Insecurity (12/08/2022)   Hunger Vital Sign    Worried About Running Out of Food in the Last Year: Never true    Ran Out of Food in the Last Year: Never true  Transportation Needs: No Transportation Needs (12/08/2022)   PRAPARE - TrHydrologistMedical): No    Lack of Transportation (Non-Medical): No  Physical Activity: Inactive (03/02/2022)   Exercise Vital  Sign    Days of Exercise per Week: 0 days    Minutes of Exercise per Session: 0 min  Stress: Stress Concern Present (03/02/2022)   [place]    Feeling of Stress : Very much  Social Connections: Socially Isolated (03/02/2022)   Social Connection and Isolation Panel [NHANES]    Frequency of Communication with Friends and Family: More than three times a week    Frequency of Social Gatherings with Friends and Family: More than three times a week    Attends Religious Services: Never    Marine scientist or Organizations: No    Attends Music therapist: Never    Marital Status: Never married   sleep: Good  Appetite:  Poor  Current Medications: Current Facility-Administered Medications  Medication Dose Route Frequency Provider Last Rate Last Admin   acetaminophen (TYLENOL) tablet 650 mg  650 mg Oral Q6H PRN Motley-Mangrum, Jadeka A, PMHNP   650 mg at 12/09/22 0624   alum & mag hydroxide-simeth (MAALOX/MYLANTA) 200-200-20 MG/5ML suspension 30 mL  30 mL Oral Q4H PRN Motley-Mangrum, Al Pimple, PMHNP       [START ON 12/12/2022] amphetamine-dextroamphetamine (ADDERALL XR) 24 hr capsule 10 mg  10 mg Oral Daily Nicholes Rough, NP       [START ON 12/13/2022] amphetamine-dextroamphetamine (ADDERALL XR) 24 hr capsule 5 mg  5 mg Oral  Daily Armen Waring, NP       escitalopram (LEXAPRO) tablet 20 mg  20 mg Oral Daily Minda Ditto, RPH   20 mg at 12/11/22 0753   feeding supplement (ENSURE ENLIVE / ENSURE PLUS) liquid 237 mL  237 mL Oral TID BM Takerra Lupinacci, NP   237 mL at 12/11/22 0953   fluticasone (FLONASE) 50 MCG/ACT nasal spray 2 spray  2 spray Each Nare Daily PRN Motley-Mangrum, Jadeka A, PMHNP       gabapentin (NEURONTIN) capsule 200 mg  200 mg Oral TID Nicholes Rough, NP       hydrOXYzine (ATARAX) tablet 25 mg  25 mg Oral Q6H PRN Leevy-Johnson, Brooke A, NP   25 mg at 12/10/22 2125   loperamide (IMODIUM) capsule 2-4 mg  2-4 mg Oral PRN Leevy-Johnson, Brooke A, NP       LORazepam (ATIVAN) tablet 0.5 mg  0.5 mg Oral BID Winfred Leeds, Nadir, MD       magnesium hydroxide (MILK OF MAGNESIA) suspension 30 mL  30 mL Oral Daily PRN Motley-Mangrum, Jadeka A, PMHNP       multivitamin with minerals tablet 1 tablet  1 tablet Oral q morning Motley-Mangrum, Jadeka A, PMHNP   1 tablet at 12/11/22 0953   OLANZapine (ZYPREXA) tablet 15 mg  15 mg Oral QHS Motley-Mangrum, Jadeka A, PMHNP   15 mg at 12/10/22 2125   OLANZapine zydis (ZYPREXA) disintegrating tablet 5 mg  5 mg Oral Q8H PRN Motley-Mangrum, Jadeka A, PMHNP       And   ziprasidone (GEODON) injection 20 mg  20 mg Intramuscular PRN Motley-Mangrum, Jadeka A, PMHNP       omega-3 acid ethyl esters (LOVAZA) capsule 2 g  2 g Oral Daily Motley-Mangrum, Jadeka A, PMHNP   2 g at 12/11/22 0753   ondansetron (ZOFRAN-ODT) disintegrating tablet 4 mg  4 mg Oral Q6H PRN Leevy-Johnson, Brooke A, NP       thiamine (Vitamin B-1) tablet 100 mg  100 mg Oral Daily Leevy-Johnson, Brooke A, NP   100 mg at 12/11/22 [phone]  traZODone (DESYREL) tablet 50 mg  50 mg Oral QHS PRN Motley-Mangrum, Jadeka A, PMHNP   50 mg at 12/10/22 2125    Lab Results:  No results found for this or any previous visit (from the past 48 hour(s)).   Blood Alcohol level:  Lab Results  Component Value Date   ETH <11 02/10/2013    ETH (H) 12/25/2008    181        LOWEST DETECTABLE LIMIT FOR SERUM ALCOHOL IS 5 mg/dL FOR MEDICAL PURPOSES ONLY    Metabolic Disorder Labs: Lab Results  Component Value Date   HGBA1C 4.6 (L) 12/09/2022   MPG 85.32 12/09/2022   MPG 97 06/23/2020   No results found for: "PROLACTIN" Lab Results  Component Value Date   CHOL 191 12/09/2022   TRIG 42 12/09/2022   HDL 60 12/09/2022   CHOLHDL 3.2 12/09/2022   VLDL 8 12/09/2022   LDLCALC 123 (H) 12/09/2022   LDLCALC 109 (H) 01/31/2022    Physical Findings: AIMS:  0 CIWA:  CIWA-Ar Total: 2 COWS:     Musculoskeletal: Strength & Muscle Tone: within normal limits Gait & Station: normal Patient leans: N/A  Psychiatric Specialty Exam:  Presentation  General Appearance:  Appropriate for Environment; Fairly Groomed  Eye Contact: Good  Speech: Clear and Coherent  Speech Volume: Normal  Handedness: Right   Mood and Affect  Mood: Depressed; Anxious  Affect: Congruent   Thought Process  Thought Processes: Coherent  Descriptions of Associations:Intact  Orientation:Full (Time, Place and Person)  Thought Content:Logical  History of Schizophrenia/Schizoaffective disorder:No  Duration of Psychotic Symptoms:No data recorded Hallucinations:Hallucinations: None  Ideas of Reference:None  Suicidal Thoughts:Suicidal Thoughts: No  Homicidal Thoughts:Homicidal Thoughts: No   Sensorium  Memory: Immediate Good  Judgment: Fair  Insight: Fair   Community education officer  Concentration: Good  Attention Span: Good  Recall: Good  Fund of Knowledge: Good  Language: Good   Psychomotor Activity  Psychomotor Activity: Psychomotor Activity: Normal   Assets  Assets: Communication Skills   Sleep  Sleep: Sleep: Good    Physical Exam: Physical Exam Constitutional:      Appearance: Normal appearance.  Eyes:     Pupils: Pupils are equal, round, and reactive to light.  Musculoskeletal:         General: Normal range of motion.     Cervical back: Normal range of motion.  Neurological:     Mental Status: He is oriented to person, place, and time.    Review of Systems  Constitutional: Negative.   HENT: Negative.    Eyes: Negative.   Respiratory: Negative.    Cardiovascular: Negative.   Gastrointestinal: Negative.   Genitourinary: Negative.   Musculoskeletal: Negative.   Skin: Negative.   Neurological: Negative.  Negative for dizziness.  Psychiatric/Behavioral:  Positive for depression and substance abuse. Negative for hallucinations, memory loss and suicidal ideas. The patient is nervous/anxious and has insomnia.    Blood pressure 126/89, pulse 97, temperature 97.9 F (36.6 C), temperature source Oral, resp. rate 16, height 6' 2"$  (1.88 m), weight 86.2 kg, SpO2 100 %. Body mass index is 24.39 kg/m.  Treatment Plan Summary: Daily contact with patient to assess and evaluate symptoms and progress in treatment and Medication management  Observation Level/Precautions:  15 minute checks  Laboratory:  Labs reviewed: Qtc on EKG slightly prolonged at 464. Repeat EKG ordered forl trending while on antipsychotics.   Psychotherapy:  Unit Group sessions  Medications:  See [facility]  Consultations:  To be determined  Discharge Concerns:  Safety, medication compliance, mood stability  Estimated LOS: 5-7 days  Other:  N/A   Labs independently reviewed on 12/11/2022. Qtc on EKG slightly prolonged at 464. Repeat EKG ordered forl trending while on antipsychotics. Ordered baseline UA.   PLAN Safety and Monitoring: Voluntary admission to inpatient psychiatric unit for safety, stabilization and treatment Daily contact with patient to assess and evaluate symptoms and progress in treatment Patient's case to be discussed in multi-disciplinary team meeting Observation Level : q15 minute checks Vital signs: q12 hours Precautions: Safety  Long Term Goal(s): Improvement in symptoms so as ready  for discharge  Short Term Goals: Ability to identify changes in lifestyle to reduce recurrence of condition will improve, Ability to disclose and discuss suicidal ideas, Ability to demonstrate self-control will improve, Ability to identify and develop effective coping behaviors will improve, Ability to maintain clinical measurements within normal limits will improve, Compliance with prescribed medications will improve, and Ability to identify triggers associated with substance abuse/mental health issues will improve  Diagnoses Principal Problem:   Severe bipolar I disorder, current or most recent episode depressed ([place]) Active Problems:   ADHD (attention deficit hyperactivity disorder)   GAD (generalized anxiety disorder)   Benzodiazepine dependence ([place])   Insomnia  Medications -Taper Adderall 30 mg off as pt has withdrawal symptoms r/t abrupt cessation-Give 15 mg on 2/13 followed by 10 mg on 2/14, then 5 mg on 2/15, then stop.  -Increase Gabapentin to 200 mg TID for anxiety  -Continue Lexapro 20 mg for depressive symptoms -Continue Zyprexa 15 mg nightly for mood stabilization -Continue Ativan detox protocol-please see MAR for complete order -Continue Ensures TID for nutritional support -Discontinued BuSpar PRN on 2/12 -Continue hydroxyzine 25 mg as needed 3 times daily for anxiety -Continue trazodone 50 mg p.o. Ureh nightly for sleep -Continue Lovaza 2 g daily for hyperlipidemia Patient educated on rationales, benefits and possible side effects of all medications listed above, and agreeable with trials, and plan.  Other PRNS -Continue Tylenol 650 mg every 6 hours PRN for mild pain -Continue Maalox 30 mg every 4 hrs PRN for indigestion -Continue Imodium 2-4 mg as needed for diarrhea -Continue Milk of Magnesia as needed every 6 hrs for constipation -Continue Zofran disintegrating tabs every 6 hrs PRN for nausea   Discharge Planning: Social work and case management to assist with  discharge planning and identification of hospital follow-up needs prior to discharge Estimated LOS: 5-7 days Discharge Concerns: Need to establish a safety plan; Medication compliance and effectiveness Discharge Goals: Return home with outpatient referrals for mental health follow-up including medication management/psychotherapy  I certify that inpatient services furnished can reasonably be expected to improve the patient's condition.    Nicholes Rough, NP 12/11/2022, 1:40 PMPatient ID: [name], male   DOB: 08/11/1981, 42 y.o.   MRN: [id number]

## 2022-12-11 NOTE — BHH Group Notes (Signed)
Adult Psychoeducational Group Note  Date:  12/11/2022 Time:  9:32 PM  Group Topic/Focus:  Wrap-Up Group:   The focus of this group is to help patients review their daily goal of treatment and discuss progress on daily workbooks.  Participation Level:  Active  Participation Quality:  Appropriate and Attentive  Affect:  Appropriate  Cognitive:  Alert and Appropriate  Insight: Appropriate and Good  Engagement in Group:  Engaged  Modes of Intervention:  Discussion and Education  Additional Comments:  pt attended and participated in wrap up group this evening and rated their day an 8/10, due to their participation in dog therapy. Pt completed their goal which was to listen and not talk as much.   Cristi Loron 12/11/2022, 9:32 PM

## 2022-12-11 NOTE — Progress Notes (Signed)
D: Patient alert and oriented. Affect/mood reported as improving. Denies SI, HI, AVH, and pain.   A: Scheduled medication administered to patient, per MD orders. Support and encouragement provided. Routine safety checks conducted every 15 minutes. Patient informed to notify staff with problems or concerns.   R: No adverse drug reactions noted. Patient contracts for safety at this time. Patient compliant with medications and treatment plan. Patient receptive, calm and cooperative. Patient interacts well with others on unit. Patient remains safe at this time. 

## 2022-12-11 NOTE — Plan of Care (Signed)
  Problem: Education: Goal: Knowledge of [place] [facility] Education information/materials will improve Outcome: Progressing Goal: Emotional status will improve Outcome: Progressing Goal: Mental status will improve Outcome: Progressing Goal: Verbalization of understanding the information provided will improve Outcome: Progressing   Problem: Activity: Goal: Interest or engagement in activities will improve Outcome: Progressing Goal: Sleeping patterns will improve Outcome: Progressing   Problem: Coping: Goal: Ability to verbalize frustrations and anger appropriately will improve Outcome: Progressing Goal: Ability to demonstrate self-control will improve Outcome: Progressing   Problem: Health Behavior/Discharge Planning: Goal: Identification of resources available to assist in meeting health care needs will improve Outcome: Progressing Goal: Compliance with treatment plan for underlying cause of condition will improve Outcome: Progressing   Problem: Physical Regulation: Goal: Ability to maintain clinical measurements within normal limits will improve Outcome: Progressing

## 2022-12-12 DIAGNOSIS — F314 Bipolar disorder, current episode depressed, severe, without psychotic features: Secondary | ICD-10-CM | POA: Diagnosis not present

## 2022-12-12 MED ORDER — GABAPENTIN 300 MG PO CAPS
300.0000 mg | ORAL_CAPSULE | Freq: Three times a day (TID) | ORAL | Status: DC
  Administered 2022-12-12 – 2022-12-16 (×11): 300 mg via ORAL
  Filled 2022-12-12 (×17): qty 1

## 2022-12-12 MED ORDER — ATOMOXETINE HCL 10 MG PO CAPS
10.0000 mg | ORAL_CAPSULE | Freq: Every day | ORAL | Status: DC
  Administered 2022-12-12 – 2022-12-14 (×3): 10 mg via ORAL
  Filled 2022-12-12 (×6): qty 1

## 2022-12-12 NOTE — Group Note (Signed)
Recreation Therapy Group Note   Group Topic:Other  Group Date: 12/12/2022 Start Time: 0930 End Time: 1000 Facilitators: Vitaly Wanat-McCall, LRT,CTRS Location: [address] Dayroom   Goal Area(s) Addresses: Patient will actively participate in group activity. Patient will display ability to work with peers in activity.   Group Description: Valentine's Trivia.  LRT asked patients questions dealing with romantic movies and Valentine's in general in celebration of the holiday.  Patients had the choice of working individually or in small groups to come up with answers to the questions.    Affect/Mood: N/A   Participation Level: Did not attend    Clinical Observations/Individualized Feedback:     Plan: Continue to engage patient in RT group sessions 2-3x/week.   Minta Fair-McCall, LRT,CTRS 12/12/2022 1:02 PM

## 2022-12-12 NOTE — Progress Notes (Signed)
   12/11/22 2150  Psych Admission Type (Psych Patients Only)  Admission Status Involuntary  Psychosocial Assessment  Patient Complaints Anxiety;Depression  Eye Contact Fair  Facial Expression Animated  Affect Appropriate to circumstance  Speech Logical/coherent  Interaction Assertive  Motor Activity Slow  Appearance/Hygiene Unremarkable  Behavior Characteristics Cooperative  Mood Depressed  Thought Process  Coherency WDL  Content WDL  Delusions None reported or observed  Perception WDL  Hallucination None reported or observed  Judgment Limited  Confusion None  Danger to Self  Current suicidal ideation? Denies  Danger to Others  Danger to Others None reported or observed   Patient alert and oriented. Presenting appropriate to circumstance with a depressed mood. Patient denies SI, HI, AVH, and pain. Patient endorses anxiety 3/10 and depression 7/10. Scheduled medications administered to patient, per provider orders. Patient CIWA= 3 with mild visual sensitivity; patient reporting "blurring/blending of peoples faces on eyes trying to focus." Support and encouragement provided.  Routine safety checks conducted every 15 minutes. Patient verbally contracts for safety and remains safe on the unit.

## 2022-12-12 NOTE — Group Note (Unsigned)
Date:  12/12/2022 Time:  10:01 AM  Group Topic/Focus:  Orientation:   The focus of this group is to educate the patient on the purpose and policies of crisis stabilization and provide a format to answer questions about their admission.  The group details unit policies and expectations of patients while admitted.     Participation Level:  {BHH PARTICIPATION [id number]  Participation Quality:  {BHH PARTICIPATION QUALITY:22265}  Affect:  {BHH AFFECT:22266}  Cognitive:  {BHH COGNITIVE:22267}  Insight: {BHH Insight2:20797}  Engagement in Group:  {BHH ENGAGEMENT IN BP:[id number]  Modes of Intervention:  {BHH MODES OF INTERVENTION:22269}  Additional Comments:  ***  Garvin Fila 12/12/2022, 10:01 AM

## 2022-12-12 NOTE — Group Note (Signed)
Date:  12/12/2022 Time:  10:12 AM  Group Topic/Focus:  Orientation:   The focus of this group is to educate the patient on the purpose and policies of crisis stabilization and provide a format to answer questions about their admission.  The group details unit policies and expectations of patients while admitted.    Participation Level:  Active  Participation Quality:  Appropriate  Affect:  Appropriate  Cognitive:  Appropriate  Insight: Appropriate  Engagement in Group:  Engaged  Modes of Intervention:  Discussion  Additional Comments:    Garvin Fila 12/12/2022, 10:12 AM

## 2022-12-12 NOTE — Progress Notes (Signed)
[facility] MD Progress Note  12/12/2022 2:36 PM Jesse Jones  MRN:  [id number] Principal Problem: Severe bipolar I disorder, current or most recent episode depressed ([place]) Diagnosis: Principal Problem:   Severe bipolar I disorder, current or most recent episode depressed ([place]) Active Problems:   ADHD (attention deficit hyperactivity disorder)   GAD (generalized anxiety disorder)   Benzodiazepine dependence ([place])   Insomnia  Reason for admission: Jesse Jones is a 42 yo Caucasian male with past mental health history of ADHD, alcohol use disorder in sustained remission & GAD who presented to the [place] on 02/08 after intentionally taking 12 mg of Xanax in a suicide attempt.  Patient is a poor historian, and initially stated he did not know why he overdosed, but then stated it was because his parents got into an argument over his medications.  Patient was IVC'd and transferred to this behavioral health Hospital for treatment and stabilization of his mental status.  24 hr chart review: BP WNL in last 24 hrs, HR slightly elevated today morning at 110. Patient remains compliant with scheduled medications.  He is visible on the unit and is continuing to attend unit group sessions.  No behavioral episodes noted in the past 24 hours. PRN medications in the last 24 hours are trazodone 50 mg last night for sleep and Tylenol 650 mg today morning for pain.  Patient assessment note, 2/14/024: Mood is continuing to improve on a daily basis, patient verbalizes an improvement in his depressive symptoms.  Improvements in both depressive symptoms and anxiety noted as per both subjective and objective assessments of patient.  He denies SI, denies HI, denies AVH, denies paranoia, and there is no evidence of delusional thinking.  Patient reports a good sleep quality last night, reports a good appetite, denies being in any physical pain at the time of assessment.  He denies any medication related side  effects.  During encounter with patient, he persistently asked for Klonopin use.  Patient educated that there was no indication for Klonopin at this time, and that he is being given gabapentin which has the same effect of reducing anxiety, but is not a benzodiazepine medication which can lead to dependence.  We will increase gabapentin to 300 mg 3 times daily for management of anxiety since patient is continuing to report feeling anxious.  During this assessment patient sat calmly with no overt signs of anxiety, but reported feeling "anxious on the inside".  We are continuing to taper off Adderall, and will transition patient to Strattera which is a nonstimulant medication for management of his attention deficit disorder.  He is continuing to require hospitalization at this time since medication adjustments are continuing to be made.  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   Outbursts of anger    Preventative health care 10/03/2011   Tobacco abuse 04/28/2011    Past Surgical History:  Procedure Laterality Date   NASAL SEPTUM SURGERY Bilateral    Dr  Redmond Baseman 2017   punctured lung     toe surgeries  during childhood    for ingrown toenails   Family History:  Family History  Problem Relation Age of Onset   Depression Mother    Anxiety disorder Mother    Depression Brother    Diabetes Brother        type 1   Cancer Paternal Grandmother        lung/ didn't smoke   Cancer Maternal Uncle 55       colon cancer    Family Psychiatric  History: See above Social History:  Social History   Substance and Sexual Activity  Alcohol Use Yes   Alcohol/week: 36.0 standard drinks of alcohol   Types: 36 Cans of beer per week   Comment: sober x74yr No alcohol.     Social History   Substance and Sexual Activity  Drug Use Yes   Types: Marijuana, Other-see comments   Comment: pt stopped 4 year ago    Social History   Socioeconomic History   Marital status: Single    Spouse name: Not on file   Number of children: 0   Years of education: 12   Highest education level: 12th grade  Occupational History   Not on file  Tobacco Use   Smoking status: Former    Packs/day: 0.30    Years: 20.00    Total pack years: 6.00    Types: Cigarettes    Passive exposure: Current   Smokeless tobacco: Never   Tobacco comments:    Discussed Smoking Cessation and Alcohol and Drug Services  Vaping Use   Vaping Use: Never used  Substance and Sexual Activity   Alcohol use: Yes    Alcohol/week: 36.0 standard drinks of alcohol    Types: 36 Cans of beer per week    Comment: sober [id number] alcohol.   Drug use: Yes    Types: Marijuana, Other-see comments    Comment: pt stopped 4 year ago   Sexual activity: Yes    Partners: Female    Comment: lives alone and eating well. exercising  Other Topics Concern   Not on file  Social History Narrative   Not on file   Social Determinants of Health   Financial Resource Strain: Low Risk  (03/02/2022)   Overall Financial Resource Strain (CARDIA)    Difficulty of Paying Living Expenses: Not hard at all  Food Insecurity: No Food Insecurity (12/08/2022)   Hunger Vital Sign    Worried About Running Out of Food in the Last Year: Never true    Ran Out of Food in the Last Year: Never true  Transportation Needs: No Transportation Needs (12/08/2022)   PRAPARE - TrHydrologistMedical): No    Lack of Transportation (Non-Medical): No  Physical Activity:  Inactive (03/02/2022)   Exercise Vital Sign    Days of Exercise per Week: 0 days    Minutes of Exercise per Session: 0 min  Stress: Stress Concern Present (03/02/2022)   [place]  Feeling of Stress : Very much  Social Connections: Socially Isolated (03/02/2022)   Social Connection and Isolation Panel [NHANES]    Frequency of Communication with Friends and Family: More than three times a week    Frequency of Social Gatherings with Friends and Family: More than three times a week    Attends Religious Services: Never    AcRetail buyerf [employer]  or Organizations: No    Attends Archivist Meetings: Never    Marital Status: Never married   sleep: Good  Appetite:  Poor  Current Medications: Current Facility-Administered Medications  Medication Dose Route Frequency Provider Last Rate Last Admin   acetaminophen (TYLENOL) tablet 650 mg  650 mg Oral Q6H PRN Motley-Mangrum, Jadeka A, PMHNP   650 mg at 12/12/22 0628   alum & mag hydroxide-simeth (MAALOX/MYLANTA) 200-200-20 MG/5ML suspension 30 mL  30 mL Oral Q4H PRN Motley-Mangrum, Al Pimple, PMHNP       [START ON 12/13/2022] amphetamine-dextroamphetamine (ADDERALL XR) 24 hr capsule 5 mg  5 mg Oral Daily Lynn Recendiz, NP       atomoxetine (STRATTERA) capsule 10 mg  10 mg Oral Daily Seraiah Nowack, NP       escitalopram (LEXAPRO) tablet 20 mg  20 mg Oral Daily Minda Ditto, RPH   20 mg at 12/12/22 1107   feeding supplement (ENSURE ENLIVE / ENSURE PLUS) liquid 237 mL  237 mL Oral TID BM Noha Milberger, NP   237 mL at 12/12/22 0929   fluticasone (FLONASE) 50 MCG/ACT nasal spray 2 spray  2 spray Each Nare Daily PRN Motley-Mangrum, Jadeka A, PMHNP       gabapentin (NEURONTIN) capsule 300 mg  300 mg Oral TID Severina Sykora, NP       magnesium hydroxide (MILK OF MAGNESIA) suspension 30 mL  30 mL Oral Daily PRN Motley-Mangrum, Jadeka A, PMHNP       multivitamin with minerals tablet  1 tablet  1 tablet Oral q morning Motley-Mangrum, Jadeka A, PMHNP   1 tablet at 12/12/22 0927   OLANZapine (ZYPREXA) tablet 15 mg  15 mg Oral QHS Motley-Mangrum, Jadeka A, PMHNP   15 mg at 12/11/22 2158   OLANZapine zydis (ZYPREXA) disintegrating tablet 5 mg  5 mg Oral Q8H PRN Motley-Mangrum, Jadeka A, PMHNP       And   ziprasidone (GEODON) injection 20 mg  20 mg Intramuscular PRN Motley-Mangrum, Jadeka A, PMHNP       omega-3 acid ethyl esters (LOVAZA) capsule 2 g  2 g Oral Daily Motley-Mangrum, Jadeka A, PMHNP   2 g at 12/12/22 [id number]   thiamine (Vitamin B-1) tablet 100 mg  100 mg Oral Daily Leevy-Johnson, Brooke A, NP   100 mg at 12/12/22 [id number]   traZODone (DESYREL) tablet 50 mg  50 mg Oral QHS PRN Motley-Mangrum, Al Pimple, PMHNP   50 mg at 12/11/22 2159    Lab Results:  Results for orders placed or performed during the hospital encounter of 12/08/22 (from the past 48 hour(s))  Urinalysis, Complete w Microscopic -Urine, Clean Catch     Status: None   Collection Time: 12/11/22  6:35 PM  Result Value Ref Range   Color, Urine YELLOW YELLOW   APPearance CLEAR CLEAR   Specific Gravity, Urine 1.010 1.005 - 1.030   pH 7.0 5.0 - 8.0   Glucose, UA NEGATIVE NEGATIVE mg/dL   Hgb urine dipstick NEGATIVE NEGATIVE   Bilirubin Urine NEGATIVE NEGATIVE   Ketones, ur NEGATIVE NEGATIVE mg/dL   Protein, ur NEGATIVE NEGATIVE mg/dL   Nitrite NEGATIVE NEGATIVE   Leukocytes,Ua NEGATIVE NEGATIVE   RBC / HPF 0-5 0 - 5 RBC/hpf   WBC, UA 0-5 0 - 5 WBC/hpf   Bacteria, UA NONE SEEN NONE SEEN   Squamous Epithelial / HPF 0-5 0 - 5 /HPF    Comment: Performed at [facility] [place] [address] [place], [place]  [id number]     Blood Alcohol level:  Lab Results  Component Value Date   ETH <11 02/10/2013   ETH (H) 12/25/2008    181        LOWEST DETECTABLE LIMIT FOR SERUM ALCOHOL IS 5 mg/dL FOR MEDICAL PURPOSES ONLY    Metabolic Disorder Labs: Lab Results  Component Value Date   HGBA1C 4.6  (L) 12/09/2022   MPG 85.32 12/09/2022   MPG 97 06/23/2020   No results found for: "PROLACTIN" Lab Results  Component Value Date   CHOL 191 12/09/2022   TRIG 42 12/09/2022   HDL 60 12/09/2022   CHOLHDL 3.2 12/09/2022   VLDL 8 12/09/2022   LDLCALC 123 (H) 12/09/2022   LDLCALC 109 (H) 01/31/2022    Physical Findings: AIMS:  0 CIWA:  CIWA-Ar Total: 0 COWS:     Musculoskeletal: Strength & Muscle Tone: within normal limits Gait & Station: normal Patient leans: N/A  Psychiatric Specialty Exam:  Presentation  General Appearance:  Appropriate for Environment; Fairly Groomed  Eye Contact: Good  Speech: Clear and Coherent  Speech Volume: Normal  Handedness: Right   Mood and Affect  Mood: Depressed; Anxious  Affect: Congruent   Thought Process  Thought Processes: Coherent  Descriptions of Associations:Intact  Orientation:Full (Time, Place and Person)  Thought Content:Logical  History of Schizophrenia/Schizoaffective disorder:No  Duration of Psychotic Symptoms:No data recorded Hallucinations:Hallucinations: None  Ideas of Reference:None  Suicidal Thoughts:Suicidal Thoughts: No  Homicidal Thoughts:Homicidal Thoughts: No   Sensorium  Memory: Immediate Good  Judgment: Fair  Insight: Fair   Community education officer  Concentration: Fair  Attention Span: Fair  Recall: [place] of Knowledge: Fair  Language: Fair   Psychomotor Activity  Psychomotor Activity: Psychomotor Activity: Normal   Assets  Assets: Communication Skills   Sleep  Sleep: Sleep: Good    Physical Exam: Physical Exam Constitutional:      Appearance: Normal appearance.  Eyes:     Pupils: Pupils are equal, round, and reactive to light.  Musculoskeletal:        General: Normal range of motion.     Cervical back: Normal range of motion.  Neurological:     Mental Status: He is oriented to person, place, and time.    Review of Systems   Constitutional: Negative.   HENT: Negative.    Eyes: Negative.   Respiratory: Negative.    Cardiovascular: Negative.   Gastrointestinal: Negative.   Genitourinary: Negative.   Musculoskeletal: Negative.   Skin: Negative.   Neurological: Negative.  Negative for dizziness.  Psychiatric/Behavioral:  Positive for depression and substance abuse. Negative for hallucinations, memory loss and suicidal ideas. The patient is nervous/anxious and has insomnia.    Blood pressure 118/88, pulse (!) 110, temperature 97.8 F (36.6 C), temperature source Oral, resp. rate 18, height 6' 2"$  (1.88 m), weight 86.2 kg, SpO2 100 %. Body mass index is 24.39 kg/m.  Treatment Plan Summary: Daily contact with patient to assess and evaluate symptoms and progress in treatment and Medication management  Observation Level/Precautions:  15 minute checks  Laboratory:  Labs reviewed: Qtc on EKG slightly prolonged at 464. Repeat EKG ordered forl trending while on antipsychotics.   Psychotherapy:  Unit Group sessions  Medications:  See [facility]  Consultations:  To be determined   Discharge Concerns:  Safety, medication compliance, mood stability  Estimated LOS: 5-7 days  Other:  N/A   Labs independently reviewed on 12/12/2022. Qtc on EKG slightly prolonged at 464. Repeat EKG ordered for trending while  on antipsychotics with QTC 403. Ordered baseline UA which is WNL.   PLAN Safety and Monitoring: Voluntary admission to inpatient psychiatric unit for safety, stabilization and treatment Daily contact with patient to assess and evaluate symptoms and progress in treatment Patient's case to be discussed in multi-disciplinary team meeting Observation Level : q15 minute checks Vital signs: q12 hours Precautions: Safety  Long Term Goal(s): Improvement in symptoms so as ready for discharge  Short Term Goals: Ability to identify changes in lifestyle to reduce recurrence of condition will improve, Ability to disclose and discuss  suicidal ideas, Ability to demonstrate self-control will improve, Ability to identify and develop effective coping behaviors will improve, Ability to maintain clinical measurements within normal limits will improve, Compliance with prescribed medications will improve, and Ability to identify triggers associated with substance abuse/mental health issues will improve  Diagnoses Principal Problem:   Severe bipolar I disorder, current or most recent episode depressed ([place]) Active Problems:   ADHD (attention deficit hyperactivity disorder)   GAD (generalized anxiety disorder)   Benzodiazepine dependence ([place])   Insomnia  Medications -Tapering Adderall 30 mg off as pt had withdrawal symptoms r/t abrupt cessation-Give 15 mg on 2/13 followed by 10 mg on 2/14, then 5 mg on 2/15, then stop.  -Increase Gabapentin to 300 mg TID for anxiety  -Start Strattera 10 mg daily for ADD  -Continue Lexapro 20 mg for depressive symptoms -Continue Zyprexa 15 mg nightly for mood stabilization -Continue Ativan detox protocol-please see MAR for complete order -Continue Ensures TID for nutritional support -Discontinued BuSpar PRN on 2/12 -Continue hydroxyzine 25 mg as needed 3 times daily for anxiety -Continue trazodone 50 mg p.o. Ureh nightly for sleep -Continue Lovaza 2 g daily for hyperlipidemia Patient educated on rationales, benefits and possible side effects of all medications listed above, and agreeable with trials, and plan.  Other PRNS -Continue Tylenol 650 mg every 6 hours PRN for mild pain -Continue Maalox 30 mg every 4 hrs PRN for indigestion -Continue Imodium 2-4 mg as needed for diarrhea -Continue Milk of Magnesia as needed every 6 hrs for constipation -Continue Zofran disintegrating tabs every 6 hrs PRN for nausea   Discharge Planning: Social work and case management to assist with discharge planning and identification of hospital follow-up needs prior to discharge Estimated LOS: 5-7  days Discharge Concerns: Need to establish a safety plan; Medication compliance and effectiveness Discharge Goals: Return home with outpatient referrals for mental health follow-up including medication management/psychotherapy  I certify that inpatient services furnished can reasonably be expected to improve the patient's condition.    Nicholes Rough, NP 12/12/2022, 2:36 PMPatient ID: Jesse Jones, male   DOB: 1981-06-15, 42 y.o.   MRN: [id number] Patient ID: Jesse Jones, male   DOB: 10-Sep-1981, 42 y.o.   MRN: [id number]

## 2022-12-12 NOTE — Progress Notes (Signed)
   12/12/22 2000  Psych Admission Type (Psych Patients Only)  Admission Status Involuntary  Psychosocial Assessment  Patient Complaints Anxiety;Depression  Eye Contact Fair  Facial Expression Anxious  Affect Appropriate to circumstance  Speech Logical/coherent  Interaction Assertive  Motor Activity Slow  Appearance/Hygiene Unremarkable  Behavior Characteristics Cooperative  Mood Anxious  Thought Process  Coherency WDL  Content WDL  Delusions None reported or observed  Perception WDL  Hallucination None reported or observed  Judgment Impaired  Confusion None  Danger to Self  Current suicidal ideation? Denies  Danger to Others  Danger to Others None reported or observed   Alert/oriented. Makes needs/concerns known to staff. Pleasant cooperative with staff. Denies SI/HI/A/V hallucinations. Med compliant. PRN med given with good effect. Will encourage continue compliance and progression towards goals. Verbally contracted for safety. Will continue to monitor.

## 2022-12-12 NOTE — Group Note (Signed)
Date:  12/12/2022 Time:  4:58 PM  Group Topic/Focus:  Wellness Toolbox:   The focus of this group is to discuss various aspects of wellness, balancing those aspects and exploring ways to increase the ability to experience wellness.  Patients will create a wellness toolbox for use upon discharge.    Participation Level:  Active  Participation Quality:  Appropriate  Affect:  Appropriate  Cognitive:  Appropriate  Insight: Appropriate  Engagement in Group:  Engaged  Modes of Intervention:  Discussion  Additional Comments:  Pt participated in music therapy  Garvin Fila 12/12/2022, 4:58 PM

## 2022-12-12 NOTE — Plan of Care (Signed)
  Problem: Safety: Goal: Periods of time without injury will increase Outcome: Progressing   

## 2022-12-12 NOTE — Progress Notes (Signed)
Pt denies SI/HI/AVH.  Pt says that his anxiety and depressive symptoms are improving.  Pt attending groups and interacting with peers appropriately.  Pt took medications without incident and no adverse reactions are noted.  RN will continue to assess for concerns and provide assistance as needed.

## 2022-12-13 MED ORDER — LOPERAMIDE HCL 2 MG PO CAPS: 2.0000 mg | ORAL_CAPSULE | ORAL | Status: AC | PRN

## 2022-12-13 MED ORDER — METHOCARBAMOL 500 MG PO TABS: 500.0000 mg | ORAL_TABLET | Freq: Three times a day (TID) | ORAL | Status: DC | PRN

## 2022-12-13 MED ORDER — ONDANSETRON 4 MG PO TBDP: 4.0000 mg | ORAL_TABLET | Freq: Four times a day (QID) | ORAL | Status: AC | PRN

## 2022-12-13 MED ORDER — DICYCLOMINE HCL 20 MG PO TABS: 20.0000 mg | ORAL_TABLET | Freq: Four times a day (QID) | ORAL | Status: AC | PRN

## 2022-12-13 MED ORDER — NAPROXEN 500 MG PO TABS
500.0000 mg | ORAL_TABLET | Freq: Two times a day (BID) | ORAL | Status: AC | PRN
  Administered 2022-12-14: 500 mg via ORAL
  Filled 2022-12-13 (×2): qty 1

## 2022-12-13 MED ORDER — BUSPIRONE HCL 10 MG PO TABS
10.0000 mg | ORAL_TABLET | Freq: Three times a day (TID) | ORAL | Status: DC
  Administered 2022-12-13 – 2022-12-14 (×3): 10 mg via ORAL
  Filled 2022-12-13 (×3): qty 2
  Filled 2022-12-13 (×3): qty 1
  Filled 2022-12-13 (×2): qty 2

## 2022-12-13 NOTE — [facility] Group Notes (Signed)
Pt did not attend wrap-up group   

## 2022-12-13 NOTE — Progress Notes (Signed)
[facility] MD Progress Note  12/13/2022 1:41 PM CHARMING ANTONOVICH  MRN:  [id number] Principal Problem: Severe bipolar I disorder, current or most recent episode depressed ([place]) Diagnosis: Principal Problem:   Severe bipolar I disorder, current or most recent episode depressed ([place]) Active Problems:   ADHD (attention deficit hyperactivity disorder)   GAD (generalized anxiety disorder)   Benzodiazepine dependence ([place])   Insomnia  Reason for admission: Jesse Jones is a 42 yo Caucasian male with past mental health history of ADHD, alcohol use disorder in sustained remission & GAD who presented to the [facility] on 02/08 after intentionally taking 12 mg of Xanax in a suicide attempt.  Patient is a poor historian, and initially stated he did not know why he overdosed, but then stated it was because his parents got into an argument over his medications.  Patient was IVC'd and transferred to this behavioral health Hospital for treatment and stabilization of his mental status.  24 hr chart review: The patient was seen today and the chart was reviewed and the case was discussed with nursing staff.  Staff reports that he slept 6.5 hours.  He rates his anxiety at 3/10 and depression is 6/10.  He is still focused on some of his medications.  No behavioral episodes were noted in the past 24 hours.  No PRNs other than trazodone for sleep was given.Vital signs were stable except for a pulse of 106.  Patient assessment note, 2/15/024: The patient is alert oriented and cooperative.  He maintained fair eye contact.  Speech was without any obvious looseness of associations flight of ideas or tangentiality.  He denies SI/HI/AVH.  He is cognitively intact.  He does endorse anxiety about stopping his medications especially the Adderall.  He reports that he slept well and has a good appetite.  He is looking forward to the new medications being started.  He is open to the idea of trying Strattera which was started at 10 mg  daily and will be titrated up.   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   Outbursts of anger    Preventative health care 10/03/2011   Tobacco abuse 04/28/2011    Past Surgical History:  Procedure Laterality Date   NASAL SEPTUM SURGERY Bilateral    Dr Redmond Baseman 2017   punctured lung     toe surgeries  during childhood    for ingrown toenails   Family History:  Family History  Problem Relation Age of Onset   Depression Mother    Anxiety disorder Mother    Depression Brother    Diabetes Brother        type 1   Cancer Paternal Grandmother        lung/ didn't smoke   Cancer Maternal Uncle 39       colon cancer   Family Psychiatric  History: See above Social History:  Social History   Substance and Sexual Activity  Alcohol Use Yes   Alcohol/week:  36.0 standard drinks of alcohol   Types: 36 Cans of beer per week   Comment: sober x15yr No alcohol.     Social History   Substance and Sexual Activity  Drug Use Yes   Types: Marijuana, Other-see comments   Comment: pt stopped 4 year ago    Social History   Socioeconomic History   Marital status: Single    Spouse name: Not on file   Number of children: 0   Years of education: 12   Highest education level: 12th grade  Occupational History   Not on file  Tobacco Use   Smoking status: Former    Packs/day: 0.30    Years: 20.00    Total pack years: 6.00    Types: Cigarettes    Passive exposure: Current   Smokeless tobacco: Never   Tobacco  comments:    Discussed Smoking Cessation and Alcohol and Drug Services  Vaping Use   Vaping Use: Never used  Substance and Sexual Activity   Alcohol use: Yes    Alcohol/week: 36.0 standard drinks of alcohol    Types: 36 Cans of beer per week    Comment: sober x465yrNo alcohol.   Drug use: Yes    Types: Marijuana, Other-see comments    Comment: pt stopped 4 year ago   Sexual activity: Yes    Partners: Female    Comment: lives alone and eating well. exercising  Other Topics Concern   Not on file  Social History Narrative   Not on file   Social Determinants of Health   Financial Resource Strain: Low Risk  (03/02/2022)   Overall Financial Resource Strain (CARDIA)    Difficulty of Paying Living Expenses: Not hard at all  Food Insecurity: No Food Insecurity (12/08/2022)   Hunger Vital Sign    Worried About Running Out of Food in the Last Year: Never true    Ran Out of Food in the Last Year: Never true  Transportation Needs: No Transportation Needs (12/08/2022)   PRAPARE - TrHydrologistMedical): No    Lack of Transportation (Non-Medical): No  Physical Activity: Inactive (03/02/2022)   Exercise Vital Sign    Days of Exercise per Week: 0 days    Minutes of Exercise per Session: 0 min  Stress: Stress Concern Present (03/02/2022)   [place]  Feeling of Stress : Very much  Social Connections: Socially Isolated (03/02/2022)   Social Connection and Isolation Panel [NHANES]    Frequency of Communication with Friends and Family: More than three times a week    Frequency of Social Gatherings with Friends and Family: More than three times a week    Attends Religious Services: Never    AcMarine scientistr Organizations: No    Attends ClMusic therapistNever    Marital Status: Never married   sleep: Good  Appetite:  Poor  Current Medications: Current Facility-Administered  Medications  Medication Dose Route Frequency Provider Last Rate Last Admin   acetaminophen (TYLENOL) tablet 650 mg  650 mg Oral Q6H PRN Motley-Mangrum, Jadeka A, PMHNP   650 mg at 12/12/22 0628   alum & mag hydroxide-simeth (MAALOX/MYLANTA) 200-200-20 MG/5ML suspension 30 mL  30 mL Oral Q4H PRN Motley-Mangrum, Jadeka A, PMHNP       atomoxetine (STRATTERA) capsule 10 mg  10 mg Oral Daily Nkwenti, Doris, NP   10 mg at 12/13/22 0838   escitalopram (  LEXAPRO) tablet 20 mg  20 mg Oral Daily Minda Ditto, RPH   20 mg at 12/13/22 [id number]   feeding supplement (ENSURE ENLIVE / ENSURE PLUS) liquid 237 mL  237 mL Oral TID BM Nkwenti, Doris, NP   237 mL at 12/12/22 2000   fluticasone (FLONASE) 50 MCG/ACT nasal spray 2 spray  2 spray Each Nare Daily PRN Motley-Mangrum, Jadeka A, PMHNP       gabapentin (NEURONTIN) capsule 300 mg  300 mg Oral TID Nicholes Rough, NP   300 mg at 12/13/22 [id number]   magnesium hydroxide (MILK OF MAGNESIA) suspension 30 mL  30 mL Oral Daily PRN Motley-Mangrum, Jadeka A, PMHNP       multivitamin with minerals tablet 1 tablet  1 tablet Oral q morning Motley-Mangrum, Jadeka A, PMHNP   1 tablet at 12/13/22 1007   OLANZapine (ZYPREXA) tablet 15 mg  15 mg Oral QHS Motley-Mangrum, Jadeka A, PMHNP   15 mg at 12/12/22 2127   OLANZapine zydis (ZYPREXA) disintegrating tablet 5 mg  5 mg Oral Q8H PRN Motley-Mangrum, Jadeka A, PMHNP       And   ziprasidone (GEODON) injection 20 mg  20 mg Intramuscular PRN Motley-Mangrum, Jadeka A, PMHNP       omega-3 acid ethyl esters (LOVAZA) capsule 2 g  2 g Oral Daily Motley-Mangrum, Jadeka A, PMHNP   2 g at 12/13/22 0837   thiamine (Vitamin B-1) tablet 100 mg  100 mg Oral Daily Leevy-Johnson, Brooke A, NP   100 mg at 12/13/22 [id number]   traZODone (DESYREL) tablet 50 mg  50 mg Oral QHS PRN Motley-Mangrum, Al Pimple, PMHNP   50 mg at 12/12/22 2129    Lab Results:  Results for orders placed or performed during the hospital encounter of 12/08/22 (from the past 48 hour(s))   Urinalysis, Complete w Microscopic -Urine, Clean Catch     Status: None   Collection Time: 12/11/22  6:35 PM  Result Value Ref Range   Color, Urine YELLOW YELLOW   APPearance CLEAR CLEAR   Specific Gravity, Urine 1.010 1.005 - 1.030   pH 7.0 5.0 - 8.0   Glucose, UA NEGATIVE NEGATIVE mg/dL   Hgb urine dipstick NEGATIVE NEGATIVE   Bilirubin Urine NEGATIVE NEGATIVE   Ketones, ur NEGATIVE NEGATIVE mg/dL   Protein, ur NEGATIVE NEGATIVE mg/dL   Nitrite NEGATIVE NEGATIVE   Leukocytes,Ua NEGATIVE NEGATIVE   RBC / HPF 0-5 0 - 5 RBC/hpf   WBC, UA 0-5 0 - 5 WBC/hpf   Bacteria, UA NONE SEEN NONE SEEN   Squamous Epithelial / HPF 0-5 0 - 5 /HPF    Comment: Performed at [facility], [place] [address] [place], [place] [address]     Blood Alcohol level:  Lab Results  Component Value Date   ETH <11 02/10/2013   ETH (H) 12/25/2008    181        LOWEST DETECTABLE LIMIT FOR SERUM ALCOHOL IS 5 mg/dL FOR MEDICAL PURPOSES ONLY    Metabolic Disorder Labs: Lab Results  Component Value Date   HGBA1C 4.6 (L) 12/09/2022   MPG 85.32 12/09/2022   MPG 97 06/23/2020   No results found for: "PROLACTIN" Lab Results  Component Value Date   CHOL 191 12/09/2022   TRIG 42 12/09/2022   HDL 60 12/09/2022   CHOLHDL 3.2 12/09/2022   VLDL 8 12/09/2022   LDLCALC 123 (H) 12/09/2022   LDLCALC 109 (H) 01/31/2022    Physical Findings: AIMS:  0 CIWA:  CIWA-Ar Total: 0 COWS:     Musculoskeletal: Strength & Muscle Tone: within normal limits Gait & Station: normal Patient leans: N/A  Psychiatric Specialty Exam:  Presentation  General Appearance:  Appropriate for Environment  Eye Contact: Fair  Speech: Slow  Speech Volume: Decreased  Handedness: Right   Mood and Affect  Mood: Anxious; Depressed  Affect: Constricted   Thought Process  Thought Processes: Linear  Descriptions of Associations:Intact  Orientation:Full (Time, Place and Person)  Thought  Content:Logical  History of Schizophrenia/Schizoaffective disorder:No  Duration of Psychotic Symptoms:No data recorded Hallucinations:Hallucinations: None  Ideas of Reference:None  Suicidal Thoughts:Suicidal Thoughts: No  Homicidal Thoughts:Homicidal Thoughts: No   Sensorium  Memory: Immediate Fair; Remote Fair; Recent Fair  Judgment: Fair  Insight: Fair   Community education officer  Concentration: Fair  Attention Span: Fair  Recall: [employer] of Knowledge: Fair  Language: Fair   Psychomotor Activity  Psychomotor Activity: Psychomotor Activity: Normal   Assets  Assets: Communication Skills; Housing   Sleep  Sleep: Sleep: Fair    Physical Exam: Physical Exam Constitutional:      Appearance: Normal appearance.  Eyes:     Pupils: Pupils are equal, round, and reactive to light.  Musculoskeletal:        General: Normal range of motion.     Cervical back: Normal range of motion.  Neurological:     Mental Status: He is oriented to person, place, and time.    Review of Systems  Constitutional: Negative.   HENT: Negative.    Eyes: Negative.   Respiratory: Negative.    Cardiovascular: Negative.   Gastrointestinal: Negative.   Genitourinary: Negative.   Musculoskeletal: Negative.   Skin: Negative.   Neurological: Negative.  Negative for dizziness.  Psychiatric/Behavioral:  Positive for depression. Negative for hallucinations, memory loss and suicidal ideas.    Blood pressure 120/87, pulse (!) 106, temperature 97.8 F (36.6 C), temperature source Oral, resp. rate 18, height 6' 2"$  (1.88 m), weight 86.2 kg, SpO2 98 %. Body mass index is 24.39 kg/m.  Treatment Plan Summary: Daily contact with patient to assess and evaluate symptoms and progress in treatment and Medication management  Observation Level/Precautions:  15 minute checks  Laboratory:  Labs reviewed: Qtc on EKG slightly prolonged at 464. Repeat EKG ordered forl trending while on  antipsychotics.   Psychotherapy:  Unit Group sessions  Medications:  See [facility]  Consultations:  To be determined   Discharge Concerns:  Safety, medication compliance, mood stability  Estimated LOS: 5-7 days  Other:  N/A   Labs independently reviewed on 12/12/2022. Qtc on EKG slightly prolonged at 464. Repeat EKG ordered for trending while on antipsychotics with QTC 403. Ordered baseline UA which is WNL.   PLAN Safety and Monitoring: Voluntary admission to inpatient psychiatric unit for safety, stabilization and treatment Daily contact with patient to assess and evaluate symptoms and progress in treatment Patient's case to be discussed in multi-disciplinary team meeting Observation Level : q15 minute checks Vital signs: q12 hours Precautions: Safety  Long Term Goal(s): Improvement in symptoms so as ready for discharge  Short Term Goals: Ability to identify changes in lifestyle to reduce recurrence of condition will improve, Ability to disclose and discuss suicidal ideas, Ability to demonstrate self-control will improve, Ability to identify and develop effective coping behaviors will improve, Ability to maintain clinical measurements within normal limits will improve, Compliance with prescribed medications will improve, and Ability to identify triggers associated with substance abuse/mental health issues will improve  Diagnoses Principal  Problem:   Severe bipolar I disorder, current or most recent episode depressed ([place]) Active Problems:   ADHD (attention deficit hyperactivity disorder)   GAD (generalized anxiety disorder)   Benzodiazepine dependence ([facility])   Insomnia  Medications -Tapering Adderall 30 mg off as pt had withdrawal symptoms r/t abrupt cessation-Give 15 mg on 2/13 followed by 10 mg on 2/14, then 5 mg on 2/15, then stop.  -Increase Gabapentin to 300 mg TID for anxiety  -Continue Strattera 10 mg daily for ADD  -Continue Lexapro 20 mg for depressive symptoms -Continue  Zyprexa 15 mg nightly for mood stabilization -Continue Ativan detox protocol-please see MAR for complete order -Continue Ensures TID for nutritional support -Discontinued BuSpar PRN on 2/12 -Continue hydroxyzine 25 mg as needed 3 times daily for anxiety -Continue trazodone 50 mg p.o. Ureh nightly for sleep -Continue Lovaza 2 g daily for hyperlipidemia Patient educated on rationales, benefits and possible side effects of all medications listed above, and agreeable with trials, and plan.  Other PRNS -Continue Tylenol 650 mg every 6 hours PRN for mild pain -Continue Maalox 30 mg every 4 hrs PRN for indigestion -Continue Imodium 2-4 mg as needed for diarrhea -Continue Milk of Magnesia as needed every 6 hrs for constipation -Continue Zofran disintegrating tabs every 6 hrs PRN for nausea   Discharge Planning: Social work and case management to assist with discharge planning and identification of hospital follow-up needs prior to discharge Estimated LOS: Possible discharge by Monday Discharge Concerns: Need to establish a safety plan; Medication compliance and effectiveness Discharge Goals: Return home with outpatient referrals for mental health follow-up including medication management/psychotherapy  I certify that inpatient services furnished can reasonably be expected to improve the patient's condition.    Ranae Palms, MD 12/13/2022, 1:41 PMPatient ID: [name], male   DOB: 18-Oct-1981, 42 y.o.   MRN: [id number] Total Time Spent in Direct Patient Care:  I personally spent 30 minutes on the unit in direct patient care. The direct patient care time included face-to-face time with the patient, reviewing the patient's chart, communicating with other professionals, and coordinating care. Greater than 50% of this time was spent in counseling or coordinating care with the patient regarding goals of hospitalization, psycho-education, and discharge planning needs.   [place] Psychiatrist

## 2022-12-13 NOTE — [facility] Group Notes (Signed)
[place] Group Notes:  (Nursing/MHT/Case Management/Adjunct)  Date:  12/13/2022  Time:  2:42 PM  Type of Therapy:  Group Therapy  Participation Level:  Did Not Attend  Summary of Progress/Problems:  Patient did not attend [place] group today.  Elza Rafter 12/13/2022, 2:42 PM

## 2022-12-13 NOTE — Progress Notes (Signed)
Patient observed sleeping in this morning. Patient had breakfast this morning but did not eat lunch. Patient observed sleeping most of the day and did not attend group or recreation activities. Patient received maalox po prn due to an upset stomach. Patient appears more alert this evening. Patient reporting malaise and anxiety. MD notified and updated.

## 2022-12-13 NOTE — Group Note (Signed)
LCSW Group Therapy Note   Group Date: 12/13/2022 Start Time: 1100 End Time: 1200  Type of Therapy and Topic:  Group Therapy - Healthy vs Unhealthy Coping Skills  Participation Level:  Did Not Attend   Description of Group The focus of this group was to determine what unhealthy coping techniques typically are used by group members and what healthy coping techniques would be helpful in coping with various problems. Patients were guided in becoming aware of the differences between healthy and unhealthy coping techniques. Patients were asked to identify 2-3 healthy coping skills they would like to learn to use more effectively.  Therapeutic Goals Patients learned that coping is what human beings do all day long to deal with various situations in their lives Patients defined and discussed healthy vs unhealthy coping techniques Patients identified their preferred coping techniques and identified whether these were healthy or unhealthy Patients determined 2-3 healthy coping skills they would like to become more familiar with and use more often. Patients provided support and ideas to each other   Summary of Patient Progress:  Did not attend    Therapeutic Modalities Cognitive Behavioral Therapy Motivational Interviewing  Darleen Crocker, Latanya Presser 12/13/2022  1:03 PM

## 2022-12-13 NOTE — Plan of Care (Signed)
  Problem: Activity: Goal: Sleeping patterns will improve Outcome: Progressing   Problem: Health Behavior/Discharge Planning: Goal: Compliance with treatment plan for underlying cause of condition will improve Outcome: Progressing   Problem: Safety: Goal: Periods of time without injury will increase Outcome: Progressing   

## 2022-12-14 ENCOUNTER — Encounter (HOSPITAL_COMMUNITY): Payer: Self-pay

## 2022-12-14 MED ORDER — ATOMOXETINE HCL 10 MG PO CAPS
20.0000 mg | ORAL_CAPSULE | Freq: Two times a day (BID) | ORAL | Status: DC
  Administered 2022-12-14 – 2022-12-16 (×5): 20 mg via ORAL
  Filled 2022-12-14 (×8): qty 2

## 2022-12-14 MED ORDER — OLANZAPINE 10 MG PO TABS
10.0000 mg | ORAL_TABLET | Freq: Every day | ORAL | Status: DC
  Administered 2022-12-14: 10 mg via ORAL
  Filled 2022-12-14 (×3): qty 1

## 2022-12-14 MED ORDER — CLONAZEPAM 0.5 MG PO TABS
0.5000 mg | ORAL_TABLET | Freq: Two times a day (BID) | ORAL | Status: DC
  Administered 2022-12-14 – 2022-12-16 (×5): 0.5 mg via ORAL
  Filled 2022-12-14 (×6): qty 1

## 2022-12-14 NOTE — Plan of Care (Signed)
  Problem: Safety: Goal: Periods of time without injury will increase Outcome: Progressing   

## 2022-12-14 NOTE — Progress Notes (Signed)
[facility] MD Progress Note  12/14/2022 2:14 PM Jesse Jones  MRN:  [id number] Principal Problem: Severe bipolar I disorder, current or most recent episode depressed ([place]) Diagnosis: Principal Problem:   Severe bipolar I disorder, current or most recent episode depressed ([place]) Active Problems:   ADHD (attention deficit hyperactivity disorder)   GAD (generalized anxiety disorder)   Benzodiazepine dependence ([place])   Insomnia  Reason for admission: Jesse Jones is a 42 yo Caucasian male with past mental health history of ADHD, alcohol use disorder in sustained remission & GAD who presented to the [facility] on 02/08 after intentionally taking 12 mg of Xanax in a suicide attempt.  Patient is a poor historian, and initially stated he did not know why he overdosed, but then stated it was because his parents got into an argument over his medications.  Patient was IVC'd and transferred to this behavioral health Hospital for treatment and stabilization of his mental status.  24 hr chart review: The patient was seen today and the chart was reviewed and the case was discussed with nursing staff.  Staff reports that the patient has been very irritable and labile.  Last night he was quite agitated and received as needed Geodon.  His sleep was poor.  His vital signs remained stable except for a pulse of 130.  Patient assessment note, 2/16/024: The patient was seen twice today.  In the early a.m. patient was alert somewhat sedated and frustrated and appeared quite upset about not having his medications.  He reports poor sleep and claims that he may have had a seizure last night referring to the episode when he was quite agitated.  He denies psychosis or delusions but does admit to significant anxiety and extreme distractibility.  Patient has agreed to take the Strattera and has been on the BuSpar.  Patient's medications were adjusted and when he was seen later this afternoon he was alert oriented and  cooperative but mildly sedated.  He reports that the medications are working and he feels better.  He denies psychosis denies delusions denies depression and seems to be cognitively intact.  However he was minimally interactive and preferred to sleep reporting that he wanted to catch up on his sleep.  He denies any SI/HI/AVH.  The patient's parents were contacted to obtain additional psychosocial information about his premorbid functioning.  Apparently patient did extremely well on the combination of clonazepam and Adderall for many years.  However there was some concern about him abusing Adderall and the family wanted him to be off the Adderall if possible.  He also had an episode of depression and had to be seen by psychiatrist a year ago.  After he was changed from clonazepam to Xanax XR, family reports that he did not do very well and continued to remain in a fog.  Contacted Silvio Pate at [phone] and discussed his medications briefly and informed her about the possible change to clonazepam.  She is agreeable with the plan.    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   Depression    ED (erectile dysfunction) 05/12/2012  Hyperlipidemia, mild 04/18/2015   Insomnia    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   Outbursts of anger    Preventative health care 10/03/2011   Tobacco abuse 04/28/2011    Past Surgical History:  Procedure Laterality Date   NASAL SEPTUM SURGERY Bilateral    Dr Redmond Baseman 2017   punctured lung     toe surgeries  during childhood    for ingrown toenails   Family History:  Family History  Problem Relation Age of Onset    Depression Mother    Anxiety disorder Mother    Depression Brother    Diabetes Brother        type 1   Cancer Paternal Grandmother        lung/ didn't smoke   Cancer Maternal Uncle 42       colon cancer   Family Psychiatric  History: See above Social History:  Social History   Substance and Sexual Activity  Alcohol Use Yes   Alcohol/week: 36.0 standard drinks of alcohol   Types: 36 Cans of beer per week   Comment: sober x47yr No alcohol.     Social History   Substance and Sexual Activity  Drug Use Yes   Types: Marijuana, Other-see comments   Comment: pt stopped 4 year ago    Social History   Socioeconomic History   Marital status: Single    Spouse name: Not on file   Number of children: 0   Years of education: 12   Highest education level: 12th grade  Occupational History   Not on file  Tobacco Use   Smoking status: Former    Packs/day: 0.30    Years: 20.00    Total pack years: 6.00    Types: Cigarettes    Passive exposure: Current   Smokeless tobacco: Never   Tobacco comments:    Discussed Smoking Cessation and Alcohol and Drug Services  Vaping Use   Vaping Use: Never used  Substance and Sexual Activity   Alcohol use: Yes    Alcohol/week: 36.0 standard drinks of alcohol    Types: 36 Cans of beer per week    Comment: sober x477yrNo alcohol.   Drug use: Yes    Types: Marijuana, Other-see comments    Comment: pt stopped 4 year ago   Sexual activity: Yes    Partners: Female    Comment: lives alone and eating well. exercising  Other Topics Concern   Not on file  Social History Narrative   Not on file   Social Determinants of Health   Financial Resource Strain: Low Risk  (03/02/2022)   Overall Financial Resource Strain (CARDIA)    Difficulty of Paying Living Expenses: Not hard at all  Food Insecurity: No Food Insecurity (12/08/2022)   Hunger Vital Sign    Worried About Running Out of Food in the Last Year: Never true    Ran Out of Food in the Last  Year: Never true  Transportation Needs: No Transportation Needs (12/08/2022)   PRAPARE - TrHydrologistMedical): No    Lack of Transportation (Non-Medical): No  Physical Activity: Inactive (03/02/2022)   Exercise Vital Sign    Days of Exercise per Week: 0 days    Minutes of Exercise per Session: 0 min  Stress: Stress Concern Present (03/02/2022)   [place]  Feeling of Stress : Very much  Social Connections: Socially Isolated (03/02/2022)   Social Connection and  Isolation Panel [NHANES]    Frequency of Communication with Friends and Family: More than three times a week    Frequency of Social Gatherings with Friends and Family: More than three times a week    Attends Religious Services: Never    Marine scientist or Organizations: No    Attends Music therapist: Never    Marital Status: Never married   sleep: Good  Appetite:  Poor  Current Medications: Current Facility-Administered Medications  Medication Dose Route Frequency Provider Last Rate Last Admin   alum & mag hydroxide-simeth (MAALOX/MYLANTA) 200-200-20 MG/5ML suspension 30 mL  30 mL Oral Q4H PRN Motley-Mangrum, Jadeka A, PMHNP   30 mL at 12/13/22 1407   atomoxetine (STRATTERA) capsule 20 mg  20 mg Oral BID WC Ranae Palms, MD   20 mg at 12/14/22 1304   clonazePAM (KLONOPIN) tablet 0.5 mg  0.5 mg Oral BID Ranae Palms, MD   0.5 mg at 12/14/22 1304   dicyclomine (BENTYL) tablet 20 mg  20 mg Oral Q6H PRN Onuoha, Chinwendu V, NP       escitalopram (LEXAPRO) tablet 20 mg  20 mg Oral Daily Minda Ditto, RPH   20 mg at 12/14/22 [id number]   feeding supplement (ENSURE ENLIVE / ENSURE PLUS) liquid 237 mL  237 mL Oral TID BM Nkwenti, Doris, NP   237 mL at 12/13/22 2100   fluticasone (FLONASE) 50 MCG/ACT nasal spray 2 spray  2 spray Each Nare Daily PRN Motley-Mangrum, Jadeka A, PMHNP       gabapentin (NEURONTIN) capsule 300  mg  300 mg Oral TID Nicholes Rough, NP   300 mg at 12/14/22 0631   loperamide (IMODIUM) capsule 2-4 mg  2-4 mg Oral PRN Onuoha, Chinwendu V, NP       magnesium hydroxide (MILK OF MAGNESIA) suspension 30 mL  30 mL Oral Daily PRN Motley-Mangrum, Jadeka A, PMHNP       methocarbamol (ROBAXIN) tablet 500 mg  500 mg Oral Q8H PRN Onuoha, Chinwendu V, NP       multivitamin with minerals tablet 1 tablet  1 tablet Oral q morning Motley-Mangrum, Jadeka A, PMHNP   1 tablet at 12/14/22 0838   naproxen (NAPROSYN) tablet 500 mg  500 mg Oral BID PRN Onuoha, Chinwendu V, NP       OLANZapine (ZYPREXA) tablet 10 mg  10 mg Oral QHS Merion Caton, MD       OLANZapine zydis (ZYPREXA) disintegrating tablet 5 mg  5 mg Oral Q8H PRN Motley-Mangrum, Jadeka A, PMHNP   5 mg at 12/14/22 0631   omega-3 acid ethyl esters (LOVAZA) capsule 2 g  2 g Oral Daily Motley-Mangrum, Jadeka A, PMHNP   2 g at 12/14/22 0838   ondansetron (ZOFRAN-ODT) disintegrating tablet 4 mg  4 mg Oral Q6H PRN Onuoha, Chinwendu V, NP       thiamine (Vitamin B-1) tablet 100 mg  100 mg Oral Daily Leevy-Johnson, Brooke A, NP   100 mg at 12/14/22 [id number]    Lab Results:  No results found for this or any previous visit (from the past 10 hour(s)).    Blood Alcohol level:  Lab Results  Component Value Date   ETH <11 02/10/2013   ETH (H) 12/25/2008    181        LOWEST DETECTABLE LIMIT FOR SERUM ALCOHOL IS 5 mg/dL FOR MEDICAL PURPOSES ONLY    Metabolic Disorder Labs: Lab Results  Component Value Date   HGBA1C 4.6 (L) 12/09/2022  MPG 85.32 12/09/2022   MPG 97 06/23/2020   No results found for: "PROLACTIN" Lab Results  Component Value Date   CHOL 191 12/09/2022   TRIG 42 12/09/2022   HDL 60 12/09/2022   CHOLHDL 3.2 12/09/2022   VLDL 8 12/09/2022   LDLCALC 123 (H) 12/09/2022   LDLCALC 109 (H) 01/31/2022    Physical Findings: AIMS:  0 CIWA:  CIWA-Ar Total: 2 COWS:  COWS Total Score: 1  Musculoskeletal: Strength & Muscle Tone: within  normal limits Gait & Station: normal Patient leans: N/A  Psychiatric Specialty Exam:  Presentation  General Appearance:  Appropriate for Environment; Disheveled  Eye Contact: Fair  Speech: Slow  Speech Volume: Decreased  Handedness: Right   Mood and Affect  Mood: Anxious; Irritable  Affect: Blunt; Constricted   Thought Process  Thought Processes: Coherent  Descriptions of Associations:Intact  Orientation:Full (Time, Place and Person)  Thought Content:Perseveration; Rumination  History of Schizophrenia/Schizoaffective disorder:No  Duration of Psychotic Symptoms:No data recorded Hallucinations:Hallucinations: None  Ideas of Reference:None  Suicidal Thoughts:Suicidal Thoughts: No  Homicidal Thoughts:Homicidal Thoughts: No   Sensorium  Memory: Immediate Fair; Remote Fair  Judgment: Poor  Insight: Fair   Community education officer  Concentration: Fair  Attention Span: Poor  Recall: [employer] of Knowledge: Fair  Language: Fair   Psychomotor Activity  Psychomotor Activity: Psychomotor Activity: Decreased   Assets  Assets: Desire for Improvement; Housing; Leisure Time; Social Support   Sleep  Sleep: Sleep: Poor    Physical Exam: Physical Exam Vitals and nursing note reviewed.  Constitutional:      Appearance: Normal appearance.  Eyes:     Pupils: Pupils are equal, round, and reactive to light.  Musculoskeletal:        General: Normal range of motion.     Cervical back: Normal range of motion.  Neurological:     Mental Status: He is oriented to person, place, and time.    Review of Systems  Constitutional: Negative.   HENT: Negative.    Eyes: Negative.   Respiratory: Negative.    Cardiovascular: Negative.   Gastrointestinal: Negative.   Genitourinary: Negative.   Musculoskeletal: Negative.   Skin: Negative.   Neurological: Negative.  Negative for dizziness.  Psychiatric/Behavioral:  Negative for hallucinations,  memory loss and suicidal ideas. The patient is nervous/anxious.    Blood pressure 113/78, pulse (!) 130, temperature 98.1 F (36.7 C), temperature source Oral, resp. rate 20, height 6' 2"$  (1.88 m), weight 86.2 kg, SpO2 96 %. Body mass index is 24.39 kg/m.  Treatment Plan Summary: Daily contact with patient to assess and evaluate symptoms and progress in treatment and Medication management  Observation Level/Precautions:  15 minute checks  Laboratory:  Labs reviewed: Qtc on EKG slightly prolonged at 464. Repeat EKG ordered forl trending while on antipsychotics.   Psychotherapy:  Unit Group sessions  Medications:  See [facility]  Consultations:  To be determined   Discharge Concerns:  Safety, medication compliance, mood stability  Estimated LOS: 5-7 days  Other:  N/A   Labs independently reviewed on 12/12/2022. Qtc on EKG slightly prolonged at 464. Repeat EKG ordered for trending while on antipsychotics with QTC 403. Ordered baseline UA which is WNL.   PLAN Safety and Monitoring: Voluntary admission to inpatient psychiatric unit for safety, stabilization and treatment Daily contact with patient to assess and evaluate symptoms and progress in treatment Patient's case to be discussed in multi-disciplinary team meeting Observation Level : q15 minute checks Vital signs: q12 hours Precautions: Safety  Long Term Goal(s): Improvement in symptoms so as ready for discharge  Short Term Goals: Ability to identify changes in lifestyle to reduce recurrence of condition will improve, Ability to disclose and discuss suicidal ideas, Ability to demonstrate self-control will improve, Ability to identify and develop effective coping behaviors will improve, Ability to maintain clinical measurements within normal limits will improve, Compliance with prescribed medications will improve, and Ability to identify triggers associated with substance abuse/mental health issues will improve  Diagnoses Principal  Problem:   Severe bipolar I disorder, current or most recent episode depressed ([place]) Active Problems:   ADHD (attention deficit hyperactivity disorder)   GAD (generalized anxiety disorder)   Benzodiazepine dependence ([place])   Insomnia  Medications -Tapering Adderall 30 mg off as pt had withdrawal symptoms r/t abrupt cessation-Give 15 mg on 2/13 followed by 10 mg on 2/14, then 5 mg on 2/15, then stop.  -Increase Gabapentin to 300 mg TID for anxiety  -Increase Strattera to 20 mg p.o. twice daily  -Continue Lexapro 20 mg for depressive symptoms -Taper Zyprexa 10 mg nightly for mood stabilization -Continue Ensures TID for nutritional support -Discontinued BuSpar on 12/16 -Continue hydroxyzine 25 mg as needed 3 times daily for anxiety -Discontinue trazodone 50 mg p.o. on 12/16 -Continue Lovaza 2 g daily for hyperlipidemia Patient educated on rationales, benefits and possible side effects of all medications listed above, and agreeable with trials, and plan. Begin clonazepam 0.5 mg p.o. twice daily on 12/16  Other PRNS -Continue Tylenol 650 mg every 6 hours PRN for mild pain -Continue Maalox 30 mg every 4 hrs PRN for indigestion -Continue Imodium 2-4 mg as needed for diarrhea -Continue Milk of Magnesia as needed every 6 hrs for constipation -Continue Zofran disintegrating tabs every 6 hrs PRN for nausea   Discharge Planning: Social work and case management to assist with discharge planning and identification of hospital follow-up needs prior to discharge Estimated LOS: Possible discharge by Monday Discharge Concerns: Need to establish a safety plan; Medication compliance and effectiveness Discharge Goals: Return home with outpatient referrals for mental health follow-up including medication management/psychotherapy  I certify that inpatient services furnished can reasonably be expected to improve the patient's condition.    Ranae Palms, MD 12/14/2022, 2:14 PMPatient ID: Jesse Jones, male   DOB: May 31, 1981, 42 y.o.   MRN: [id number] Total Time Spent in Direct Patient Care:  I personally spent 30 minutes on the unit in direct patient care. The direct patient care time included face-to-face time with the patient, reviewing the patient's chart, communicating with other professionals, and coordinating care. Greater than 50% of this time was spent in counseling or coordinating care with the patient regarding goals of hospitalization, psycho-education, and discharge planning needs.

## 2022-12-14 NOTE — BH IP Treatment Plan (Signed)
Interdisciplinary Treatment and Diagnostic Plan Update  12/14/2022 Time of Session: 1127 KHAMONI DEUTSCHER MRN: [id number]  Principal Diagnosis: Severe bipolar I disorder, current or most recent episode depressed ([place])  Secondary Diagnoses: Principal Problem:   Severe bipolar I disorder, current or most recent episode depressed ([place]) Active Problems:   ADHD (attention deficit hyperactivity disorder)   GAD (generalized anxiety disorder)   Benzodiazepine dependence ([place])   Insomnia   Current Medications:  Current Facility-Administered Medications  Medication Dose Route Frequency Provider Last Rate Last Admin   alum & mag hydroxide-simeth (MAALOX/MYLANTA) 200-200-20 MG/5ML suspension 30 mL  30 mL Oral Q4H PRN Motley-Mangrum, Jadeka A, PMHNP   30 mL at 12/13/22 1407   atomoxetine (STRATTERA) capsule 20 mg  20 mg Oral BID WC Ranae Palms, MD   20 mg at 12/14/22 1304   clonazePAM (KLONOPIN) tablet 0.5 mg  0.5 mg Oral BID Ranae Palms, MD   0.5 mg at 12/14/22 1304   dicyclomine (BENTYL) tablet 20 mg  20 mg Oral Q6H PRN Onuoha, Chinwendu V, NP       escitalopram (LEXAPRO) tablet 20 mg  20 mg Oral Daily Minda Ditto, RPH   20 mg at 12/14/22 [id number]   feeding supplement (ENSURE ENLIVE / ENSURE PLUS) liquid 237 mL  237 mL Oral TID BM Nkwenti, Doris, NP   237 mL at 12/13/22 2100   fluticasone (FLONASE) 50 MCG/ACT nasal spray 2 spray  2 spray Each Nare Daily PRN Motley-Mangrum, Jadeka A, PMHNP       gabapentin (NEURONTIN) capsule 300 mg  300 mg Oral TID Nicholes Rough, NP   300 mg at 12/14/22 0631   loperamide (IMODIUM) capsule 2-4 mg  2-4 mg Oral PRN Onuoha, Chinwendu V, NP       magnesium hydroxide (MILK OF MAGNESIA) suspension 30 mL  30 mL Oral Daily PRN Motley-Mangrum, Jadeka A, PMHNP       methocarbamol (ROBAXIN) tablet 500 mg  500 mg Oral Q8H PRN Onuoha, Chinwendu V, NP       multivitamin with minerals tablet 1 tablet  1 tablet Oral q morning Motley-Mangrum, Jadeka A, PMHNP   1 tablet at  12/14/22 0838   naproxen (NAPROSYN) tablet 500 mg  500 mg Oral BID PRN Onuoha, Chinwendu V, NP       OLANZapine (ZYPREXA) tablet 10 mg  10 mg Oral QHS Ranae Palms, MD       OLANZapine zydis (ZYPREXA) disintegrating tablet 5 mg  5 mg Oral Q8H PRN Motley-Mangrum, Jadeka A, PMHNP   5 mg at 12/14/22 0631   omega-3 acid ethyl esters (LOVAZA) capsule 2 g  2 g Oral Daily Motley-Mangrum, Jadeka A, PMHNP   2 g at 12/14/22 0838   ondansetron (ZOFRAN-ODT) disintegrating tablet 4 mg  4 mg Oral Q6H PRN Onuoha, Chinwendu V, NP       thiamine (Vitamin B-1) tablet 100 mg  100 mg Oral Daily Leevy-Johnson, Brooke A, NP   100 mg at 12/14/22 [id number]   PTA Medications: Medications Prior to Admission  Medication Sig Dispense Refill Last Dose   ALPRAZolam (XANAX XR) 3 MG 24 hr tablet Take 1 tablet (3 mg total) by mouth daily. 30 tablet 0    amphetamine-dextroamphetamine (ADDERALL XR) 30 MG 24 hr capsule Take 30 mg by mouth every morning.      cholecalciferol (VITAMIN D) 1000 UNITS tablet Take 1,000 Units by mouth every morning.      escitalopram (LEXAPRO) 20 MG tablet Take 1 tablet daily 30  tablet 1    fish oil-omega-3 fatty acids 1000 MG capsule Take 2 g by mouth daily.      fluticasone (FLONASE) 50 MCG/ACT nasal spray SPRAY 2 SPRAYS INTO BOTH NOSTRILS DAILY AS NEEDED FOR ALLERGIES OR RHINITIS. (Patient taking differently: Place 2 sprays into both nostrils daily as needed for allergies.) 48 mL 2    lisdexamfetamine (VYVANSE) 50 MG capsule TAKE 1 CAPSULE BY MOUTH EVERY DAY (Patient not taking: Reported on 12/07/2022) 30 capsule 0    lisdexamfetamine (VYVANSE) 50 MG capsule Take 1 capsule (50 mg total) by mouth daily. (Patient not taking: Reported on 12/07/2022) 30 capsule 0    Multiple Vitamin (MULTIVITAMIN) tablet Take 1 tablet by mouth every morning.      OLANZapine (ZYPREXA) 15 MG tablet Take 15 mg by mouth at bedtime.      vitamin B-12 (CYANOCOBALAMIN) 1000 MCG tablet Take 1,000 mcg by mouth every morning.       vitamin C (ASCORBIC ACID) 500 MG tablet Take 500 mg by mouth daily.       Patient Stressors:  pt reports having suicidal ideations and states that he feared experiencing "DT's"  Patient Strengths:  Supportive Family/Friends  Treatment Modalities: Medication Management, Group therapy, Case management,  1 to 1 session with clinician, Psychoeducation, Recreational therapy.   Physician Treatment Plan for Primary Diagnosis: Severe bipolar I disorder, current or most recent episode depressed ([place]) Long Term Goal(s): Improvement in symptoms so as ready for discharge   Short Term Goals: Ability to identify changes in lifestyle to reduce recurrence of condition will improve Ability to disclose and discuss suicidal ideas Ability to demonstrate self-control will improve Ability to identify and develop effective coping behaviors will improve Ability to maintain clinical measurements within normal limits will improve Compliance with prescribed medications will improve Ability to identify triggers associated with substance abuse/mental health issues will improve  Medication Management: Evaluate patient's response, side effects, and tolerance of medication regimen.  Therapeutic Interventions: 1 to 1 sessions, Unit Group sessions and Medication administration.  Evaluation of Outcomes: Progressing  Physician Treatment Plan for Secondary Diagnosis: Principal Problem:   Severe bipolar I disorder, current or most recent episode depressed ([place]) Active Problems:   ADHD (attention deficit hyperactivity disorder)   GAD (generalized anxiety disorder)   Benzodiazepine dependence ([place])   Insomnia  Long Term Goal(s): Improvement in symptoms so as ready for discharge   Short Term Goals: Ability to identify changes in lifestyle to reduce recurrence of condition will improve Ability to disclose and discuss suicidal ideas Ability to demonstrate self-control will improve Ability to identify and develop  effective coping behaviors will improve Ability to maintain clinical measurements within normal limits will improve Compliance with prescribed medications will improve Ability to identify triggers associated with substance abuse/mental health issues will improve     Medication Management: Evaluate patient's response, side effects, and tolerance of medication regimen.  Therapeutic Interventions: 1 to 1 sessions, Unit Group sessions and Medication administration.  Evaluation of Outcomes: Progressing   RN Treatment Plan for Primary Diagnosis: Severe bipolar I disorder, current or most recent episode depressed ([place]) Long Term Goal(s): Knowledge of disease and therapeutic regimen to maintain health will improve  Short Term Goals: Ability to remain free from injury will improve, Ability to verbalize frustration and anger appropriately will improve, Ability to demonstrate self-control, Ability to participate in decision making will improve, Ability to verbalize feelings will improve, Ability to disclose and discuss suicidal ideas, Ability to identify and develop effective coping  behaviors will improve, and Compliance with prescribed medications will improve  Medication Management: RN will administer medications as ordered by provider, will assess and evaluate patient's response and provide education to patient for prescribed medication. RN will report any adverse and/or side effects to prescribing provider.  Therapeutic Interventions: 1 on 1 counseling sessions, Psychoeducation, Medication administration, Evaluate responses to treatment, Monitor vital signs and CBGs as ordered, Perform/monitor CIWA, COWS, AIMS and Fall Risk screenings as ordered, Perform wound care treatments as ordered.  Evaluation of Outcomes: Progressing   LCSW Treatment Plan for Primary Diagnosis: Severe bipolar I disorder, current or most recent episode depressed ([place]) Long Term Goal(s): Safe transition to appropriate next  level of care at discharge, Engage patient in therapeutic group addressing interpersonal concerns.  Short Term Goals: Engage patient in aftercare planning with referrals and resources, Increase social support, Increase ability to appropriately verbalize feelings, Increase emotional regulation, Facilitate acceptance of mental health diagnosis and concerns, Facilitate patient progression through stages of change regarding substance use diagnoses and concerns, Identify triggers associated with mental health/substance abuse issues, and Increase skills for wellness and recovery  Therapeutic Interventions: Assess for all discharge needs, 1 to 1 time with Social worker, Explore available resources and support systems, Assess for adequacy in community support network, Educate family and significant other(s) on suicide prevention, Complete Psychosocial Assessment, Interpersonal group therapy.  Evaluation of Outcomes: Progressing   Progress in Treatment: Attending groups: Yes. Participating in groups: Yes. Taking medication as prescribed: Yes. Toleration medication: Yes. Family/Significant other contact made: Yes, individual(s) contacted:  Delfino Lovett and Blas Fikes [phone] (Mother and Father)  Patient understands diagnosis: Yes. Discussing patient identified problems/goals with staff: Yes. Medical problems stabilized or resolved: Yes. Denies suicidal/homicidal ideation: Yes. Issues/concerns per patient self-inventory: Yes. Other:   New problem(s) identified: No, Describe:  No reported  New Short Term/Long Term Goal(s): stabilization, elimination of SI thoughts, development of comprehensive mental wellness plan.  medication   Patient Goals:  Medication Stabilization/Coping Skills  Discharge Plan or Barriers: None Reported  Reason for Continuation of Hospitalization: Anxiety Depression Medication stabilization Suicidal ideation Withdrawal symptoms  Estimated Length of Stay: 3-7  Days  Last 3 [place] Suicide Severity Risk Score: [place] Admission (Current) from 12/08/2022 in [place] [facility] ED from 12/06/2022 in [facility] Department [facility] [place] [facility] from 03/02/2022 in [facility] at [place] Low Risk High Risk High Risk       Last [facility] 2/9 Scores:    04/20/2022   11:07 AM 03/02/2022   11:42 AM 02/27/2022   10:45 AM  Depression screen PHQ 2/9  Decreased Interest 3 3 3  $ Down, Depressed, Hopeless 3 3 3  $ PHQ - 2 Score 6 6 6  $ Altered sleeping 3 3 3  $ Tired, decreased energy 3 2 2  $ Change in appetite 3 0 0  Feeling bad or failure about yourself  3 3 3  $ Trouble concentrating 3 3 3  $ Moving slowly or fidgety/restless 3 3 3  $ Suicidal thoughts 1 0 3  PHQ-9 Score 25 20 23  $ Difficult doing work/chores Extremely dIfficult Extremely dIfficult Extremely dIfficult   detox, medication management for mood stabilization; elimination of SI thoughts; development of comprehensive mental wellness/sobriety plan  Scribe for Treatment Team: Windle Guard, LCSW 12/14/2022 2:46 PM

## 2022-12-14 NOTE — Progress Notes (Signed)
Patient denies SI, HI, and AVH this shift. Patient has been isolative to room. Patient exhibited no behavioral dyscontrol thi shift.    Assess patient for safety, offer medications as prescribed, engage patient in 1:1 staff talks.   Patient able to contract for safety, continue to monitor as planned.

## 2022-12-14 NOTE — Group Note (Signed)
Recreation Therapy Group Note   Group Topic:Problem Solving  Group Date: 12/14/2022 Start Time: 0930 End Time: 1000 Facilitators: Elleanor Guyett-McCall, LRT,CTRS Location: [address] Dayroom   Goal Area(s) Addresses:  Patient will effectively work with peer towards shared goal.  Patient will identify skills used to make activity successful.  Patient will share challenges and verbalize solution-driven approaches used. Patient will identify how skills used during activity can be used to reach post d/c goals.    Group Description:  [employer]. Patients were provided the following materials: 4 drinking straws, 5 rubber bands, 5 paper clips, 2 index cards, 2 drinking cups, and 2 toilet paper rolls. Using the provided materials patients were asked to build a launching mechanism to launch a ping pong ball across the room, approximately 10 feet. Patients were divided into teams of 3-5. Instructions required all materials be incorporated into the device, functionality of items left to the peer group's discretion.   Affect/Mood: N/A   Participation Level: Did not attend    Clinical Observations/Individualized Feedback:     Plan: Continue to engage patient in RT group sessions 2-3x/week.   Cleston Lautner-McCall, LRT,CTRS 12/14/2022 12:48 PM

## 2022-12-14 NOTE — Group Note (Unsigned)
Date:  12/14/2022 Time:  11:38 AM  Group Topic/Focus:  Orientation:   The focus of this group is to educate the patient on the purpose and policies of crisis stabilization and provide a format to answer questions about their admission.  The group details unit policies and expectations of patients while admitted.     Participation Level:  {BHH PARTICIPATION HD:996081  Participation Quality:  {BHH PARTICIPATION QUALITY:22265}  Affect:  {BHH AFFECT:22266}  Cognitive:  {BHH COGNITIVE:22267}  Insight: {BHH Insight2:20797}  Engagement in Group:  {BHH ENGAGEMENT IN JY:[id number]  Modes of Intervention:  {BHH MODES OF INTERVENTION:22269}  Additional Comments:  ***  Garvin Fila 12/14/2022, 11:38 AM

## 2022-12-14 NOTE — Progress Notes (Signed)
   12/14/22 0200  Psych Admission Type (Psych Patients Only)  Admission Status Involuntary  Psychosocial Assessment  Patient Complaints Anxiety  Eye Contact Fair  Facial Expression Anxious  Affect Appropriate to circumstance  Speech Logical/coherent  Interaction Assertive  Motor Activity Slow  Appearance/Hygiene Unremarkable  Behavior Characteristics Cooperative  Mood Anxious  Thought Process  Coherency WDL  Content WDL  Delusions None reported or observed  Perception WDL  Hallucination None reported or observed  Judgment Impaired  Confusion None  Danger to Self  Current suicidal ideation? Denies  Self-Injurious Behavior No self-injurious ideation or behavior indicators observed or expressed   Agreement Not to Harm Self Yes  Description of Agreement verbally contracts for safety  Danger to Others  Danger to Others None reported or observed   Alert/oriented. Patient heard yelling from room. Upon observation patient observed in bed having moderate/substantial  tremors of lower extremities. Patient stated having withdrawals from Xanax. Prn agitation med given with minimal effectiveness. [place]  NP informed of patient status, she replied to continue agitation protocol. PRN Geodon given. Patient went to sleep after administration of med.  Denies SI/HI. Will encourage continue compliance and progression towards goals. Verbally contracted for safety. Will continue to monitor.

## 2022-12-15 MED ORDER — OLANZAPINE 5 MG PO TABS
5.0000 mg | ORAL_TABLET | Freq: Every day | ORAL | Status: DC
  Administered 2022-12-15: 5 mg via ORAL
  Filled 2022-12-15 (×2): qty 1

## 2022-12-15 MED ORDER — TRAZODONE HCL 50 MG PO TABS
50.0000 mg | ORAL_TABLET | Freq: Every day | ORAL | Status: DC
  Administered 2022-12-15 – 2022-12-19 (×5): 50 mg via ORAL
  Filled 2022-12-15 (×7): qty 1

## 2022-12-15 NOTE — Progress Notes (Signed)
D- Patient alert and oriented. Endorses AH, stating that voices are saying "just stuff." Denies SI, HI, VH, and pain. Reports being anxious all day. Anxiety rated 10/10 and depression 6/10.  A- Scheduled medications administered to patient, per MAR. Support and encouragement provided.  Routine safety checks conducted every 15 minutes.  Patient informed to notify staff with problems or concerns.  R- No adverse drug reactions noted. Patient contracts for safety at this time. Patient compliant with medications and treatment plan. Patient receptive, calm, and cooperative. Patient interacts well with others on the unit.  Patient remains safe at this time.

## 2022-12-15 NOTE — Plan of Care (Signed)
  Problem: Education: Goal: Emotional status will improve Outcome: Progressing Goal: Mental status will improve Outcome: Progressing   Problem: Coping: Goal: Ability to demonstrate self-control will improve Outcome: Progressing   Problem: Physical Regulation: Goal: Ability to maintain clinical measurements within normal limits will improve Outcome: Progressing

## 2022-12-15 NOTE — Progress Notes (Signed)
Pt refused both vital signs and breakfast.

## 2022-12-15 NOTE — BHH Group Notes (Signed)
[place] Group Notes:  (Nursing/MHT/Case Management/Adjunct)  Date:  12/15/2022  Time:  9:43 PM  Type of Therapy:  Group Therapy  Participation Level:  Active  Participation Quality:  Appropriate  Affect:  Appropriate  Cognitive:  Appropriate  Insight:  Appropriate  Engagement in Group:  Engaged  Modes of Intervention:  Education  Summary of Progress/Problems:  Pt attended group, day was 7/10. Goal was to listen more and socialize. Goal met. Jesse Jones 12/15/2022, 9:43 PM

## 2022-12-15 NOTE — Progress Notes (Signed)
   12/15/22 0548  15 Minute Checks  Location Bedroom  Visual Appearance Calm  Behavior Sleeping  Sleep (Behavioral Health Patients Only)  Calculate sleep? (Click Yes once per 24 hr at 0600 safety check) Yes  Documented sleep last 24 hours 11.5

## 2022-12-15 NOTE — Progress Notes (Signed)
   12/15/22 2250  Psych Admission Type (Psych Patients Only)  Admission Status Involuntary  Psychosocial Assessment  Patient Complaints Anxiety  Eye Contact Fair  Facial Expression Anxious  Affect Appropriate to circumstance  Speech Logical/coherent  Interaction Assertive  Motor Activity Fidgety  Appearance/Hygiene Unremarkable  Behavior Characteristics Appropriate to situation  Mood Anxious;Pleasant  Thought Process  Coherency WDL  Content WDL  Delusions None reported or observed  Perception Hallucinations  Hallucination Auditory  Judgment Impaired  Confusion None  Danger to Self  Current suicidal ideation? Denies  Self-Injurious Behavior No self-injurious ideation or behavior indicators observed or expressed   Agreement Not to Harm Self Yes  Description of Agreement verbal  Danger to Others  Danger to Others None reported or observed

## 2022-12-15 NOTE — BHH Group Notes (Signed)
.  Psychoeducational Group Note    Date:  12/15/2022 Time:1300-1400    Purpose of Group: . The group focus' on teaching patients on how to identify their needs and their Life Skills:  A group where two lists are made. What people need and what are things that we do that are unhealthy. The lists are developed by the patients and it is explained that we often do the actions that are not healthy to get our list of needs met.  Goal:: to develop the coping skills needed to get their needs met  Participation Level:  Active  Participation Quality:  Appropriate  Affect:  Appropriate  Cognitive:  Oriented  Insight: Improving  Engagement in Group:  Engaged  Modes of Intervention:  Activity, Discussion, Education, and Support  Additional Comments:  Pt rates energy at a 6/10. Engaged in discussions  Jesse Jones

## 2022-12-15 NOTE — Group Note (Signed)
LCSW Group Therapy Note  Group Date: 12/15/2022 Start Time: [id number] End Time: 1115   Type of Therapy and Topic:  Group Therapy: Anger Cues and Responses  Participation Level:  Active   Description of Group:   In this group, patients learned how to recognize the physical, cognitive, emotional, and behavioral responses they have to anger-provoking situations.  They identified a recent time they became angry and how they reacted.  They analyzed how their reaction was possibly beneficial and how it was possibly unhelpful.  The group discussed a variety of healthier coping skills that could help with such a situation in the future.  Focus was placed on how helpful it is to recognize the underlying emotions to our anger, because working on those can lead to a more permanent solution as well as our ability to focus on the important rather than the urgent.  Therapeutic Goals: Patients will remember their last incident of anger and how they felt emotionally and physically, what their thoughts were at the time, and how they behaved. Patients will identify how their behavior at that time worked for them, as well as how it worked against them. Patients will explore possible new behaviors to use in future anger situations. Patients will learn that anger itself is normal and cannot be eliminated, and that healthier reactions can assist with resolving conflict rather than worsening situations.  Summary of Patient Progress:  Jesse Jones was active but frequently intrusive and disruptive during the group.  He initially stated it is very hard to get him angry and this does not happen very often, but then he said he has road rage which will make him drive more aggressively.  He kept interrupting other speakers, making side comments under his breath or to peers sitting close by.  More than once he had to be asked to stop.   He demonstrated little insight into the subject matter, was respectful of peers, and participated  throughout the entire session.  Therapeutic Modalities:   Cognitive Behavioral Therapy    Jesse Jones 12/15/2022  4:15 PM

## 2022-12-15 NOTE — BHH Group Notes (Signed)
Goals Group 12/15/22   Group Focus: affirmation, clarity of thought, and goals/reality orientation Treatment Modality:  Psychoeducation Interventions utilized were assignment, group exercise, and support Purpose: To be able to understand and verbalize the reason for their admission to the hospital. To understand that the medication helps with their chemical imbalance but they also need to work on their choices in life. To be challenged to develop a list of 30 positives about themselves. Also introduce the concept that "feelings" are not reality.  Participation Level:  Active  Participation Quality:  Appropriate  Affect:  Appropriate  Cognitive:  Appropriate  Insight:  Improving  Engagement in Group:  Engaged  Additional Comments:  Rates energy at a 7/10. Participated fully in group.  Jesse Jones

## 2022-12-15 NOTE — Progress Notes (Signed)
[facility] MD Progress Note  12/15/2022 1:23 PM HANNA COMINS  MRN:  [id number] Principal Problem: Severe bipolar I disorder, current or most recent episode depressed ([place]) Diagnosis: Principal Problem:   Severe bipolar I disorder, current or most recent episode depressed ([place]) Active Problems:   ADHD (attention deficit hyperactivity disorder)   GAD (generalized anxiety disorder)   Benzodiazepine dependence ([place])   Insomnia  Reason for admission: Ciro Avallone is a 42 yo Caucasian male with past mental health history of ADHD, alcohol use disorder in sustained remission & GAD who presented to the [facility] on 02/08 after intentionally taking 12 mg of Xanax in a suicide attempt.  Patient is a poor historian, and initially stated he did not know why he overdosed, but then stated it was because his parents got into an argument over his medications.  Patient was IVC'd and transferred to this behavioral health Hospital for treatment and stabilization of his mental status.  24 hr chart review:  Staff reports that the patient is a little bit more cooperative although this morning he refuses vital signs because he is very sleepy.  He is sleeping somewhat excessively and reports 11.5 hours of sleep.  Apparently yesterday he received 1 as needed Zyprexa as 630 1 in the AM and no other PRNs were given.  He had Naprosyn.  His pulse remains high at 116.  Blood pressure is stable.  Patient assessment note, 2/17/024:  The patient was seen and evaluated.  He was lying in bed somewhat sedated but was easily arousable.  He maintained good eye contact.  His speech is coherent without any obvious looseness of associations flight of ideas or tangentiality.  Patient reports no auditory or visual hallucinations and denies any delusions.  He is able to contract for safety.  He denies SI/HI/AVH.  Overall, he reports that he was feeling better and was progressively improving and was thankful for being started back on  the clonazepam.  He is not taking 0.5 mg twice a day.  No side effects are noted and he seems less agitated and less irritable.  His depression is improving.  He does however seem more sedated at night.  He feels hallucinations are improved the plan is to gradually taper the Zyprexa.  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   Outbursts of anger    Preventative health care 10/03/2011   Tobacco abuse 04/28/2011    Past Surgical History:  Procedure Laterality Date   NASAL SEPTUM SURGERY Bilateral    Dr Redmond Baseman 2017   punctured lung     toe surgeries  during childhood    for ingrown toenails   Family History:  Family History  Problem Relation Age of Onset   Depression Mother    Anxiety disorder Mother    Depression Brother    Diabetes Brother        type 1   Cancer Paternal Grandmother        lung/  didn't smoke   Cancer Maternal Uncle 12       colon cancer   Family Psychiatric  History: See above Social History:  Social History   Substance and Sexual Activity  Alcohol Use Yes   Alcohol/week: 36.0 standard drinks of alcohol   Types: 36 Cans of beer per week   Comment: sober x75yr No alcohol.     Social History   Substance and Sexual Activity  Drug Use Yes   Types: Marijuana, Other-see comments   Comment: pt stopped 4 year ago    Social History   Socioeconomic History   Marital status: Single    Spouse name: Not on file   Number of children: 0   Years of education: 12   Highest education  level: 12th grade  Occupational History   Not on file  Tobacco Use   Smoking status: Former    Packs/day: 0.30    Years: 20.00    Total pack years: 6.00    Types: Cigarettes    Passive exposure: Current   Smokeless tobacco: Never   Tobacco comments:    Discussed Smoking Cessation and Alcohol and Drug Services  Vaping Use   Vaping Use: Never used  Substance and Sexual Activity   Alcohol use: Yes    Alcohol/week: 36.0 standard drinks of alcohol    Types: 36 Cans of beer per week    Comment: sober x447yrNo alcohol.   Drug use: Yes    Types: Marijuana, Other-see comments    Comment: pt stopped 4 year ago   Sexual activity: Yes    Partners: Female    Comment: lives alone and eating well. exercising  Other Topics Concern   Not on file  Social History Narrative   Not on file   Social Determinants of Health   Financial Resource Strain: Low Risk  (03/02/2022)   Overall Financial Resource Strain (CARDIA)    Difficulty of Paying Living Expenses: Not hard at all  Food Insecurity: No Food Insecurity (12/08/2022)   Hunger Vital Sign    Worried About Running Out of Food in the Last Year: Never true    Ran Out of Food in the Last Year: Never true  Transportation Needs: No Transportation Needs (12/08/2022)   PRAPARE - TrHydrologistMedical): No    Lack of Transportation (Non-Medical): No  Physical Activity: Inactive (03/02/2022)   Exercise Vital Sign    Days of Exercise per Week: 0 days    Minutes of Exercise per Session: 0 min  Stress: Stress Concern Present (03/02/2022)   [place]  Feeling of Stress : Very much  Social Connections: Socially Isolated (03/02/2022)   Social Connection and Isolation Panel [NHANES]    Frequency of Communication with Friends and Family: More than three times a week    Frequency of Social Gatherings with Friends and Family: More than three times a week    Attends  Religious Services: Never    AcMarine scientistr Organizations: No    Attends ClMusic therapistNever    Marital Status: Never married   sleep: Good  Appetite:  Poor  Current Medications: Current Facility-Administered Medications  Medication Dose Route Frequency Provider Last Rate Last Admin   alum & mag hydroxide-simeth (MAALOX/MYLANTA) 200-200-20 MG/5ML suspension 30 mL  30 mL Oral Q4H PRN Motley-Mangrum, Jadeka A, PMHNP   30 mL at 12/13/22 1407   atomoxetine (STRATTERA)  capsule 20 mg  20 mg Oral BID WC Ranae Palms, MD   20 mg at 12/15/22 0925   clonazePAM (KLONOPIN) tablet 0.5 mg  0.5 mg Oral BID Ranae Palms, MD   0.5 mg at 12/15/22 0837   dicyclomine (BENTYL) tablet 20 mg  20 mg Oral Q6H PRN Onuoha, Chinwendu V, NP       escitalopram (LEXAPRO) tablet 20 mg  20 mg Oral Daily Minda Ditto, RPH   20 mg at 12/15/22 [id number]   feeding supplement (ENSURE ENLIVE / ENSURE PLUS) liquid 237 mL  237 mL Oral TID BM Nkwenti, Doris, NP   237 mL at 12/15/22 1309   fluticasone (FLONASE) 50 MCG/ACT nasal spray 2 spray  2 spray Each Nare Daily PRN Motley-Mangrum, Jadeka A, PMHNP       gabapentin (NEURONTIN) capsule 300 mg  300 mg Oral TID Nicholes Rough, NP   300 mg at 12/15/22 1309   loperamide (IMODIUM) capsule 2-4 mg  2-4 mg Oral PRN Onuoha, Chinwendu V, NP       magnesium hydroxide (MILK OF MAGNESIA) suspension 30 mL  30 mL Oral Daily PRN Motley-Mangrum, Jadeka A, PMHNP       multivitamin with minerals tablet 1 tablet  1 tablet Oral q morning Motley-Mangrum, Jadeka A, PMHNP   1 tablet at 12/14/22 0838   naproxen (NAPROSYN) tablet 500 mg  500 mg Oral BID PRN Onuoha, Chinwendu V, NP   500 mg at 12/14/22 1818   OLANZapine (ZYPREXA) tablet 5 mg  5 mg Oral QHS Anders Hohmann, MD       OLANZapine zydis (ZYPREXA) disintegrating tablet 5 mg  5 mg Oral Q8H PRN Motley-Mangrum, Jadeka A, PMHNP   5 mg at 12/14/22 0631   omega-3 acid ethyl esters (LOVAZA) capsule 2 g  2 g Oral Daily  Motley-Mangrum, Jadeka A, PMHNP   2 g at 12/15/22 0927   ondansetron (ZOFRAN-ODT) disintegrating tablet 4 mg  4 mg Oral Q6H PRN Onuoha, Chinwendu V, NP       thiamine (Vitamin B-1) tablet 100 mg  100 mg Oral Daily Leevy-Johnson, Brooke A, NP   100 mg at 12/15/22 [id number]    Lab Results:  No results found for this or any previous visit (from the past 48 hour(s)).    Blood Alcohol level:  Lab Results  Component Value Date   ETH <11 02/10/2013   ETH (H) 12/25/2008    181        LOWEST DETECTABLE LIMIT FOR SERUM ALCOHOL IS 5 mg/dL FOR MEDICAL PURPOSES ONLY    Metabolic Disorder Labs: Lab Results  Component Value Date   HGBA1C 4.6 (L) 12/09/2022   MPG 85.32 12/09/2022   MPG 97 06/23/2020   No results found for: "PROLACTIN" Lab Results  Component Value Date   CHOL 191 12/09/2022   TRIG 42 12/09/2022   HDL 60 12/09/2022   CHOLHDL 3.2 12/09/2022   VLDL 8 12/09/2022   LDLCALC 123 (H) 12/09/2022   LDLCALC 109 (H) 01/31/2022    Physical Findings: AIMS:  0 CIWA:  CIWA-Ar Total: 1 COWS:  COWS Total Score: 2  Musculoskeletal: Strength & Muscle Tone: within normal limits Gait & Station: normal Patient leans: N/A  Psychiatric Specialty Exam:  Presentation  General Appearance:  Appropriate for Environment  Eye Contact: Fair  Speech: Clear and Coherent  Speech Volume: Decreased  Handedness: Right   Mood and Affect  Mood: Anxious  Affect: Constricted   Thought Process  Thought Processes: Coherent  Descriptions of Associations:Intact  Orientation:Full (Time, Place and Person)  Thought Content:Logical  History of Schizophrenia/Schizoaffective disorder:No  Duration of Psychotic Symptoms:No data recorded Hallucinations:Hallucinations: None  Ideas of Reference:None  Suicidal Thoughts:Suicidal Thoughts: No  Homicidal Thoughts:Homicidal Thoughts: No   Sensorium  Memory: Immediate Fair; Remote Fair; Recent  Fair  Judgment: Fair  Insight: Fair   Community education officer  Concentration: Fair  Attention Span: Fair  Recall: [employer] of Knowledge: Fair  Language: Fair   Psychomotor Activity  Psychomotor Activity: Psychomotor Activity: Normal   Assets  Assets: Desire for Improvement; Communication Skills   Sleep  Sleep: Sleep: Good    Physical Exam: Physical Exam Vitals and nursing note reviewed.  Constitutional:      Appearance: Normal appearance.  Eyes:     Pupils: Pupils are equal, round, and reactive to light.  Musculoskeletal:        General: Normal range of motion.     Cervical back: Normal range of motion.  Neurological:     Mental Status: He is oriented to person, place, and time.    Review of Systems  Constitutional: Negative.   HENT: Negative.    Eyes: Negative.   Respiratory: Negative.    Cardiovascular: Negative.   Gastrointestinal: Negative.   Genitourinary: Negative.   Musculoskeletal: Negative.   Skin: Negative.   Neurological: Negative.  Negative for dizziness.  Psychiatric/Behavioral:  Negative for hallucinations, memory loss and suicidal ideas. The patient is nervous/anxious.    Blood pressure 118/86, pulse (!) 116, temperature (!) 97.4 F (36.3 C), temperature source Oral, resp. rate 20, height 6' 2"$  (1.88 m), weight 86.2 kg, SpO2 98 %. Body mass index is 24.39 kg/m.  Treatment Plan Summary: Daily contact with patient to assess and evaluate symptoms and progress in treatment and Medication management  Observation Level/Precautions:  15 minute checks  Laboratory:  Labs reviewed: Qtc on EKG slightly prolonged at 464. Repeat EKG ordered forl trending while on antipsychotics.   Psychotherapy:  Unit Group sessions  Medications:  See [facility]  Consultations:  To be determined   Discharge Concerns:  Safety, medication compliance, mood stability  Estimated LOS: 5-7 days  Other:  N/A   Labs independently reviewed on 12/12/2022. Qtc on EKG  slightly prolonged at 464. Repeat EKG ordered for trending while on antipsychotics with QTC 403. Ordered baseline UA which is WNL.   PLAN Safety and Monitoring: Voluntary admission to inpatient psychiatric unit for safety, stabilization and treatment Daily contact with patient to assess and evaluate symptoms and progress in treatment Patient's case to be discussed in multi-disciplinary team meeting Observation Level : q15 minute checks Vital signs: q12 hours Precautions: Safety  Long Term Goal(s): Improvement in symptoms so as ready for discharge  Short Term Goals: Ability to identify changes in lifestyle to reduce recurrence of condition will improve, Ability to disclose and discuss suicidal ideas, Ability to demonstrate self-control will improve, Ability to identify and develop effective coping behaviors will improve, Ability to maintain clinical measurements within normal limits will improve, Compliance with prescribed medications will improve, and Ability to identify triggers associated with substance abuse/mental health issues will improve  Diagnoses Principal Problem:   Severe bipolar I disorder, current or most recent episode depressed ([place]) Active Problems:   ADHD (attention deficit hyperactivity disorder)   GAD (generalized anxiety disorder)   Benzodiazepine dependence ([facility])   Insomnia  Medications -Tapered Adderall 30 mg off as pt had withdrawal symptoms r/t abrupt cessation-Give 15 mg on 2/13 followed by 10 mg on  2/14, then 5 mg on 2/15, then stop.  -Increase Gabapentin to 300 mg TID for anxiety  -Increase Strattera to 20 mg p.o. twice daily  -Continue Lexapro 20 mg for depressive symptoms -Taper Zyprexa 5 mg nightly for mood stabilization -Continue Ensures TID for nutritional support -Discontinued BuSpar on 12/16 -Continue hydroxyzine 25 mg as needed 3 times daily for anxiety -Discontinue trazodone 50 mg p.o. on 12/16 -Continue Lovaza 2 g daily for  hyperlipidemia Patient educated on rationales, benefits and possible side effects of all medications listed above, and agreeable with trials, and plan. Begin clonazepam 0.5 mg p.o. twice daily on 12/16  Other PRNS -Continue Tylenol 650 mg every 6 hours PRN for mild pain -Continue Maalox 30 mg every 4 hrs PRN for indigestion -Continue Imodium 2-4 mg as needed for diarrhea -Continue Milk of Magnesia as needed every 6 hrs for constipation -Continue Zofran disintegrating tabs every 6 hrs PRN for nausea   Discharge Planning: Social work and case management to assist with discharge planning and identification of hospital follow-up needs prior to discharge Estimated LOS: Possible discharge by Monday Discharge Concerns: Need to establish a safety plan; Medication compliance and effectiveness Discharge Goals: Return home with outpatient referrals for mental health follow-up including medication management/psychotherapy  I certify that inpatient services furnished can reasonably be expected to improve the patient's condition.    Ranae Palms, MD 12/15/2022, 1:23 PMPatient ID: [name], male   DOB: 1981-10-19, 42 y.o.   MRN: [id number] Total Time Spent in Direct Patient Care:  I personally spent 30 minutes on the unit in direct patient care. The direct patient care time included face-to-face time with the patient, reviewing the patient's chart, communicating with other professionals, and coordinating care. Greater than 50% of this time was spent in counseling or coordinating care with the patient regarding goals of hospitalization, psycho-education, and discharge planning needs.   Patient ID: JAKYRON SCHRANDT, male   DOB: [birth year], 42 y.o.   MRN: [id number]

## 2022-12-15 NOTE — Progress Notes (Addendum)
D. Pt has been calm and cooperative throughout most of the shift, observed attending groups today- compliant with medications. Per pt's self inventory, pt rated his depression,hopelessness and anxiety a 6/7/3, respectively. Pt's stated goal was to "be quiet and listen." Pt currently denies SI/HI and AVH  A. Labs and vitals monitored. Pt given and educated on medications. Pt received prn zyprexa once for complaints of agitation  Pt supported emotionally and encouraged to express concerns and ask questions.   R. Pt remains safe with 15 minute checks. Will continue POC.    12/15/22 1200  Psych Admission Type (Psych Patients Only)  Admission Status Involuntary  Psychosocial Assessment  Patient Complaints Anxiety  Eye Contact Fair  Facial Expression Anxious  Affect Blunted  Speech Logical/coherent  Interaction Assertive  Motor Activity Slow  Appearance/Hygiene Unremarkable  Behavior Characteristics Cooperative;Anxious  Mood Depressed;Anxious  Thought Process  Coherency WDL  Content WDL  Delusions None reported or observed  Perception Hallucinations  Hallucination Auditory  Judgment Limited  Confusion None  Danger to Self  Current suicidal ideation? Denies  Self-Injurious Behavior No self-injurious ideation or behavior indicators observed or expressed   Agreement Not to Harm Self Yes  Description of Agreement verbal contract for safety  Danger to Others  Danger to Others None reported or observed

## 2022-12-16 MED ORDER — GABAPENTIN 300 MG PO CAPS
300.0000 mg | ORAL_CAPSULE | Freq: Two times a day (BID) | ORAL | Status: DC
  Administered 2022-12-16 – 2022-12-20 (×8): 300 mg via ORAL
  Filled 2022-12-16 (×11): qty 1

## 2022-12-16 MED ORDER — ATOMOXETINE HCL 25 MG PO CAPS
25.0000 mg | ORAL_CAPSULE | Freq: Two times a day (BID) | ORAL | Status: DC
  Administered 2022-12-16 – 2022-12-20 (×8): 25 mg via ORAL
  Filled 2022-12-16 (×10): qty 1

## 2022-12-16 MED ORDER — CLONAZEPAM 0.5 MG PO TABS
0.5000 mg | ORAL_TABLET | Freq: Three times a day (TID) | ORAL | Status: DC
  Administered 2022-12-16 – 2022-12-20 (×12): 0.5 mg via ORAL
  Filled 2022-12-16 (×12): qty 1

## 2022-12-16 NOTE — [facility] Group Notes (Signed)
Adult Psychoeducational Group  Date:  12/16/2022 Time: 1300-1400  Group Topic/Focus: Continuation of the group from Saturday. Looking at the lists that were created and talking about what needs to be done with the homework of 30 positives about themselves.                                     Talking about taking their power back and helping themselves to develop a positive self esteem.      Participation Quality:  did not attend  Jesse Jones

## 2022-12-16 NOTE — Progress Notes (Signed)
[facility] MD Progress Note  12/16/2022 11:25 AM Jesse Jones  MRN:  [id number] Principal Problem: Severe bipolar I disorder, current or most recent episode depressed ([place]) Diagnosis: Principal Problem:   Severe bipolar I disorder, current or most recent episode depressed ([place]) Active Problems:   ADHD (attention deficit hyperactivity disorder)   GAD (generalized anxiety disorder)   Benzodiazepine dependence ([place])   Insomnia  Reason for admission: Jesse Jones is a 42 yo Caucasian male with past mental health history of ADHD, alcohol use disorder in sustained remission & GAD who presented to the [facility] on 02/08 after intentionally taking 12 mg of Xanax in a suicide attempt.  Patient is a poor historian, and initially stated he did not know why he overdosed, but then stated it was because his parents got into an argument over his medications.  Patient was IVC'd and transferred to this behavioral health Hospital for treatment and stabilization of his mental status.  24 hr chart review:  Staff reports the patient is somewhat improved.  He is likable, cooperative and compliant with treatment.  He is sleeping fairly well.  Zyprexa is being tapered off.  He is currently taking 5 mg at night.  His blood pressure is stable.  He has been attending groups.  Patient assessment note, 2/18/024:  The patient was seen and evaluated.  He is alert oriented and cooperative.  He maintained good eye contact.  His speech is coherent without any obvious looseness of associations or flight of ideas or tangentiality.  He continues to contract for safety.  He is still focused on the Adderall but is happy to be back on the Klonopin.  He reports increased attention span and focus.  He is tolerating the Strattera and the Lexapro combination.  He denies any SI/HI/AVH.  Contact with the patient's parents and have discussed medications and his improvement.  They are quite reported. Plan is to discontinue Zyprexa.   Increase Klonopin 2.5 p.o. 3 times daily.Taper off the gabapentin.   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   Outbursts of anger    Preventative health care 10/03/2011   Tobacco abuse 04/28/2011    Past Surgical History:  Procedure Laterality Date   NASAL SEPTUM SURGERY Bilateral    Dr Redmond Baseman 2017   punctured lung     toe surgeries  during childhood    for ingrown toenails   Family History:  Family History  Problem Relation Age of Onset   Depression Mother    Anxiety disorder Mother    Depression Brother    Diabetes Brother        type 1   Cancer Paternal Grandmother        lung/ didn't smoke   Cancer Maternal Uncle 17       colon cancer   Family Psychiatric  History: See above Social History:  Social History   Substance and Sexual Activity  Alcohol Use Yes   Alcohol/week: 36.0 standard  drinks of alcohol   Types: 36 Cans of beer per week   Comment: sober x21yr No alcohol.     Social History   Substance and Sexual Activity  Drug Use Yes   Types: Marijuana, Other-see comments   Comment: pt stopped 4 year ago    Social History   Socioeconomic History   Marital status: Single    Spouse name: Not on file   Number of children: 0   Years of education: 12   Highest education level: 12th grade  Occupational History   Not on file  Tobacco Use   Smoking status: Former    Packs/day: 0.30    Years: 20.00    Total pack years: 6.00    Types: Cigarettes    Passive exposure: Current    Smokeless tobacco: Never   Tobacco comments:    Discussed Smoking Cessation and Alcohol and Drug Services  Vaping Use   Vaping Use: Never used  Substance and Sexual Activity   Alcohol use: Yes    Alcohol/week: 36.0 standard drinks of alcohol    Types: 36 Cans of beer per week    Comment: sober x445yrNo alcohol.   Drug use: Yes    Types: Marijuana, Other-see comments    Comment: pt stopped 4 year ago   Sexual activity: Yes    Partners: Female    Comment: lives alone and eating well. exercising  Other Topics Concern   Not on file  Social History Narrative   Not on file   Social Determinants of Health   Financial Resource Strain: Low Risk  (03/02/2022)   Overall Financial Resource Strain (CARDIA)    Difficulty of Paying Living Expenses: Not hard at all  Food Insecurity: No Food Insecurity (12/08/2022)   Hunger Vital Sign    Worried About Running Out of Food in the Last Year: Never true    Ran Out of Food in the Last Year: Never true  Transportation Needs: No Transportation Needs (12/08/2022)   PRAPARE - TrHydrologistMedical): No    Lack of Transportation (Non-Medical): No  Physical Activity: Inactive (03/02/2022)   Exercise Vital Sign    Days of Exercise per Week: 0 days    Minutes of Exercise per Session: 0 min  Stress: Stress Concern Present (03/02/2022)   FiBoyle  Feeling of Stress : Very much  Social Connections: Socially Isolated (03/02/2022)   Social Connection and Isolation Panel [NHANES]    Frequency of Communication with Friends and Family: More than three times a week    Frequency of Social Gatherings with Friends and Family: More than three times a week    Attends Religious Services: Never    AcMarine scientistr Organizations: No    Attends ClMusic therapistNever    Marital Status: Never married   sleep: Good  Appetite:  Poor  Current  Medications: Current Facility-Administered Medications  Medication Dose Route Frequency Provider Last Rate Last Admin   alum & mag hydroxide-simeth (MAALOX/MYLANTA) 200-200-20 MG/5ML suspension 30 mL  30 mL Oral Q4H PRN Motley-Mangrum, Jadeka A, PMHNP   30 mL at 12/13/22 1407   atomoxetine (STRATTERA) capsule 25 mg  25 mg Oral BID WC Shahad Mazurek, MD       clonazePAM (KLONOPIN) tablet 0.5 mg  0.5 mg Oral TID GoRanae PalmsMD       dicyclomine (BENTYL) tablet 20 mg  20  mg Oral Q6H PRN Onuoha, Chinwendu V, NP       escitalopram (LEXAPRO) tablet 20 mg  20 mg Oral Daily Minda Ditto, RPH   20 mg at 12/16/22 [id number]   feeding supplement (ENSURE ENLIVE / ENSURE PLUS) liquid 237 mL  237 mL Oral TID BM Nkwenti, Doris, NP   237 mL at 12/15/22 2106   fluticasone (FLONASE) 50 MCG/ACT nasal spray 2 spray  2 spray Each Nare Daily PRN Motley-Mangrum, Jadeka A, PMHNP       gabapentin (NEURONTIN) capsule 300 mg  300 mg Oral BID Ranae Palms, MD       loperamide (IMODIUM) capsule 2-4 mg  2-4 mg Oral PRN Onuoha, Chinwendu V, NP       magnesium hydroxide (MILK OF MAGNESIA) suspension 30 mL  30 mL Oral Daily PRN Motley-Mangrum, Jadeka A, PMHNP       multivitamin with minerals tablet 1 tablet  1 tablet Oral q morning Motley-Mangrum, Jadeka A, PMHNP   1 tablet at 12/16/22 0835   naproxen (NAPROSYN) tablet 500 mg  500 mg Oral BID PRN Onuoha, Chinwendu V, NP   500 mg at 12/14/22 1818   OLANZapine zydis (ZYPREXA) disintegrating tablet 5 mg  5 mg Oral Q8H PRN Motley-Mangrum, Jadeka A, PMHNP   5 mg at 12/15/22 1457   omega-3 acid ethyl esters (LOVAZA) capsule 2 g  2 g Oral Daily Motley-Mangrum, Jadeka A, PMHNP   2 g at 12/16/22 0737   ondansetron (ZOFRAN-ODT) disintegrating tablet 4 mg  4 mg Oral Q6H PRN Onuoha, Chinwendu V, NP       thiamine (Vitamin B-1) tablet 100 mg  100 mg Oral Daily Leevy-Johnson, Brooke A, NP   100 mg at 12/16/22 0737   traZODone (DESYREL) tablet 50 mg  50 mg Oral QHS Bobbitt, Shalon E, NP    50 mg at 12/15/22 2127    Lab Results:  No results found for this or any previous visit (from the past 48 hour(s)).    Blood Alcohol level:  Lab Results  Component Value Date   ETH <11 02/10/2013   ETH (H) 12/25/2008    181        LOWEST DETECTABLE LIMIT FOR SERUM ALCOHOL IS 5 mg/dL FOR MEDICAL PURPOSES ONLY    Metabolic Disorder Labs: Lab Results  Component Value Date   HGBA1C 4.6 (L) 12/09/2022   MPG 85.32 12/09/2022   MPG 97 06/23/2020   No results found for: "PROLACTIN" Lab Results  Component Value Date   CHOL 191 12/09/2022   TRIG 42 12/09/2022   HDL 60 12/09/2022   CHOLHDL 3.2 12/09/2022   VLDL 8 12/09/2022   LDLCALC 123 (H) 12/09/2022   LDLCALC 109 (H) 01/31/2022    Physical Findings: AIMS:  0 CIWA:  CIWA-Ar Total: 1 COWS:  COWS Total Score: 1  Musculoskeletal: Strength & Muscle Tone: within normal limits Gait & Station: normal Patient leans: N/A  Psychiatric Specialty Exam:  Presentation  General Appearance:  Appropriate for Environment  Eye Contact: Fair  Speech: Clear and Coherent  Speech Volume: Decreased  Handedness: Right   Mood and Affect  Mood: Anxious  Affect: Constricted   Thought Process  Thought Processes: Goal Directed  Descriptions of Associations:Intact  Orientation:Full (Time, Place and Person)  Thought Content:Rumination; Perseveration  History of Schizophrenia/Schizoaffective disorder:No  Duration of Psychotic Symptoms:No data recorded Hallucinations:Hallucinations: None  Ideas of Reference:None  Suicidal Thoughts:Suicidal Thoughts: No  Homicidal Thoughts:Homicidal Thoughts: No   Sensorium  Memory: Immediate Fair;  Remote Fair; Recent Fair  Judgment: Fair  Insight: Fair   Materials engineer: Good  Attention Span: Good  Recall: Roel Cluck of Knowledge: Fair  Language: Fair   Psychomotor Activity  Psychomotor Activity: Psychomotor Activity:  Normal   Assets  Assets: Desire for Improvement; Communication Skills; Social Support; Housing   Sleep  Sleep: Sleep: Fair    Physical Exam: Physical Exam Vitals and nursing note reviewed.  Constitutional:      Appearance: Normal appearance.  Eyes:     Pupils: Pupils are equal, round, and reactive to light.  Musculoskeletal:        General: Normal range of motion.     Cervical back: Normal range of motion.  Neurological:     Mental Status: He is oriented to person, place, and time.    Review of Systems  Constitutional: Negative.   HENT: Negative.    Eyes: Negative.   Respiratory: Negative.    Cardiovascular: Negative.   Gastrointestinal: Negative.   Genitourinary: Negative.   Musculoskeletal: Negative.   Skin: Negative.   Neurological: Negative.  Negative for dizziness.  Psychiatric/Behavioral:  Negative for hallucinations, memory loss and suicidal ideas. The patient is nervous/anxious.    Blood pressure (!) 122/90, pulse (!) 113, temperature (!) 97.5 F (36.4 C), temperature source Oral, resp. rate 20, height 6' 2"$  (1.88 m), weight 86.2 kg, SpO2 98 %. Body mass index is 24.39 kg/m.  Treatment Plan Summary: Daily contact with patient to assess and evaluate symptoms and progress in treatment and Medication management  Observation Level/Precautions:  15 minute checks  Laboratory:  Labs reviewed: Qtc on EKG slightly prolonged at 464. Repeat EKG ordered forl trending while on antipsychotics.   Psychotherapy:  Unit Group sessions  Medications:  [facility]  Consultations:  To be determined   Discharge Concerns:  Safety, medication compliance, mood stability  Estimated LOS: 5-7 days  Other:  N/A   Labs independently reviewed on 12/12/2022. Qtc on EKG slightly prolonged at 464. Repeat EKG ordered for trending while on antipsychotics with QTC 403. Ordered baseline UA which is WNL.   PLAN Safety and Monitoring: Voluntary admission to inpatient psychiatric unit for  safety, stabilization and treatment Daily contact with patient to assess and evaluate symptoms and progress in treatment Patient's case to be discussed in multi-disciplinary team meeting Observation Level : q15 minute checks Vital signs: q12 hours Precautions: Safety  Long Term Goal(s): Improvement in symptoms so as ready for discharge  Short Term Goals: Ability to identify changes in lifestyle to reduce recurrence of condition will improve, Ability to disclose and discuss suicidal ideas, Ability to demonstrate self-control will improve, Ability to identify and develop effective coping behaviors will improve, Ability to maintain clinical measurements within normal limits will improve, Compliance with prescribed medications will improve, and Ability to identify triggers associated with substance abuse/mental health issues will improve  Diagnoses Principal Problem:   Severe bipolar I disorder, current or most recent episode depressed ([place]) Active Problems:   ADHD (attention deficit hyperactivity disorder)   GAD (generalized anxiety disorder)   Benzodiazepine dependence ([place])   Insomnia  Medications -Tapered Adderall 30 mg off as pt had withdrawal symptoms r/t abrupt cessation-Give 15 mg on 2/13 followed by 10 mg on 2/14, then 5 mg on 2/15, then stop.  -Decrease gabapentin to 300 mg twice daily for anxiety  -Increase Strattera to 25 mg p.o. twice daily  -Continue Lexapro 20 mg for depressive symptoms -Taper Zyprexa 5 mg nightly for mood stabilization -  Continue Ensures TID for nutritional support -Discontinued BuSpar on 12/16 -Continue hydroxyzine 25 mg as needed 3 times daily for anxiety -Discontinue trazodone 50 mg p.o. on 12/16 -Continue Lovaza 2 g daily for hyperlipidemia Patient educated on rationales, benefits and possible side effects of all medications listed above, and agreeable with trials, and plan. Increase clonazepam 0.5 mg p.o. 3 times a day on 12/18.  Other  PRNS -Continue Tylenol 650 mg every 6 hours PRN for mild pain -Continue Maalox 30 mg every 4 hrs PRN for indigestion -Continue Imodium 2-4 mg as needed for diarrhea -Continue Milk of Magnesia as needed every 6 hrs for constipation -Continue Zofran disintegrating tabs every 6 hrs PRN for nausea   Discharge Planning: Social work and case management to assist with discharge planning and identification of hospital follow-up needs prior to discharge Estimated LOS: Patient is not ready for discharge on Monday and will require until Wednesday or Thursday for improved stability. Discharge Concerns: Need to establish a safety plan; Medication compliance and effectiveness Discharge Goals: Return home with outpatient referrals for mental health follow-up including medication management/psychotherapy  I certify that inpatient services furnished can reasonably be expected to improve the patient's condition.    Ranae Palms, MD 12/16/2022, 11:25 AMPatient ID: Jesse Jones, male   DOB: [birth year], 42 y.o.   MRN: [id number] Total Time Spent in Direct Patient Care:  I personally spent 30 minutes on the unit in direct patient care. The direct patient care time included face-to-face time with the patient, reviewing the patient's chart, communicating with other professionals, and coordinating care. Greater than 50% of this time was spent in counseling or coordinating care with the patient regarding goals of hospitalization, psycho-education, and discharge planning needs.   Patient ID: Jesse Jones, male   DOB: 06-06-1981, 42 y.o.   MRN: [id number] Patient ID: Jesse Jones, male   DOB: Jan 26, 1981, 42 y.o.   MRN: [id number]

## 2022-12-16 NOTE — Progress Notes (Signed)
Pt reports that his sleep is better and that he isn't having as much difficulty sleeping due to his tremors. He does report intermittent shakiness in his legs. He also endorses having cravings for benzos. He said that his goal is to "be quiet and listen." Encouraged pt to work on coping skills to refrain from substance use. Pt has been present in the milieu and has been participating in groups. Pt denies SI/HI and AVH. Active listening, reassurance, and support provided. Q 15 min safety checks continue. Pt's safety has been maintained.   12/16/22 1955  Psych Admission Type (Psych Patients Only)  Admission Status Involuntary  Psychosocial Assessment  Patient Complaints Anxiety;Shakiness;Substance abuse  Eye Contact Fair  Facial Expression Anxious  Affect Anxious;Appropriate to circumstance  Speech Logical/coherent  Interaction Assertive  Motor Activity Fidgety  Appearance/Hygiene Unremarkable  Behavior Characteristics Cooperative;Appropriate to situation;Anxious  Mood Anxious;Pleasant  Thought Process  Coherency WDL  Content WDL  Delusions None reported or observed  Perception WDL  Hallucination None reported or observed  Judgment Impaired  Confusion None  Danger to Self  Current suicidal ideation? Denies  Self-Injurious Behavior No self-injurious ideation or behavior indicators observed or expressed   Agreement Not to Harm Self Yes  Description of Agreement verbally contracts for safety  Danger to Others  Danger to Others None reported or observed

## 2022-12-16 NOTE — Progress Notes (Signed)
[facility] Group Notes:  (Nursing/MHT/Case Management/Adjunct)  Date:  12/16/2022  Time:  2000  Type of Therapy:   wrap up group  Participation Level:  Active  Participation Quality:  Appropriate, Attentive, Sharing, and Supportive  Affect:  Appropriate  Cognitive:  Alert  Insight:  Improving  Engagement in Group:  Engaged  Modes of Intervention:  Clarification, Education, and Support  Summary of Progress/Problems: Positive thinking, self-care, and positive change were discussed.  Shellia Cleverly 12/16/2022, 10:38 PM

## 2022-12-16 NOTE — Progress Notes (Addendum)
D. Pt presented brighter this am, reported that he slept well last night, and ate well for breakfast. Pt observed attending group this am.  Pt currently denies SI/HI and AVH and agrees to contact staff before acting on any harmful thoughts.  A. Labs and vitals monitored. Pt given and educated on medications. Pt supported emotionally and encouraged to express concerns and ask questions.   R. Pt remains safe with 15 minute checks. Will continue POC.    12/16/22 1100  Psych Admission Type (Psych Patients Only)  Admission Status Involuntary  Psychosocial Assessment  Patient Complaints Anxiety  Eye Contact Fair  Facial Expression Anxious  Affect Appropriate to circumstance  Speech Logical/coherent  Interaction Assertive  Motor Activity Slow  Appearance/Hygiene Unremarkable  Behavior Characteristics Cooperative;Appropriate to situation  Mood Anxious  Thought Process  Coherency WDL  Content WDL  Delusions None reported or observed  Perception Hallucinations  Hallucination None reported or observed  Judgment Impaired  Confusion None  Danger to Self  Current suicidal ideation? Denies  Self-Injurious Behavior No self-injurious ideation or behavior indicators observed or expressed   Agreement Not to Harm Self Yes  Description of Agreement verbal contract for safety  Danger to Others  Danger to Others None reported or observed

## 2022-12-16 NOTE — [facility] Group Notes (Signed)
Adult Psychoeducational Group Note Date:  12/16/2022 Time:  0900-1000 Group Topic/Focus: PROGRESSIVE RELAXATION. A group where deep breathing is taught and tensing and relaxation muscle groups is used. Imagery is used as well.  Pts are asked to imagine 3 pillars that hold them up when they are not able to hold themselves up and to share that with the group.   Participation Level:  Active  Participation Quality:  Appropriate  Affect:  Appropriate  Cognitive:  Approprate  Insight: Improving  Engagement in Group:  Engaged  Modes of Intervention:  deep breathing, Imagery. Discussion  Additional Comments:  Rates his energy at a 6/10. States God, prayer, and his friends hold him up.   : Paulino Rily

## 2022-12-16 NOTE — Group Note (Signed)
Date:  12/16/2022 Time:  5:03 PM  Group Topic/Focus:  Goals Group:   The focus of this group is to help patients establish daily goals to achieve during treatment and discuss how the patient can incorporate goal setting into their daily lives to aide in recovery.    Participation Level:  Did Not Attend   Participation Quality:   Affect:      Cognitive:      Insight: None  Engagement in Group:      Modes of Intervention:      Additional Comments:     Jerrye Beavers 12/16/2022, 5:03 PM

## 2022-12-17 NOTE — Progress Notes (Addendum)
D. Pt has been appropriate on the unit- observed attending group. Per pt's self inventory, pt rated his depression,hopelessness and anxiety a 5/3/3, respectively. Pt's stated goal today was "to go to group and listen and not sleep."Pt currently denies SI/HI and AVH and agrees to contact staff before acting on any harmful thoughts.  A. Labs and vitals monitored. Pt given and educated on medications. Pt supported emotionally and encouraged to express concerns and ask questions.   R. Pt remains safe with 15 minute checks. Will continue POC.    12/17/22 1300  Psych Admission Type (Psych Patients Only)  Admission Status Involuntary  Psychosocial Assessment  Patient Complaints Anxiety  Eye Contact Fair  Facial Expression Anxious  Affect Appropriate to circumstance  Speech Logical/coherent  Interaction Assertive  Motor Activity Other (Comment) (steady gait)  Appearance/Hygiene Unremarkable  Behavior Characteristics Cooperative;Appropriate to situation  Mood Anxious  Thought Process  Coherency WDL  Content WDL  Delusions None reported or observed  Perception WDL  Hallucination None reported or observed  Judgment Impaired  Confusion None  Danger to Self  Current suicidal ideation? Denies  Self-Injurious Behavior No self-injurious ideation or behavior indicators observed or expressed   Agreement Not to Harm Self Yes  Description of Agreement verbal contract  Danger to Others  Danger to Others None reported or observed

## 2022-12-17 NOTE — Progress Notes (Signed)
   12/17/22 0600  15 Minute Checks  Location Bedroom  Visual Appearance Calm  Behavior Sleeping  Sleep (Behavioral Health Patients Only)  Calculate sleep? (Click Yes once per 24 hr at 0600 safety check) Yes  Documented sleep last 24 hours 8.5

## 2022-12-17 NOTE — [facility] Group Notes (Signed)
Spiritual care group on grief and loss facilitated by Chaplain Janne Napoleon, Bcc and Lysle Morales, counseling intern.  Group Goal: Support / Education around grief and loss  Members engage in facilitated group support and psycho-social education.  Group Description:  Following introductions and group rules, group members engaged in facilitated group dialogue and support around topic of loss, with particular support around experiences of loss in their lives. Group Identified types of loss (relationships / self / things) and identified patterns, circumstances, and changes that precipitate losses. Reflected on thoughts / feelings around loss, normalized grief responses, and recognized variety in grief experience. Group encouraged individual reflection on safe space and on the coping skills that they are already utilizing.  Group drew on Adlerian / Rogerian and narrative framework  Patient Progress: Did not attend.

## 2022-12-17 NOTE — Group Note (Signed)
Recreation Therapy Group Note   Group Topic:Stress Management  Group Date: 12/17/2022 Start Time: 0940 End Time: 1014 Facilitators: Neythan Kozlov-McCall, LRT,CTRS Location: [address] Dayroom   Goal Area(s) Addresses:  Patient will actively participate in stress management techniques presented during session.  Patient will successfully identify benefit of practicing stress management post d/c.   Group Description: Guided Imagery. LRT provided education, instruction, and demonstration on practice of visualization via guided imagery. Patient was asked to participate in the technique introduced during session. LRT debriefed including topics of mindfulness, stress management and specific scenarios each patient could use these techniques. Patients were given suggestions of ways to access scripts post d/c and encouraged to explore Youtube and other apps available on smartphones, tablets, and computers.   Affect/Mood: N/A   Participation Level: Did not attend    Clinical Observations/Individualized Feedback:     Plan: Continue to engage patient in RT group sessions 2-3x/week.   Aarika Moon-McCall, LRT,CTRS 12/17/2022 1:09 PM

## 2022-12-17 NOTE — [facility] Group Notes (Signed)
[facility] Group Notes:  (Nursing/MHT/Case Management/Adjunct)  Date:  12/17/2022  Time:  8:13 PM  Type of Therapy:   AA meeting  Participation Level:  Active  Participation Quality:  Appropriate  Affect:  Appropriate  Cognitive:  Appropriate  Insight:  Appropriate  Engagement in Group:  Engaged  Modes of Intervention:  Support  Summary of Progress/Problems:  Pt attended [place] meeting.  Orvan Falconer 12/17/2022, 8:13 PM

## 2022-12-18 DIAGNOSIS — F314 Bipolar disorder, current episode depressed, severe, without psychotic features: Secondary | ICD-10-CM | POA: Diagnosis not present

## 2022-12-18 NOTE — Progress Notes (Addendum)
[facility] MD Progress Note  12/18/2022 1:19 PM [name]  MRN:  [id number] Principal Problem: Severe bipolar I disorder, current or most recent episode depressed ([place]) Diagnosis: Principal Problem:   Severe bipolar I disorder, current or most recent episode depressed ([place]) Active Problems:   ADHD (attention deficit hyperactivity disorder)   GAD (generalized anxiety disorder)   Benzodiazepine dependence ([place])   Insomnia  Reason for admission: Jesse Jones is a 42 yo Caucasian male with past mental health history of ADHD, alcohol use disorder in sustained remission & GAD who presented to the [facility] on 02/08 after intentionally taking 12 mg of Xanax in a suicide attempt.  Patient is a poor historian, and initially stated he did not know why he overdosed, but then stated it was because his parents got into an argument over his medications.  Patient was IVC'd and transferred to this behavioral health Hospital for treatment and stabilization of his mental status.  24 hr chart review:  Staff reports the patient is somewhat improved.  He is likable, cooperative and compliant with treatment.  He is sleeping fairly well.  Zyprexa is being tapered off.  He is currently taking 5 mg at night.  His blood pressure is stable.  He has been attending groups.  Patient assessment note Upon evaluation today patient reports feeling good in general he does report some tremors but during exam he does not seem to have any, he is currently on Klonopin 0.5 mg 3 times daily to help with withdrawals from benzodiazepine.  He denies depressed mood or anxiety he denies SI HI or AVH he reports good sleep and appetite and denies side effect to current medication regimen he reports he lives alone but mother and father are very supportive and they live very nearby.  He has an appointment for outpatient follow-up on 2/23.  Discussed with patient discharge in the next couple of days and he agrees to comply with outpatient  follow-up for psychiatric care.   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   Depression    ED (erectile dysfunction) 05/12/2012   Hyperlipidemia, mild 04/18/2015   Insomnia    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   Outbursts of anger    Preventative health care 10/03/2011   Tobacco abuse 04/28/2011    Past Surgical History:  Procedure Laterality Date   NASAL SEPTUM SURGERY Bilateral    Dr Redmond Baseman 2017   punctured lung     toe surgeries  during childhood    for ingrown toenails   Family History:  Family History  Problem Relation Age of Onset   Depression Mother    Anxiety disorder Mother    Depression Brother    Diabetes Brother        type 1   Cancer Paternal Grandmother        lung/ didn't smoke   Cancer Maternal Uncle 50       colon cancer   Family Psychiatric  History: See above Social History:  Social History   Substance and Sexual Activity  Alcohol Use Yes   Alcohol/week: 36.0 standard drinks of alcohol  Types: 36 Cans of beer per week   Comment: sober x81yr No alcohol.     Social History   Substance and Sexual Activity  Drug Use Yes   Types: Marijuana, Other-see comments   Comment: pt stopped 4 year ago    Social History   Socioeconomic History   Marital status: Single    Spouse name: Not on file   Number of children: 0   Years of education: 12   Highest education level: 12th grade  Occupational History   Not on file  Tobacco Use   Smoking status: Former    Packs/day: 0.30    Years: 20.00    Total pack years: 6.00    Types: Cigarettes    Passive exposure: Current   Smokeless tobacco: Never   Tobacco  comments:    Discussed Smoking Cessation and Alcohol and Drug Services  Vaping Use   Vaping Use: Never used  Substance and Sexual Activity   Alcohol use: Yes    Alcohol/week: 36.0 standard drinks of alcohol    Types: 36 Cans of beer per week    Comment: sober x452yrNo alcohol.   Drug use: Yes    Types: Marijuana, Other-see comments    Comment: pt stopped 4 year ago   Sexual activity: Yes    Partners: Female    Comment: lives alone and eating well. exercising  Other Topics Concern   Not on file  Social History Narrative   Not on file   Social Determinants of Health   Financial Resource Strain: Low Risk  (03/02/2022)   Overall Financial Resource Strain (CARDIA)    Difficulty of Paying Living Expenses: Not hard at all  Food Insecurity: No Food Insecurity (12/08/2022)   Hunger Vital Sign    Worried About Running Out of Food in the Last Year: Never true    Ran Out of Food in the Last Year: Never true  Transportation Needs: No Transportation Needs (12/08/2022)   PRAPARE - TrHydrologistMedical): No    Lack of Transportation (Non-Medical): No  Physical Activity: Inactive (03/02/2022)   Exercise Vital Sign    Days of Exercise per Week: 0 days    Minutes of Exercise per Session: 0 min  Stress: Stress Concern Present (03/02/2022)   [place]  Feeling of Stress : Very much  Social Connections: Socially Isolated (03/02/2022)   Social Connection and Isolation Panel [NHANES]    Frequency of Communication with Friends and Family: More than three times a week    Frequency of Social Gatherings with Friends and Family: More than three times a week    Attends Religious Services: Never    AcMarine scientistr Organizations: No    Attends ClMusic therapistNever    Marital Status: Never married   sleep: Good  Appetite:  Poor  Current Medications: Current Facility-Administered  Medications  Medication Dose Route Frequency Provider Last Rate Last Admin   alum & mag hydroxide-simeth (MAALOX/MYLANTA) 200-200-20 MG/5ML suspension 30 mL  30 mL Oral Q4H PRN Motley-Mangrum, Jadeka A, PMHNP   30 mL at 12/13/22 1407   atomoxetine (STRATTERA) capsule 25 mg  25 mg Oral BID WC GoRanae PalmsMD   25 mg at 12/18/22 0805   clonazePAM (KLONOPIN) tablet 0.5 mg  0.5 mg Oral TID GoRanae PalmsMD   0.5 mg at 12/18/22 1145   dicyclomine (BENTYL) tablet 20 mg  20 mg Oral Q6H PRN Onuoha, Chinwendu V, NP       escitalopram (LEXAPRO) tablet 20 mg  20 mg Oral Daily Minda Ditto, RPH   20 mg at 12/18/22 0804   feeding supplement (ENSURE ENLIVE / ENSURE PLUS) liquid 237 mL  237 mL Oral TID BM Nkwenti, Doris, NP   237 mL at 12/16/22 2109   fluticasone (FLONASE) 50 MCG/ACT nasal spray 2 spray  2 spray Each Nare Daily PRN Motley-Mangrum, Jadeka A, PMHNP       gabapentin (NEURONTIN) capsule 300 mg  300 mg Oral BID Ranae Palms, MD   300 mg at 12/18/22 [id number]   loperamide (IMODIUM) capsule 2-4 mg  2-4 mg Oral PRN Onuoha, Chinwendu V, NP       magnesium hydroxide (MILK OF MAGNESIA) suspension 30 mL  30 mL Oral Daily PRN Motley-Mangrum, Jadeka A, PMHNP       multivitamin with minerals tablet 1 tablet  1 tablet Oral q morning Motley-Mangrum, Jadeka A, PMHNP   1 tablet at 12/18/22 0949   naproxen (NAPROSYN) tablet 500 mg  500 mg Oral BID PRN Onuoha, Chinwendu V, NP   500 mg at 12/14/22 1818   OLANZapine zydis (ZYPREXA) disintegrating tablet 5 mg  5 mg Oral Q8H PRN Motley-Mangrum, Jadeka A, PMHNP   5 mg at 12/15/22 1457   omega-3 acid ethyl esters (LOVAZA) capsule 2 g  2 g Oral Daily Motley-Mangrum, Jadeka A, PMHNP   2 g at 12/18/22 0804   ondansetron (ZOFRAN-ODT) disintegrating tablet 4 mg  4 mg Oral Q6H PRN Onuoha, Chinwendu V, NP       thiamine (Vitamin B-1) tablet 100 mg  100 mg Oral Daily Leevy-Johnson, Brooke A, NP   100 mg at 12/18/22 0804   traZODone (DESYREL) tablet 50 mg  50 mg Oral QHS  Bobbitt, Shalon E, NP   50 mg at 12/17/22 2116    Lab Results:  No results found for this or any previous visit (from the past 48 hour(s)).    Blood Alcohol level:  Lab Results  Component Value Date   ETH <11 02/10/2013   ETH (H) 12/25/2008    181        LOWEST DETECTABLE LIMIT FOR SERUM ALCOHOL IS 5 mg/dL FOR MEDICAL PURPOSES ONLY    Metabolic Disorder Labs: Lab Results  Component Value Date   HGBA1C 4.6 (L) 12/09/2022   MPG 85.32 12/09/2022   MPG 97 06/23/2020   No results found for: "PROLACTIN" Lab Results  Component Value Date   CHOL 191 12/09/2022   TRIG 42 12/09/2022   HDL 60 12/09/2022   CHOLHDL 3.2 12/09/2022   VLDL 8 12/09/2022   LDLCALC 123 (H) 12/09/2022   LDLCALC 109 (H) 01/31/2022    Physical Findings: AIMS:  0 CIWA:  CIWA-Ar Total: 1 COWS:  COWS Total Score: 1  Musculoskeletal: Strength & Muscle Tone: within normal limits Gait & Station: normal Patient leans: N/A  Psychiatric Specialty Exam:  Presentation  General Appearance:  Appropriate for Environment  Eye Contact: Fair  Speech: Clear and Coherent  Speech Volume: Decreased  Handedness: Right   Mood and Affect  Mood: Anxious  Affect: Constricted   Thought Process  Thought Processes: Goal Directed  Descriptions of Associations:Intact  Orientation:Full (Time, Place and Person)  Thought Content:Rumination; Perseveration  History of Schizophrenia/Schizoaffective disorder:No  Duration of Psychotic Symptoms:No data recorded Hallucinations:No data recorded  Ideas of Reference:None  Suicidal Thoughts:No data recorded  Homicidal Thoughts:No data recorded  Sensorium  Memory: Immediate Fair; Remote Fair; Recent Fair  Judgment: Fair  Insight: Fair   Executive Functions  Concentration: Good  Attention Span: Good  Recall: Roel Cluck of Knowledge: Fair  Language: Fair   Psychomotor Activity  Psychomotor Activity: No data  recorded   Assets  Assets: Desire for Improvement; Communication Skills; Social Support; Housing   Sleep  Sleep: No data recorded    Physical Exam: Physical Exam Vitals and nursing note reviewed.  Constitutional:      Appearance: Normal appearance.  Eyes:     Pupils: Pupils are equal, round, and reactive to light.  Musculoskeletal:        General: Normal range of motion.     Cervical back: Normal range of motion.  Neurological:     Mental Status: He is oriented to person, place, and time.    Review of Systems  Constitutional: Negative.   HENT: Negative.    Eyes: Negative.   Respiratory: Negative.    Cardiovascular: Negative.   Gastrointestinal: Negative.   Genitourinary: Negative.   Musculoskeletal: Negative.   Skin: Negative.   Neurological: Negative.  Negative for dizziness.  Psychiatric/Behavioral:  Negative for hallucinations, memory loss and suicidal ideas. The patient is nervous/anxious.    Blood pressure (!) 115/59, pulse (!) 101, temperature (!) 97.5 F (36.4 C), temperature source Oral, resp. rate 18, height 6' 2"$  (1.88 m), weight 86.2 kg, SpO2 100 %. Body mass index is 24.39 kg/m.  Treatment Plan Summary: Daily contact with patient to assess and evaluate symptoms and progress in treatment and Medication management  Observation Level/Precautions:  15 minute checks  Laboratory:  Labs reviewed: Qtc on EKG slightly prolonged at 464. Repeat EKG ordered forl trending while on antipsychotics.   Psychotherapy:  Unit Group sessions  Medications:  See [facility]  Consultations:  To be determined   Discharge Concerns:  Safety, medication compliance, mood stability  Estimated LOS: 5-7 days  Other:  N/A   Labs independently reviewed on 12/12/2022. Qtc on EKG slightly prolonged at 464. Repeat EKG ordered for trending while on antipsychotics with QTC 403. Ordered baseline UA which is WNL.   PLAN Safety and Monitoring: Voluntary admission to inpatient psychiatric unit  for safety, stabilization and treatment Daily contact with patient to assess and evaluate symptoms and progress in treatment Patient's case to be discussed in multi-disciplinary team meeting Observation Level : q15 minute checks Vital signs: q12 hours Precautions: Safety  Long Term Goal(s): Improvement in symptoms so as ready for discharge  Short Term Goals: Ability to identify changes in lifestyle to reduce recurrence of condition will improve, Ability to disclose and discuss suicidal ideas, Ability to demonstrate self-control will improve, Ability to identify and develop effective coping behaviors will improve, Ability to maintain clinical measurements within normal limits will improve, Compliance with prescribed medications will improve, and Ability to identify triggers associated with substance abuse/mental health issues will improve  Diagnoses Principal Problem:   Severe bipolar I disorder, current or most recent episode depressed ([place]) Active Problems:   ADHD (attention deficit hyperactivity disorder)   GAD (generalized anxiety disorder)   Benzodiazepine dependence ([facility])   Insomnia  Medications Continue off Adderall  -Continue gabapentin 300 mg twice daily for anxiety  -Continue Strattera to 25 mg p.o. twice daily  -Continue Lexapro 20 mg for depressive symptoms Continue Klonopin 0.5 mg 3 times daily tapering down from long-term treatment of benzodiazepine to avoid withdrawals, given history of worsening withdrawal during faster taper down will continue with this  dose at time of discharge with recommendation to taper down further gradually on outpatient basis. -Continue Ensures TID for nutritional support -Discontinued BuSpar on 12/16 -Continue hydroxyzine 25 mg as needed 3 times daily for anxiety -Continue Lovaza 2 g daily for hyperlipidemia Patient educated on rationales, benefits and possible side effects of all medications listed above, and agreeable with trials, and  plan.   Other PRNS -Continue Tylenol 650 mg every 6 hours PRN for mild pain -Continue Maalox 30 mg every 4 hrs PRN for indigestion -Continue Imodium 2-4 mg as needed for diarrhea -Continue Milk of Magnesia as needed every 6 hrs for constipation -Continue Zofran disintegrating tabs every 6 hrs PRN for nausea   Discharge Planning: Social work and case management to assist with discharge planning and identification of hospital follow-up needs prior to discharge Estimated LOS: Patient is not ready for discharge on Monday and will require until Wednesday or Thursday for improved stability. Discharge Concerns: Need to establish a safety plan; Medication compliance and effectiveness Discharge Goals: Return home with outpatient referrals for mental health follow-up including medication management/psychotherapy  I certify that inpatient services furnished can reasonably be expected to improve the patient's condition.    Dian Situ, MD 12/18/2022, 1:19 PMPatient ID: [name], male   DOB: 01-12-1981, 42 y.o.   MRN: [id number] Total Time Spent in Direct Patient Care:  I personally spent 30 minutes on the unit in direct patient care. The direct patient care time included face-to-face time with the patient, reviewing the patient's chart, communicating with other professionals, and coordinating care. Greater than 50% of this time was spent in counseling or coordinating care with the patient regarding goals of hospitalization, psycho-education, and discharge planning needs.   Patient ID: COMMIE FENDLEY, male   DOB: 1981/07/31, 42 y.o.   MRN: [id number] Patient ID: BRAYAN KENNARD, male   DOB: Nov 07, 1980, 42 y.o.   MRN: [id number]

## 2022-12-18 NOTE — Progress Notes (Signed)
[facility] MD Progress Note  12/18/2022 6:40 PM Jesse Jones  MRN:  [id number] Principal Problem: Severe bipolar I disorder, current or most recent episode depressed ([place]) Diagnosis: Principal Problem:   Severe bipolar I disorder, current or most recent episode depressed ([place]) Active Problems:   ADHD (attention deficit hyperactivity disorder)   GAD (generalized anxiety disorder)   Benzodiazepine dependence ([place])   Insomnia  Reason for admission: Jesse Jones is a 42 yo Caucasian male with past mental health history of ADHD, alcohol use disorder in sustained remission & GAD who presented to the [facility] on 02/08 after intentionally taking 12 mg of Xanax in a suicide attempt.  Patient is a poor historian, and initially stated he did not know why he overdosed, but then stated it was because his parents got into an argument over his medications.  Patient was IVC'd and transferred to this behavioral health Hospital for treatment and stabilization of his mental status.  24 hr chart review:  Staff reports the patient  is a lot more cooperative. Sleep is improved and he reports no anxiety.He has been attending groups.  Patient assessment note, 2/18/024:  The patient was seen and evaluated. He is alert, Ox3 and cooperative. Speech is wnl. He denied any SI/HI/AVH and is contracting for safety. Still wants Adderall, but is willing to try Strattera.Cognitively intact.  Contact with the patient's parents and have discussed medications and his improvement.They are comfortable with the trial of Klonopin. Strattera and Lexapro have been used for treatment resistant depression in addition to strattera being effective for ADHD symptoms. Parents are very supportive  He is off Zyprexa.   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   Depression    ED (erectile dysfunction) 05/12/2012   Hyperlipidemia, mild 04/18/2015   Insomnia    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   Outbursts of anger    Preventative health care 10/03/2011   Tobacco abuse 04/28/2011    Past Surgical History:  Procedure Laterality Date   NASAL SEPTUM SURGERY Bilateral    Dr Redmond Baseman 2017   punctured lung     toe surgeries  during childhood    for ingrown toenails   Family History:  Family History  Problem Relation Age of Onset   Depression Mother    Anxiety disorder Mother    Depression Brother    Diabetes Brother        type 1   Cancer Paternal Grandmother        lung/ didn't smoke   Cancer Maternal Uncle 47       colon cancer   Family Psychiatric  History: See above Social History:  Social History   Substance and Sexual Activity  Alcohol Use Yes   Alcohol/week: 36.0 standard drinks of alcohol   Types: 36 Cans of beer per week   Comment: sober x28yr No alcohol.     Social History   Substance and Sexual Activity  Drug Use Yes   Types: Marijuana, Other-see comments   Comment: pt stopped 4 year ago    Social History   Socioeconomic History   Marital status:  Single    Spouse name: Not on file   Number of children: 0   Years of education: 38   Highest education level: 12th grade  Occupational History   Not on file  Tobacco Use   Smoking status: Former    Packs/day: 0.30    Years: 20.00    Total pack years: 6.00    Types: Cigarettes    Passive exposure: Current   Smokeless tobacco: Never   Tobacco comments:    Discussed Smoking Cessation and Alcohol and Drug Services  Vaping Use   Vaping Use: Never used  Substance and Sexual Activity   Alcohol use: Yes    Alcohol/week: 36.0 standard drinks of alcohol    Types: 36 Cans of beer per week    Comment:  sober x74yr No alcohol.   Drug use: Yes    Types: Marijuana, Other-see comments    Comment: pt stopped 4 year ago   Sexual activity: Yes    Partners: Female    Comment: lives alone and eating well. exercising  Other Topics Concern   Not on file  Social History Narrative   Not on file   Social Determinants of Health   Financial Resource Strain: Low Risk  (03/02/2022)   Overall Financial Resource Strain (CARDIA)    Difficulty of Paying Living Expenses: Not hard at all  Food Insecurity: No Food Insecurity (12/08/2022)   Hunger Vital Sign    Worried About Running Out of Food in the Last Year: Never true    Ran Out of Food in the Last Year: Never true  Transportation Needs: No Transportation Needs (12/08/2022)   PRAPARE - THydrologist(Medical): No    Lack of Transportation (Non-Medical): No  Physical Activity: Inactive (03/02/2022)   Exercise Vital Sign    Days of Exercise per Week: 0 days    Minutes of Exercise per Session: 0 min  Stress: Stress Concern Present (03/02/2022)   [employer]   Feeling of Stress : Very much  Social Connections: Socially Isolated (03/02/2022)   Social Connection and Isolation Panel [NHANES]    Frequency of Communication with Friends and Family: More than three times a week    Frequency of Social Gatherings with Friends and Family: More than three times a week    Attends Religious Services: Never    AMarine scientistor Organizations: No    Attends CMusic therapist Never    Marital Status: Never married   sleep: Good  Appetite:  Poor  Current Medications: Current Facility-Administered Medications  Medication Dose Route Frequency Provider Last Rate Last Admin   alum & mag hydroxide-simeth (MAALOX/MYLANTA) 200-200-20 MG/5ML suspension 30 mL  30 mL Oral Q4H PRN Motley-Mangrum, Jadeka A, PMHNP   30 mL at 12/13/22 1407   atomoxetine (STRATTERA)  capsule 25 mg  25 mg Oral BID WC GRanae Palms MD   25 mg at 12/18/22 1653   clonazePAM (KLONOPIN) tablet 0.5 mg  0.5 mg Oral TID GRanae Palms MD   0.5 mg at 12/18/22 1653   dicyclomine (BENTYL) tablet 20 mg  20 mg Oral Q6H PRN Onuoha, Chinwendu V, NP       escitalopram (LEXAPRO) tablet 20 mg  20 mg Oral Daily Green, Terri L, RPH   20 mg at 12/18/22 0804   feeding supplement (ENSURE ENLIVE / ENSURE PLUS) liquid 237 mL  237 mL Oral TID BM NNicholes Rough NP  237 mL at 12/16/22 2109   fluticasone (FLONASE) 50 MCG/ACT nasal spray 2 spray  2 spray Each Nare Daily PRN Motley-Mangrum, Donneta Romberg A, PMHNP       gabapentin (NEURONTIN) capsule 300 mg  300 mg Oral BID Ranae Palms, MD   300 mg at 12/18/22 1653   loperamide (IMODIUM) capsule 2-4 mg  2-4 mg Oral PRN Onuoha, Chinwendu V, NP       magnesium hydroxide (MILK OF MAGNESIA) suspension 30 mL  30 mL Oral Daily PRN Motley-Mangrum, Jadeka A, PMHNP       multivitamin with minerals tablet 1 tablet  1 tablet Oral q morning Motley-Mangrum, Jadeka A, PMHNP   1 tablet at 12/18/22 0949   naproxen (NAPROSYN) tablet 500 mg  500 mg Oral BID PRN Onuoha, Chinwendu V, NP   500 mg at 12/14/22 1818   OLANZapine zydis (ZYPREXA) disintegrating tablet 5 mg  5 mg Oral Q8H PRN Motley-Mangrum, Jadeka A, PMHNP   5 mg at 12/15/22 1457   omega-3 acid ethyl esters (LOVAZA) capsule 2 g  2 g Oral Daily Motley-Mangrum, Jadeka A, PMHNP   2 g at 12/18/22 0804   ondansetron (ZOFRAN-ODT) disintegrating tablet 4 mg  4 mg Oral Q6H PRN Onuoha, Chinwendu V, NP       thiamine (Vitamin B-1) tablet 100 mg  100 mg Oral Daily Leevy-Johnson, Brooke A, NP   100 mg at 12/18/22 0804   traZODone (DESYREL) tablet 50 mg  50 mg Oral QHS Bobbitt, Shalon E, NP   50 mg at 12/17/22 2116    Lab Results:  No results found for this or any previous visit (from the past 48 hour(s)).    Blood Alcohol level:  Lab Results  Component Value Date   ETH <11 02/10/2013   ETH (H) 12/25/2008    181         LOWEST DETECTABLE LIMIT FOR SERUM ALCOHOL IS 5 mg/dL FOR MEDICAL PURPOSES ONLY    Metabolic Disorder Labs: Lab Results  Component Value Date   HGBA1C 4.6 (L) 12/09/2022   MPG 85.32 12/09/2022   MPG 97 06/23/2020   No results found for: "PROLACTIN" Lab Results  Component Value Date   CHOL 191 12/09/2022   TRIG 42 12/09/2022   HDL 60 12/09/2022   CHOLHDL 3.2 12/09/2022   VLDL 8 12/09/2022   LDLCALC 123 (H) 12/09/2022   LDLCALC 109 (H) 01/31/2022    Physical Findings: AIMS:  0 CIWA:  CIWA-Ar Total: 1 COWS:  COWS Total Score: 1  Musculoskeletal: Strength & Muscle Tone: within normal limits Gait & Station: normal Patient leans: N/A  Psychiatric Specialty Exam:  Presentation  General Appearance:  Appropriate for Environment  Eye Contact: Fair  Speech: Clear and Coherent  Speech Volume: Decreased  Handedness: Right   Mood and Affect  Mood: Anxious  Affect: Restricted   Thought Process  Thought Processes: Goal Directed  Descriptions of Associations:Intact  Orientation:Full (Time, Place and Person)  Thought Content:Logical  History of Schizophrenia/Schizoaffective disorder:No  Duration of Psychotic Symptoms:No data recorded Hallucinations:Hallucinations: Auditory Description of Auditory Hallucinations: Vague voices " in my head"  Ideas of Reference:None  Suicidal Thoughts:Suicidal Thoughts: No  Homicidal Thoughts:Homicidal Thoughts: No   Sensorium  Memory: Recent Fair; Remote Fair; Immediate Fair  Judgment: Fair  Insight: Fair   Materials engineer: Fair  Attention Span: Fair  Recall: [employer] of Knowledge: Fair  Language: Fair   Psychomotor Activity  Psychomotor Activity: Psychomotor Activity: Normal   Assets  Assets: Armed forces logistics/support/administrative officer; Housing   Sleep  Sleep: Sleep: Fair    Physical Exam: Physical Exam Vitals and nursing note reviewed.  Constitutional:      Appearance:  Normal appearance.  Eyes:     Pupils: Pupils are equal, round, and reactive to light.  Musculoskeletal:        General: Normal range of motion.     Cervical back: Normal range of motion.  Neurological:     Mental Status: He is oriented to person, place, and time.    Review of Systems  Constitutional: Negative.   HENT: Negative.    Eyes: Negative.   Respiratory: Negative.    Cardiovascular: Negative.   Gastrointestinal: Negative.   Genitourinary: Negative.   Musculoskeletal: Negative.   Skin: Negative.   Neurological: Negative.  Negative for dizziness.  Psychiatric/Behavioral:  Negative for hallucinations, memory loss and suicidal ideas. The patient is nervous/anxious.    Blood pressure 112/86, pulse (!) 108, temperature (!) 97.5 F (36.4 C), temperature source Oral, resp. rate 18, height 6' 2"$  (1.88 m), weight 86.2 kg, SpO2 100 %. Body mass index is 24.39 kg/m.  Treatment Plan Summary: Daily contact with patient to assess and evaluate symptoms and progress in treatment and Medication management  Observation Level/Precautions:  15 minute checks  Laboratory:  Labs reviewed: Qtc on EKG slightly prolonged at 464. Repeat EKG ordered forl trending while on antipsychotics.   Psychotherapy:  Unit Group sessions  Medications:  See [facility]  Consultations:  To be determined   Discharge Concerns:  Safety, medication compliance, mood stability  Estimated LOS: 5-7 days  Other:  N/A   Labs independently reviewed on 12/12/2022. Qtc on EKG slightly prolonged at 464. Repeat EKG ordered for trending while on antipsychotics with QTC 403. Ordered baseline UA which is WNL.   PLAN Safety and Monitoring: Voluntary admission to inpatient psychiatric unit for safety, stabilization and treatment Daily contact with patient to assess and evaluate symptoms and progress in treatment Patient's case to be discussed in multi-disciplinary team meeting Observation Level : q15 minute checks Vital signs: q12  hours Precautions: Safety  Long Term Goal(s): Improvement in symptoms so as ready for discharge  Short Term Goals: Ability to identify changes in lifestyle to reduce recurrence of condition will improve, Ability to disclose and discuss suicidal ideas, Ability to demonstrate self-control will improve, Ability to identify and develop effective coping behaviors will improve, Ability to maintain clinical measurements within normal limits will improve, Compliance with prescribed medications will improve, and Ability to identify triggers associated with substance abuse/mental health issues will improve  Diagnoses Principal Problem:   Severe bipolar I disorder, current or most recent episode depressed ([place]) Active Problems:   ADHD (attention deficit hyperactivity disorder)   GAD (generalized anxiety disorder)   Benzodiazepine dependence ([place])   Insomnia  Medications -Tapered Adderall 30 mg off as pt had withdrawal symptoms r/t abrupt cessation-Give 15 mg on 2/13 followed by 10 mg on 2/14, then 5 mg on 2/15, then stop.  -Decrease gabapentin to 300 mg twice daily for anxiety and continue with taper.  -Increase Strattera to 25 mg p.o. twice daily  -Continue Lexapro 20 mg for depressive symptoms -DC Zyprexa 5 mg nightly. -Continue Ensures TID for nutritional support -Discontinued BuSpar on 12/16 -Continue hydroxyzine 25 mg as needed 3 times daily for anxiety -Discontinue trazodone 50 mg p.o. on 12/16 -Continue Lovaza 2 g daily for hyperlipidemia Patient educated on rationales, benefits and possible side effects of all medications listed above, and agreeable  with trials, and plan. Increase clonazepam 0.5 mg p.o. 3 times a day on 12/18.  Other PRNS -Continue Tylenol 650 mg every 6 hours PRN for mild pain -Continue Maalox 30 mg every 4 hrs PRN for indigestion -Continue Imodium 2-4 mg as needed for diarrhea -Continue Milk of Magnesia as needed every 6 hrs for constipation -Continue Zofran  disintegrating tabs every 6 hrs PRN for nausea   Discharge Planning: Social work and case management to assist with discharge planning and identification of hospital follow-up needs prior to discharge Estimated LOS: Patient is not ready for discharge on Monday and will require until Wednesday or Thursday for improved stability. Discharge Concerns: Need to establish a safety plan; Medication compliance and effectiveness Discharge Goals: Return home with outpatient referrals for mental health follow-up including medication management/psychotherapy  I certify that inpatient services furnished can reasonably be expected to improve the patient's condition.    Ranae Palms, MD 12/18/2022, 6:40 PMPatient ID: Jesse Jones, male   DOB: [birth year], 42 y.o.   MRN: [id number] Total Time Spent in Direct Patient Care:  I personally spent 30 minutes on the unit in direct patient care. The direct patient care time included face-to-face time with the patient, reviewing the patient's chart, communicating with other professionals, and coordinating care. Greater than 50% of this time was spent in counseling or coordinating care with the patient regarding goals of hospitalization, psycho-education, and discharge planning needs.   Patient ID: Jesse Jones, male   DOB: 1980/12/27, 42 y.o.   MRN: [id number] Patient ID: Jesse Jones, male   DOB: 22-Oct-1981, 42 y.o.

## 2022-12-18 NOTE — Group Note (Signed)
LCSW Group Therapy Note   Group Date: 12/18/2022 Start Time: 1100 End Time: 1200  Type of Group and Topic: Psychoeducational Group: Discharge Planning  Participation Level: Did Not Attend  Description of Group Discharge planning group reviews patient's anticipated discharge plans and assists patients to anticipate and address any barriers to wellness/recovery in the community. Suicide prevention education is reviewed with patients in group. Patients will receive a worksheet of the 5W's (Who, What, When, Where, How).  The Pt will discuss supports, coping skills, and their motivations for change.   Therapeutic Goals  1. Patients will state their anticipated discharge plan and mental health aftercare  2. Patients will identify potential barriers to wellness in the community setting  3. Patients will engage in problem solving, solution focused discussion of ways to anticipate and address barriers to wellness/recovery  Summary of Patient Progress: Did not attend   Darleen Crocker, [place] 12/18/2022  1:23 PM

## 2022-12-18 NOTE — Group Note (Signed)
Recreation Therapy Group Note   Group Topic:Animal Assisted Therapy   Group Date: 12/18/2022 Start Time: 1430 End Time: 1500 Facilitators: Eljay Lave-McCall, LRT,CTRS Location: [address] Dayroom   Animal-Assisted Activity (AAA) Program Checklist/Progress Notes Patient Eligibility Criteria Checklist & Daily Group note for Rec Tx Intervention  AAA/T Program Assumption of Risk Form signed by Patient/ or Parent Legal Guardian Yes  Patient understands his/her participation is voluntary Yes   Affect/Mood: N/A   Participation Level: Did not attend    Clinical Observations/Individualized Feedback:     Plan: Continue to engage patient in RT group sessions 2-3x/week.   Jesse Jones, LRT,CTRS 12/18/2022 4:20 PM

## 2022-12-18 NOTE — Progress Notes (Signed)
D: Pt denied SI/HI/AVH this morning. Pt rated his depression a 3/10 and his anxiety a 4/10. Pt reports that he slept well last night night. Pt has been pleasant, calm, and cooperative throughout the shift.   A: RN provided support and encouragement to patient. Pt given scheduled medications as prescribed. Q15 min checks verified for safety.    R: Patient verbally contracts for safety. Patient compliant with medications and treatment plan. Pt is safe on the unit.   12/18/22 1100  Psych Admission Type (Psych Patients Only)  Admission Status Involuntary  Psychosocial Assessment  Patient Complaints Anxiety;Depression  Eye Contact Fair  Facial Expression Anxious  Affect Anxious  Speech Logical/coherent  Interaction Assertive  Motor Activity Fidgety  Appearance/Hygiene Unremarkable  Behavior Characteristics Cooperative;Anxious  Mood Anxious  Thought Process  Coherency WDL  Content WDL  Delusions None reported or observed  Perception WDL  Hallucination None reported or observed  Judgment Impaired  Confusion None  Danger to Self  Current suicidal ideation? Denies  Description of Suicide Plan No plan  Self-Injurious Behavior No self-injurious ideation or behavior indicators observed or expressed   Agreement Not to Harm Self Yes  Description of Agreement Pt verbally contracts for safety  Danger to Others  Danger to Others None reported or observed

## 2022-12-18 NOTE — Progress Notes (Signed)
Adult Psychoeducational Group Note  Date:  12/18/2022 Time:  10:18 PM  Group Topic/Focus:  Wrap-Up Group:   The focus of this group is to help patients review their daily goal of treatment and discuss progress on daily workbooks.  Participation Level:  Did Not Attend  Participation Quality:   did not attend  Affect:   did not attend  Cognitive:   did not attend  Insight: None  Engagement in Group:   did not attend  Modes of Intervention:   did not attend  Additional Comments:  did not attend  Debe Coder 12/18/2022, 10:18 PM

## 2022-12-18 NOTE — [facility] Group Notes (Signed)
Adult Psychoeducational Group Note  Date:  12/18/2022 Time:  12:12 PM  Group Topic/Focus:  Goals Group:   The focus of this group is to help patients establish daily goals to achieve during treatment and discuss how the patient can incorporate goal setting into their daily lives to aide in recovery.  Participation Level:  Active  Participation Quality:  Appropriate  Affect:  Appropriate  Cognitive:  Appropriate  Insight: Appropriate  Engagement in Group:  Engaged  Modes of Intervention:  Education  Additional Comments:  Pt goal today is to listen more talk less. Pt has no feelings of wanting to hurt himself or others.  Shana Younge, Georgiann Mccoy 12/18/2022, 12:12 PM

## 2022-12-19 ENCOUNTER — Encounter (HOSPITAL_COMMUNITY): Payer: Self-pay

## 2022-12-19 DIAGNOSIS — F314 Bipolar disorder, current episode depressed, severe, without psychotic features: Secondary | ICD-10-CM | POA: Diagnosis not present

## 2022-12-19 MED ORDER — CLONAZEPAM 0.5 MG PO TABS: 0.5000 mg | ORAL_TABLET | Freq: Once | ORAL | Status: DC

## 2022-12-19 NOTE — Progress Notes (Signed)
[facility] MD Progress Note  12/19/2022 1:00 PM LENNARD TUELL  MRN:  [id number] Principal Problem: Severe bipolar I disorder, current or most recent episode depressed ([place]) Diagnosis: Principal Problem:   Severe bipolar I disorder, current or most recent episode depressed ([place]) Active Problems:   ADHD (attention deficit hyperactivity disorder)   GAD (generalized anxiety disorder)   Benzodiazepine dependence ([place])   Insomnia  Reason for admission: Jesse Jones is a 42 yo Caucasian male with past mental health history of ADHD, alcohol use disorder in sustained remission & GAD who presented to the [facility] on 02/08 after intentionally taking 12 mg of Xanax in a suicide attempt.  Patient is a poor historian, and initially stated he did not know why he overdosed, but then stated it was because his parents got into an argument over his medications.  Patient was IVC'd and transferred to this behavioral health Hospital for treatment and stabilization of his mental status.  24 hr chart review:  Staff reports the patient is somewhat improved.  He is likable, cooperative and compliant with treatment.  He is sleeping fairly well.  Zyprexa is being tapered off.  He is currently taking 5 mg at night.  His blood pressure is stable.  He has been attending groups.  Patient assessment note Upon evaluation today patient continues to report feeling better in general since admission, he is compliant with Klonopin 3 times daily 0.5 mg and does not present with any active withdrawals or tremors, he denies current depression or anxiety, he denies passive or active SI intention or plan, he denies HI or AVH, he reports fair sleep and appetite.  Discussed with patient plan to discharge home tomorrow, patient's father will pick him up, patient has an appointment for therapy on February 23 and for medication management on March fifth, discussed with patient will give him enough medication to last him till medication  follow-up appointment.   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   Depression    ED (erectile dysfunction) 05/12/2012   Hyperlipidemia, mild 04/18/2015   Insomnia    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   Outbursts of anger    Preventative health care 10/03/2011   Tobacco abuse 04/28/2011    Past Surgical History:  Procedure Laterality Date   NASAL SEPTUM SURGERY Bilateral    Dr Redmond Baseman 2017   punctured lung     toe surgeries  during childhood    for ingrown toenails   Family History:  Family History  Problem Relation Age of Onset   Depression Mother    Anxiety disorder Mother    Depression Brother    Diabetes Brother        type 1   Cancer Paternal Grandmother        lung/ didn't smoke   Cancer Maternal Uncle 54       colon cancer   Family Psychiatric  History: See above Social History:  Social History   Substance and Sexual Activity  Alcohol Use Yes   Alcohol/week: 36.0 standard drinks of alcohol   Types: 36 Cans of beer per week  Comment: sober x47yr No alcohol.     Social History   Substance and Sexual Activity  Drug Use Yes   Types: Marijuana, Other-see comments   Comment: pt stopped 4 year ago    Social History   Socioeconomic History   Marital status: Single    Spouse name: Not on file   Number of children: 0   Years of education: 12   Highest education level: 12th grade  Occupational History   Not on file  Tobacco Use   Smoking status: Former    Packs/day: 0.30    Years: 20.00    Total pack years: 6.00    Types: Cigarettes    Passive exposure: Current   Smokeless tobacco: Never   Tobacco comments:     Discussed Smoking Cessation and Alcohol and Drug Services  Vaping Use   Vaping Use: Never used  Substance and Sexual Activity   Alcohol use: Yes    Alcohol/week: 36.0 standard drinks of alcohol    Types: 36 Cans of beer per week    Comment: sober x490yrNo alcohol.   Drug use: Yes    Types: Marijuana, Other-see comments    Comment: pt stopped 4 year ago   Sexual activity: Yes    Partners: Female    Comment: lives alone and eating well. exercising  Other Topics Concern   Not on file  Social History Narrative   Not on file   Social Determinants of Health   Financial Resource Strain: Low Risk  (03/02/2022)   Overall Financial Resource Strain (CARDIA)    Difficulty of Paying Living Expenses: Not hard at all  Food Insecurity: No Food Insecurity (12/08/2022)   Hunger Vital Sign    Worried About Running Out of Food in the Last Year: Never true    Ran Out of Food in the Last Year: Never true  Transportation Needs: No Transportation Needs (12/08/2022)   PRAPARE - TrHydrologistMedical): No    Lack of Transportation (Non-Medical): No  Physical Activity: Inactive (03/02/2022)   Exercise Vital Sign    Days of Exercise per Week: 0 days    Minutes of Exercise per Session: 0 min  Stress: Stress Concern Present (03/02/2022)   Fi[place]  Feeling of Stress : Very much  Social Connections: Socially Isolated (03/02/2022)   Social Connection and Isolation Panel [NHANES]    Frequency of Communication with Friends and Family: More than three times a week    Frequency of Social Gatherings with Friends and Family: More than three times a week    Attends Religious Services: Never    AcMarine scientistr Organizations: No    Attends ClMusic therapistNever    Marital Status: Never married   sleep: Good  Appetite:  Poor  Current Medications: Current Facility-Administered Medications   Medication Dose Route Frequency Provider Last Rate Last Admin   alum & mag hydroxide-simeth (MAALOX/MYLANTA) 200-200-20 MG/5ML suspension 30 mL  30 mL Oral Q4H PRN Motley-Mangrum, Jadeka A, PMHNP   30 mL at 12/13/22 1407   atomoxetine (STRATTERA) capsule 25 mg  25 mg Oral BID WC GoRanae PalmsMD   25 mg at 12/19/22 0746   clonazePAM (KLONOPIN) tablet 0.5 mg  0.5 mg Oral TID GoRanae PalmsMD   0.5 mg at 12/19/22 0746   escitalopram (LEXAPRO) tablet 20 mg  20 mg Oral Daily GrMinda DittoRPH[address] [facility]  20 mg at 12/19/22 0746   feeding supplement (ENSURE ENLIVE / ENSURE PLUS) liquid 237 mL  237 mL Oral TID BM Nicholes Rough, NP   237 mL at 12/16/22 2109   fluticasone (FLONASE) 50 MCG/ACT nasal spray 2 spray  2 spray Each Nare Daily PRN Motley-Mangrum, Jadeka A, PMHNP       gabapentin (NEURONTIN) capsule 300 mg  300 mg Oral BID Ranae Palms, MD   300 mg at 12/19/22 0746   magnesium hydroxide (MILK OF MAGNESIA) suspension 30 mL  30 mL Oral Daily PRN Motley-Mangrum, Jadeka A, PMHNP       multivitamin with minerals tablet 1 tablet  1 tablet Oral q morning Motley-Mangrum, Jadeka A, PMHNP   1 tablet at 12/19/22 0908   OLANZapine zydis (ZYPREXA) disintegrating tablet 5 mg  5 mg Oral Q8H PRN Motley-Mangrum, Jadeka A, PMHNP   5 mg at 12/18/22 1954   omega-3 acid ethyl esters (LOVAZA) capsule 2 g  2 g Oral Daily Motley-Mangrum, Jadeka A, PMHNP   2 g at 12/19/22 0745   thiamine (Vitamin B-1) tablet 100 mg  100 mg Oral Daily Leevy-Johnson, Brooke A, NP   100 mg at 12/19/22 0746   traZODone (DESYREL) tablet 50 mg  50 mg Oral QHS Bobbitt, Shalon E, NP   50 mg at 12/18/22 1954    Lab Results:  No results found for this or any previous visit (from the past 71 hour(s)).    Blood Alcohol level:  Lab Results  Component Value Date   ETH <11 02/10/2013   ETH (H) 12/25/2008    181        LOWEST DETECTABLE LIMIT FOR SERUM ALCOHOL IS 5 mg/dL FOR MEDICAL PURPOSES ONLY    Metabolic Disorder Labs: Lab  Results  Component Value Date   HGBA1C 4.6 (L) 12/09/2022   MPG 85.32 12/09/2022   MPG 97 06/23/2020   No results found for: "PROLACTIN" Lab Results  Component Value Date   CHOL 191 12/09/2022   TRIG 42 12/09/2022   HDL 60 12/09/2022   CHOLHDL 3.2 12/09/2022   VLDL 8 12/09/2022   LDLCALC 123 (H) 12/09/2022   LDLCALC 109 (H) 01/31/2022    Physical Findings: AIMS:  0 CIWA:  CIWA-Ar Total: 1 COWS:  COWS Total Score: 1  Musculoskeletal: Strength & Muscle Tone: within normal limits Gait & Station: normal Patient leans: N/A  Psychiatric Specialty Exam:  Presentation  General Appearance:  Appropriate for Environment  Eye Contact: Fair  Speech: Clear and Coherent  Speech Volume: Decreased  Handedness: Right   Mood and Affect  Mood: Anxious  Affect: Restricted   Thought Process  Thought Processes: Goal Directed  Descriptions of Associations:Intact  Orientation:Full (Time, Place and Person)  Thought Content:Logical  History of Schizophrenia/Schizoaffective disorder:No  Duration of Psychotic Symptoms:No data recorded Hallucinations:No data recorded  Ideas of Reference:None  Suicidal Thoughts:No data recorded  Homicidal Thoughts:No data recorded   Sensorium  Memory: Recent Fair; Remote Fair; Immediate Fair  Judgment: Fair  Insight: Fair   Community education officer  Concentration: Fair  Attention Span: Fair  Recall: [employer] of Knowledge: Fair  Language: Fair   Psychomotor Activity  Psychomotor Activity: No data recorded   Assets  Assets: Communication Skills; Housing   Sleep  Sleep: No data recorded    Physical Exam: Physical Exam Vitals and nursing note reviewed.  Constitutional:      Appearance: Normal appearance.  Eyes:     Pupils: Pupils are equal, round, and reactive  to light.  Musculoskeletal:        General: Normal range of motion.     Cervical back: Normal range of motion.  Neurological:      Mental Status: He is oriented to person, place, and time.    Review of Systems  Constitutional: Negative.   HENT: Negative.    Eyes: Negative.   Respiratory: Negative.    Cardiovascular: Negative.   Gastrointestinal: Negative.   Genitourinary: Negative.   Musculoskeletal: Negative.   Skin: Negative.   Neurological: Negative.  Negative for dizziness.  Psychiatric/Behavioral:  Negative for hallucinations, memory loss and suicidal ideas. The patient is nervous/anxious.    Blood pressure (!) 144/80, pulse (!) 116, temperature (!) 97.5 F (36.4 C), temperature source Oral, resp. rate 18, height 6' 2"$  (1.88 m), weight 86.2 kg, SpO2 100 %. Body mass index is 24.39 kg/m.  Treatment Plan Summary: Daily contact with patient to assess and evaluate symptoms and progress in treatment and Medication management  Observation Level/Precautions:  15 minute checks  Laboratory:  Labs reviewed: Qtc on EKG slightly prolonged at 464. Repeat EKG ordered forl trending while on antipsychotics.   Psychotherapy:  Unit Group sessions  Medications:  See [facility]  Consultations:  To be determined   Discharge Concerns:  Safety, medication compliance, mood stability  Estimated LOS: 5-7 days  Other:  N/A   Labs independently reviewed on 12/12/2022. Qtc on EKG slightly prolonged at 464. Repeat EKG ordered for trending while on antipsychotics with QTC 403. Ordered baseline UA which is WNL.   PLAN Safety and Monitoring: Voluntary admission to inpatient psychiatric unit for safety, stabilization and treatment Daily contact with patient to assess and evaluate symptoms and progress in treatment Patient's case to be discussed in multi-disciplinary team meeting Observation Level : q15 minute checks Vital signs: q12 hours Precautions: Safety  Long Term Goal(s): Improvement in symptoms so as ready for discharge  Short Term Goals: Ability to identify changes in lifestyle to reduce recurrence of condition will improve,  Ability to disclose and discuss suicidal ideas, Ability to demonstrate self-control will improve, Ability to identify and develop effective coping behaviors will improve, Ability to maintain clinical measurements within normal limits will improve, Compliance with prescribed medications will improve, and Ability to identify triggers associated with substance abuse/mental health issues will improve  Diagnoses Principal Problem:   Severe bipolar I disorder, current or most recent episode depressed ([place]) Active Problems:   ADHD (attention deficit hyperactivity disorder)   GAD (generalized anxiety disorder)   Benzodiazepine dependence ([facility])   Insomnia  Medications Continue off Adderall  -Continue gabapentin 300 mg twice daily for anxiety  -Continue Strattera to 25 mg p.o. twice daily  -Continue Lexapro 20 mg for depressive symptoms Continue Klonopin 0.5 mg 3 times daily tapering down from long-term treatment of benzodiazepine to avoid withdrawals, given history of worsening withdrawal during faster taper down will continue with this dose at time of discharge with recommendation to taper down further gradually on outpatient basis. -Continue Ensures TID for nutritional support -Discontinued BuSpar on 12/16 -Continue hydroxyzine 25 mg as needed 3 times daily for anxiety -Continue Lovaza 2 g daily for hyperlipidemia Patient educated on rationales, benefits and possible side effects of all medications listed above, and agreeable with trials, and plan.   Other PRNS -Continue Tylenol 650 mg every 6 hours PRN for mild pain -Continue Maalox 30 mg every 4 hrs PRN for indigestion -Continue Imodium 2-4 mg as needed for diarrhea -Continue Milk of Magnesia as needed every  6 hrs for constipation -Continue Zofran disintegrating tabs every 6 hrs PRN for nausea   Discharge Planning: Social work and case management to assist with discharge planning and identification of hospital follow-up needs prior to  discharge Estimated LOS: Patient is not ready for discharge on Monday and will require until Wednesday or Thursday for improved stability. Discharge Concerns: Need to establish a safety plan; Medication compliance and effectiveness Discharge Goals: Return home with outpatient referrals for mental health follow-up including medication management/psychotherapy  I certify that inpatient services furnished can reasonably be expected to improve the patient's condition.    Dian Situ, MD 12/19/2022, 1:00 PMPatient ID: [name], male   DOB: Mar 05, 1981, 42 y.o.   MRN: [id number] Total Time Spent in Direct Patient Care:  I personally spent 30 minutes on the unit in direct patient care. The direct patient care time included face-to-face time with the patient, reviewing the patient's chart, communicating with other professionals, and coordinating care. Greater than 50% of this time was spent in counseling or coordinating care with the patient regarding goals of hospitalization, psycho-education, and discharge planning needs.   Patient ID: DALTEN DEMBEK, male   DOB: [birth year], 42 y.o.   MRN: [id number] Patient ID: JEREMAIAH DENNIS, male   DOB: 1981-03-10, 42 y.o.   MRN: [id number]

## 2022-12-19 NOTE — Progress Notes (Signed)
Pt was complaining of feeling shaky this evening, pt was requesting a shot "like they gave me before" . Per MAR it appeared that pt received a Geodon shot on 2/15 . Pt was informed that the shot he receive before was not for feeling shaky. Pt got upset, so pt was given PRN Zyprexa and his night time Trazodone early and educated to come back if that did not work.

## 2022-12-19 NOTE — BH IP Treatment Plan (Signed)
Interdisciplinary Treatment and Diagnostic Plan Update  12/19/2022 Time of Session: 10:15am  Jesse Jones MRN: [id number]  Principal Diagnosis: Severe bipolar I disorder, current or most recent episode depressed ([place])  Secondary Diagnoses: Principal Problem:   Severe bipolar I disorder, current or most recent episode depressed ([place]) Active Problems:   ADHD (attention deficit hyperactivity disorder)   GAD (generalized anxiety disorder)   Benzodiazepine dependence ([place])   Insomnia   Current Medications:  Current Facility-Administered Medications  Medication Dose Route Frequency Provider Last Rate Last Admin   alum & mag hydroxide-simeth (MAALOX/MYLANTA) 200-200-20 MG/5ML suspension 30 mL  30 mL Oral Q4H PRN Motley-Mangrum, Jadeka A, PMHNP   30 mL at 12/13/22 1407   atomoxetine (STRATTERA) capsule 25 mg  25 mg Oral BID WC Ranae Palms, MD   25 mg at 12/19/22 0746   clonazePAM (KLONOPIN) tablet 0.5 mg  0.5 mg Oral TID Ranae Palms, MD   0.5 mg at 12/19/22 1300   escitalopram (LEXAPRO) tablet 20 mg  20 mg Oral Daily Minda Ditto, RPH   20 mg at 12/19/22 0746   feeding supplement (ENSURE ENLIVE / ENSURE PLUS) liquid 237 mL  237 mL Oral TID BM Nicholes Rough, NP   237 mL at 12/16/22 2109   fluticasone (FLONASE) 50 MCG/ACT nasal spray 2 spray  2 spray Each Nare Daily PRN Motley-Mangrum, Jadeka A, PMHNP       gabapentin (NEURONTIN) capsule 300 mg  300 mg Oral BID Ranae Palms, MD   300 mg at 12/19/22 0746   magnesium hydroxide (MILK OF MAGNESIA) suspension 30 mL  30 mL Oral Daily PRN Motley-Mangrum, Jadeka A, PMHNP       multivitamin with minerals tablet 1 tablet  1 tablet Oral q morning Motley-Mangrum, Jadeka A, PMHNP   1 tablet at 12/19/22 0908   OLANZapine zydis (ZYPREXA) disintegrating tablet 5 mg  5 mg Oral Q8H PRN Motley-Mangrum, Jadeka A, PMHNP   5 mg at 12/18/22 1954   omega-3 acid ethyl esters (LOVAZA) capsule 2 g  2 g Oral Daily Motley-Mangrum, Jadeka A, PMHNP   2 g at  12/19/22 0745   thiamine (Vitamin B-1) tablet 100 mg  100 mg Oral Daily Leevy-Johnson, Brooke A, NP   100 mg at 12/19/22 0746   traZODone (DESYREL) tablet 50 mg  50 mg Oral QHS Bobbitt, Shalon E, NP   50 mg at 12/18/22 1954   PTA Medications: Medications Prior to Admission  Medication Sig Dispense Refill Last Dose   ALPRAZolam (XANAX XR) 3 MG 24 hr tablet Take 1 tablet (3 mg total) by mouth daily. 30 tablet 0    amphetamine-dextroamphetamine (ADDERALL XR) 30 MG 24 hr capsule Take 30 mg by mouth every morning.      cholecalciferol (VITAMIN D) 1000 UNITS tablet Take 1,000 Units by mouth every morning.      escitalopram (LEXAPRO) 20 MG tablet Take 1 tablet daily 30 tablet 1    fish oil-omega-3 fatty acids 1000 MG capsule Take 2 g by mouth daily.      fluticasone (FLONASE) 50 MCG/ACT nasal spray SPRAY 2 SPRAYS INTO BOTH NOSTRILS DAILY AS NEEDED FOR ALLERGIES OR RHINITIS. (Patient taking differently: Place 2 sprays into both nostrils daily as needed for allergies.) 48 mL 2    lisdexamfetamine (VYVANSE) 50 MG capsule TAKE 1 CAPSULE BY MOUTH EVERY DAY (Patient not taking: Reported on 12/07/2022) 30 capsule 0    lisdexamfetamine (VYVANSE) 50 MG capsule Take 1 capsule (50 mg total) by mouth daily. (  Patient not taking: Reported on 12/07/2022) 30 capsule 0    Multiple Vitamin (MULTIVITAMIN) tablet Take 1 tablet by mouth every morning.      OLANZapine (ZYPREXA) 15 MG tablet Take 15 mg by mouth at bedtime.      vitamin B-12 (CYANOCOBALAMIN) 1000 MCG tablet Take 1,000 mcg by mouth every morning.      vitamin C (ASCORBIC ACID) 500 MG tablet Take 500 mg by mouth daily.       Patient Stressors:    Patient Strengths:    Treatment Modalities: Medication Management, Group therapy, Case management,  1 to 1 session with clinician, Psychoeducation, Recreational therapy.   Physician Treatment Plan for Primary Diagnosis: Severe bipolar I disorder, current or most recent episode depressed ([place]) Long Term Goal(s):  Improvement in symptoms so as ready for discharge   Short Term Goals: Ability to identify changes in lifestyle to reduce recurrence of condition will improve Ability to disclose and discuss suicidal ideas Ability to demonstrate self-control will improve Ability to identify and develop effective coping behaviors will improve Ability to maintain clinical measurements within normal limits will improve Compliance with prescribed medications will improve Ability to identify triggers associated with substance abuse/mental health issues will improve  Medication Management: Evaluate patient's response, side effects, and tolerance of medication regimen.  Therapeutic Interventions: 1 to 1 sessions, Unit Group sessions and Medication administration.  Evaluation of Outcomes: Progressing  Physician Treatment Plan for Secondary Diagnosis: Principal Problem:   Severe bipolar I disorder, current or most recent episode depressed ([place]) Active Problems:   ADHD (attention deficit hyperactivity disorder)   GAD (generalized anxiety disorder)   Benzodiazepine dependence ([place])   Insomnia  Long Term Goal(s): Improvement in symptoms so as ready for discharge   Short Term Goals: Ability to identify changes in lifestyle to reduce recurrence of condition will improve Ability to disclose and discuss suicidal ideas Ability to demonstrate self-control will improve Ability to identify and develop effective coping behaviors will improve Ability to maintain clinical measurements within normal limits will improve Compliance with prescribed medications will improve Ability to identify triggers associated with substance abuse/mental health issues will improve     Medication Management: Evaluate patient's response, side effects, and tolerance of medication regimen.  Therapeutic Interventions: 1 to 1 sessions, Unit Group sessions and Medication administration.  Evaluation of Outcomes: Progressing   RN Treatment  Plan for Primary Diagnosis: Severe bipolar I disorder, current or most recent episode depressed ([place]) Long Term Goal(s): Knowledge of disease and therapeutic regimen to maintain health will improve  Short Term Goals: Ability to remain free from injury will improve, Ability to participate in decision making will improve, Ability to verbalize feelings will improve, Ability to disclose and discuss suicidal ideas, and Ability to identify and develop effective coping behaviors will improve  Medication Management: RN will administer medications as ordered by provider, will assess and evaluate patient's response and provide education to patient for prescribed medication. RN will report any adverse and/or side effects to prescribing provider.  Therapeutic Interventions: 1 on 1 counseling sessions, Psychoeducation, Medication administration, Evaluate responses to treatment, Monitor vital signs and CBGs as ordered, Perform/monitor CIWA, COWS, AIMS and Fall Risk screenings as ordered, Perform wound care treatments as ordered.  Evaluation of Outcomes: Progressing   LCSW Treatment Plan for Primary Diagnosis: Severe bipolar I disorder, current or most recent episode depressed ([place]) Long Term Goal(s): Safe transition to appropriate next level of care at discharge, Engage patient in therapeutic group addressing interpersonal concerns.  Short  Term Goals: Engage patient in aftercare planning with referrals and resources, Increase social support, Increase emotional regulation, Facilitate acceptance of mental health diagnosis and concerns, Identify triggers associated with mental health/substance abuse issues, and Increase skills for wellness and recovery  Therapeutic Interventions: Assess for all discharge needs, 1 to 1 time with Social worker, Explore available resources and support systems, Assess for adequacy in community support network, Educate family and significant other(s) on suicide prevention, Complete  Psychosocial Assessment, Interpersonal group therapy.  Evaluation of Outcomes: Progressing    Progress in Treatment: Attending groups: Yes. Participating in groups: Yes. Taking medication as prescribed: Yes. Toleration medication: Yes. Family/Significant other contact made: Yes, individual(s) contacted:  Delfino Lovett and Mondell Kolk [phone] (Mother and Father)  Patient understands diagnosis: Yes. Discussing patient identified problems/goals with staff: Yes. Medical problems stabilized or resolved: Yes. Denies suicidal/homicidal ideation: Yes. Issues/concerns per patient self-inventory: Yes. Other:    New problem(s) identified: No, Describe:  No reported   New Short Term/Long Term Goal(s): stabilization, elimination of SI thoughts, development of comprehensive mental wellness plan.  medication    Patient Goals:  Medication Stabilization/Coping Skills   Discharge Plan or Barriers: None Reported   Reason for Continuation of Hospitalization: Anxiety Depression Medication stabilization Suicidal ideation Withdrawal symptoms   Estimated Length of Stay: 3-7 Days  Last 3 [place] Suicide Severity Risk Score: Flowsheet Row Admission (Current) from 12/08/2022 in [facility] ED from 12/06/2022 in [facility] from 03/02/2022 in [facility] at [place] Low Risk High Risk High Risk       Last [facility] 2/9 Scores:    04/20/2022   11:07 AM 03/02/2022   11:42 AM 02/27/2022   10:45 AM  Depression screen PHQ 2/9  Decreased Interest 3 3 3  $ Down, Depressed, Hopeless 3 3 3  $ PHQ - 2 Score 6 6 6  $ Altered sleeping 3 3 3  $ Tired, decreased energy 3 2 2  $ Change in appetite 3 0 0  Feeling bad or failure about yourself  3 3 3  $ Trouble concentrating 3 3 3  $ Moving slowly or fidgety/restless 3 3 3  $ Suicidal thoughts 1 0 3  PHQ-9 Score 25 20 23   $ Difficult doing work/chores Extremely dIfficult Extremely dIfficult Extremely dIfficult    Scribe for Treatment Team: Darleen Crocker, Latanya Presser 12/19/2022 1:22 PM

## 2022-12-19 NOTE — Progress Notes (Signed)
Patient appeared anxious this morning coming up to night shift RN stating he needed anxiety medication because he was having tremors. When checking on patient, he stated "I just had a seizure 30 minutes ago." "Im shaking, I'm anxious." Vs assessed and patient given scheduled medications. Patient managed to calm down after taking medication and went back to rest in room. Patient in stable condition and no current distress. Patient remains mostly isolated in room. Did not attend group or recreation.

## 2022-12-19 NOTE — Progress Notes (Signed)
The patient attended a portion of the evening N.A.meeting.

## 2022-12-19 NOTE — Progress Notes (Signed)
   12/19/22 2230  Psych Admission Type (Psych Patients Only)  Admission Status Involuntary  Psychosocial Assessment  Patient Complaints Shakiness  Eye Contact Fair  Facial Expression Anxious  Affect Anxious  Speech Logical/coherent  Interaction Assertive  Motor Activity Slow  Appearance/Hygiene Unremarkable  Behavior Characteristics Anxious  Mood Anxious  Aggressive Behavior  Effect No apparent injury  Thought Process  Coherency WDL  Content WDL  Delusions WDL  Perception WDL  Hallucination None reported or observed  Judgment Impaired  Confusion None  Danger to Self  Current suicidal ideation? Denies  Danger to Others  Danger to Others None reported or observed

## 2022-12-19 NOTE — Progress Notes (Signed)
   12/19/22 0345  Psych Admission Type (Psych Patients Only)  Admission Status Involuntary  Psychosocial Assessment  Patient Complaints Anxiety;Shakiness  Eye Contact Fair  Facial Expression Anxious  Affect Anxious  Speech Logical/coherent  Interaction Assertive  Motor Activity Slow  Appearance/Hygiene Unremarkable  Behavior Characteristics Anxious  Mood Anxious  Aggressive Behavior  Effect No apparent injury  Thought Process  Coherency WDL  Content WDL  Delusions WDL  Perception WDL  Hallucination None reported or observed  Judgment Impaired  Confusion None  Danger to Self  Current suicidal ideation? Denies  Danger to Others  Danger to Others None reported or observed

## 2022-12-19 NOTE — Plan of Care (Signed)
  Problem: Coping: Goal: Ability to verbalize frustrations and anger appropriately will improve Outcome: Progressing   Problem: Health Behavior/Discharge Planning: Goal: Compliance with treatment plan for underlying cause of condition will improve Outcome: Progressing   Problem: Safety: Goal: Periods of time without injury will increase Outcome: Progressing   

## 2022-12-19 NOTE — Progress Notes (Addendum)
Patient laying in bed shaking stating he is having severe tremors. Patient VS checked and VS WDL.  MD notified. Patient then stopped shaking with relaxation techniques and deep breathing. Patient then was able to get up, ambulate, and requested gatorade. Patient given gatorade along with his scheduled klonopin and other scheduled medications. Patient in no current distress.  BP 126/77 (BP Location: Right Arm)   Pulse 93   Temp (!) 97.5 F (36.4 C) (Oral)   Resp 18   Ht 6' 2"$  (1.88 m)   Wt 86.2 kg   SpO2 97%   BMI 24.39 kg/m   1813: Patient remains stable and got into shower with encouragement. Patient reports decreased anxiety at this time.

## 2022-12-20 DIAGNOSIS — F314 Bipolar disorder, current episode depressed, severe, without psychotic features: Secondary | ICD-10-CM | POA: Diagnosis not present

## 2022-12-20 MED ORDER — CLONAZEPAM 0.5 MG PO TABS: 0.5000 mg | ORAL_TABLET | Freq: Three times a day (TID) | ORAL | 0 refills | Status: DC

## 2022-12-20 MED ORDER — ATOMOXETINE HCL 25 MG PO CAPS: 25.0000 mg | ORAL_CAPSULE | Freq: Two times a day (BID) | ORAL | 0 refills | Status: DC

## 2022-12-20 MED ORDER — GABAPENTIN 300 MG PO CAPS: 300.0000 mg | ORAL_CAPSULE | Freq: Two times a day (BID) | ORAL | 0 refills | Status: DC

## 2022-12-20 MED ORDER — TRAZODONE HCL 50 MG PO TABS: 50.0000 mg | ORAL_TABLET | Freq: Every day | ORAL | 0 refills | Status: DC

## 2022-12-20 MED ORDER — ESCITALOPRAM OXALATE 20 MG PO TABS: 20.0000 mg | ORAL_TABLET | Freq: Every day | ORAL | 0 refills | Status: DC

## 2022-12-20 NOTE — [facility] Suicide Risk Assessment (Signed)
[facility] Discharge Suicide Risk Assessment   Principal Problem: Severe bipolar I disorder, current or most recent episode depressed [facility]) Discharge Diagnoses: Principal Problem:   Severe bipolar I disorder, current or most recent episode depressed ([place]) Active Problems:   ADHD (attention deficit hyperactivity disorder)   GAD (generalized anxiety disorder)   Benzodiazepine dependence ([place])   Insomnia   Total Time spent with patient: 45 minutes  Reason for admission: Jesse Jones is a 42 year old male with past psychiatric history of ADHD, bipolar affect, anxiety, bipolar disorder, mixed, head trama, intentional overdose who presented to [facility] 12/06/22 for intentional overdose of prescription Xanax where he expressed to ED physician 'he would plan to do it again'. He was evaluated and placed on IVC. Per chart review, patient reports that he 'got fed up' with having to take medicine, he says his parents frustrated him about his medication and they got into an argument and so "I took it all."  When provider asked him what all meant he said he took 3 olanzapine, and 1 Xanax at this time he is unsure how many milligrams of each medications were.  Patient does not know why he did it, and understands how dangerous it could have been.  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 30 tablet 0     amphetamine-dextroamphetamine (ADDERALL XR) 30 MG 24 hr capsule Take 30 mg by mouth every  morning.         cholecalciferol (VITAMIN D) 1000 UNITS tablet Take 1,000 Units by mouth every morning.         escitalopram (LEXAPRO) 20 MG tablet Take 1 tablet daily 30 tablet 1     fish oil-omega-3 fatty acids 1000 MG capsule Take 2 g by mouth daily.         fluticasone (FLONASE) 50 MCG/ACT nasal spray SPRAY 2 SPRAYS INTO BOTH NOSTRILS DAILY AS NEEDED FOR ALLERGIES OR RHINITIS. (Patient taking differently: Place 2 sprays into both nostrils daily as needed for allergies.) 48 mL 2     lisdexamfetamine (VYVANSE) 50 MG capsule TAKE 1 CAPSULE BY MOUTH EVERY DAY (Patient not taking: Reported on 12/07/2022) 30 capsule 0     lisdexamfetamine (VYVANSE) 50 MG capsule Take 1 capsule (50 mg total) by mouth daily. (Patient not taking: Reported on 12/07/2022) 30 capsule 0     Multiple Vitamin (MULTIVITAMIN) tablet Take 1 tablet by mouth every morning.         OLANZapine (ZYPREXA) 15 MG tablet Take 15 mg by mouth at bedtime.         vitamin B-12 (CYANOCOBALAMIN) 1000 MCG tablet Take 1,000 mcg by mouth every morning.         vitamin C (ASCORBIC ACID) 500 MG tablet Take 500 mg by mouth daily.          Hospital Course:   During the patient's hospitalization, patient had extensive initial psychiatric evaluation, and follow-up  psychiatric evaluations every day.  Psychiatric diagnoses provided upon initial assessment: Bipolar affect, depressed ([place]) , Benzodiazepine dependence  Patient's psychiatric medications were adjusted on admission:           - Escitalopram 20 mg daily;  depression             - Lorazepam Detox Protocol in place             - Agitation Protocol: Olanzapine 5  mg,  Geodon 20 mg IM - Lovaza 2 mg daily - multivitamin tablet daily  During the hospitalization, other adjustments were made to the patient's psychiatric medication regimen: patient demonstrated ongoing withdrawals from benzodiazepines after finishing his detox protocol sec to being on relatively high dose of xanax xr for long time,  patient was started on klonopin and was tapered down to  mg tid which was cont at time of dc to avoid any further withdrawals.  In addition he was continued on Lexapro 20 mg daily for depression, gabapentin 300 mg twice daily for anxiety and mood, trazodone 50 mg at bedtime for sleep, during hospital stay he did receive some doses of Zyprexa as needed for irritability but no physical agitation or aggression was reported.  Patient's care was discussed during the interdisciplinary team meeting every day during the hospitalization.  The patient denied having side effects to prescribed psychiatric medication.  Gradually, patient started adjusting to milieu. The patient was evaluated each day by a clinical provider to ascertain response to treatment. Improvement was noted by the patient's report of decreasing symptoms, improved sleep and appetite, affect, medication tolerance, behavior, and participation in unit programming.  Patient was asked each day to complete a self inventory noting mood, mental status, pain, new symptoms, anxiety and concerns.    Symptoms were reported as significantly decreased or resolved completely by discharge.   On day of discharge, patient was evaluated the patient reports that their mood is stable. The patient denied having suicidal thoughts for more than 48 hours prior to discharge.  Patient denies having homicidal thoughts.  Patient denies having auditory hallucinations.  Patient denies any visual hallucinations or other symptoms of psychosis. The patient was motivated to continue taking medication with a goal of continued improvement in mental health.  On day of discharge discussed with patient plan to discharge him on Klonopin 0.5 mg 3 times daily for anxiety to avoid further withdrawals but long-term recommendation to taper down Klonopin over the next few months to avoid further tolerance and dependence.  Also discussed recommendation for outpatient provider to consider  starting BuSpar for anxiety in the future despite patient resisting that earlier during this hospitalization. The patient reports their target psychiatric symptoms of depression, anxiety, SI responded well to the psychiatric medications, and the patient reports overall benefit other psychiatric hospitalization. Supportive psychotherapy was provided to the patient. The patient also participated in regular group therapy while hospitalized. Coping skills, problem solving as well as relaxation therapies were also part of the unit programming.  Labs were reviewed with the patient, and abnormal results were discussed with the patient.  The patient is able to verbalize their individual safety plan to this provider.  Behavioral Events: None  Restraints: None  Groups: Attended and participated  Medications Changes: As above  Sleep  Sleep: Good, improved during hospital stay  Musculoskeletal: Strength & Muscle Tone: within normal limits Gait & Station: normal Patient leans: N/A  Psychiatric Specialty Exam  General Appearance: appears at stated age, fairly dressed and groomed  Behavior: pleasant and cooperative  Psychomotor Activity:No psychomotor agitation or retardation noted   Eye Contact: good Speech: normal amount, tone, volume and latency   Mood: euthymic Affect: congruent, pleasant and interactive  Thought Process: linear, goal directed, no circumstantial or tangential thought process noted, no racing thoughts or flight of ideas Descriptions of Associations: intact Thought Content: Hallucinations: denies AH, VH , does not appear responding to stimuli Delusions: No paranoia or other delusions noted Suicidal Thoughts: denies SI, intention, plan  Homicidal Thoughts: denies HI, intention, plan   Alertness/Orientation: alert and fully oriented  Insight: fair, improved Judgment: fair, improved  Memory: intact  Executive Functions  Concentration: intact  Attention Span:  Fair Recall: intact Fund of Knowledge: fair   Community education officer  Concentration: intact Attention Span: Fair Recall: intact Fund of Knowledge: fair   Assets  Assets: Armed forces logistics/support/administrative officer; Housing   Physical Exam: Physical Exam ROS Blood pressure 119/63, pulse (!) 116, temperature (!) 97.4 F (36.3 C), temperature source Oral, resp. rate 18, height 6' 2"$  (1.88 m), weight 86.2 kg, SpO2 100 %. Body mass index is 24.39 kg/m.  Mental Status Per Nursing Assessment::   On Admission:  Suicidal ideation indicated by patient  Demographic Factors:  Male and Living alone  Loss Factors: NA  Historical Factors: Impulsivity  Risk Reduction Factors:   Positive social support and Positive coping skills or problem solving skills  Continued Clinical Symptoms: Symptoms improved during hospital stay Bipolar Disorder:   Depressive phase  Cognitive Features That Contribute To Risk:  None    Suicide Risk:  Minimal: No identifiable suicidal ideation.  Patients presenting with no risk factors but with morbid ruminations; may be classified as minimal risk based on the severity of the depressive symptoms   [place]. Go on 12/21/2022.   Specialty: Behavioral Health Why: You have an appointment for therapy services on 12/21/22 at 11:30 am with [name], in person.    You also have an appointment for medication management services on 01/01/23 at 10:40 am with [name].  Both appointments will be held in person. Contact information: [place] [phone]                 Plan Of Care/Follow-up recommendations:    Discharge recommendations:     Activity: as tolerated  Diet: heart healthy  # It is recommended to the patient to continue psychiatric medications as prescribed, after discharge from the hospital.     # It is recommended to the patient to follow up with your outpatient  psychiatric provider and PCP.   # It was discussed with the patient, the impact of alcohol, drugs, tobacco have been there overall psychiatric and medical wellbeing, and total abstinence from substance use was recommended the patient.ed.   # Prescriptions provided or sent directly to preferred pharmacy at discharge. Patient agreeable to plan. Given opportunity to ask questions. Appears to feel comfortable with discharge.    # In the event of worsening symptoms, the patient is instructed to call the crisis hotline, 911 and or go to the nearest ED for appropriate evaluation and treatment of symptoms. To follow-up with primary care provider for other medical issues, concerns and or health care needs   # Patient was discharged home with a plan to follow up as noted above.   Patient agrees with D/C instructions and plan.  The patient received suicide prevention pamphlet:  Yes Belongings returned:  Clothing and Valuables  Total Time Spent in Direct Patient Care:  I personally spent 45 minutes on the unit in direct patient care. The direct patient care time included face-to-face time with the patient, reviewing the patient's chart, communicating with other professionals, and coordinating care. Greater than 50% of this time was spent in counseling or coordinating care with the patient regarding goals of hospitalization, psycho-education, and discharge planning needs.   Jacara Benito Winfred Leeds 12/20/2022, 8:11 AM   Christene Pounds Winfred Leeds, MD 12/20/2022, 8:11 AM

## 2022-12-20 NOTE — Discharge Summary (Signed)
Physician Discharge Summary Note  Patient:  Jesse Jones is an 42 y.o., male MRN:  [id number] DOB:  1981-07-17 Patient phone:  [phone] (home)  Patient address:   [place] [address],  Total Time spent with patient: 45 minutes  Date of Admission:  12/08/2022 Date of Discharge: 12/20/2022  Reason for Admission:   Jesse Jones is a 42 year old male with past psychiatric history of ADHD, bipolar affect, anxiety, bipolar disorder, mixed, head trama, intentional overdose who presented to [facility] 12/06/22 for intentional overdose of prescription Xanax where he expressed to ED physician 'he would plan to do it again'. He was evaluated and placed on IVC. Per chart review, patient reports that he 'got fed up' with having to take medicine, he says his parents frustrated him about his medication and they got into an argument and so "I took it all."  When provider asked him what all meant he said he took 3 olanzapine, and 1 Xanax at this time he is unsure how many milligrams of each medications were.  Patient does not know why he did it, and understands how dangerous it could have been.  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 He denies any illicit drug or alcohol use; identifies as a recovering alcoholic and has not had a drink in 8 years   Principal Problem: Severe bipolar I disorder, current or most recent episode depressed [facility]) Discharge Diagnoses: Principal Problem:   Severe bipolar I disorder, current or most recent episode depressed ([place]) Active Problems:   ADHD (attention deficit hyperactivity disorder)   GAD (generalized anxiety disorder)   Benzodiazepine dependence ([place])   Insomnia   Past Psychiatric History:   bipolar  disorder mixed, ADHD, anxiety, intentional overdose   Past Medical History:  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   Depression    ED (erectile dysfunction) 05/12/2012   Hyperlipidemia, mild 04/18/2015   Insomnia    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   Outbursts of anger    Preventative health care 10/03/2011   Tobacco abuse 04/28/2011    Past Surgical History:  Procedure Laterality Date   NASAL SEPTUM SURGERY Bilateral    Dr Redmond Baseman 2017   punctured lung     toe surgeries  during childhood    for ingrown toenails    Family History:  Family History  Problem Relation Age of Onset   Depression Mother    Anxiety disorder Mother    Depression Brother    Diabetes Brother        type 1   Cancer Paternal Grandmother        lung/ didn't smoke   Cancer Maternal Uncle 63       colon cancer   Family Psychiatric  History:  Denies any  Social History:  Social History   Substance and Sexual Activity  Alcohol Use Yes   Alcohol/week: 36.0 standard drinks of alcohol   Types: 36 Cans  of beer per week   Comment: sober x80yr No alcohol.     Social History   Substance and Sexual Activity  Drug Use Yes   Types: Marijuana, Other-see comments   Comment: pt stopped 4 year ago    Social History   Socioeconomic History   Marital status: Single    Spouse name: Not on file   Number of children: 0   Years of education: 12   Highest education level: 12th grade  Occupational History   Not on file  Tobacco Use   Smoking status: Former    Packs/day: 0.30    Years: 20.00    Total pack years: 6.00    Types: Cigarettes    Passive exposure: Current   Smokeless tobacco: Never   Tobacco comments:    Discussed Smoking Cessation and Alcohol and  Drug Services  Vaping Use   Vaping Use: Never used  Substance and Sexual Activity   Alcohol use: Yes    Alcohol/week: 36.0 standard drinks of alcohol    Types: 36 Cans of beer per week    Comment: sober x465yrNo alcohol.   Drug use: Yes    Types: Marijuana, Other-see comments    Comment: pt stopped 4 year ago   Sexual activity: Yes    Partners: Female    Comment: lives alone and eating well. exercising  Other Topics Concern   Not on file  Social History Narrative   Not on file   Social Determinants of Health   Financial Resource Strain: Low Risk  (03/02/2022)   Overall Financial Resource Strain (CARDIA)    Difficulty of Paying Living Expenses: Not hard at all  Food Insecurity: No Food Insecurity (12/08/2022)   Hunger Vital Sign    Worried About Running Out of Food in the Last Year: Never true    Ran Out of Food in the Last Year: Never true  Transportation Needs: No Transportation Needs (12/08/2022)   PRAPARE - TrHydrologistMedical): No    Lack of Transportation (Non-Medical): No  Physical Activity: Inactive (03/02/2022)   Exercise Vital Sign    Days of Exercise per Week: 0 days    Minutes of Exercise per Session: 0 min  Stress: Stress Concern Present (03/02/2022)   [place]  Feeling of Stress : Very much  Social Connections: Socially Isolated (03/02/2022)   Social Connection and Isolation Panel [NHANES]    Frequency of Communication with Friends and Family: More than three times a week    Frequency of Social Gatherings with Friends and Family: More than three times a week    Attends Religious Services: Never    AcMarine scientistr Organizations: No    Attends ClArchivisteetings: Never    Marital Status: Never married   Born with left hand deformity. Reports multiple traumas to the head.    Substance Use History:  See H&P  Hospital Course:   During the  patient's hospitalization, patient had extensive initial psychiatric evaluation, and follow-up psychiatric evaluations every day.   Psychiatric diagnoses provided upon initial assessment: Bipolar affect, depressed ([place], Benzodiazepine dependence   Patient's psychiatric medications were adjusted on admission:           - Escitalopram 20 mg daily;  depression             - Lorazepam Detox Protocol in place             -  Agitation Protocol: Olanzapine 5  mg,  Geodon 20 mg IM - Lovaza 2 mg daily - multivitamin tablet daily   During the hospitalization, other adjustments were made to the patient's psychiatric medication regimen: patient demonstrated ongoing withdrawals from benzodiazepines after finishing his detox protocol sec to being on relatively high dose of xanax xr for long time, patient was started on klonopin and was tapered down to  mg tid which was cont at time of dc to avoid any further withdrawals.  In addition he was continued on Lexapro 20 mg daily for depression, gabapentin 300 mg twice daily for anxiety and mood, trazodone 50 mg at bedtime for sleep, during hospital stay he did receive some doses of Zyprexa as needed for irritability but no physical agitation or aggression was reported.   Patient's care was discussed during the interdisciplinary team meeting every day during the hospitalization.   The patient denied having side effects to prescribed psychiatric medication.   Gradually, patient started adjusting to milieu. The patient was evaluated each day by a clinical provider to ascertain response to treatment. Improvement was noted by the patient's report of decreasing symptoms, improved sleep and appetite, affect, medication tolerance, behavior, and participation in unit programming.  Patient was asked each day to complete a self inventory noting mood, mental status, pain, new symptoms, anxiety and concerns.     Symptoms were reported as significantly decreased or resolved  completely by discharge.    On day of discharge, patient was evaluated the patient reports that their mood is stable. The patient denied having suicidal thoughts for more than 48 hours prior to discharge.  Patient denies having homicidal thoughts.  Patient denies having auditory hallucinations.  Patient denies any visual hallucinations or other symptoms of psychosis. The patient was motivated to continue taking medication with a goal of continued improvement in mental health.  On day of discharge discussed with patient plan to discharge him on Klonopin 0.5 mg 3 times daily for anxiety to avoid further withdrawals but long-term recommendation to taper down Klonopin over the next few months to avoid further tolerance and dependence.  Also discussed recommendation for outpatient provider to consider starting BuSpar for anxiety in the future despite patient resisting that earlier during this hospitalization. The patient reports their target psychiatric symptoms of depression, anxiety, SI responded well to the psychiatric medications, and the patient reports overall benefit other psychiatric hospitalization. Supportive psychotherapy was provided to the patient. The patient also participated in regular group therapy while hospitalized. Coping skills, problem solving as well as relaxation therapies were also part of the unit programming.   Labs were reviewed with the patient, and abnormal results were discussed with the patient.   The patient is able to verbalize their individual safety plan to this provider.   Behavioral Events: None   Restraints: None   Groups: Attended and participated   Medications Changes: As above   Sleep  Sleep: Good, improved during hospital stay  Physical Findings: AIMS:  , ,  ,  ,    CIWA:  CIWA-Ar Total: 1 COWS:  COWS Total Score: 1  Musculoskeletal: Strength & Muscle Tone: within normal limits Gait & Station: normal Patient leans: N/A   Psychiatric Specialty  Exam:  General Appearance: appears at stated age, fairly dressed and groomed  Behavior: pleasant and cooperative  Psychomotor Activity:No psychomotor agitation or retardation noted   Eye Contact: good Speech: normal amount, tone, volume and latency   Mood: euthymic Affect: congruent, pleasant and interactive  Thought  Process: linear, goal directed, no circumstantial or tangential thought process noted, no racing thoughts or flight of ideas Descriptions of Associations: intact Thought Content: Hallucinations: denies AH, VH , does not appear responding to stimuli Delusions: No paranoia or other delusions noted Suicidal Thoughts: denies SI, intention, plan  Homicidal Thoughts: denies HI, intention, plan   Alertness/Orientation: alert and fully oriented  Insight: fair, improved Judgment: fair, improved  Memory: intact  Executive Functions  Concentration: intact  Attention Span: Fair Recall: intact Fund of Knowledge: fair   Assets  Assets: Armed forces logistics/support/administrative officer; Housing    Physical Exam:  Physical Exam Vitals and nursing note reviewed.  Constitutional:      Appearance: Normal appearance.  HENT:     Head: Normocephalic and atraumatic.     Nose: Nose normal.  Eyes:     Pupils: Pupils are equal, round, and reactive to light.  Pulmonary:     Effort: Pulmonary effort is normal.  Musculoskeletal:        General: Normal range of motion.     Cervical back: Normal range of motion.  Neurological:     General: No focal deficit present.     Mental Status: He is alert and oriented to person, place, and time.    Review of Systems  All other systems reviewed and are negative.  Blood pressure 119/63, pulse (!) 116, temperature (!) 97.4 F (36.3 C), temperature source Oral, resp. rate 18, height 6' 2"$  (1.88 m), weight 86.2 kg, SpO2 100 %. Body mass index is 24.39 kg/m.   Social History   Tobacco Use  Smoking Status Former   Packs/day: 0.30   Years: 20.00   Total  pack years: 6.00   Types: Cigarettes   Passive exposure: Current  Smokeless Tobacco Never  Tobacco Comments   Discussed Smoking Cessation and Alcohol and Drug Services   Tobacco Cessation:  N/A, patient does not currently use tobacco products   Blood Alcohol level:  Lab Results  Component Value Date   ETH <11 02/10/2013   ETH (H) 12/25/2008    181        LOWEST DETECTABLE LIMIT FOR SERUM ALCOHOL IS 5 mg/dL FOR MEDICAL PURPOSES ONLY    Metabolic Disorder Labs:  Lab Results  Component Value Date   HGBA1C 4.6 (L) 12/09/2022   MPG 85.32 12/09/2022   MPG 97 06/23/2020   No results found for: "PROLACTIN" Lab Results  Component Value Date   CHOL 191 12/09/2022   TRIG 42 12/09/2022   HDL 60 12/09/2022   CHOLHDL 3.2 12/09/2022   VLDL 8 12/09/2022   LDLCALC 123 (H) 12/09/2022   LDLCALC 109 (H) 01/31/2022    See Psychiatric Specialty Exam and Suicide Risk Assessment completed by Attending Physician prior to discharge.  Discharge destination:  Home  Is patient on multiple antipsychotic therapies at discharge:  No   Has Patient had three or more failed trials of antipsychotic monotherapy by history:  No  Recommended Plan for Multiple Antipsychotic Therapies: NA  Discharge Instructions     Diet - low sodium heart healthy   Complete by: As directed    Increase activity slowly   Complete by: As directed       Allergies as of 12/20/2022       Reactions   Haloperidol Decanoate    Hydroxyzine Anxiety   EXCESSIVE ANXIOUNESS   Other Other (See Comments)   Reverse effect, super hyper   Penicillins Hives   Valium  Medication List     STOP taking these medications    ALPRAZolam 3 MG 24 hr tablet Commonly known as: XANAX XR   amphetamine-dextroamphetamine 30 MG 24 hr capsule Commonly known as: ADDERALL XR   ascorbic acid 500 MG tablet Commonly known as: VITAMIN C   cholecalciferol 1000 units tablet Commonly known as: VITAMIN D   cyanocobalamin  1000 MCG tablet Commonly known as: VITAMIN B12   lisdexamfetamine 50 MG capsule Commonly known as: Vyvanse   multivitamin tablet   OLANZapine 15 MG tablet Commonly known as: ZYPREXA   Vyvanse 50 MG capsule Generic drug: lisdexamfetamine       TAKE these medications      Indication  atomoxetine 25 MG capsule Commonly known as: STRATTERA Take 1 capsule (25 mg total) by mouth 2 (two) times daily with a meal.  Indication: Attention Deficit Hyperactivity Disorder   clonazePAM 0.5 MG tablet Commonly known as: KLONOPIN Take 1 tablet (0.5 mg total) by mouth 3 (three) times daily.  Indication: Feeling Anxious   escitalopram 20 MG tablet Commonly known as: LEXAPRO Take 1 tablet daily    fish oil-omega-3 fatty acids 1000 MG capsule Take 2 g by mouth daily.  Indication: High Amount of Cholesterol in the Blood   fluticasone 50 MCG/ACT nasal spray Commonly known as: FLONASE SPRAY 2 SPRAYS INTO BOTH NOSTRILS DAILY AS NEEDED FOR ALLERGIES OR RHINITIS. What changed: See the new instructions.  Indication: Allergic Rhinitis   gabapentin 300 MG capsule Commonly known as: NEURONTIN Take 1 capsule (300 mg total) by mouth 2 (two) times daily.  Indication: Generalized Anxiety Disorder   traZODone 50 MG tablet Commonly known as: DESYREL Take 1 tablet (50 mg total) by mouth at bedtime.  Indication: [place]. Go on 12/21/2022.   Specialty: Behavioral Health Why: You have an appointment for therapy services on 12/21/22 at 11:30 am with [name], in person.    You also have an appointment for medication management services on 01/01/23 at 10:40 am with [name].  Both appointments will be held in person. Contact information: [place] [address] [phone]                 Discharge recommendations:    Activity: as tolerated  Diet: heart healthy  # It is  recommended to the patient to continue psychiatric medications as prescribed, after discharge from the hospital.     # It is recommended to the patient to follow up with your outpatient psychiatric provider and PCP.   # It was discussed with the patient, the impact of alcohol, drugs, tobacco have been there overall psychiatric and medical wellbeing, and total abstinence from substance use was recommended the patient.ed.   # Prescriptions provided or sent directly to preferred pharmacy at discharge. Patient agreeable to plan. Given opportunity to ask questions. Appears to feel comfortable with discharge.    # In the event of worsening symptoms, the patient is instructed to call the crisis hotline, 911 and or go to the nearest ED for appropriate evaluation and treatment of symptoms. To follow-up with primary care provider for other medical issues, concerns and or health care needs   # Patient was discharged home with a plan to follow up as noted above.   Patient agrees with D/C instructions and plan.   The patient received suicide prevention pamphlet:  Yes Belongings returned:  Clothing and Valuables  Total Time Spent in Direct Patient Care:  I personally spent 45 minutes on the unit in direct patient care. The direct patient care time included face-to-face time with the patient, reviewing the patient's chart, communicating with other professionals, and coordinating care. Greater than 50% of this time was spent in counseling or coordinating care with the patient regarding goals of hospitalization, psycho-education, and discharge planning needs.    SignedDian Situ, MD 12/20/2022, 9:23 AM

## 2022-12-20 NOTE — Progress Notes (Signed)
Pt up complaining that he is having tremors, but tremors  not noticed, pt requesting PRN Zyprexa for the tremors"can I have the pill that goes under your tongue for tremors"

## 2022-12-20 NOTE — Progress Notes (Signed)
  [facility] Adult Case Management Discharge Plan :  Will you be returning to the same living situation after discharge:  Yes,  Home  At discharge, do you have transportation home?: Yes,  Father  Do you have the ability to pay for your medications: Yes,  Medicare  Release of information consent forms completed and in the chart;  Patient's signature needed at discharge.  Patient to Follow up at:  Follow-up [place]. Go on 12/21/2022.   Specialty: Behavioral Health Why: You have an appointment for therapy services on 12/21/22 at 11:30 am with [name], in person.    You also have an appointment for medication management services on 01/01/23 at 10:40 am with [name].  Both appointments will be held in person. Contact information: [address] [place] [address] [phone]                 Next level of care provider has access to [place] and Suicide Prevention discussed: Yes,  with patient, mother, and father      Has patient been referred to the Quitline?: N/A patient is not a smoker  Patient has been referred for addiction treatment: Pt. refused referral  Darleen Crocker, Latanya Presser 12/20/2022, 9:19 AM

## 2022-12-20 NOTE — Progress Notes (Addendum)
Patient is discharging at this time. Patient is A&Ox4. Vs stable. Patient denies SI,HI, and A/V/H with no plan/intent. Printed AVS reviewed with and given to patient along with medications and follow up appointments. Suicide safety plan complete with original form placed in chart. Copy provided to patient. Patient verbalized all understanding. All valuables/belongings returned to patient. Patient is being transported by his father.  Patient denies any pain. No s/s of current distress.

## 2022-12-20 NOTE — Group Note (Unsigned)
LCSW Group Therapy Note   Group Date: 12/20/2022 Start Time: 1100 End Time: 1200   Type of Therapy and Topic:  Group Therapy:   Participation Level:  {BHH PARTICIPATION [id number]  Description of Group:   Therapeutic Goals:  1.     Summary of Patient Progress:    ***  Therapeutic Modalities:   Carney Harder 12/20/2022  10:49 AM

## 2022-12-20 NOTE — Group Note (Signed)
Date:  12/20/2022 Time:  10:15 AM  Group Topic/Focus:  Goals Group:   The focus of this group is to help patients establish daily goals to achieve during treatment and discuss how the patient can incorporate goal setting into their daily lives to aide in recovery. Orientation:   The focus of this group is to educate the patient on the purpose and policies of crisis stabilization and provide a format to answer questions about their admission.  The group details unit policies and expectations of patients while admitted.    Participation Level:  Active  Participation Quality:  Appropriate  Affect:  Anxious  Cognitive:  Lacking  Insight: Limited  Engagement in Group:  Limited  Modes of Intervention:  Discussion  Additional Comments:  Pt left the group stating he was having tremors.  Garvin Fila 12/20/2022, 10:15 AM

## 2022-12-25 ENCOUNTER — Ambulatory Visit (INDEPENDENT_AMBULATORY_CARE_PROVIDER_SITE_OTHER): Payer: Medicare Other | Admitting: Family Medicine

## 2022-12-25 ENCOUNTER — Encounter: Payer: Self-pay | Admitting: Family Medicine

## 2022-12-25 ENCOUNTER — Encounter (HOSPITAL_BASED_OUTPATIENT_CLINIC_OR_DEPARTMENT_OTHER): Payer: Self-pay | Admitting: Emergency Medicine

## 2022-12-25 ENCOUNTER — Other Ambulatory Visit: Payer: Self-pay

## 2022-12-25 ENCOUNTER — Emergency Department (HOSPITAL_BASED_OUTPATIENT_CLINIC_OR_DEPARTMENT_OTHER)
Admission: EM | Admit: 2022-12-25 | Discharge: 2022-12-26 | Disposition: A | Discharge status: Home / Self Care | Payer: Medicare Other | Attending: Emergency Medicine | Admitting: Emergency Medicine

## 2022-12-25 VITALS — BP 119/84 | HR 95 | Temp 97.8°F | Resp 18 | Ht 74.0 in | Wt 195.8 lb

## 2022-12-25 DIAGNOSIS — G2581 Restless legs syndrome: Secondary | ICD-10-CM

## 2022-12-25 DIAGNOSIS — M79606 Pain in leg, unspecified: Secondary | ICD-10-CM | POA: Diagnosis not present

## 2022-12-25 DIAGNOSIS — R45851 Suicidal ideations: Secondary | ICD-10-CM | POA: Diagnosis not present

## 2022-12-25 DIAGNOSIS — R251 Tremor, unspecified: Secondary | ICD-10-CM | POA: Diagnosis not present

## 2022-12-25 DIAGNOSIS — F909 Attention-deficit hyperactivity disorder, unspecified type: Secondary | ICD-10-CM | POA: Diagnosis not present

## 2022-12-25 DIAGNOSIS — Z87891 Personal history of nicotine dependence: Secondary | ICD-10-CM | POA: Diagnosis not present

## 2022-12-25 DIAGNOSIS — Z79899 Other long term (current) drug therapy: Secondary | ICD-10-CM | POA: Diagnosis not present

## 2022-12-25 DIAGNOSIS — F411 Generalized anxiety disorder: Secondary | ICD-10-CM | POA: Diagnosis not present

## 2022-12-25 DIAGNOSIS — Y900 Blood alcohol level of less than 20 mg/100 ml: Secondary | ICD-10-CM | POA: Diagnosis not present

## 2022-12-25 DIAGNOSIS — F319 Bipolar disorder, unspecified: Secondary | ICD-10-CM | POA: Diagnosis not present

## 2022-12-25 DIAGNOSIS — F32A Depression, unspecified: Secondary | ICD-10-CM | POA: Diagnosis present

## 2022-12-25 DIAGNOSIS — R252 Cramp and spasm: Secondary | ICD-10-CM

## 2022-12-25 DIAGNOSIS — F332 Major depressive disorder, recurrent severe without psychotic features: Secondary | ICD-10-CM | POA: Insufficient documentation

## 2022-12-25 DIAGNOSIS — F419 Anxiety disorder, unspecified: Secondary | ICD-10-CM | POA: Diagnosis present

## 2022-12-25 LAB — BASIC METABOLIC PANEL
Anion gap: 8 (ref 5–15)
BUN: 6 mg/dL (ref 6–20)
CO2: 28 mmol/L (ref 22–32)
Calcium: 9.9 mg/dL (ref 8.9–10.3)
Chloride: 100 mmol/L (ref 98–111)
Creatinine, Ser: 0.95 mg/dL (ref 0.61–1.24)
GFR, Estimated: >60 mL/min (ref >60)
Glucose, Bld: 110 mg/dL — ABNORMAL HIGH (ref 70–99)
Potassium: 3.7 mmol/L (ref 3.5–5.1)
Sodium: 136 mmol/L (ref 135–145)

## 2022-12-25 LAB — CBC WITH DIFFERENTIAL/PLATELET
Basophils Absolute: 0 10*3/uL (ref 0.0–0.1)
Basophils Relative: 0.7 % (ref 0.0–3.0)
Eosinophils Absolute: 0.2 10*3/uL (ref 0.0–0.7)
Eosinophils Relative: 3 % (ref 0.0–5.0)
HCT: 42.5 % (ref 39.0–52.0)
Hemoglobin: 14.9 g/dL (ref 13.0–17.0)
Lymphocytes Relative: 20.6 % (ref 12.0–46.0)
Lymphs Abs: 1.5 10*3/uL (ref 0.7–4.0)
MCHC: 35.1 g/dL (ref 30.0–36.0)
MCV: 85 fl (ref 78.0–100.0)
Monocytes Absolute: 0.6 10*3/uL (ref 0.1–1.0)
Monocytes Relative: 8.6 % (ref 3.0–12.0)
Neutro Abs: 4.7 10*3/uL (ref 1.4–7.7)
Neutrophils Relative %: 67.1 % (ref 43.0–77.0)
Platelets: 282 10*3/uL (ref 150.0–400.0)
RBC: 5 Mil/uL (ref 4.22–5.81)
RDW: 13.7 % (ref 11.5–15.5)
WBC: 7.1 10*3/uL (ref 4.0–10.5)

## 2022-12-25 LAB — COMPREHENSIVE METABOLIC PANEL
ALT: 9 U/L (ref 0–53)
AST: 14 U/L (ref 0–37)
Albumin: 4.6 g/dL (ref 3.5–5.2)
Alkaline Phosphatase: 69 U/L (ref 39–117)
BUN: 9 mg/dL (ref 6–23)
CO2: 30 mEq/L (ref 19–32)
Calcium: 10.3 mg/dL (ref 8.4–10.5)
Chloride: 98 mEq/L (ref 96–112)
Creatinine, Ser: 0.93 mg/dL (ref 0.40–1.50)
GFR: 102.83 mL/min (ref >60.00)
Glucose, Bld: 83 mg/dL (ref 70–99)
Potassium: 4.4 mEq/L (ref 3.5–5.1)
Sodium: 136 mEq/L (ref 135–145)
Total Bilirubin: 0.5 mg/dL (ref 0.2–1.2)
Total Protein: 7.2 g/dL (ref 6.0–8.3)

## 2022-12-25 LAB — CBC
HCT: 41.3 % (ref 39.0–52.0)
Hemoglobin: 14.5 g/dL (ref 13.0–17.0)
MCH: 29.1 pg (ref 26.0–34.0)
MCHC: 35.1 g/dL (ref 30.0–36.0)
MCV: 82.9 fL (ref 80.0–100.0)
Platelets: 262 10*3/uL (ref 150–400)
RBC: 4.98 MIL/uL (ref 4.22–5.81)
RDW: 13 % (ref 11.5–15.5)
WBC: 7.6 10*3/uL (ref 4.0–10.5)
nRBC: 0 % (ref 0.0–0.2)

## 2022-12-25 LAB — MAGNESIUM: Magnesium: 1.7 mg/dL (ref 1.5–2.5)

## 2022-12-25 MED ORDER — CYCLOBENZAPRINE HCL 10 MG PO TABS
10.0000 mg | ORAL_TABLET | Freq: Once | ORAL | Status: AC
  Administered 2022-12-26: 10 mg via ORAL
  Filled 2022-12-25: qty 1

## 2022-12-25 MED ORDER — ROPINIROLE HCL 0.25 MG PO TABS: 0.2500 mg | ORAL_TABLET | Freq: Every day | ORAL | 1 refills | Status: DC

## 2022-12-25 NOTE — ED Triage Notes (Signed)
Pt via pov from home with tremors in both of his legs for 3 weeks. Pt states the tremors are "almost to the point of being seizures." He reports tremors are worse when he is lying down. Denies any other symptoms. Pt alert & oriented, nad noted.

## 2022-12-25 NOTE — ED Notes (Signed)
Called to room again for pt. To report his legs are shaking.ibuprofen told him again the Doctor will be here ASAP. Pt. Asked to sit in the chair because his" legs don't shake" when he's sitting in a chair, only when he's lying down.

## 2022-12-25 NOTE — Patient Instructions (Signed)
Symptoms suggesting restless leg. Starting with labs to check for underlying causes.  Trial of Requip - medication discussed and information sheet added to AVS Lifestyle measures: get adequate sleep, avoid caffeine and alcohol, get regular exercise, try warm bath, leg massage.

## 2022-12-25 NOTE — ED Notes (Signed)
When in to meet patient, pt.s' legs were lying quietly on the stretcher and did not start shaking until I asked him about his reason for being here. Just received an Rx for requip today.

## 2022-12-25 NOTE — ED Provider Notes (Signed)
[facility] Provider Note   CSN: [id number] Arrival date & time: 12/25/22  1834     History  Chief Complaint  Patient presents with   Tremors    Jesse Jones is a 42 y.o. male.  Patient is a 42 year old male with history of bipolar disorder, ADHD, major depression with recent admission for benzodiazepine overdose.  Patient presenting today with complaints of shaking of his legs.  He states that for the past several weeks he has had tremors in both legs whenever he lays down and tries to sleep.  This seems to improve when he is standing and walking, but returns when he lies down.  He reports he has not gotten sleep in the past several days because of this.  He tells me that he is also suicidal and having thoughts of killing himself.  He denies any drug or alcohol use.  He was prescribed Requip by his primary care provider earlier today, but has not had that prescription filled.  The history is provided by the patient.       Home Medications Prior to Admission medications   Medication Sig Start Date End Date Taking? Authorizing Provider  rOPINIRole (REQUIP) 0.25 MG tablet Take 1 tablet (0.25 mg total) by mouth at bedtime. 12/25/22  Yes Terrilyn Saver, NP  atomoxetine (STRATTERA) 25 MG capsule Take 1 capsule (25 mg total) by mouth 2 (two) times daily with a meal. 12/20/22   Winfred Leeds, Nadir, MD  clonazePAM (KLONOPIN) 0.5 MG tablet Take 1 tablet (0.5 mg total) by mouth 3 (three) times daily. 12/20/22   Dian Situ, MD  escitalopram (LEXAPRO) 20 MG tablet Take 1 tablet daily 12/20/22   Dian Situ, MD  fish oil-omega-3 fatty acids 1000 MG capsule Take 2 g by mouth daily.    [provider]  fluticasone (FLONASE) 50 MCG/ACT nasal spray SPRAY 2 SPRAYS INTO BOTH NOSTRILS DAILY AS NEEDED FOR ALLERGIES OR RHINITIS. Patient taking differently: Place 2 sprays into both nostrils daily as needed for allergies. 06/13/22   Mosie Lukes, MD   gabapentin (NEURONTIN) 300 MG capsule Take 1 capsule (300 mg total) by mouth 2 (two) times daily. 12/20/22   Dian Situ, MD  traZODone (DESYREL) 50 MG tablet Take 1 tablet (50 mg total) by mouth at bedtime. 12/20/22   Dian Situ, MD      Allergies    Haloperidol decanoate, Hydroxyzine, Other, Penicillins, and Valium    Review of Systems   Review of Systems  All other systems reviewed and are negative.   Physical Exam Updated Vital Signs BP (!) 131/103   Pulse (!) 107   Temp 98.2 F (36.8 C) (Oral)   Resp 18   Ht '6\' 2"'$  (1.88 m)   Wt 88.9 kg   SpO2 98%   BMI 25.16 kg/m  Physical Exam Vitals and nursing note reviewed.  Constitutional:      General: He is not in acute distress.    Appearance: He is well-developed. He is not diaphoretic.  HENT:     Head: Normocephalic and atraumatic.  Cardiovascular:     Rate and Rhythm: Normal rate and regular rhythm.     Heart sounds: No murmur heard.    No friction rub.  Pulmonary:     Effort: Pulmonary effort is normal. No respiratory distress.     Breath sounds: Normal breath sounds. No wheezing or rales.  Abdominal:     General: Bowel sounds are normal. There is  no distension.     Palpations: Abdomen is soft.     Tenderness: There is no abdominal tenderness.  Musculoskeletal:        General: Normal range of motion.     Cervical back: Normal range of motion and neck supple.  Skin:    General: Skin is warm and dry.  Neurological:     General: No focal deficit present.     Mental Status: He is alert and oriented to person, place, and time.     Cranial Nerves: No cranial nerve deficit.     Motor: No weakness.     Coordination: Coordination normal.     Comments: There is occasional shaking of both legs noted during the exam.  This appears to be voluntary.     ED Results / Procedures / Treatments   Labs (all labs ordered are listed, but only abnormal results are displayed) Labs Reviewed  BASIC METABOLIC PANEL - Abnormal;  Notable for the following components:      Result Value   Glucose, Bld 110 (*)    All other components within normal limits  CBC    EKG None  Radiology No results found.  Procedures Procedures  {Document cardiac monitor, telemetry assessment procedure when appropriate:1}  Medications Ordered in ED Medications  cyclobenzaprine (FLEXERIL) tablet 10 mg (has no administration in time range)    ED Course/ Medical Decision Making/ A&P   {   Click here for ABCD2, HEART and other calculatorsREFRESH Note before signing :1}                          Medical Decision Making Amount and/or Complexity of Data Reviewed Labs: ordered.  Risk Prescription drug management.   ***  {Document critical care time when appropriate:1} {Document review of labs and clinical decision tools ie heart score, Chads2Vasc2 etc:1}  {Document your independent review of radiology images, and any outside records:1} {Document your discussion with family members, caretakers, and with consultants:1} {Document social determinants of health affecting pt's care:1} {Document your decision making why or why not admission, treatments were needed:1} Final Clinical Impression(s) / ED Diagnoses Final diagnoses:  None    Rx / DC Orders ED Discharge Orders     None

## 2022-12-25 NOTE — Progress Notes (Signed)
Acute Office Visit  Subjective:     Patient ID: Jesse Jones, male    DOB: Nov 08, 1980, 42 y.o.   MRN: [id number]  Chief Complaint  Patient presents with   Tremors    2-3 weeks mainly when laying down, in his legs    HPI Patient is in today for leg tremors, cramping.   Patient states that for the past several weeks, he has been having leg tremoring/shaking with some occasional cramping. Symptoms primarily occur when he is trying to sleep. Symptoms have been severe enough to prevent him for sleeping good the past few nights. States he cannot get comfortable because of the shaking. Reports minimal caffeine intake - one cup of coffee in the morning. 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 Denies any numbness, tingling, pain, incontinence, edema, skin changes.    Review of Systems  Constitutional:  Negative for chills and fever.  Eyes:  Negative for blurred vision and double vision.  Cardiovascular:  Negative for chest pain and palpitations.  Gastrointestinal:  Negative for abdominal pain, constipation, diarrhea, nausea and vomiting.  Genitourinary:  Negative for frequency and urgency.  Musculoskeletal:  Negative for back pain, myalgias and neck pain.  Neurological:  Positive for tremors. Negative for dizziness, tingling, focal weakness, seizures, loss of consciousness, weakness and headaches.  Psychiatric/Behavioral:  The patient has insomnia.    All review of systems negative except what is listed in the HPI      Objective:    BP 119/84 (BP Location: Left Arm, Patient Position: Sitting, Cuff Size: Normal)   Pulse 95   Temp 97.8 F (36.6 C) (Oral)   Resp 18   Ht '6\' 2"'$  (1.88 m)   Wt 195 lb 12.8 oz (88.8 kg)   SpO2 100%   BMI 25.14 kg/m    Physical Exam Vitals reviewed.  Constitutional:      Appearance: Normal appearance. He is normal weight.  Cardiovascular:     Rate and Rhythm: Normal rate and regular rhythm.  Pulmonary:     Effort: Pulmonary effort is normal.      Breath sounds: Normal breath sounds.  Musculoskeletal:        General: No swelling, tenderness or deformity. Normal range of motion.     Right lower leg: No edema.     Left lower leg: No edema.  Skin:    General: Skin is warm and dry.  Neurological:     General: No focal deficit present.     Mental Status: He is alert and oriented to person, place, and time. Mental status is at baseline.     Motor: No weakness.     Gait: Gait normal.  Psychiatric:        Mood and Affect: Mood normal.        Behavior: Behavior normal.        Thought Content: Thought content normal.        Judgment: Judgment normal.     No results found for any visits on 12/25/22.      Assessment & Plan:   Problem List Items Addressed This Visit   None Visit Diagnoses     Restless leg    -  Primary   Relevant Medications   rOPINIRole (REQUIP) 0.25 MG tablet   Other Relevant Orders   CBC with Differential/Platelet   Comprehensive metabolic panel   Magnesium   Leg cramping       Relevant Orders   CBC with Differential/Platelet   Comprehensive metabolic  panel   Magnesium      Symptoms suggesting restless leg. Starting with labs to check for underlying causes.  Trial of Requip - medication discussed and information sheet added to AVS Lifestyle measures: get adequate sleep, avoid caffeine and alcohol, get regular exercise, try warm bath, leg massage. Patient aware of signs/symptoms requiring further/urgent evaluation.   Please contact office for follow-up if symptoms do not improve or worsen. Seek emergency care if symptoms become severe.        Meds ordered this encounter  Medications   rOPINIRole (REQUIP) 0.25 MG tablet    Sig: Take 1 tablet (0.25 mg total) by mouth at bedtime.    Dispense:  30 tablet    Refill:  1    Order Specific Question:   Supervising Provider    Answer:   Penni Homans A [[id number]]    Return if symptoms worsen or fail to improve.  Terrilyn Saver, NP

## 2022-12-26 DIAGNOSIS — F411 Generalized anxiety disorder: Secondary | ICD-10-CM | POA: Diagnosis not present

## 2022-12-26 DIAGNOSIS — F3132 Bipolar disorder, current episode depressed, moderate: Secondary | ICD-10-CM | POA: Diagnosis not present

## 2022-12-26 DIAGNOSIS — F319 Bipolar disorder, unspecified: Secondary | ICD-10-CM | POA: Diagnosis not present

## 2022-12-26 DIAGNOSIS — F902 Attention-deficit hyperactivity disorder, combined type: Secondary | ICD-10-CM | POA: Diagnosis not present

## 2022-12-26 LAB — URINALYSIS, ROUTINE W REFLEX MICROSCOPIC
Bilirubin Urine: NEGATIVE
Glucose, UA: NEGATIVE mg/dL
Hgb urine dipstick: NEGATIVE
Ketones, ur: NEGATIVE mg/dL
Leukocytes,Ua: NEGATIVE
Nitrite: NEGATIVE
Protein, ur: NEGATIVE mg/dL
Specific Gravity, Urine: 1.009 (ref 1.005–1.030)
pH: 5.5 (ref 5.0–8.0)

## 2022-12-26 LAB — RAPID URINE DRUG SCREEN, HOSP PERFORMED
Amphetamines: NOT DETECTED
Barbiturates: NOT DETECTED
Benzodiazepines: NOT DETECTED
Cocaine: NOT DETECTED
Opiates: NOT DETECTED
Tetrahydrocannabinol: NOT DETECTED

## 2022-12-26 LAB — ETHANOL: Alcohol, Ethyl (B): 14 mg/dL — ABNORMAL HIGH (ref <10)

## 2022-12-26 MED ORDER — ALPRAZOLAM 0.5 MG PO TABS
0.5000 mg | ORAL_TABLET | Freq: Once | ORAL | Status: AC
  Administered 2022-12-26: 0.5 mg via ORAL
  Filled 2022-12-26: qty 1

## 2022-12-26 MED ORDER — CLONAZEPAM 0.5 MG PO TABS: 0.5000 mg | ORAL_TABLET | Freq: Three times a day (TID) | ORAL | Status: DC

## 2022-12-26 MED ORDER — CYCLOBENZAPRINE HCL 10 MG PO TABS
10.0000 mg | ORAL_TABLET | Freq: Once | ORAL | Status: AC
  Administered 2022-12-26: 10 mg via ORAL
  Filled 2022-12-26: qty 1

## 2022-12-26 MED ORDER — LORAZEPAM 1 MG PO TABS
1.0000 mg | ORAL_TABLET | Freq: Once | ORAL | Status: AC
  Administered 2022-12-26: 1 mg via ORAL
  Filled 2022-12-26: qty 1

## 2022-12-26 NOTE — ED Notes (Signed)
Pt. Placed in paper scrubs, room emptied of all lines and equipment, belongings secured after pt. Voiced suicidal ideations to MD.

## 2022-12-26 NOTE — ED Notes (Signed)
Pt's dad here to pick up. Reviewed discharge instructions and follow up w/ pt's father. Pt has psychiatry appointment today at 3pm and follow up tomorrow at 1pm.

## 2022-12-26 NOTE — ED Notes (Signed)
Pt. Has been resting with eyes closed, resps even and unlabored. No tremors noted.

## 2022-12-26 NOTE — Discharge Instructions (Addendum)
You are scheduled for an assessment for the PHP on Thursday, 2/29 at 1:00 pm. This appointment will last approximately one hour and will be virtual via Webex. Please download the [employer] app prior to the appointment. If you need to cancel or reschedule, please call [phone] and leave a voicemail with your name, date of birth, and phone number.   Patient is instructed prior to discharge to:  Take all medications as prescribed by his/her mental healthcare provider. Report any adverse effects and or reactions from the medicines to his/her outpatient provider promptly. Keep all scheduled appointments, to ensure that you are getting refills on time and to avoid any interruption in your medication.  If you are unable to keep an appointment call to reschedule.  Be sure to follow-up with resources and follow-up appointments provided.  Patient has been instructed & cautioned: To not engage in alcohol and or illegal drug use while on prescription medicines. In the event of worsening symptoms, patient is instructed to call the crisis hotline, 911 and or go to the nearest ED for appropriate evaluation and treatment of symptoms. To follow-up with his/her primary care provider for your other medical issues, concerns and or health care needs.  Information: -National Suicide Prevention Lifeline 1-800-SUICIDE or 484-782-5628.  -988 offers 24/7 access to trained crisis counselors who can help people experiencing mental health-related distress. People can call or text 988 or chat 988lifeline.org for themselves or if they are worried about a loved one who may need crisis support.     Base on the information you have provided and the presenting issue, outpatient services and resources for have been recommended.  It is imperative that you follow through with treatment recommendations within 5-7 days from the of discharge to mitigate further risk to your safety and mental well-being. A list of referrals has been  provided below to get you started.  You are not limited to the list provided.  In case of an urgent crisis, you may contact the Mobile Crisis Unit with [facility] at 1.(403)422-8363.   Patient has been linked with the [facility] PHP Program with Moses Manners, LCSW.    Patient is scheduled for an assessment for the PHP on Thursday, 2/29 at 1:00 pm. This appointment will last approximately one hour and will be virtual via Webex. Please download the [employer] app prior to the appointment. If you need to cancel or reschedule, please call [phone] and leave a voicemail with your name, date of birth, and phone number.

## 2022-12-26 NOTE — ED Notes (Signed)
Pt wanded by security. 

## 2022-12-26 NOTE — Consult Note (Signed)
Telepsych Consultation   Reason for Consult: Suicidal thoughts Referring Physician: Dr. Truett Mainland Location of Patient: [facility] emergency department Location of Provider: [facility]  Patient Identification: Jesse Jones MRN:  [id number] Principal Diagnosis: Anxiety Diagnosis:  Principal Problem:   Anxiety   Total Time spent with patient: 30 minutes  Subjective:   Jesse Jones is a 42 y.o. male patient admitted with complaint of restless and tremulous legs and suicidal ideation.  Patient states "I am ready to go, I am going to see my psychiatrist, Moshe Salisbury, later today."  HPI: Patient reassessed by this nurse practitioner virtually, via telepsychiatry monitor.  Chart reviewed and patient discussed with Dr. Hampton Abbot.  He is seated on hospital stretcher, no apparent distress.  He is alert and oriented, pleasant and cooperative during assessment.  He presents with anxious mood, congruent affect.  Patient reports he initially presented to the emergency department because he had not been sleeping well for 3 weeks related to feeling that his legs are tremulous.  Patient discharged from [facility] after inpatient psychiatric treatment 1 week ago.  Patient reports his medications have been updated by inpatient provider at [facility].  He would like to follow-up with his outpatient team to review medications, specifically recent update from alprazolam to clonazepam.  Patient reports he is compliant with medications, his father assist with medication management.  Patient denies suicidal and homicidal ideations.  He easily contracts verbally for safety with this Probation officer.  He denies auditory and visual hallucinations.  There is no evidence of delusional thought content and no indication that patient is responding to internal stimuli.  He denies symptoms of paranoia.  Diagnoses include anxiety, ADHD, nicotine dependence, alcohol abuse  in remission, bipolar disorder, generalized anxiety disorder and intentional overdose.  He is followed by outpatient psychiatry, Elberta Leatherwood, at [facility].  He is not linked with counseling currently.  Reviewed plan to follow-up with intensive outpatient or partial hospitalization program, he verbalized understanding.  He has not believed that individual therapy was therapeutic in the past, reviewed group therapy options.  He endorses history of multiple previous inpatient psychiatric hospitalizations.  No family mental health history reported.  Jesse Jones resides alone in [place].  He denies access to weapons.  His parents visits frequently and manage his medications.  He receives disability income.  He denies alcohol and substance use.  He endorses average appetite.   Patient offered support and encouragement.  He gives verbal consent to speak with his father, Qusay Kovack phone number [phone].  Spoke with patient's father who will pick up patient and transport to outpatient psychiatry appointment later this date.  Patient's father also attends patient's appointments.  Patient has missed used his alprazolam and Adderall in the past.  Patient's parents to allow his medications and 7-day or less packets in order to decrease patient's misuse of benzodiazepine medications.  Patient's father verbalizes understanding of strict return precautions.  Patient's father confirms there are no weapons in home patient's father is also removed any sharps or other dangerous items from patient's home.  Patient's father will visit patient's home prior to discharge.    Patient and family are educated and verbalize understanding of mental health resources and other crisis services in the community. They are instructed to call 911 and present to the nearest emergency room should patient experience any suicidal/homicidal ideation, auditory/visual/hallucinations, or detrimental worsening of mental health condition.       Past Psychiatric History:  ADHD,  alcohol abuse in remission, intentional overdose, severe bipolar 1 disorder, generalized anxiety disorder, benzodiazepine dependence, insomnia  Risk to Self:  Denies Risk to Others:  Denies Prior Inpatient Therapy:  Currently followed at [facility] Prior Outpatient Therapy:  Multiple admissions, most recently approximately 1 week ago at [facility] behavioral health  Past Medical History:  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   Depression    ED (erectile dysfunction) 05/12/2012   Hyperlipidemia, mild 04/18/2015   Insomnia    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  He easily contracts verbally for safety with this Probation officer. Social History:  Social History   Substance and Sexual Activity  Alcohol Use Yes   Alcohol/week: 36.0 standard drinks of alcohol   Types: 36 Cans of beer per week   Comment: sober x51yr No alcohol.     Social History   Substance and Sexual Activity  Drug Use Yes   Types: Marijuana, Other-see comments   Comment: pt stopped 4 year ago    Social History   Socioeconomic History   Marital status:  Single    Spouse name: Not on file   Number of children: 0   Years of education: 12   Highest education level: 12th grade  Occupational History   Not on file  Tobacco Use   Smoking status: Former    Packs/day: 0.30    Years: 20.00    Total pack years: 6.00    Types: Cigarettes    Passive exposure: Current   Smokeless tobacco: Never   Tobacco comments:    Discussed Smoking Cessation and Alcohol and Drug Services  Vaping Use   Vaping Use: Never used  Substance and Sexual Activity   Alcohol use: Yes    Alcohol/week: 36.0 standard drinks of alcohol    Types: 36 Cans of beer per week    Comment: sober x453yrNo alcohol.   Drug use: Yes    Types: Marijuana, Other-see comments    Comment: pt stopped 4 year ago   Sexual activity: Yes    Partners: Female    Comment: lives alone and eating well. exercising  Other Topics Concern   Not on file  Social History Narrative   Not on file   Social Determinants of Health   Financial Resource Strain: Low Risk  (03/02/2022)   Overall Financial Resource Strain (CARDIA)    Difficulty of Paying Living Expenses: Not hard at all  Food Insecurity: No  Food Insecurity (12/08/2022)   Hunger Vital Sign    Worried About Running Out of Food in the Last Year: Never true    Ran Out of Food in the Last Year: Never true  Transportation Needs: No Transportation Needs (12/08/2022)   PRAPARE - Hydrologist (Medical): No    Lack of Transportation (Non-Medical): No  Physical Activity: Inactive (03/02/2022)   Exercise Vital Sign    Days of Exercise per Week: 0 days    Minutes of Exercise per Session: 0 min  Stress: Stress Concern Present (03/02/2022)   [place]    Feeling of Stress : Very much  Social Connections: Socially Isolated (03/02/2022)   Social Connection and Isolation Panel [NHANES]    Frequency of Communication with Friends and Family: More than three times  a week    Frequency of Social Gatherings with Friends and Family: More than three times a week    Attends Religious Services: Never    Marine scientist or Organizations: No    Attends Archivist Meetings: Never    Marital Status: Never married   Additional Social History:    Allergies:   Allergies  Allergen Reactions   Haloperidol Decanoate    Hydroxyzine Anxiety    EXCESSIVE ANXIOUNESS   Other Other (See Comments)    Reverse effect, super hyper   Penicillins Hives   Valium     Labs:  Results for orders placed or performed during the hospital encounter of 12/25/22 (from the past 48 hour(s))  Basic metabolic panel     Status: Abnormal   Collection Time: 12/25/22  7:33 PM  Result Value Ref Range   Sodium 136 135 - 145 mmol/L   Potassium 3.7 3.5 - 5.1 mmol/L   Chloride 100 98 - 111 mmol/L   CO2 28 22 - 32 mmol/L   Glucose, Bld 110 (H) 70 - 99 mg/dL    Comment: Glucose reference range applies only to samples taken after fasting for at least 8 hours.   BUN 6 6 - 20 mg/dL   Creatinine, Ser 0.95 0.61 - 1.24 mg/dL   Calcium 9.9 8.9 - 10.3 mg/dL   GFR, Estimated >60 >60 mL/min    Comment: (NOTE) Calculated using the CKD-EPI Creatinine Equation (2021)    Anion gap 8 5 - 15    Comment: Performed at [facility], [address] [place], [place] [address]  CBC     Status: None   Collection Time: 12/25/22  7:33 PM  Result Value Ref Range   WBC 7.6 4.0 - 10.5 K/uL   RBC 4.98 4.22 - 5.81 MIL/uL   Hemoglobin 14.5 13.0 - 17.0 g/dL   HCT 41.3 39.0 - 52.0 %   MCV 82.9 80.0 - 100.0 fL   MCH 29.1 26.0 - 34.0 pg   MCHC 35.1 30.0 - 36.0 g/dL   RDW 13.0 11.5 - 15.5 %   Platelets 262 150 - 400 K/uL   nRBC 0.0 0.0 - 0.2 %    Comment: Performed at [facility], [address] [place], [place] [address]  Ethanol     Status: Abnormal   Collection Time: 12/26/22 12:19 AM  Result Value Ref Range   Alcohol, Ethyl (B) 14 (H) <10 mg/dL     Comment: (NOTE) Lowest detectable limit for serum alcohol is 10 mg/dL.  For medical purposes only. Performed at [facility], [address], [place], [place] [address]   Urinalysis, Routine w reflex microscopic -Urine, Clean Catch     Status: None   Collection Time: 12/26/22 12:32 AM  Result Value Ref Range   Color, Urine YELLOW YELLOW   APPearance CLEAR CLEAR   Specific Gravity, Urine 1.009 1.005 - 1.030   pH 5.5 5.0 - 8.0   Glucose, UA NEGATIVE NEGATIVE mg/dL   Hgb urine dipstick NEGATIVE NEGATIVE   Bilirubin Urine NEGATIVE NEGATIVE   Ketones, ur NEGATIVE NEGATIVE mg/dL   Protein, ur NEGATIVE NEGATIVE mg/dL   Nitrite NEGATIVE NEGATIVE   Leukocytes,Ua NEGATIVE NEGATIVE    Comment: Performed at [facility], [address] [place], [place] [address]  Urine rapid drug screen (hosp performed)     Status: None   Collection Time: 12/26/22 12:32 AM  Result Value Ref Range   Opiates NONE DETECTED NONE DETECTED   Cocaine NONE DETECTED NONE DETECTED   Benzodiazepines NONE DETECTED NONE DETECTED   Amphetamines NONE DETECTED NONE DETECTED   Tetrahydrocannabinol NONE DETECTED NONE DETECTED   Barbiturates NONE DETECTED NONE DETECTED    Comment: (NOTE) DRUG SCREEN FOR MEDICAL PURPOSES ONLY.  IF CONFIRMATION IS NEEDED FOR ANY PURPOSE, NOTIFY LAB WITHIN 5 DAYS.  LOWEST DETECTABLE LIMITS FOR URINE DRUG SCREEN Drug Class                     Cutoff (ng/mL) Amphetamine and metabolites    1000 Barbiturate and metabolites    200 Benzodiazepine                 200 Opiates and metabolites        300 Cocaine and metabolites        300 THC                            50 Performed at [facility], [address] [place], [place] [address]     Medications:  No current facility-administered medications for this encounter.   Current Outpatient Medications  Medication Sig Dispense Refill   rOPINIRole (REQUIP) 0.25 MG tablet  Take 1 tablet (0.25 mg total) by mouth at bedtime. 30 tablet 1   atomoxetine (STRATTERA) 25 MG capsule Take 1 capsule (25 mg total) by mouth 2 (two) times daily with a meal. 60 capsule 0   clonazePAM (KLONOPIN) 0.5 MG tablet Take 1 tablet (0.5 mg total) by mouth 3 (three) times daily. 30 tablet 0   escitalopram (LEXAPRO) 20 MG tablet Take 1 tablet daily 30 tablet 0   fish oil-omega-3 fatty acids 1000 MG capsule Take 2 g by mouth daily.     fluticasone (FLONASE) 50 MCG/ACT nasal spray SPRAY 2 SPRAYS INTO BOTH NOSTRILS DAILY AS NEEDED FOR ALLERGIES OR RHINITIS. (Patient taking differently: Place 2 sprays into both nostrils daily as needed for allergies.) 48 mL 2   gabapentin (NEURONTIN) 300 MG capsule Take 1 capsule (300 mg total) by mouth 2 (two) times daily. 60 capsule 0   traZODone (DESYREL) 50 MG tablet Take 1 tablet (50 mg total) by mouth at bedtime. 30 tablet 0    Musculoskeletal: Strength & Muscle Tone: within normal limits Gait & Station: normal Patient leans: N/A          Psychiatric Specialty Exam:  Presentation  General Appearance:  Appropriate for Environment; Casual  Eye Contact: Good  Speech: Clear and Coherent; Normal Rate  Speech Volume: Normal  Handedness: Right   Mood  and Affect  Mood: Euthymic  Affect: Congruent   Thought Process  Thought Processes: Coherent; Goal Directed; Linear  Descriptions of Associations:Intact  Orientation:Full (Time, Place and Person)  Thought Content:Logical; WDL  History of Schizophrenia/Schizoaffective disorder:No  Duration of Psychotic Symptoms:No data recorded Hallucinations:Hallucinations: None  Ideas of Reference:None  Suicidal Thoughts:Suicidal Thoughts: No  Homicidal Thoughts:Homicidal Thoughts: No   Sensorium  Memory: Immediate Fair  Judgment: Intact  Insight: Fair   Community education officer  Concentration: Good  Attention Span: Good  Recall: Good  Fund of  Knowledge: Fair  Language: Fair   Psychomotor Activity  Psychomotor Activity:Psychomotor Activity: Restlessness   Assets  Assets: Communication Skills; Desire for Improvement; Financial Resources/Insurance; Housing; Resilience; Social Support   Sleep  Sleep:Sleep: Poor    Physical Exam: Physical Exam Vitals and nursing note reviewed.  Constitutional:      Appearance: Normal appearance. He is normal weight.  HENT:     Head: Normocephalic and atraumatic.     Nose: Nose normal.  Cardiovascular:     Rate and Rhythm: Normal rate.  Pulmonary:     Effort: Pulmonary effort is normal.  Musculoskeletal:        General: Normal range of motion.     Cervical back: Normal range of motion.  Neurological:     Mental Status: He is alert and oriented to person, place, and time.  Psychiatric:        Attention and Perception: Attention and perception normal.        Mood and Affect: Affect normal. Mood is anxious.        Speech: Speech normal.        Behavior: Behavior normal. Behavior is cooperative.        Thought Content: Thought content normal.        Cognition and Memory: Cognition normal.   Review of Systems  Constitutional: Negative.   HENT: Negative.    Eyes: Negative.   Respiratory: Negative.    Cardiovascular: Negative.   Gastrointestinal: Negative.   Genitourinary: Negative.   Musculoskeletal: Negative.   Skin: Negative.   Neurological:  Positive for tremors.       Patient reports leg tremor x 3 weeks  Psychiatric/Behavioral:  The patient is nervous/anxious.    Blood pressure 117/83, pulse 86, temperature 98 F (36.7 C), temperature source Oral, resp. rate 17, height '6\' 2"'$  (1.88 m), weight 88.9 kg, SpO2 98 %. Body mass index is 25.16 kg/m.  Treatment Plan Summary: Plan follow up with established outpatient psychiatry provider later this date.  Follow-up with therapy options provided.  You have been referred for intensive outpatient and partial hospitalization  programs.  See appointment and discharge instructions, scheduled for tomorrow. Continue current medications including: -Gabapentin 300 mg twice daily -Strattera 25 mg twice daily -Lexapro 20 mg daily -Clonazepam 0.5 mg 3 times daily (current plan includes outpatient tapering this medication) -Hydroxyzine 25 mg 3 times daily as needed/anxiety   Disposition: No evidence of imminent risk to self or others at present.   Patient does not meet criteria for psychiatric inpatient admission. Supportive therapy provided about ongoing stressors. Refer to IOP. Discussed crisis plan, support from social network, calling 911, coming to the Emergency Department, and calling Suicide Hotline.  This service was provided via telemedicine using a 2-way, interactive audio and video technology.  Names of all persons participating in this telemedicine service and their role in this encounter. Name: Karsyn Stiltz Role: Patient  Name: Johna Sheriff  Role: Patient's father  Name: Beatriz Stallion  Role: NP  Name:Dr Scheving Role: Attending provider  Name: Dr Hampton Abbot Role: [place], [place] 12/26/2022 10:19 AM

## 2022-12-26 NOTE — ED Provider Notes (Signed)
Patient seen by psychiatry team and cleared for discharge. Father will pick up patient. He has a PHP appointment tomorrow and will see his psychiatrist today. Denies ongoing SI      Cristie Hem, MD 12/26/22 [phone]

## 2022-12-26 NOTE — ED Notes (Signed)
Pt. Talking to social worker via TTS

## 2022-12-26 NOTE — ED Notes (Signed)
RN updated pt's mother regarding plan of care. RN to update pt's mom when disposition is decided.

## 2022-12-26 NOTE — ED Notes (Signed)
All cords removed from room. Pt dressed in purple scrubs and personal belongings locked in cabinet behind nurses station. Security called to wand pt.   Patient belongings: Shoes/Pants/Shirt/Socks Phone Necklace/Ring

## 2022-12-26 NOTE — ED Notes (Signed)
Pt and pt's father updated on disposition, father to come pick up pt upon discharge.

## 2022-12-26 NOTE — ED Notes (Addendum)
TTS will now be speaking with the patient; RN informed via secure chat

## 2022-12-26 NOTE — BH Assessment (Signed)
Comprehensive Clinical Assessment (CCA) Note  12/26/2022 [name] [id number]  Disposition: Randon Goldsmith recommends overnight observation, to be reassessed face-to-face in the morning. Patient not safe for discharge at this time.   The patient demonstrates the following risk factors for suicide: Chronic risk factors for suicide include: previous suicide attempts by overdose . Acute risk factors for suicide include: family or marital conflict. Protective factors for this patient include: hope for the future. Considering these factors, the overall suicide risk at this point appears to be moderate. Patient is not appropriate for outpatient follow up.  Jesse Jones is a 42 y.o. single male who presents voluntarily to [facility] for leg tremors. Patient then reported suicidal ideations with no plan. Patient reports a diagnosis of bipolar, depression and anxiety. Patient states he has been having leg tremors for two to three weeks, which has interfered with his sleep. He is unable to identify a cause for the tremors. Additionally, he endorses feelings of depression for the past 6 months. Patient acknowledges symptoms including isolation, lack of energy and hopelessness. Patient also reports anxiety with symptoms including restlessness and sweating. Patient states his lack of sleep has led to visual hallucinations of colors. Patient was hospitalized 12/08/22-12/20/22 due to a suicide attempt by overdose. When asked what happened following the hospitalization, patient states he's just "over it." Patient states he also attempted suicide at age 42 by drinking gasoline and whiskey. Patient denies HI or auditory hallucinations. Patient denies substance use .  Patient identifies his stressors as the leg tremors and falling out with his dad. Patient lives alone and receives disability. Patient identifies his parents as his main supports. He denies a history of abuse or current legal problems.    Patient states he is seeing a psychiatrist [name] who manages his medications, which he finds to be helpful. He says he saw a therapist for a short time about 3 months ago and did not find it beneficial.   Patient is dressed in scrubs, alert and oriented with normal speech. He makes good eye contact and there is no indication he is responding to internal stimuli. Patient's thought process is coherent and his insight is good. Patient is cooperative throughout the assessment. He initially reports he that he would be safe to return home, however when asked how he believes he could be helped, it states "I need inpatient treatment so I'm not at home alone."  Chief Complaint:  Chief Complaint  Patient presents with   Tremors   Suicidal   Visit Diagnosis:  Major depressive disorder, recurrent, severe without psychotic features     CCA Screening, Triage and Referral (STR)  Patient Reported Information How did you hear about [place]? Self  What Is the Reason for Your Visit/Call Today? [name] is a 41 y.o. single male who presents voluntarily to [facility] due to leg tremors. He later expressed suicidal ideations with no plan. Patient diagnosed bipolar, MDD and anxiety. Patient was hosptial[facility] at [facility] 12/08/22-12/20/22. Patient denies HI, auditory or visual hallucinations.  How Long Has This Been Causing You Problems? 1-6 months  What Do You Feel Would Help You the Most Today? Treatment for Depression or other mood problem   Have You Recently Had Any Thoughts About Hurting Yourself? Yes  Are You Planning to Commit Suicide/Harm Yourself At This time? No   Flowsheet Row ED from 12/25/2022 in [facility] Emergency Department at [facility] Admission (Discharged) from 12/08/2022 in [facility] ED  from 12/06/2022 in [facility] Emergency Department at [place] Moderate Risk Low Risk High Risk       Have you  Recently Had Thoughts About [place]? No  Are You Planning to Harm Someone at This Time? No  Explanation: N/A   Have You Used Any Alcohol or Drugs in the Past 24 Hours? No  What Did You Use and How Much? N/A   Do You Currently Have a Therapist/Psychiatrist? Yes  Name of Therapist/Psychiatrist: Name of Therapist/Psychiatrist: [name]   Have You Been Recently Discharged From Any Office Practice or Programs? Yes  Explanation of Discharge From Practice/Program: Patient admitted to [facility] 12/08/22-12/20/22.     CCA Screening Triage Referral Assessment Type of Contact: Tele-Assessment  Telemedicine Service Delivery: Telemedicine service delivery: This service was provided via telemedicine using a 2-way, interactive audio and video technology  Is this Initial or Reassessment? Is this Initial or Reassessment?: Initial Assessment  Date Telepsych consult ordered in [facility]:  Date Telepsych consult ordered in [facility]: 12/26/22  Time Telepsych consult ordered in [facility]:  Time Telepsych consult ordered in [facility]: 0139  Location of Assessment: Other (comment) ([facility])  Provider Location: [facility] Assessment Services   Collateral Involvement: None   Does Patient Have a Stage manager Guardian? No  Legal Guardian Contact Information: N/A  Copy of Legal Guardianship Form: -- (N/A)  Legal Guardian Notified of Arrival: -- (N/A)  Legal Guardian Notified of Pending Discharge: -- (N/A)  If Minor and Not Living with Parent(s), Who has Custody? N/A  Is CPS involved or ever been involved? Never  Is APS involved or ever been involved? Never   Patient Determined To Be At Risk for Harm To Self or Others Based on Review of Patient Reported Information or Presenting Complaint? Yes, for Self-Harm  Method: No Plan (Denies HI)  Availability of Means: No access or NA (Denies HI)  Intent: Vague intent or NA (Denies HI)  Notification Required: No need or identified person  (Denies HI)  Additional Information for Danger to Others Potential: Previous attempts (Previous SI)  Additional Comments for Danger to Others Potential: N/A  Are There Guns or Other Weapons in Your Home? No  Types of Guns/Weapons: N/A  Are These Weapons Safely Secured?                            -- (N/A)  Who Could Verify You Are Able To Have These Secured: N/A  Do You Have any Outstanding Charges, Pending Court Dates, Parole/Probation? No  Contacted To Inform of Risk of Harm To Self or Others: -- (N/A)    Does Patient Present under Involuntary Commitment? No    [place] of Residence: [place]   Patient Currently Receiving the Following Services: Medication Management   Determination of Need: Urgent (48 hours)   Options For Referral: Inpatient Hospitalization; Intensive Outpatient Therapy     CCA Biopsychosocial Patient Reported Schizophrenia/Schizoaffective Diagnosis in Past: No   Strengths: Patient is friendly   Mental Health Symptoms Depression:   Hopelessness; Worthlessness; Change in energy/activity (Isolation)   Duration of Depressive symptoms: Duration of Depressive Symptoms: Greater than two weeks   Mania:   None   Anxiety:    Restlessness   Psychosis:   None   Duration of Psychotic symptoms:    Trauma:   None   Obsessions:   None   Compulsions:   None   Inattention:  None   Hyperactivity/Impulsivity:   None   Oppositional/Defiant Behaviors:   None   Emotional Irregularity:   None   Other Mood/Personality Symptoms:   N/A    Mental Status Exam Appearance and self-care  Stature:   Tall   Weight:   Average weight   Clothing:   -- (Scrubs)   Grooming:   Normal   Cosmetic use:   None   Posture/gait:   Normal   Motor activity:   Not Remarkable   Sensorium  Attention:   Normal   Concentration:   Normal   Orientation:   X5   Recall/memory:   Normal   Affect and Mood  Affect:   Flat   Mood:    Depressed   Relating  Eye contact:   Normal   Facial expression:   Constricted   Attitude toward examiner:   Cooperative   Thought and Language  Speech flow:  Normal   Thought content:   Appropriate to Mood and Circumstances   Preoccupation:   None   Hallucinations:   Visual   Organization:   Linear   Transport planner of Knowledge:   Average   Intelligence:   Average   Abstraction:   Normal   Judgement:   Impaired   Reality Testing:   Adequate   Insight:   Fair   Decision Making:   Normal   Social Functioning  Social Maturity:   Responsible   Social Judgement:   Normal   Stress  Stressors:   Family conflict   Coping Ability:   Programme researcher, broadcasting/film/video Deficits:   None   Supports:   Family     Religion: Religion/Spirituality Are You A Religious Person?: Yes What is Your Religious Affiliation?: Catholic How Might This Affect Treatment?: N/A  Leisure/Recreation: Leisure / Recreation Do You Have Hobbies?: Yes Leisure and Hobbies: Reading, art, and Xbox  Exercise/Diet: Exercise/Diet Do You Exercise?: No Have You Gained or Lost A Significant Amount of Weight in the Past Six Months?: No Do You Follow a Special Diet?: No Do You Have Any Trouble Sleeping?: Yes Explanation of Sleeping Difficulties: Patient reports no sleep in recent weeks   CCA Employment/Education Employment/Work Situation: Employment / Work Situation Employment Situation: On disability 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 How Long has Patient Been on Disability: 6-7 years Patient's Job has Been Impacted by Current Illness: No Has Patient ever Been in the [employer]?: No  Education: Education Is Patient Currently Attending School?: No Last Grade Completed: 12 Did You Attend College?: No Did You Have An Individualized Education Program (IIEP): No Did You Have Any Difficulty At School?: No Patient's Education Has Been  Impacted by Current Illness: No   CCA Family/Childhood History Family and Relationship History: Family history Marital status: Single Does patient have children?: No  Childhood History:  Childhood History By whom was/is the patient raised?: Both parents Description of patient's current relationship with siblings: N/A Did patient suffer any verbal/emotional/physical/sexual abuse as a child?: No Did patient suffer from severe childhood neglect?: No Has patient ever been sexually abused/assaulted/raped as an adolescent or adult?: No Was the patient ever a victim of a crime or a disaster?: No Witnessed domestic violence?: No Has patient been affected by domestic violence as an adult?: No       CCA Substance Use Alcohol/Drug Use: Alcohol / Drug Use Pain Medications: See MAR Prescriptions: See MAR Over the Counter: See MAR History of alcohol /  drug use?: No history of alcohol / drug abuse Longest period of sobriety (when/how long): N/A Negative Consequences of Use:  (N/A) Withdrawal Symptoms:  (N/A)                         ASAM's:  Six Dimensions of Multidimensional Assessment  Dimension 1:  Acute Intoxication and/or Withdrawal Potential:      Dimension 2:  Biomedical Conditions and Complications:      Dimension 3:  Emotional, Behavioral, or Cognitive Conditions and Complications:     Dimension 4:  Readiness to Change:     Dimension 5:  Relapse, Continued use, or Continued Problem Potential:     Dimension 6:  Recovery/Living Environment:     ASAM Severity Score:    ASAM Recommended Level of Treatment:     Substance use Disorder (SUD)    Recommendations for Services/Supports/Treatments:    Discharge Disposition:    DSM5 Diagnoses: Patient Active Problem List   Diagnosis Date Noted   Severe bipolar I disorder, current or most recent episode depressed ([place]) 12/10/2022   GAD (generalized anxiety disorder) 12/10/2022   Benzodiazepine dependence ([place])  12/10/2022   Insomnia 12/10/2022   Intentional overdose ([place]) 12/07/2022   Ingrown toenail of right foot 07/19/2021   LOM (left otitis media) 01/30/2021   Hyperglycemia 06/28/2020   Head injury due to trauma 03/02/2020   Pedal edema 08/26/2019   Impetigo 02/03/2019   Constipation 12/07/2018   Nasal turbinate hypertrophy 07/27/2016   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   Hyperlipidemia, mild 04/18/2015   Allergic state 02/17/2014   Alcohol abuse, 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

## 2022-12-26 NOTE — ED Notes (Signed)
Pt given his belongings

## 2022-12-26 NOTE — ED Notes (Signed)
TRIED CONTACTING TTS; NO ANSWER

## 2022-12-27 ENCOUNTER — Telehealth (HOSPITAL_COMMUNITY): Payer: Self-pay | Admitting: Licensed Clinical Social Worker

## 2022-12-27 ENCOUNTER — Ambulatory Visit (HOSPITAL_COMMUNITY)
Admission: EM | Admit: 2022-12-27 | Discharge: 2022-12-28 | Disposition: A | Discharge status: Other Acute Inpatient Hospital | Payer: Medicare Other | Attending: Psychiatry | Admitting: Psychiatry

## 2022-12-27 ENCOUNTER — Other Ambulatory Visit: Payer: Self-pay

## 2022-12-27 ENCOUNTER — Encounter (HOSPITAL_COMMUNITY): Payer: Self-pay

## 2022-12-27 ENCOUNTER — Ambulatory Visit (HOSPITAL_COMMUNITY): Payer: Medicare Other

## 2022-12-27 DIAGNOSIS — F419 Anxiety disorder, unspecified: Secondary | ICD-10-CM | POA: Insufficient documentation

## 2022-12-27 DIAGNOSIS — Z9151 Personal history of suicidal behavior: Secondary | ICD-10-CM | POA: Insufficient documentation

## 2022-12-27 DIAGNOSIS — R45851 Suicidal ideations: Secondary | ICD-10-CM | POA: Insufficient documentation

## 2022-12-27 DIAGNOSIS — F909 Attention-deficit hyperactivity disorder, unspecified type: Secondary | ICD-10-CM | POA: Diagnosis not present

## 2022-12-27 DIAGNOSIS — R4589 Other symptoms and signs involving emotional state: Secondary | ICD-10-CM

## 2022-12-27 DIAGNOSIS — F319 Bipolar disorder, unspecified: Secondary | ICD-10-CM | POA: Diagnosis not present

## 2022-12-27 DIAGNOSIS — Z1152 Encounter for screening for COVID-19: Secondary | ICD-10-CM | POA: Diagnosis not present

## 2022-12-27 DIAGNOSIS — Z765 Malingerer [conscious simulation]: Secondary | ICD-10-CM | POA: Diagnosis not present

## 2022-12-27 LAB — POCT URINE DRUG SCREEN - MANUAL ENTRY (I-SCREEN)
POC Amphetamine UR: NOT DETECTED
POC Buprenorphine (BUP): NOT DETECTED
POC Cocaine UR: NOT DETECTED
POC Marijuana UR: NOT DETECTED
POC Methadone UR: NOT DETECTED
POC Methamphetamine UR: NOT DETECTED
POC Morphine: NOT DETECTED
POC Oxazepam (BZO): NOT DETECTED
POC Oxycodone UR: NOT DETECTED
POC Secobarbital (BAR): NOT DETECTED

## 2022-12-27 MED ORDER — TRAZODONE HCL 50 MG PO TABS
50.0000 mg | ORAL_TABLET | Freq: Every day | ORAL | Status: DC
  Administered 2022-12-27: 50 mg via ORAL
  Filled 2022-12-27: qty 1

## 2022-12-27 MED ORDER — ZIPRASIDONE MESYLATE 20 MG IM SOLR: 20.0000 mg | INTRAMUSCULAR | Status: DC | PRN

## 2022-12-27 MED ORDER — OMEGA-3-ACID ETHYL ESTERS 1 G PO CAPS
2.0000 g | ORAL_CAPSULE | Freq: Every day | ORAL | Status: DC
  Administered 2022-12-28: 2 g via ORAL
  Filled 2022-12-27: qty 2

## 2022-12-27 MED ORDER — OLANZAPINE 5 MG PO TBDP
5.0000 mg | ORAL_TABLET | Freq: Three times a day (TID) | ORAL | Status: DC | PRN
  Administered 2022-12-27: 5 mg via ORAL
  Filled 2022-12-27: qty 1

## 2022-12-27 MED ORDER — FLUTICASONE PROPIONATE 50 MCG/ACT NA SUSP: 2.0000 | Freq: Every day | NASAL | Status: DC | PRN

## 2022-12-27 MED ORDER — ESCITALOPRAM OXALATE 10 MG PO TABS
20.0000 mg | ORAL_TABLET | Freq: Every day | ORAL | Status: DC
  Administered 2022-12-27 – 2022-12-28 (×2): 20 mg via ORAL
  Filled 2022-12-27 (×2): qty 2

## 2022-12-27 MED ORDER — ALUM & MAG HYDROXIDE-SIMETH 200-200-20 MG/5ML PO SUSP: 30.0000 mL | ORAL | Status: DC | PRN

## 2022-12-27 MED ORDER — MAGNESIUM HYDROXIDE 400 MG/5ML PO SUSP: 30.0000 mL | Freq: Every day | ORAL | Status: DC | PRN

## 2022-12-27 MED ORDER — ROPINIROLE HCL 0.25 MG PO TABS
0.2500 mg | ORAL_TABLET | Freq: Every day | ORAL | Status: DC
  Administered 2022-12-27: 0.25 mg via ORAL
  Filled 2022-12-27: qty 1

## 2022-12-27 MED ORDER — GABAPENTIN 300 MG PO CAPS
300.0000 mg | ORAL_CAPSULE | Freq: Two times a day (BID) | ORAL | Status: DC
  Administered 2022-12-27 – 2022-12-28 (×2): 300 mg via ORAL
  Filled 2022-12-27 (×2): qty 1

## 2022-12-27 MED ORDER — ATOMOXETINE HCL 25 MG PO CAPS
25.0000 mg | ORAL_CAPSULE | Freq: Two times a day (BID) | ORAL | Status: DC
  Administered 2022-12-28: 25 mg via ORAL
  Filled 2022-12-27: qty 1

## 2022-12-27 MED ORDER — ACETAMINOPHEN 325 MG PO TABS: 650.0000 mg | ORAL_TABLET | Freq: Four times a day (QID) | ORAL | Status: DC | PRN

## 2022-12-27 NOTE — ED Provider Notes (Signed)
[facility] Continuous Assessment Admission H&P  Date: 12/28/22 Patient Name: Jesse Jones MRN: [id number] Chief Complaint: complain of suicidal ideation   Diagnoses:  Final diagnoses:  Suicidal ideation  Malingering  Anxious appearance    HPI: Jesse Jones,  42 y/o male with a history of SI, MDD,  GAD, Bipolar dx ADHD,  presented to [facility] via [facility] c/o of suicidal ideation.  Per the patient I want to kill myself taking a bottle of Advil or anything I can get.  Patient does have an extensive history of ER visit with the most recent 12/25/22.  According to patient he is seeing a psychiatrist at [facility].  According to patient he lives alone, patient reported that he has been suicidal for a while.   Face-to-face observation of patient, patient is alert and oriented x 4, speech is clear maintain eye contact.  Mood is anxious affect congruent with mood.  Patient endorsed suicidal ideation with plans to take a bottle of Advil.  Patient denies HI, AVH or paranoia.  Patient denies alcohol use, smoking, or illicit drug use.  When writer asked patient if he had any questions patient's first question was at am I going to get some Klonopin tonight.  Last visit to the ED show that patient overdosed on benzodiazepine.  Patient does seem to be malingering.  Patient does not seem to be influenced by external or internal stimuli.  Does more look like personal secondary gain.  Will not order any blood work tonight the patient recently had blood work done 2 days ago.  Recommend overnight observation.  Total Time spent with patient: 20 minutes  Musculoskeletal  Strength & Muscle Tone: within normal limits Gait & Station: normal Patient leans: N/A  Psychiatric Specialty Exam  Presentation General Appearance:  Casual  Eye Contact: Fair  Speech: Clear and Coherent  Speech Volume: Decreased  Handedness: Right   Mood and Affect  Mood: Anxious  Affect: Congruent   Thought  Process  Thought Processes: Coherent  Descriptions of Associations:Circumstantial  Orientation:Full (Time, Place and Person)  Thought Content:Scattered  Diagnosis of Schizophrenia or Schizoaffective disorder in past: No   Hallucinations:Hallucinations: None  Ideas of Reference:None  Suicidal Thoughts:Suicidal Thoughts: Yes, Passive SI Passive Intent and/or Plan: With Plan; With Intent  Homicidal Thoughts:Homicidal Thoughts: No   Sensorium  Memory: Immediate Fair  Judgment: Poor  Insight: Fair   Materials engineer: Fair  Attention Span: Good  Recall: Good  Fund of Knowledge: Good  Language: Good   Psychomotor Activity  Psychomotor Activity: Psychomotor Activity: Normal   Assets  Assets: Desire for Improvement; Housing   Sleep  Sleep: Sleep: Fair   Nutritional Assessment (For OBS and FBC admissions only) Has the patient had a weight loss or gain of 10 pounds or more in the last 3 months?: Yes Has the patient had a decrease in food intake/or appetite?: No Does the patient have dental problems?: No Does the patient have eating habits or behaviors that may be indicators of an eating disorder including binging or inducing vomiting?: No Has the patient recently lost weight without trying?: 0 Has the patient been eating poorly because of a decreased appetite?: 0 Malnutrition Screening Tool Score: 0    Physical Exam HENT:     Head: Normocephalic.     Nose: Nose normal.  Cardiovascular:     Rate and Rhythm: Normal rate.  Pulmonary:     Effort: Pulmonary effort is normal.  Musculoskeletal:  General: Normal range of motion.     Cervical back: Normal range of motion.  Neurological:     General: No focal deficit present.     Mental Status: He is alert.  Psychiatric:        Mood and Affect: Mood normal.        Behavior: Behavior normal.        Thought Content: Thought content normal.        Judgment: Judgment normal.     Review of Systems  Constitutional: Negative.   HENT: Negative.    Eyes: Negative.   Respiratory: Negative.    Cardiovascular: Negative.   Gastrointestinal: Negative.   Genitourinary: Negative.   Musculoskeletal: Negative.   Skin: Negative.   Neurological: Negative.   Psychiatric/Behavioral:  Positive for suicidal ideas. The patient is nervous/anxious.     Blood pressure 116/82, pulse 96, temperature 98.4 F (36.9 C), temperature source Oral, resp. rate 18, SpO2 100 %. There is no height or weight on file to calculate BMI.  Past Psychiatric History: Major depressive disorder, anxiety, bipolar disorder, SI,  Is the patient at risk to self? Yes  Has the patient been a risk to self in the past 6 months? Yes .    Has the patient been a risk to self within the distant past? Yes   Is the patient a risk to others? No   Has the patient been a risk to others in the past 6 months? No   Has the patient been a risk to others within the distant past? No   Past Medical History: See chart  Family History: Unknown  Social History: Unknown  Last Labs:  Admission on 12/27/2022  Component Date Value Ref Range Status   SARS Coronavirus 2 by RT PCR 12/27/2022 NEGATIVE  NEGATIVE Final   Influenza A by PCR 12/27/2022 NEGATIVE  NEGATIVE Final   Influenza B by PCR 12/27/2022 NEGATIVE  NEGATIVE Final   Comment: (NOTE) The Xpert Xpress SARS-CoV-2/FLU/RSV plus assay is intended as an aid in the diagnosis of influenza from Nasopharyngeal swab specimens and should not be used as a sole basis for treatment. Nasal washings and aspirates are unacceptable for Xpert Xpress SARS-CoV-2/FLU/RSV testing.  Fact Sheet for Patients: EntrepreneurPulse.com.au  Fact Sheet for Healthcare Providers: IncredibleEmployment.be  This test is not yet approved or cleared by the [place] FDA and has been authorized for detection and/or diagnosis of SARS-CoV-2 by FDA under  an Emergency Use Authorization (EUA). This EUA will remain in effect (meaning this test can be used) for the duration of the COVID-19 declaration under Section 564(b)(1) of the Act, 21 U.S.C. section 360bbb-3(b)(1), unless the authorization is terminated or revoked.     Resp Syncytial Virus by PCR 12/27/2022 NEGATIVE  NEGATIVE Final   Comment: (NOTE) Fact Sheet for Patients: EntrepreneurPulse.com.au  Fact Sheet for Healthcare Providers: IncredibleEmployment.be  This test is not yet approved or cleared by the [place] FDA and has been authorized for detection and/or diagnosis of SARS-CoV-2 by FDA under an Emergency Use Authorization (EUA). This EUA will remain in effect (meaning this test can be used) for the duration of the COVID-19 declaration under Section 564(b)(1) of the Act, 21 U.S.C. section 360bbb-3(b)(1), unless the authorization is terminated or revoked.  Performed at [facility], [place] [address], [place], [place] [address]    POC Amphetamine UR 12/27/2022 None Detected  NONE DETECTED (Cut Off Level 1000 ng/mL) Preliminary   POC Secobarbital (BAR) 12/27/2022 None Detected  NONE  DETECTED (Cut Off Level 300 ng/mL) Preliminary   POC Buprenorphine (BUP) 12/27/2022 None Detected  NONE DETECTED (Cut Off Level 10 ng/mL) Preliminary   POC Oxazepam (BZO) 12/27/2022 None Detected  NONE DETECTED (Cut Off Level 300 ng/mL) Preliminary   POC Cocaine UR 12/27/2022 None Detected  NONE DETECTED (Cut Off Level 300 ng/mL) Preliminary   POC Methamphetamine UR 12/27/2022 None Detected  NONE DETECTED (Cut Off Level 1000 ng/mL) Preliminary   POC Morphine 12/27/2022 None Detected  NONE DETECTED (Cut Off Level 300 ng/mL) Preliminary   POC Methadone UR 12/27/2022 None Detected  NONE DETECTED (Cut Off Level 300 ng/mL) Preliminary   POC Oxycodone UR 12/27/2022 None Detected  NONE DETECTED (Cut Off Level 100 ng/mL) Preliminary   POC Marijuana UR 12/27/2022  None Detected  NONE DETECTED (Cut Off Level 50 ng/mL) Preliminary  Admission on 12/25/2022, Discharged on 12/26/2022  Component Date Value Ref Range Status   Sodium 12/25/2022 136  135 - 145 mmol/L Final   Potassium 12/25/2022 3.7  3.5 - 5.1 mmol/L Final   Chloride 12/25/2022 100  98 - 111 mmol/L Final   CO2 12/25/2022 28  22 - 32 mmol/L Final   Glucose, Bld 12/25/2022 110 (H)  70 - 99 mg/dL Final   Glucose reference range applies only to samples taken after fasting for at least 8 hours.   BUN 12/25/2022 6  6 - 20 mg/dL Final   Creatinine, Ser 12/25/2022 0.95  0.61 - 1.24 mg/dL Final   Calcium 12/25/2022 9.9  8.9 - 10.3 mg/dL Final   GFR, Estimated 12/25/2022 >60  >60 mL/min Final   Comment: (NOTE) Calculated using the CKD-EPI Creatinine Equation (2021)    Anion gap 12/25/2022 8  5 - 15 Final   Performed at [facility], [address], [place], [place] [address]   WBC 12/25/2022 7.6  4.0 - 10.5 K/uL Final   RBC 12/25/2022 4.98  4.22 - 5.81 MIL/uL Final   Hemoglobin 12/25/2022 14.5  13.0 - 17.0 g/dL Final   HCT 12/25/2022 41.3  39.0 - 52.0 % Final   MCV 12/25/2022 82.9  80.0 - 100.0 fL Final   MCH 12/25/2022 29.1  26.0 - 34.0 pg Final   MCHC 12/25/2022 35.1  30.0 - 36.0 g/dL Final   RDW 12/25/2022 13.0  11.5 - 15.5 % Final   Platelets 12/25/2022 262  150 - 400 K/uL Final   nRBC 12/25/2022 0.0  0.0 - 0.2 % Final   Performed at [facility], [address] [place], [place] [address]   Alcohol, Ethyl (B) 12/26/2022 14 (H)  <10 mg/dL Final   Comment: (NOTE) Lowest detectable limit for serum alcohol is 10 mg/dL.  For medical purposes only. Performed at [facility], [address], [place], [place] [address]    Color, Urine 12/26/2022 YELLOW  YELLOW Final   APPearance 12/26/2022 CLEAR  CLEAR Final   Specific Gravity, Urine 12/26/2022 1.009  1.005 - 1.030 Final   pH 12/26/2022 5.5  5.0 - 8.0 Final   Glucose, UA 12/26/2022  NEGATIVE  NEGATIVE mg/dL Final   Hgb urine dipstick 12/26/2022 NEGATIVE  NEGATIVE Final   Bilirubin Urine 12/26/2022 NEGATIVE  NEGATIVE Final   Ketones, ur 12/26/2022 NEGATIVE  NEGATIVE mg/dL Final   Protein, ur 12/26/2022 NEGATIVE  NEGATIVE mg/dL Final   Nitrite 12/26/2022 NEGATIVE  NEGATIVE Final   Leukocytes,Ua 12/26/2022 NEGATIVE  NEGATIVE Final   Performed at [facility], [address] [place], [place] [address]  Opiates 12/26/2022 NONE DETECTED  NONE DETECTED Final   Cocaine 12/26/2022 NONE DETECTED  NONE DETECTED Final   Benzodiazepines 12/26/2022 NONE DETECTED  NONE DETECTED Final   Amphetamines 12/26/2022 NONE DETECTED  NONE DETECTED Final   Tetrahydrocannabinol 12/26/2022 NONE DETECTED  NONE DETECTED Final   Barbiturates 12/26/2022 NONE DETECTED  NONE DETECTED Final   Comment: (NOTE) DRUG SCREEN FOR MEDICAL PURPOSES ONLY.  IF CONFIRMATION IS NEEDED FOR ANY PURPOSE, NOTIFY LAB WITHIN 5 DAYS.  LOWEST DETECTABLE LIMITS FOR URINE DRUG SCREEN Drug Class                     Cutoff (ng/mL) Amphetamine and metabolites    1000 Barbiturate and metabolites    200 Benzodiazepine                 200 Opiates and metabolites        300 Cocaine and metabolites        300 THC                            50 Performed at [facility], [address], [place], [place] [address]   Office Visit on 12/25/2022  Component Date Value Ref Range Status   WBC 12/25/2022 7.1  4.0 - 10.5 K/uL Final   RBC 12/25/2022 5.00  4.22 - 5.81 Mil/uL Final   Hemoglobin 12/25/2022 14.9  13.0 - 17.0 g/dL Final   HCT 12/25/2022 42.5  39.0 - 52.0 % Final   MCV 12/25/2022 85.0  78.0 - 100.0 fl Final   MCHC 12/25/2022 35.1  30.0 - 36.0 g/dL Final   RDW 12/25/2022 13.7  11.5 - 15.5 % Final   Platelets 12/25/2022 282.0  150.0 - 400.0 K/uL Final   Neutrophils Relative % 12/25/2022 67.1  43.0 - 77.0 % Final   Lymphocytes Relative 12/25/2022 20.6  12.0 - 46.0 % Final    Monocytes Relative 12/25/2022 8.6  3.0 - 12.0 % Final   Eosinophils Relative 12/25/2022 3.0  0.0 - 5.0 % Final   Basophils Relative 12/25/2022 0.7  0.0 - 3.0 % Final   Neutro Abs 12/25/2022 4.7  1.4 - 7.7 K/uL Final   Lymphs Abs 12/25/2022 1.5  0.7 - 4.0 K/uL Final   Monocytes Absolute 12/25/2022 0.6  0.1 - 1.0 K/uL Final   Eosinophils Absolute 12/25/2022 0.2  0.0 - 0.7 K/uL Final   Basophils Absolute 12/25/2022 0.0  0.0 - 0.1 K/uL Final   Sodium 12/25/2022 136  135 - 145 mEq/L Final   Potassium 12/25/2022 4.4  3.5 - 5.1 mEq/L Final   Chloride 12/25/2022 98  96 - 112 mEq/L Final   CO2 12/25/2022 30  19 - 32 mEq/L Final   Glucose, Bld 12/25/2022 83  70 - 99 mg/dL Final   BUN 12/25/2022 9  6 - 23 mg/dL Final   Creatinine, Ser 12/25/2022 0.93  0.40 - 1.50 mg/dL Final   Total Bilirubin 12/25/2022 0.5  0.2 - 1.2 mg/dL Final   Alkaline Phosphatase 12/25/2022 69  39 - 117 U/L Final   AST 12/25/2022 14  0 - 37 U/L Final   ALT 12/25/2022 9  0 - 53 U/L Final   Total Protein 12/25/2022 7.2  6.0 - 8.3 g/dL Final   Albumin 12/25/2022 4.6  3.5 - 5.2 g/dL Final   GFR 12/25/2022 102.83  >60.00 mL/min Final   Calculated using the CKD-EPI Creatinine Equation (2021)   Calcium  12/25/2022 10.3  8.4 - 10.5 mg/dL Final   Magnesium 12/25/2022 1.7  1.5 - 2.5 mg/dL Final  Admission on 12/08/2022, Discharged on 12/20/2022  Component Date Value Ref Range Status   Hgb A1c MFr Bld 12/09/2022 4.6 (L)  4.8 - 5.6 % Final   Comment: (NOTE) Pre diabetes:          5.7%-6.4%  Diabetes:              >6.4%  Glycemic control for   <7.0% adults with diabetes    Mean Plasma Glucose 12/09/2022 85.32  mg/dL Final   Performed at [facility], [place] [address], [place], [place] [address]   TSH 12/09/2022 1.766  0.350 - 4.500 uIU/mL Final   Comment: Performed by a 3rd Generation assay with a functional sensitivity of <=0.01 uIU/mL. Performed at [facility], [place] [address] [place], [place]  [address]    Sodium 12/09/2022 137  135 - 145 mmol/L Final   Potassium 12/09/2022 3.7  3.5 - 5.1 mmol/L Final   Chloride 12/09/2022 99  98 - 111 mmol/L Final   CO2 12/09/2022 26  22 - 32 mmol/L Final   Glucose, Bld 12/09/2022 110 (H)  70 - 99 mg/dL Final   Glucose reference range applies only to samples taken after fasting for at least 8 hours.   BUN 12/09/2022 <5 (L)  6 - 20 mg/dL Final   Creatinine, Ser 12/09/2022 0.82  0.61 - 1.24 mg/dL Final   Calcium 12/09/2022 9.8  8.9 - 10.3 mg/dL Final   Total Protein 12/09/2022 8.2 (H)  6.5 - 8.1 g/dL Final   Albumin 12/09/2022 4.9  3.5 - 5.0 g/dL Final   AST 12/09/2022 20  15 - 41 U/L Final   ALT 12/09/2022 13  0 - 44 U/L Final   Alkaline Phosphatase 12/09/2022 55  38 - 126 U/L Final   Total Bilirubin 12/09/2022 0.9  0.3 - 1.2 mg/dL Final   GFR, Estimated 12/09/2022 >60  >60 mL/min Final   Comment: (NOTE) Calculated using the CKD-EPI Creatinine Equation (2021)    Anion gap 12/09/2022 12  5 - 15 Final   Performed at [facility], [place] [address] [place], [place] [address]   WBC 12/09/2022 8.0  4.0 - 10.5 K/uL Final   RBC 12/09/2022 5.20  4.22 - 5.81 MIL/uL Final   Hemoglobin 12/09/2022 14.9  13.0 - 17.0 g/dL Final   HCT 12/09/2022 43.8  39.0 - 52.0 % Final   MCV 12/09/2022 84.2  80.0 - 100.0 fL Final   MCH 12/09/2022 28.7  26.0 - 34.0 pg Final   MCHC 12/09/2022 34.0  30.0 - 36.0 g/dL Final   RDW 12/09/2022 13.1  11.5 - 15.5 % Final   Platelets 12/09/2022 304  150 - 400 K/uL Final   nRBC 12/09/2022 0.0  0.0 - 0.2 % Final   Neutrophils Relative % 12/09/2022 52  % Final   Neutro Abs 12/09/2022 4.2  1.7 - 7.7 K/uL Final   Lymphocytes Relative 12/09/2022 33  % Final   Lymphs Abs 12/09/2022 2.7  0.7 - 4.0 K/uL Final   Monocytes Relative 12/09/2022 10  % Final   Monocytes Absolute 12/09/2022 0.8  0.1 - 1.0 K/uL Final   Eosinophils Relative 12/09/2022 4  % Final   Eosinophils Absolute 12/09/2022 0.4  0.0 - 0.5 K/uL Final    Basophils Relative 12/09/2022 1  % Final   Basophils Absolute 12/09/2022 0.1  0.0 - 0.1  K/uL Final   Immature Granulocytes 12/09/2022 0  % Final   Abs Immature Granulocytes 12/09/2022 0.02  0.00 - 0.07 K/uL Final   Performed at [facility], [place] [address], [place], [place] [address]   Cholesterol 12/09/2022 191  0 - 200 mg/dL Final   Triglycerides 12/09/2022 42  <150 mg/dL Final   HDL 12/09/2022 60  >40 mg/dL Final   Total CHOL/HDL Ratio 12/09/2022 3.2  RATIO Final   VLDL 12/09/2022 8  0 - 40 mg/dL Final   LDL Cholesterol 12/09/2022 123 (H)  0 - 99 mg/dL Final   Comment:        Total Cholesterol/HDL:CHD Risk Coronary Heart Disease Risk Table                     Men   Women  1/2 Average Risk   3.4   3.3  Average Risk       5.0   4.4  2 X Average Risk   9.6   7.1  3 X Average Risk  23.4   11.0        Use the calculated Patient Ratio above and the CHD Risk Table to determine the patient's CHD Risk.        ATP III CLASSIFICATION (LDL):  <100     mg/dL   Optimal  100-129  mg/dL   Near or Above                    Optimal  130-159  mg/dL   Borderline  160-189  mg/dL   High  >190     mg/dL   Very High Performed at [place] [address], [place],  [address]    Opiates 12/09/2022 NONE DETECTED  NONE DETECTED Final   Cocaine 12/09/2022 NONE DETECTED  NONE DETECTED Final   Benzodiazepines 12/09/2022 POSITIVE (A)  NONE DETECTED Final   Amphetamines 12/09/2022 POSITIVE (A)  NONE DETECTED Final   Tetrahydrocannabinol 12/09/2022 NONE DETECTED  NONE DETECTED Final   Barbiturates 12/09/2022 NONE DETECTED  NONE DETECTED Final   Comment: (NOTE) DRUG SCREEN FOR MEDICAL PURPOSES ONLY.  IF CONFIRMATION IS NEEDED FOR ANY PURPOSE, NOTIFY LAB WITHIN 5 DAYS.  LOWEST DETECTABLE LIMITS FOR URINE DRUG SCREEN Drug Class                     Cutoff (ng/mL) Amphetamine and metabolites    1000 Barbiturate and metabolites    200 Benzodiazepine                  200 Opiates and metabolites        300 Cocaine and metabolites        300 THC                            50 Performed at [facility], [place] [address], [place], [place] [address]    Color, Urine 12/11/2022 YELLOW  YELLOW Final   APPearance 12/11/2022 CLEAR  CLEAR Final   Specific Gravity, Urine 12/11/2022 1.010  1.005 - 1.030 Final   pH 12/11/2022 7.0  5.0 - 8.0 Final   Glucose, UA 12/11/2022 NEGATIVE  NEGATIVE mg/dL Final   Hgb urine dipstick 12/11/2022 NEGATIVE  NEGATIVE Final   Bilirubin Urine 12/11/2022 NEGATIVE  NEGATIVE Final   Ketones, ur 12/11/2022 NEGATIVE  NEGATIVE mg/dL Final   Protein, ur 12/11/2022 NEGATIVE  NEGATIVE  mg/dL Final   Nitrite 12/11/2022 NEGATIVE  NEGATIVE Final   Leukocytes,Ua 12/11/2022 NEGATIVE  NEGATIVE Final   RBC / HPF 12/11/2022 0-5  0 - 5 RBC/hpf Final   WBC, UA 12/11/2022 0-5  0 - 5 WBC/hpf Final   Bacteria, UA 12/11/2022 NONE SEEN  NONE SEEN Final   Squamous Epithelial / HPF 12/11/2022 0-5  0 - 5 /HPF Final   Performed at [facility], [place] [address], [place], [place] [address]  Admission on 12/06/2022, Discharged on 12/08/2022  Component Date Value Ref Range Status   WBC 12/06/2022 7.7  4.0 - 10.5 K/uL Final   RBC 12/06/2022 5.55  4.22 - 5.81 MIL/uL Final   Hemoglobin 12/06/2022 16.1  13.0 - 17.0 g/dL Final   HCT 12/06/2022 47.0  39.0 - 52.0 % Final   MCV 12/06/2022 84.7  80.0 - 100.0 fL Final   MCH 12/06/2022 29.0  26.0 - 34.0 pg Final   MCHC 12/06/2022 34.3  30.0 - 36.0 g/dL Final   RDW 12/06/2022 12.8  11.5 - 15.5 % Final   Platelets 12/06/2022 280  150 - 400 K/uL Final   nRBC 12/06/2022 0.0  0.0 - 0.2 % Final   Neutrophils Relative % 12/06/2022 69  % Final   Neutro Abs 12/06/2022 5.3  1.7 - 7.7 K/uL Final   Lymphocytes Relative 12/06/2022 21  % Final   Lymphs Abs 12/06/2022 1.6  0.7 - 4.0 K/uL Final   Monocytes Relative 12/06/2022 6  % Final   Monocytes Absolute 12/06/2022 0.5  0.1 - 1.0 K/uL  Final   Eosinophils Relative 12/06/2022 3  % Final   Eosinophils Absolute 12/06/2022 0.2  0.0 - 0.5 K/uL Final   Basophils Relative 12/06/2022 1  % Final   Basophils Absolute 12/06/2022 0.1  0.0 - 0.1 K/uL Final   Immature Granulocytes 12/06/2022 0  % Final   Abs Immature Granulocytes 12/06/2022 0.01  0.00 - 0.07 K/uL Final   Performed at [facility], [place] [address], [place], [place] [address]   Sodium 12/06/2022 134 (L)  135 - 145 mmol/L Final   Potassium 12/06/2022 4.0  3.5 - 5.1 mmol/L Final   Chloride 12/06/2022 95 (L)  98 - 111 mmol/L Final   CO2 12/06/2022 25  22 - 32 mmol/L Final   Glucose, Bld 12/06/2022 87  70 - 99 mg/dL Final   Glucose reference range applies only to samples taken after fasting for at least 8 hours.   BUN 12/06/2022 8  6 - 20 mg/dL Final   Creatinine, Ser 12/06/2022 0.90  0.61 - 1.24 mg/dL Final   Calcium 12/06/2022 9.6  8.9 - 10.3 mg/dL Final   GFR, Estimated 12/06/2022 >60  >60 mL/min Final   Comment: (NOTE) Calculated using the CKD-EPI Creatinine Equation (2021)    Anion gap 12/06/2022 14  5 - 15 Final   Performed at [facility], [place] [address], [place], [place] [address]   Lipase 12/06/2022 43  11 - 51 U/L Final   Performed at [facility], [place] [address], [place], [place] [address]   Total Protein 12/06/2022 8.3 (H)  6.5 - 8.1 g/dL Final   Albumin 12/06/2022 4.9  3.5 - 5.0 g/dL Final   AST 12/06/2022 17  15 - 41 U/L Final   ALT 12/06/2022 12  0 - 44 U/L Final   Alkaline Phosphatase 12/06/2022 55  38 - 126 U/L Final   Total Bilirubin 12/06/2022 1.1  0.3 -  1.2 mg/dL Final   Bilirubin, Direct 12/06/2022 0.1  0.0 - 0.2 mg/dL Final   Indirect Bilirubin 12/06/2022 1.0 (H)  0.3 - 0.9 mg/dL Final   Performed at [facility], [place] [address] [place], [place] [address]   SARS Coronavirus 2 by RT PCR 12/06/2022 NEGATIVE  NEGATIVE Final   Comment: (NOTE) SARS-CoV-2 target nucleic  acids are NOT DETECTED.  The SARS-CoV-2 RNA is generally detectable in upper and lower respiratory specimens during the acute phase of infection. The lowest concentration of SARS-CoV-2 viral copies this assay can detect is 250 copies / mL. A negative result does not preclude SARS-CoV-2 infection and should not be used as the sole basis for treatment or other patient management decisions.  A negative result may occur with improper specimen collection / handling, submission of specimen other than nasopharyngeal swab, presence of viral mutation(s) within the areas targeted by this assay, and inadequate number of viral copies (<250 copies / mL). A negative result must be combined with clinical observations, patient history, and epidemiological information.  Fact Sheet for Patients:   https://www.patel.info/  Fact Sheet for Healthcare Providers: https://hall.com/  This test is not yet approved or                           cleared by the [place] FDA and has been authorized for detection and/or diagnosis of SARS-CoV-2 by FDA under an Emergency Use Authorization (EUA).  This EUA will remain in effect (meaning this test can be used) for the duration of the COVID-19 declaration under Section 564(b)(1) of the Act, 21 U.S.C. section 360bbb-3(b)(1), unless the authorization is terminated or revoked sooner.  Performed at [facility], [place] [address], [place], [place] [address]    SARS Coronavirus 2 by RT PCR 12/07/2022 NEGATIVE  NEGATIVE Final   Comment: (NOTE) SARS-CoV-2 target nucleic acids are NOT DETECTED.  The SARS-CoV-2 RNA is generally detectable in upper respiratory specimens during the acute phase of infection. The lowest concentration of SARS-CoV-2 viral copies this assay can detect is 138 copies/mL. A negative result does not preclude SARS-Cov-2 infection and should not be used as the sole basis for treatment  or other patient management decisions. A negative result may occur with  improper specimen collection/handling, submission of specimen other than nasopharyngeal swab, presence of viral mutation(s) within the areas targeted by this assay, and inadequate number of viral copies(<138 copies/mL). A negative result must be combined with clinical observations, patient history, and epidemiological information. The expected result is Negative.  Fact Sheet for Patients:  EntrepreneurPulse.com.au  Fact Sheet for Healthcare Providers:  IncredibleEmployment.be  This test is no                          t yet approved or cleared by the [place] FDA and  has been authorized for detection and/or diagnosis of SARS-CoV-2 by FDA under an Emergency Use Authorization (EUA). This EUA will remain  in effect (meaning this test can be used) for the duration of the COVID-19 declaration under Section 564(b)(1) of the Act, 21 U.S.C.section 360bbb-3(b)(1), unless the authorization is terminated  or revoked sooner.       Influenza A by PCR 12/07/2022 NEGATIVE  NEGATIVE Final   Influenza B by PCR 12/07/2022 NEGATIVE  NEGATIVE Final   Comment: (NOTE) The Xpert Xpress SARS-CoV-2/FLU/RSV plus assay is intended as an aid in the diagnosis of influenza from  Nasopharyngeal swab specimens and should not be used as a sole basis for treatment. Nasal washings and aspirates are unacceptable for Xpert Xpress SARS-CoV-2/FLU/RSV testing.  Fact Sheet for Patients: EntrepreneurPulse.com.au  Fact Sheet for Healthcare Providers: IncredibleEmployment.be  This test is not yet approved or cleared by the [place] FDA and has been authorized for detection and/or diagnosis of SARS-CoV-2 by FDA under an Emergency Use Authorization (EUA). This EUA will remain in effect (meaning this test can be used) for the duration of the COVID-19 declaration under  Section 564(b)(1) of the Act, 21 U.S.C. section 360bbb-3(b)(1), unless the authorization is terminated or revoked.     Resp Syncytial Virus by PCR 12/07/2022 NEGATIVE  NEGATIVE Final   Comment: (NOTE) Fact Sheet for Patients: EntrepreneurPulse.com.au  Fact Sheet for Healthcare Providers: IncredibleEmployment.be  This test is not yet approved or cleared by the [place] FDA and has been authorized for detection and/or diagnosis of SARS-CoV-2 by FDA under an Emergency Use Authorization (EUA). This EUA will remain in effect (meaning this test can be used) for the duration of the COVID-19 declaration under Section 564(b)(1) of the Act, 21 U.S.C. section 360bbb-3(b)(1), unless the authorization is terminated or revoked.  Performed at [facility], [place] [address], [place], [place] [address]     Allergies: Haloperidol decanoate, Hydroxyzine, Other, Penicillins, and Valium  Medications:  Facility Ordered Medications  Medication   acetaminophen (TYLENOL) tablet 650 mg   alum & mag hydroxide-simeth (MAALOX/MYLANTA) 200-200-20 MG/5ML suspension 30 mL   magnesium hydroxide (MILK OF MAGNESIA) suspension 30 mL   OLANZapine zydis (ZYPREXA) disintegrating tablet 5 mg   And   ziprasidone (GEODON) injection 20 mg   atomoxetine (STRATTERA) capsule 25 mg   escitalopram (LEXAPRO) tablet 20 mg   omega-3 acid ethyl esters (LOVAZA) capsule 2 g   fluticasone (FLONASE) 50 MCG/ACT nasal spray 2 spray   gabapentin (NEURONTIN) capsule 300 mg   rOPINIRole (REQUIP) tablet 0.25 mg   traZODone (DESYREL) tablet 50 mg   PTA Medications  Medication Sig   fish oil-omega-3 fatty acids 1000 MG capsule Take 2 g by mouth daily.   fluticasone (FLONASE) 50 MCG/ACT nasal spray SPRAY 2 SPRAYS INTO BOTH NOSTRILS DAILY AS NEEDED FOR ALLERGIES OR RHINITIS. (Patient taking differently: Place 2 sprays into both nostrils daily as needed for allergies.)    atomoxetine (STRATTERA) 25 MG capsule Take 1 capsule (25 mg total) by mouth 2 (two) times daily with a meal.   escitalopram (LEXAPRO) 20 MG tablet Take 1 tablet daily   traZODone (DESYREL) 50 MG tablet Take 1 tablet (50 mg total) by mouth at bedtime.   clonazePAM (KLONOPIN) 0.5 MG tablet Take 1 tablet (0.5 mg total) by mouth 3 (three) times daily.   gabapentin (NEURONTIN) 300 MG capsule Take 1 capsule (300 mg total) by mouth 2 (two) times daily.   rOPINIRole (REQUIP) 0.25 MG tablet Take 1 tablet (0.25 mg total) by mouth at bedtime.    Medical Decision Making  Inpatient observation   Meds ordered this encounter  Medications   acetaminophen (TYLENOL) tablet 650 mg   alum & mag hydroxide-simeth (MAALOX/MYLANTA) 200-200-20 MG/5ML suspension 30 mL   magnesium hydroxide (MILK OF MAGNESIA) suspension 30 mL   AND Linked Order Group    OLANZapine zydis (ZYPREXA) disintegrating tablet 5 mg    ziprasidone (GEODON) injection 20 mg   atomoxetine (STRATTERA) capsule 25 mg   escitalopram (LEXAPRO) tablet 20 mg   omega-3 acid ethyl esters (LOVAZA) capsule 2 g  fluticasone (FLONASE) 50 MCG/ACT nasal spray 2 spray   gabapentin (NEURONTIN) capsule 300 mg   rOPINIRole (REQUIP) tablet 0.25 mg   traZODone (DESYREL) tablet 50 mg    Lab Orders         Resp panel by RT-PCR (RSV, Flu A&B, Covid) Anterior Nasal Swab         POCT Urine Drug Screen - (I-Screen)        Recommendations  Based on my evaluation the patient appears to have an emergency medical condition for which I recommend the patient be transferred to the emergency department for further evaluation.  [name], NP 12/28/22  5:49 AM

## 2022-12-27 NOTE — Telephone Encounter (Signed)
Cln called pt's father in an attempt to reach pt, as cln was informed pt's father is supportive by referring NP and was provided with father's phone number. Cln introduced self but did not specify cln is calling from [facility] outpatient PHP due to HIPAA, but stated "Ovila Lepage from [facility]" and requested to speak to pt. Pt's father stated pt is at home and is not feeling well and may be sleeping. Cln thanked pt's father and ended the call.

## 2022-12-27 NOTE — BH Assessment (Addendum)
Comprehensive Clinical Assessment (CCA) Note  12/27/2022 Jesse Jones [id number] Disposition: Pt called [employer] to get a ride to [facility].  He was triaged by Mayford Knife, NT.  This clinician completed the CCA.  Pt was seen by [name], NP.  Roy did the MSE.  He also noted that patient came in to [facility] on 02/27 and was discharged on 02/28 and that patient had been asking about geeting medications.  Pt said he was out of his clonopin and asked if he may have some tonight.  Roy admitted him to [facility] for continuous assessment.  He also told patient that he was not getting any benzodiazepines tonight.  Pt agreed to stay at [place].  Pt is anxious, shaking his legs and occasionally getting up to pace.  Patient maintains good eye contact.  He is cooperative and does not appear to be responding to internal stimuli.  Pt does nto appear to be delusional in thoughts.  He reports poor sleep and appetite however.    Pt has outpatient psychiatry through [facility].     Chief Complaint:  Chief Complaint  Patient presents with   suicidal ideation   Visit Diagnosis: Generalized Anxiety D/O : Bipolar d/o   CCA Screening, Triage and Referral (STR)  Patient Reported Information How did you hear about [place]? Legal System  What Is the Reason for Your Visit/Call Today? Pt presents to [facility] voluntarily, accompanied by [employer] with complaint of suicidal ideation with no plan. Pt was picked up from his apartment tonight after he called the police. Pt reports having SI for about two weeks and reports family issues as a current stressor. Pt was seen at [facility] on 12/06/22 for intentional overdose on Xanax. Pt stated diagnosis of Bipolar and anxiety. Pt currently denies HI, AVH, substance/alcohol use.  Pt tells this clinician that he had a plan to take a "bottle of advil" to kill himself.  Pt attempted suicide at age 9.  Pt denies hx of self harm.  He describes himself as depressed and hopeless.  Pt says that he takes  clonopin for his anxiety.  He asked about getting some clonazepam tonight.  He says he has not had his medication today due to his not being able to get to the pharmacy due to not having a car.  Pt denies any access to guns or other weapons.  Pt appetite has been "pretty poor."  How Long Has This Been Causing You Problems? 1 wk - 1 month  What Do You Feel Would Help You the Most Today? Treatment for Depression or other mood problem   Have You Recently Had Any Thoughts About Hurting Yourself? Yes  Are You Planning to Commit Suicide/Harm Yourself At This time? No   Flowsheet Row ED from 12/27/2022 in [facility] ED from 12/25/2022 in [facility] Emergency Department at [facility] Admission (Discharged) from 12/08/2022 in [place] [facility]  C-SSRS RISK CATEGORY High Risk Moderate Risk Low Risk       Have you Recently Had Thoughts About [place]? No  Are You Planning to Harm Someone at This Time? No  Explanation: Pt has thoughts of "taking a bottle of Advil" No HI   Have You Used Any Alcohol or Drugs in the Past 24 Hours? No  What Did You Use and How Much? None   Do You Currently Have a Therapist/Psychiatrist? Yes  Name of Therapist/Psychiatrist: Name of Therapist/Psychiatrist: [facility].   Have  You Been Recently Discharged From Any Office Practice or Programs? Yes  Explanation of Discharge From Practice/Program: Was at [facility] from     [place] Screening Triage Referral Assessment Type of Contact: Face-to-Face  Telemedicine Service Delivery: Telemedicine service delivery: This service was provided via telemedicine using a 2-way, interactive audio and video technology  Is this Initial or Reassessment? Is this Initial or Reassessment?: Initial Assessment  Date Telepsych consult ordered in [facility]:  Date Telepsych consult ordered in [facility]: 12/26/22  Time Telepsych consult ordered in [facility]:  Time Telepsych  consult ordered in [facility]: 0139  Location of Assessment: [facility] Assessment Services  Provider Location: [facility] Assessment Services   Collateral Involvement: NOne   Does Patient Have a [place]? No  Legal Guardian Contact Information: Pt does not have a legal guardian  Copy of Legal Guardianship Form: -- (Pt does not have a legal guardian)  Legal Guardian Notified of Arrival: -- (Pt does not have a legal guardian)  Legal Guardian Notified of Pending Discharge: -- (Pt does not have a legal guardian)  If Minor and Not Living with Parent(s), Who has Custody? Pt is an adult  Is CPS involved or ever been involved? Never  Is APS involved or ever been involved? Never   Patient Determined To Be At Risk for Harm To Self or Others Based on Review of Patient Reported Information or Presenting Complaint? Yes, for Self-Harm  Method: -- (Pt has plan to overdose on Advil)  Availability of Means: -- (Pt could get medications to end his life OTC.)  Intent: Vague intent or NA  Notification Required: No need or identified person  Additional Information for Danger to Others Potential: Previous attempts  Additional Comments for Danger to Others Potential: N/A  Are There Guns or Other Weapons in Your Home? No  Types of Guns/Weapons: NOne  Are These Weapons Safely Secured?                            No (No weapons to be secured.)  Who Could Verify You Are Able To Have These Secured: No one  Do You Have any Outstanding Charges, Pending Court Dates, Parole/Probation? NOne  Contacted To Inform of Risk of Harm To Self or Others: Other: Comment (N/A)    Does Patient Present under Involuntary Commitment? No    [place] of Residence: Jesse Jones   Patient Currently Receiving the Following Services: Medication Management   Determination of Need: Urgent (48 hours)   Options For Referral: Other: Comment; Outpatient Therapy; Medication Management; [facility] (Pt to  have continuous assessment overnight.  D/C in the morning per [name], NP.)     CCA Biopsychosocial Patient Reported Schizophrenia/Schizoaffective Diagnosis in Past: No   Strengths: Patient is friendly   Mental Health Symptoms Depression:   Hopelessness; Worthlessness; Change in energy/activity   Duration of Depressive symptoms:  Duration of Depressive Symptoms: Greater than two weeks   Mania:   None   Anxiety:    Restlessness; Worrying; Sleep; Tension   Psychosis:   None   Duration of Psychotic symptoms:    Trauma:   None   Obsessions:   None   Compulsions:   None   Inattention:   None   Hyperactivity/Impulsivity:   None   Oppositional/Defiant Behaviors:   None   Emotional Irregularity:   None   Other Mood/Personality Symptoms:   None    Mental Status Exam Appearance and self-care  Stature:   Average   Weight:   Average weight   Clothing:   Dirty   Grooming:   Normal   Cosmetic use:   None   Posture/gait:   Normal   Motor activity:   Not Remarkable   Sensorium  Attention:   Normal   Concentration:   Normal   Orientation:   X5   Recall/memory:   Normal   Affect and Mood  Affect:   Flat   Mood:   Depressed   Relating  Eye contact:   Normal   Facial expression:   Depressed   Attitude toward examiner:   Cooperative   Thought and Language  Speech flow:  Normal   Thought content:   Appropriate to Mood and Circumstances   Preoccupation:   None   Hallucinations:   Visual   Organization:   Goal-directed; [place] of Knowledge:   Average   Intelligence:   Average   Abstraction:   Normal   Judgement:   Impaired   Reality Testing:   Adequate   Insight:   Fair   Decision Making:   Normal   Social Functioning  Social Maturity:   Responsible   Social Judgement:   Normal   Stress  Stressors:   Family conflict   Coping Ability:   Scientist, research (physical sciences) Deficits:   None   Supports:   Family     Religion: Religion/Spirituality Are You A Religious Person?: Yes What is Your Religious Affiliation?: Catholic How Might This Affect Treatment?: N/A  Leisure/Recreation: Leisure / Recreation Do You Have Hobbies?: Yes Leisure and Hobbies: Reading, art, and Xbox  Exercise/Diet: Exercise/Diet Do You Exercise?: No Have You Gained or Lost A Significant Amount of Weight in the Past Six Months?: No Do You Follow a Special Diet?: No Do You Have Any Trouble Sleeping?: Yes Explanation of Sleeping Difficulties: Patient reports no sleep in recent weeks   CCA Employment/Education Employment/Work Situation: Employment / Work Situation Employment Situation: On disability 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 How Long has Patient Been on Disability: 6-7 years Patient's Job has Been Impacted by Current Illness: No Has Patient ever Been in the [employer]?: No  Education: Education Is Patient Currently Attending School?: No Last Grade Completed: 12 Did You Attend College?: No Did You Have An Individualized Education Program (IIEP): No Did You Have Any Difficulty At School?: No Patient's Education Has Been Impacted by Current Illness: No   CCA Family/Childhood History Family and Relationship History: Family history Marital status: Single Does patient have children?: No  Childhood History:  Childhood History By whom was/is the patient raised?: Both parents Description of patient's current relationship with siblings: N/A Did patient suffer any verbal/emotional/physical/sexual abuse as a child?: No Did patient suffer from severe childhood neglect?: No Has patient ever been sexually abused/assaulted/raped as an adolescent or adult?: No Was the patient ever a victim of a crime or a disaster?: No Witnessed domestic violence?: No Has patient been affected by domestic violence as an adult?: No       CCA  Substance Use Alcohol/Drug Use: Alcohol / Drug Use Pain Medications: See MAR Prescriptions: See MAR Over the Counter: See MAR History of alcohol / drug use?: No history of alcohol / drug abuse Longest period of sobriety (when/how long): N/A Withdrawal Symptoms: None  ASAM's:  Six Dimensions of Multidimensional Assessment  Dimension 1:  Acute Intoxication and/or Withdrawal Potential:      Dimension 2:  Biomedical Conditions and Complications:      Dimension 3:  Emotional, Behavioral, or Cognitive Conditions and Complications:     Dimension 4:  Readiness to Change:     Dimension 5:  Relapse, Continued use, or Continued Problem Potential:     Dimension 6:  Recovery/Living Environment:     ASAM Severity Score:    ASAM Recommended Level of Treatment:     Substance use Disorder (SUD)    Recommendations for Services/Supports/Treatments:    Discharge Disposition:    DSM5 Diagnoses: Patient Active Problem List   Diagnosis Date Noted   Severe bipolar I disorder, current or most recent episode depressed ([place]) 12/10/2022   GAD (generalized anxiety disorder) 12/10/2022   Benzodiazepine dependence ([place]) 12/10/2022   Insomnia 12/10/2022   Intentional overdose ([place]) 12/07/2022   Ingrown toenail of right foot 07/19/2021   LOM (left otitis media) 01/30/2021   Hyperglycemia 06/28/2020   Head injury due to trauma 03/02/2020   Pedal edema 08/26/2019   Impetigo 02/03/2019   Constipation 12/07/2018   Nasal turbinate hypertrophy 07/27/2016   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   Hyperlipidemia, mild 04/18/2015   Allergic state 02/17/2014   Alcohol abuse, 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   Tachycardia 07/07/2012   ED (erectile dysfunction) 05/12/2012   Preventative health care 10/03/2011   Nicotine dependence 04/28/2011   ADHD (attention deficit hyperactivity disorder) 04/02/2011   Anxiety 04/02/2011   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

## 2022-12-27 NOTE — ED Triage Notes (Signed)
Pt presents to [facility] voluntarily, accompanied by [employer] with complaint of suicidal ideation with no plan. Pt was picked up from his apartment tonight after he called the police. Pt reports having SI for about two weeks and reports family issues as a current stressor. Pt was seen at [facility] on 12/06/22 for intentional overdose. Pt stated diagnosis of Bipolar and anxiety. Pt currently denies HI, AVH, substance/alcohol use.

## 2022-12-27 NOTE — ED Notes (Signed)
Pt sleeping at present, no distress noted.  Monitoring for safety. 

## 2022-12-27 NOTE — ED Notes (Signed)
Pt A&O x 4, presents with suicidal ideation, no plan noted.  Complaint of SI x 2 weeks with family issues as a stressor.  Pt calm at present, requesting Klonopin.  NP [name] at bedside to speak with pt.  No distress noted.  Monitoring for safety.

## 2022-12-28 ENCOUNTER — Encounter (HOSPITAL_COMMUNITY): Payer: Self-pay | Admitting: Psychiatry

## 2022-12-28 ENCOUNTER — Inpatient Hospital Stay (HOSPITAL_COMMUNITY)
Admission: AD | Admit: 2022-12-28 | Discharge: 2023-01-04 | DRG: 881 | Disposition: A | Discharge status: Home / Self Care | Payer: Medicare Other | Source: Intra-hospital | Attending: Psychiatry | Admitting: Psychiatry

## 2022-12-28 DIAGNOSIS — F329 Major depressive disorder, single episode, unspecified: Principal | ICD-10-CM | POA: Diagnosis present

## 2022-12-28 DIAGNOSIS — Z79899 Other long term (current) drug therapy: Secondary | ICD-10-CM | POA: Diagnosis not present

## 2022-12-28 DIAGNOSIS — R45851 Suicidal ideations: Secondary | ICD-10-CM | POA: Diagnosis present

## 2022-12-28 DIAGNOSIS — Z9151 Personal history of suicidal behavior: Secondary | ICD-10-CM | POA: Diagnosis not present

## 2022-12-28 DIAGNOSIS — F1011 Alcohol abuse, in remission: Secondary | ICD-10-CM | POA: Diagnosis present

## 2022-12-28 DIAGNOSIS — F332 Major depressive disorder, recurrent severe without psychotic features: Secondary | ICD-10-CM | POA: Diagnosis not present

## 2022-12-28 DIAGNOSIS — F909 Attention-deficit hyperactivity disorder, unspecified type: Secondary | ICD-10-CM | POA: Diagnosis present

## 2022-12-28 DIAGNOSIS — Z87820 Personal history of traumatic brain injury: Secondary | ICD-10-CM

## 2022-12-28 DIAGNOSIS — Z20822 Contact with and (suspected) exposure to covid-19: Secondary | ICD-10-CM | POA: Diagnosis present

## 2022-12-28 DIAGNOSIS — Z765 Malingerer [conscious simulation]: Secondary | ICD-10-CM | POA: Diagnosis not present

## 2022-12-28 DIAGNOSIS — F840 Autistic disorder: Secondary | ICD-10-CM | POA: Diagnosis present

## 2022-12-28 DIAGNOSIS — Z818 Family history of other mental and behavioral disorders: Secondary | ICD-10-CM | POA: Diagnosis not present

## 2022-12-28 DIAGNOSIS — F411 Generalized anxiety disorder: Secondary | ICD-10-CM | POA: Diagnosis present

## 2022-12-28 DIAGNOSIS — R4589 Other symptoms and signs involving emotional state: Secondary | ICD-10-CM | POA: Diagnosis not present

## 2022-12-28 DIAGNOSIS — Z23 Encounter for immunization: Secondary | ICD-10-CM | POA: Diagnosis not present

## 2022-12-28 DIAGNOSIS — G2581 Restless legs syndrome: Secondary | ICD-10-CM | POA: Diagnosis present

## 2022-12-28 DIAGNOSIS — E785 Hyperlipidemia, unspecified: Secondary | ICD-10-CM | POA: Diagnosis present

## 2022-12-28 DIAGNOSIS — F319 Bipolar disorder, unspecified: Secondary | ICD-10-CM | POA: Diagnosis not present

## 2022-12-28 DIAGNOSIS — F419 Anxiety disorder, unspecified: Secondary | ICD-10-CM | POA: Diagnosis not present

## 2022-12-28 DIAGNOSIS — Z1152 Encounter for screening for COVID-19: Secondary | ICD-10-CM | POA: Diagnosis not present

## 2022-12-28 DIAGNOSIS — Z87891 Personal history of nicotine dependence: Secondary | ICD-10-CM | POA: Diagnosis not present

## 2022-12-28 LAB — RESP PANEL BY RT-PCR (RSV, FLU A&B, COVID)  RVPGX2
Influenza A by PCR: NEGATIVE
Influenza B by PCR: NEGATIVE
Resp Syncytial Virus by PCR: NEGATIVE
SARS Coronavirus 2 by RT PCR: NEGATIVE

## 2022-12-28 MED ORDER — ROPINIROLE HCL 0.25 MG PO TABS
0.2500 mg | ORAL_TABLET | Freq: Every day | ORAL | Status: DC
  Administered 2022-12-28 – 2023-01-03 (×6): 0.25 mg via ORAL
  Filled 2022-12-28 (×11): qty 1

## 2022-12-28 MED ORDER — ADULT MULTIVITAMIN W/MINERALS CH
1.0000 | ORAL_TABLET | Freq: Every day | ORAL | Status: DC
  Administered 2022-12-28 – 2023-01-04 (×8): 1 via ORAL
  Filled 2022-12-28 (×11): qty 1

## 2022-12-28 MED ORDER — ZIPRASIDONE MESYLATE 20 MG IM SOLR: 20.0000 mg | INTRAMUSCULAR | Status: DC | PRN

## 2022-12-28 MED ORDER — ATOMOXETINE HCL 25 MG PO CAPS
25.0000 mg | ORAL_CAPSULE | Freq: Two times a day (BID) | ORAL | Status: DC
  Administered 2022-12-28 – 2023-01-04 (×14): 25 mg via ORAL
  Filled 2022-12-28 (×21): qty 1

## 2022-12-28 MED ORDER — INFLUENZA VAC SPLIT QUAD 0.5 ML IM SUSY
0.5000 mL | PREFILLED_SYRINGE | INTRAMUSCULAR | Status: AC
  Administered 2022-12-29: 0.5 mL via INTRAMUSCULAR
  Filled 2022-12-28: qty 0.5

## 2022-12-28 MED ORDER — PNEUMOCOCCAL 20-VAL CONJ VACC 0.5 ML IM SUSY
0.5000 mL | PREFILLED_SYRINGE | INTRAMUSCULAR | Status: AC
  Administered 2022-12-29: 0.5 mL via INTRAMUSCULAR
  Filled 2022-12-28: qty 0.5

## 2022-12-28 MED ORDER — MAGNESIUM HYDROXIDE 400 MG/5ML PO SUSP: 30.0000 mL | Freq: Every day | ORAL | Status: DC | PRN

## 2022-12-28 MED ORDER — ALUM & MAG HYDROXIDE-SIMETH 200-200-20 MG/5ML PO SUSP: 30.0000 mL | ORAL | Status: DC | PRN

## 2022-12-28 MED ORDER — ACETAMINOPHEN 325 MG PO TABS: 650.0000 mg | ORAL_TABLET | Freq: Four times a day (QID) | ORAL | Status: DC | PRN

## 2022-12-28 MED ORDER — GABAPENTIN 300 MG PO CAPS
300.0000 mg | ORAL_CAPSULE | Freq: Two times a day (BID) | ORAL | Status: DC
  Administered 2022-12-28 – 2022-12-29 (×2): 300 mg via ORAL
  Filled 2022-12-28 (×4): qty 1

## 2022-12-28 MED ORDER — TRAZODONE HCL 50 MG PO TABS
50.0000 mg | ORAL_TABLET | Freq: Every evening | ORAL | Status: DC | PRN
  Administered 2022-12-28 – 2023-01-03 (×6): 50 mg via ORAL
  Filled 2022-12-28 (×7): qty 1

## 2022-12-28 MED ORDER — BUSPIRONE HCL 15 MG PO TABS
15.0000 mg | ORAL_TABLET | Freq: Two times a day (BID) | ORAL | Status: DC
  Administered 2022-12-28: 15 mg via ORAL
  Filled 2022-12-28: qty 1

## 2022-12-28 MED ORDER — ESCITALOPRAM OXALATE 20 MG PO TABS
20.0000 mg | ORAL_TABLET | Freq: Every day | ORAL | Status: DC
  Administered 2022-12-29 – 2023-01-04 (×7): 20 mg via ORAL
  Filled 2022-12-28 (×8): qty 1

## 2022-12-28 MED ORDER — CLONAZEPAM 0.5 MG PO TABS
0.5000 mg | ORAL_TABLET | Freq: Three times a day (TID) | ORAL | Status: DC
  Administered 2022-12-28 – 2022-12-31 (×8): 0.5 mg via ORAL
  Filled 2022-12-28 (×10): qty 1

## 2022-12-28 MED ORDER — OLANZAPINE 5 MG PO TBDP: 5.0000 mg | ORAL_TABLET | Freq: Three times a day (TID) | ORAL | Status: DC | PRN

## 2022-12-28 NOTE — ED Notes (Signed)
Report called to [employer].  Verbalized understanding.

## 2022-12-28 NOTE — Progress Notes (Signed)
Patient admitted to 406-1 voluntarily from [facility]. He was here at [facility] recently. Per patient and report patient is endorsing SI with a plan to overdose on pills. Patient denies HI, AVH and contracts for safety while in the hospital. Patient reports anxiety, depression, and irritability. He reports he is stressed out. Contacts and eye drops placed in 400 hall med room. Patient reports he can be verbally aggressive but it takes him a lot to get there. He reports his family is his support system and he lives alone. He wants to work on "feeling better and not having suicidal thoughts". He reports pain in his legs rated 3/10 from "tremors" that happen when trying to sleep at night. Skin assessment found acne on back and was otherwise unremarkable. 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 He would like the flu and pneumonia vaccine while here. He reports no trouble getting his medications. Patient is a low fall risk, no recent falls. He denies alcohol and/or substance abuse. He denies any history of physical/verbal/sexual abuse.    Patient oriented to the unit and safety maintained.

## 2022-12-28 NOTE — Progress Notes (Incomplete)
A 2 D 5 No pain.

## 2022-12-28 NOTE — Tx Team (Signed)
Initial Treatment Plan 12/28/2022 2:43 PM Jesse Jones [id number]    PATIENT STRESSORS: Financial difficulties   Health problems   Medication change or noncompliance     PATIENT STRENGTHS: Ability for insight  Active sense of humor  Average or above average intelligence  Capable of independent living  Communication skills  General fund of knowledge  Motivation for treatment/growth    PATIENT IDENTIFIED PROBLEMS:   Feeling better   Suicidal ideations  depression               DISCHARGE CRITERIA:  Ability to meet basic life and health needs Adequate post-discharge living arrangements Motivation to continue treatment in a less acute level of care Need for constant or close observation no longer present Safe-care adequate arrangements made Verbal commitment to aftercare and medication compliance  PRELIMINARY DISCHARGE PLAN: Attend PHP/IOP Outpatient therapy Participate in family therapy  PATIENT/FAMILY INVOLVEMENT: This treatment plan has been presented to and reviewed with the patient, Jesse Jones, and/or family member, .  The patient and family have been given the opportunity to ask questions and make suggestions.  Leonia Reader, RN 12/28/2022, 2:43 PM

## 2022-12-28 NOTE — ED Notes (Signed)
Safe transport called for pt

## 2022-12-28 NOTE — ED Notes (Signed)
Pt resting quietly. Reports that [employer] "helped my anxiety".

## 2022-12-28 NOTE — ED Notes (Signed)
Pt is awake and alert. He was given towels and supplies for shower.   Pt is being monitored with no distress noted.

## 2022-12-28 NOTE — ED Notes (Signed)
Pt sleeping@this time. Breathing even and unlabored. Will continue to monitor for safety 

## 2022-12-28 NOTE — Progress Notes (Signed)
Pt was accepted to [facility] 12/28/2022. Bed assignment: 406-1  Pt meets inpatient criteria per Elvin So, NP  Attending Physician will be Janine Limbo, MD  Report can be called to: - Adult unit: [phone]  Bed is ready now  Care Team Notified: [facility] Scharlene Gloss, RN, Elvin So, NP, and Drema Halon, RN  [place], [place]  12/28/2022 12:22 PM
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
## 2022-12-28 NOTE — Group Note (Signed)
Date:  12/28/2022 Time:  10:26 PM  Group Topic/Focus:  Wrap-Up Group:   The focus of this group is to help patients review their daily goal of treatment and discuss progress on daily workbooks.    Participation Level:  Active  Participation Quality:  Appropriate  Affect:  Appropriate  Cognitive:  Appropriate  Insight: Appropriate  Engagement in Group:  Engaged  Modes of Intervention:  Discussion and Support  Additional Comments:     [place] 12/28/2022, 10:26 PM

## 2022-12-29 DIAGNOSIS — F332 Major depressive disorder, recurrent severe without psychotic features: Secondary | ICD-10-CM

## 2022-12-29 MED ORDER — GABAPENTIN 300 MG PO CAPS
300.0000 mg | ORAL_CAPSULE | Freq: Three times a day (TID) | ORAL | Status: DC
  Administered 2022-12-29 – 2023-01-04 (×19): 300 mg via ORAL
  Filled 2022-12-29 (×24): qty 1

## 2022-12-29 NOTE — [facility] Group Notes (Signed)
[facility] Group Notes:  (Nursing/MHT/Case Management/Adjunct)  Date:  12/29/2022  Time:  9:29 PM  Type of Therapy:  Group Therapy  Participation Level:  Did Not Attend  Participation Quality:   na  Affect:   na  Cognitive:   na  Insight:  None  Engagement in Group:   na  Modes of Intervention:   na  Summary of Progress/Problems:  Pt did not attend group.  Orvan Falconer 12/29/2022, 9:29 PM

## 2022-12-29 NOTE — [facility] Counselor (Signed)
Adult Comprehensive Assessment  Patient ID: ONTERRIO RULEY, male   DOB: 25-Jul-1981, 42 y.o.   MRN: [id number]  Information Source: Information source: Patient  Current Stressors:  Patient states their primary concerns and needs for treatment are:: " Depression with SI thoughts of taking the whoel bottle of Advil " Patient states their goals for this hospitilization and ongoing recovery are:: " control my depression better " Educational / Learning stressors: "None" Employment / Job issues: "None" Family Relationships: " Having arguments Publishing copy / Lack of resources (include bankruptcy): "No" Housing / Lack of housing: "No" Physical health (include injuries & life threatening diseases): "leg tremors " Social relationships: "No" Substance abuse: "No" Bereavement / Loss: "No"  Living/Environment/Situation:  Living Arrangements: Alone Living conditions (as described by patient or guardian): Apatment/Curtice Who else lives in the home?: Alone How long has patient lived in current situation?: 4 years What is atmosphere in current home: Comfortable  Family History:  Marital status: Single Are you sexually active?: Yes What is your sexual orientation?: Heterosexual Has your sexual activity been affected by drugs, alcohol, medication, or emotional stress?: No Does patient have children?: No  Childhood History:  By whom was/is the patient raised?: Both parents Additional childhood history information: " Good " Description of patient's relationship with caregiver when they were a child: "great " Patient's description of current relationship with people who raised him/her: "great " How were you disciplined when you got in trouble as a child/adolescent?: Spankings and Groundings Does patient have siblings?: Yes Number of Siblings: 1 Description of patient's current relationship with siblings: Patient said that he is close with his brother Did patient suffer any  verbal/emotional/physical/sexual abuse as a child?: No Did patient suffer from severe childhood neglect?: No Has patient ever been sexually abused/assaulted/raped as an adolescent or adult?: No Was the patient ever a victim of a crime or a disaster?: No Has patient been affected by domestic violence as an adult?: No  Education:  Highest grade of school patient has completed: High School Currently a student?: No Learning disability?: Yes What learning problems does patient have?: ADHD  Employment/Work Situation:   Employment Situation: On disability 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 How Long has Patient Been on Disability: " about 8 years " Patient's Job has Been Impacted by Current Illness: No What is the Longest Time Patient has Held a Job?: 3 years 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" Has Patient ever Been in the [employer]?: No  Financial Resources:   Museum/gallery curator resources: [employer], [employer], [employer], Teacher, early years/pre Does patient have a Programmer, applications or guardian?: No  Alcohol/Substance Abuse:   What has been your use of drugs/alcohol within the last 12 months?: None- patient states that he has been sober for 8 years If attempted suicide, did drugs/alcohol play a role in this?: No If yes, describe treatment: N/A Has alcohol/substance abuse ever caused legal problems?: No  Social Support System:   Heritage manager System: None Type of faith/religion: Catholic How does patient's faith help to cope with current illness?: " I pray "  Leisure/Recreation:   Do You Have Hobbies?: Yes Leisure and Hobbies: Reading, art, and Xbox, mountain bike riding, and hiking  Strengths/Needs:   What is the patient's perception of their strengths?: "outgoing, helping others " Patient states they can use these personal strengths during their treatment to contribute to their recovery: " help others " Patient states these  barriers  may affect/interfere with their treatment: None Patient states these barriers may affect their return to the community: None Other important information patient would like considered in planning for their treatment: None  Discharge Plan:   Currently receiving community mental health services: Yes (From Whom) ([facility] ; declines therapist) Patient states concerns and preferences for aftercare planning are: Patient does not want a therapist this time Patient states they will know when they are safe and ready for discharge when: " being able to manage my depression and not feeling Suicidal " Does patient have access to transportation?: Yes Does patient have financial barriers related to discharge medications?: No Will patient be returning to same living situation after discharge?: Yes  Summary/Recommendations:   Summary and Recommendations (to be completed by the evaluator): Dalten Dembek is a 42 y/o caucasian male who was admitted to hospital with recurrent depresson that has worsened and causing him to have a SI plan to take a whole bottle of advil pills . Javier denies SI attempts just thoughts of wanting to OD and what ever else that comes to his mind. Patient was hospitalized two weeks ago with the same mental health concerns. Patient has a history of MDD, ADHD,Anxiety, GAD, and Severe Bipolar I etc. Patient states that his current stressors during this hospitalization is his relationship with his family and then his physical health, states that he has been having leg tremors that he has been worrying his family about. Patient denies any substance use and states that he has been sober for 8 years. Patient lives alone in his apartment in [place] and plans to return back. Patietnt is being followed by an outside provided at [facility]. Patient declined therapy services just want medication management follow up appointments to be scheduled once he  DC.While here, [facility] can benefit from crisis stabilization, medication management, therapeutic milieu, and referrals for services.   Sherre Lain. 12/29/2022

## 2022-12-29 NOTE — Group Note (Signed)
LCSW Group Therapy Note   Group Date: 12/29/2022 Start Time: 1000 End Time: 1100   Type of Therapy and Topic: Group Therapy: Relationship Check-Ins   Participation Level: Did Not Attend   Description Group:  In this group patients are encouraged to rate how well or not well they are able to improve their relationships in Beliefs and Values, Communication, Family and Friends, Publishing rights manager and Household, and Intimacy. Patients will be able to discuss and identify what is going well within these aspects and what is not. Patients will be able to find appropriate ways, solutions, and skills that will help them within the selected relationships category. Patients will be encouraged to share and reflect on why things within their relationships are not going so well and get feedback from the instructor or their peers.  This group will be solution focused and process-oriented with patients' participation in sharing and listening to their own and peers experience; along with receive support and advice on how to improve or change the circumstance that is known to be challenging in their relationships category (Beliefs and Values, Communication, Family and Friends, Publishing rights manager and Household, and Intimacy).   Therapeutic Goals:  Patient will identify their strengths and weakness within their Beliefs and Values, Communication, Family and Friends, Publishing rights manager and Household, and Intimacy relationships. Patient will identify reasons why and how they can improve their relationships.  Patient will identify how they can be supportive and honest to themselves and in their relationships.  Patient will be able to gain support and give support to others with similar challenges.   Summary of Patient Progress: Did Not Attend     Therapeutic Modalities:  Solution Focused Therapy Cognitive Behavioral Therapy  Psychodynamic Therapy  Dialectical Behavior Therapy   Silas Flood , [place]  12/29/2022  3:35 PM

## 2022-12-29 NOTE — [facility] Suicide Risk Assessment (Signed)
[facility] Admission Suicide Risk Assessment   Nursing information obtained from:  Patient Demographic factors:  Male, Caucasian, Living alone Current Mental Status:  Suicidal ideation indicated by patient, Suicide plan Loss Factors:  NA Historical Factors:  Prior suicide attempts, Family history of mental illness or substance abuse Risk Reduction Factors:  Sense of responsibility to family  Total Time spent with patient: 30 minutes Principal Problem: MDD (major depressive disorder) Diagnosis:  Principal Problem:   MDD (major depressive disorder)  Identifying information and reason for admission: The patient is a 42 year old Caucasian male with a long history of depression with suicidal ideations, history of ADHD/autism spectrum disorder and history of TBI who has been under regular care of a psychiatrist for many years.  He was admitted with the symptoms of depression and self-reported suicidal ideations with intent to overdose.  He was unable to contract for safety. History of present illness: The patient was recently discharged from this facility on 12/20/2022.  After a 12-day hospitalization.  He was referred to partial hospital program and apparently his first appointment was yesterday which he missed.  Medication adjustment was made during the last hospitalization and he was left on low-dose Klonopin 0.5 3 times daily and was started on Strattera and Lexapro.  He is followed up by a local psychiatrist and therapist.  Records indicate that the patient was and had a comprehensive clinical assessment on 12/26/2022 for leg tremors and having a falling out with his father.  He was placed on Requip for the tremors.  He also reportedly psych consult at the time and patient was returning home in stable condition.  The patient returned again to the ED on 12/27/2022, reporting depression and suicidal ideations in the context of having an argument with his parents.  As noted earlier he missed his partial  hospital program appointment.  When he was seen, he claimed that he had some thoughts of wanting to take an overdose on a bottle of Advil.  Subjective Data:  Since admission the patient has been fairly cooperative and appears to be resting well.  He slept 7 hours last night.  EXTR reports no behavioral problems and has been appropriate to situation although he has been depressed and has an anxious mood.  He rates his depression at a 5/10 and anxiety at a 2/10.  He denies any suicidal/homicidal ideations.  He denied any AVH.  He was contracting for safety.  The patient wanted to stay on his Klonopin and denied any side effects.  He also did not have any significant restless leg or tremors.  MSE: When seen for an evaluation today he was alert oriented and cooperative.  He is lying in bed endorsing some depression and claims " I am feeling better now".  He maintained fair to good eye contact.  His speech was normal without any obvious latency, looseness of associations or flight of ideas.  He appeared to be cognitively intact.  He denied any SI/HI/AVH.  He denied any paranoia or delusions.  He denied any racing thoughts.  He reports sleeping well.  He denied any adverse side effects from medications.  He is contracting for safety.   Continued Clinical Symptoms:  Alcohol Use Disorder Identification Test Final Score (AUDIT): 0 The "Alcohol Use Disorders Identification Test", Guidelines for Use in Primary Care, Second Edition.  World Pharmacologist [facility]). Score between 0-7:  no or low risk or alcohol related problems. Score between 8-15:  moderate risk of alcohol related problems. Score between 16-19:  high risk of alcohol related problems. Score 20 or above:  warrants further diagnostic evaluation for alcohol dependence and treatment.   CLINICAL FACTORS:   Bipolar Disorder:   Bipolar II Depression:   Impulsivity More than one psychiatric diagnosis Unstable or Poor Therapeutic  Relationship   Musculoskeletal: Strength & Muscle Tone: within normal limits Gait & Station: normal Patient leans: N/A  Psychiatric Specialty Exam:  Presentation  General Appearance:  Disheveled  Eye Contact: Fair  Speech: Clear and Coherent; Normal Rate  Speech Volume: Normal  Handedness: Right   Mood and Affect  Mood: Anxious; Depressed  Affect: Flat   Thought Process  Thought Processes: Coherent  Descriptions of Associations:Intact  Orientation:Full (Time, Place and Person)  Thought Content:Logical  History of Schizophrenia/Schizoaffective disorder:No  Duration of Psychotic Symptoms:No data recorded Hallucinations:Hallucinations: None  Ideas of Reference:None  Suicidal Thoughts:Suicidal Thoughts: Yes, Active SI Active Intent and/or Plan: With Plan  Homicidal Thoughts:Homicidal Thoughts: No   Sensorium  Memory: Immediate Fair  Judgment: Poor  Insight: Shallow   Executive Functions  Concentration: Fair  Attention Span: Fair  Recall: [employer] of Knowledge: Fair  Language: Fair   Psychomotor Activity  Psychomotor Activity: Psychomotor Activity: Normal   Assets  Assets: Communication Skills; Desire for Improvement; Financial Resources/Insurance; Housing; Resilience   Sleep  Sleep: Sleep: Poor    Physical Exam: Physical Exam Vitals and nursing note reviewed.    Review of Systems  Psychiatric/Behavioral:  Positive for depression. The patient is nervous/anxious.   All other systems reviewed and are negative.  Blood pressure 119/80, pulse 93, temperature (!) 97.5 F (36.4 C), temperature source Oral, resp. rate 18, height '6\' 2"'$  (1.88 m), weight 84.4 kg, SpO2 96 %. Body mass index is 23.88 kg/m.   COGNITIVE FEATURES THAT CONTRIBUTE TO RISK:  Polarized thinking and Thought constriction (tunnel vision)    SUICIDE RISK:   Moderate:  Frequent suicidal ideation with limited intensity, and duration, some  specificity in terms of plans, no associated intent, good self-control, limited dysphoria/symptomatology, some risk factors present, and identifiable protective factors, including available and accessible social support.  PLAN OF CARE: ASSESSMENT:  Diagnoses / Active Problems: Bipolar disorder by history Major depression with suicidal ideations History of ADHD   PLAN: Safety and Monitoring:  --  Voluntary admission to inpatient psychiatric unit for safety, stabilization and treatment  -- Daily contact with patient to assess and evaluate symptoms and progress in treatment  -- Patient's case to be discussed in multi-disciplinary team meeting  -- Observation Level : q15 minute checks  -- Vital signs:  q12 hours  -- Precautions: suicide, elopement, and assault  2. Psychiatric Diagnoses and Treatment:    --  The risks/benefits/side-effects/alternatives to this medication were discussed in detail with the patient and time was given for questions. The patient consents to medication trial.  -- FDA Resume home medications.  -- Metabolic profile and EKG monitoring obtained while on an atypical antipsychotic (BMI: Lipid Panel: HbgA1c: QTc:) as indicated.  -- Encouraged patient to participate in unit milieu and in scheduled group therapies   -- Short Term Goals: Ability to identify changes in lifestyle to reduce recurrence of condition will improve, Ability to verbalize feelings will improve, Ability to disclose and discuss suicidal ideas, Ability to demonstrate self-control will improve, Ability to identify and develop effective coping behaviors will improve, and Compliance with prescribed medications will improve  -- Long Term Goals: Improvement in symptoms so as ready for discharge    3. Medical Issues  Being Addressed:   Tobacco Use Disorder  -- Nicotine patch '21mg'$ /24 hours ordered  -- Smoking cessation encouraged  4. Discharge Planning:   -- Social work and case management to assist with  discharge planning and identification of hospital follow-up needs prior to discharge  -- Estimated LOS: 5-7 days  -- Discharge Concerns: Need to establish a safety plan; Medication compliance and effectiveness  -- Discharge Goals: Return home with outpatient referrals for mental health follow-up including medication management/psychotherapy   I certify that inpatient services furnished can reasonably be expected to improve the patient's condition.   Ranae Palms, MD 12/29/2022, 10:39 AM  Total Time Spent in Direct Patient Care:  I personally spent 30 minutes on the unit in direct patient care. The direct patient care time included face-to-face time with the patient, reviewing the patient's chart, communicating with other professionals, and coordinating care. Greater than 50% of this time was spent in counseling or coordinating care with the patient regarding goals of hospitalization, psycho-education, and discharge planning needs.   [facility] Psychiatrist

## 2022-12-29 NOTE — Progress Notes (Signed)
   12/28/22 2135  Psych Admission Type (Psych Patients Only)  Admission Status Voluntary  Psychosocial Assessment  Patient Complaints Anxiety;Depression  Eye Contact Fair  Facial Expression Animated  Affect Appropriate to circumstance  Speech Logical/coherent  Interaction Assertive  Motor Activity Other (Comment) (WDL)  Appearance/Hygiene Unremarkable  Behavior Characteristics Cooperative  Mood Depressed;Anxious;Pleasant  Thought Process  Coherency WDL  Content WDL  Delusions None reported or observed  Perception WDL  Hallucination None reported or observed  Judgment Limited  Confusion None  Danger to Self  Current suicidal ideation? Denies ("Not right now")  Agreement Not to Harm Self Yes  Description of Agreement Verbally contracts for safety.   Patient alert and oriented. Presenting appropriate to circumstance with a depressed, anxious mood. Denies SI, HI, AVH, and pain. Patient rates anxiety 2/10 and depression 5/10. Scheduled requip administered to patient, per provider orders. Support and encouragement provided. Routine safety checks conducted every 15 minutes. Patient verbally contracts for safety and remains safe on the unit.

## 2022-12-29 NOTE — Group Note (Unsigned)
Date:  12/29/2022 Time:  10:34 AM  Group Topic/Focus:  Goals Group:   The focus of this group is to help patients establish daily goals to achieve during treatment and discuss how the patient can incorporate goal setting into their daily lives to aide in recovery.     Participation Level:  {[facility] PARTICIPATION HD:[id number]  Participation Quality:  {[facility] PARTICIPATION QUALITY:22265}  Affect:  {[facility] AFFECT:22266}  Cognitive:  {[facility] COGNITIVE:22267}  Insight: {BHH Insight2:20797}  Engagement in Group:  {[facility] ENGAGEMENT IN JY:[id number]  Modes of Intervention:  {[facility] MODES OF INTERVENTION:22269}  Additional Comments:  ***  Jerrye Beavers 12/29/2022, 10:34 AM

## 2022-12-29 NOTE — Progress Notes (Signed)
   12/29/22 0555  15 Minute Checks  Location Bedroom  Visual Appearance Calm  Behavior Sleeping  Sleep (Behavioral Health Patients Only)  Calculate sleep? (Click Yes once per 24 hr at 0600 safety check) Yes  Documented sleep last 24 hours 7

## 2022-12-29 NOTE — H&P (Signed)
Psychiatric Admission Assessment Adult  Patient Identification: Jesse Jones MRN:  [id number] Date of Evaluation:  12/29/2022 Chief Complaint:  MDD (major depressive disorder) [F32.9] Principal Diagnosis: MDD (major depressive disorder) Diagnosis:  Principal Problem:   MDD (major depressive disorder)  Total Time spent with patient: 30 minutes  Identifying information and reason for admission: The patient is a 42 year old Caucasian male with a long history of depression with suicidal ideations, history of ADHD/autism spectrum disorder and history of TBI who has been under regular care of a psychiatrist for many years.  He was admitted with the symptoms of depression and self-reported suicidal ideations with intent to overdose.  He was unable to contract for safety. History of present illness: The patient was recently discharged from this facility on 12/20/2022.  After a 12-day hospitalization.  He was referred to partial hospital program and apparently his first appointment was yesterday which he missed.  Medication adjustment was made during the last hospitalization and he was left on low-dose Klonopin 0.5 3 times daily and was started on Strattera and Lexapro.  He is followed up by a local psychiatrist and therapist.   Records indicate that the patient was and had a comprehensive clinical assessment on 12/26/2022 for leg tremors and having a falling out with his father.  He was placed on Requip for the tremors.  He also reportedly psych consult at the time and patient was returning home in stable condition.   The patient returned again to the ED on 12/27/2022, reporting depression and suicidal ideations in the context of having an argument with his parents.  As noted earlier he missed his partial hospital program appointment.  When he was seen, he claimed that he had some thoughts of wanting to take an overdose on a bottle of Advil.   Subjective Data:  Since admission the patient has been fairly  cooperative and appears to be resting well.  He slept 7 hours last night.  EXTR reports no behavioral problems and has been appropriate to situation although he has been depressed and has an anxious mood.  He rates his depression at a 5/10 and anxiety at a 2/10.  He denies any suicidal/homicidal ideations.  He denied any AVH.  He was contracting for safety.  The patient wanted to stay on his Klonopin and denied any side effects.  He also did not have any significant restless leg or tremors.   MSE: When seen for an evaluation today he was alert oriented and cooperative.  He is lying in bed endorsing some depression and claims " I am feeling better now".  He maintained fair to good eye contact.  His speech was normal without any obvious latency, looseness of associations or flight of ideas.  He appeared to be cognitively intact.  He denied any SI/HI/AVH.  He denied any paranoia or delusions.  He denied any racing thoughts.  He reports sleeping well.  He denied any adverse side effects from medications.  He is contracting for safety.  On Chart Review:  Current Outpatient (Home) Medication List:  Clonazepam 0.5 mg p.o. 3 times daily Trazodone 50 mg p.o. at bedtime Strattera 25 mg, 2 tablets p.o. twice daily Lexapro 20 mg p.o. daily Gabapentin 300 mg twice daily Requip 0.25 mg at bedtime   Collateral Information: Pending at this time.  POA/Legal Guardian: Denies  Past Psychiatric Hx: Previous Psych Diagnoses: Bipolar disorder, mixed, multiple suicidal attempts in the past, history of ADHD, rule out autism spectrum disorder. Prior inpatient treatment: February  2024. Prior outpatient treatment: He sees an outpatient psychiatrist on a regular basis.  He missed his partial hospital program appointment. Psychotherapy hx: Unknown. History of suicide: History of multiple suicide attempts.  Last attempt by overdose on benzodiazepines. History of homicide: None Psychiatric medication history: As  documented Psychiatric medication compliance history: Fair to poor Neuromodulation history: None Current Psychiatrist: Kelli Hope, at [facility].  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   Depression    ED (erectile dysfunction) 05/12/2012   Hyperlipidemia, mild 04/18/2015   Insomnia    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   Outbursts of anger    Preventative health care 10/03/2011   Tobacco abuse 04/28/2011     Family History: Family History  Problem Relation Age of Onset   Depression Mother     Anxiety disorder Mother     Depression Brother     Diabetes Brother          type 1   Cancer Paternal Grandmother          lung/ didn't smoke   Cancer Maternal Uncle 63     Social History: Social History   Socioeconomic History   Marital status: Single    Spouse name: Not on file   Number of children: 0   Years of education: 12   Highest education level: 12th grade  Occupational History   Not on file  Tobacco Use   Smoking status: Former    Packs/day: 0.30    Years: 20.00    Total pack years: 6.00    Types: Cigarettes    Passive exposure: Current   Smokeless tobacco: Never   Tobacco comments:    Discussed Smoking Cessation and Alcohol and Drug Services  Vaping Use   Vaping Use: Never used  Substance and Sexual Activity   Alcohol use: Yes    Alcohol/week: 36.0 standard drinks of alcohol    Types: 36 Cans of beer per week    Comment: sober x53yr No alcohol.   Drug use: Yes    Types: Marijuana, Other-see  comments    Comment: pt stopped 4 year ago   Sexual activity: Yes    Partners: Female    Comment: lives alone and eating well. exercising  Other Topics Concern   Not on file  Social History Narrative   Not on file   Social Determinants of Health   Financial Resource Strain: Low Risk  (03/02/2022)   Overall Financial Resource Strain (CARDIA)    Difficulty of Paying Living Expenses: Not hard at all  Food Insecurity: No Food Insecurity (12/28/2022)   Hunger Vital Sign    Worried About Running Out of Food in the Last Year: Never true    Ran Out of Food in the Last Year: Never true  Transportation Needs: No Transportation Needs (12/28/2022)   PRAPARE - THydrologist(Medical): No    Lack of Transportation (Non-Medical): No  Physical Activity: Inactive (03/02/2022)   Exercise Vital Sign    Days of Exercise  per Week: 0 days    Minutes of Exercise per Session: 0 min  Stress: Stress Concern Present (03/02/2022)   [place]    Feeling of Stress : Very much  Social Connections: Socially Isolated (03/02/2022)   Social Connection and Isolation Panel [NHANES]    Frequency of Communication with Friends and Family: More than three times a week    Frequency of Social Gatherings with Friends and Family: More than three times a week    Attends Religious Services: Never    Marine scientist or Organizations: No    Attends Archivist Meetings: Never    Marital Status: Never married  Intimate Partner Violence: Not At Risk (12/28/2022)   Humiliation, Afraid, Rape, and Kick questionnaire    Fear of Current or Ex-Partner: No    Emotionally Abused: No    Physically Abused: No    Sexually Abused: No     Is the patient at risk to self? Yes.    Has the patient been a risk to self in the past 6 months? Yes.    Has the patient been a risk to self within the distant past? Yes.    Is the patient a risk to  others? No.  Has the patient been a risk to others in the past 6 months? No.  Has the patient been a risk to others within the distant past? No.    Alcohol Screening:  1. How often do you have a drink containing alcohol?: Never 2. How many drinks containing alcohol do you have on a typical day when you are drinking?: 1 or 2 3. How often do you have six or more drinks on one occasion?: Never AUDIT-C Score: 0 4. How often during the last year have you found that you were not able to stop drinking once you had started?: Never 5. How often during the last year have you failed to do what was normally expected from you because of drinking?: Never 6. How often during the last year have you needed a first drink in the morning to get yourself going after a heavy drinking session?: Never 7. How often during the last year have you had a feeling of guilt of remorse after drinking?: Never 8. How often during the last year have you been unable to remember what happened the night before because you had been drinking?: Never 9. Have you or someone else been injured as a result of your drinking?: No 10. Has a relative or friend or a doctor or another health worker been concerned about your drinking or suggested you cut down?: No Alcohol Use Disorder Identification Test Final Score (AUDIT): 0 Alcohol Brief Interventions/Follow-up: Alcohol education/Brief advice Substance Abuse History in the last 12 months:  No. 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   Depression    ED (erectile dysfunction) 05/12/2012   Hyperlipidemia, mild  04/18/2015   Insomnia    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   Outbursts of anger    Preventative health care 10/03/2011   Tobacco abuse 04/28/2011    Past  Surgical History:  Procedure Laterality Date   NASAL SEPTUM SURGERY Bilateral    Dr Redmond Baseman 2017   punctured lung     toe surgeries  during childhood    for ingrown toenails   Family History:  Family History  Problem Relation Age of Onset   Depression Mother    Anxiety disorder Mother    Depression Brother    Diabetes Brother        type 1   Cancer Paternal Grandmother        lung/ didn't smoke   Cancer Maternal Uncle 68       colon cancer    Tobacco Screening:   Social History:  Social History   Substance and Sexual Activity  Alcohol Use Yes   Alcohol/week: 36.0 standard drinks of alcohol   Types: 36 Cans of beer per week   Comment: sober x29yr No alcohol.     Social History   Substance and Sexual Activity  Drug Use Yes   Types: Marijuana, Other-see comments   Comment: pt stopped 4 year ago    Additional Social History:  Allergies:   Allergies  Allergen Reactions   Haloperidol Decanoate Other (See Comments)    Muscles tense up, makes neck lock up and caused his head to go to the side choking him, muscle spams   Hydroxyzine Anxiety and Other (See Comments)    EXCESSIVE ANXIOUSNESS   Penicillins Hives   Valium Other (See Comments)    "Made me really crazy"   Lab Results:  Results for orders placed or performed during the hospital encounter of 12/27/22 (from the past 48 hour(s))  Resp panel by RT-PCR (RSV, Flu A&B, Covid) Anterior Nasal Swab     Status: None   Collection Time: 12/27/22  8:46 PM   Specimen: Anterior Nasal Swab  Result Value Ref Range   SARS Coronavirus 2 by RT PCR NEGATIVE NEGATIVE   Influenza A by PCR NEGATIVE NEGATIVE   Influenza B by PCR NEGATIVE NEGATIVE    Comment: (NOTE) The Xpert Xpress SARS-CoV-2/FLU/RSV plus assay is intended as an aid in the diagnosis of influenza from  Nasopharyngeal swab specimens and should not be used as a sole basis for treatment. Nasal washings and aspirates are unacceptable for Xpert Xpress SARS-CoV-2/FLU/RSV testing.  Fact Sheet for Patients: hEntrepreneurPulse.com.au Fact Sheet for Healthcare Providers: hIncredibleEmployment.be This test is not yet approved or cleared by the [place] and has been authorized for detection and/or diagnosis of SARS-CoV-2 by FDA under an Emergency Use Authorization (EUA). This EUA will remain in effect (meaning this test can be used) for the duration of the COVID-19 declaration under Section 564(b)(1) of the Act, 21 U.S.C. section 360bbb-3(b)(1), unless the authorization is terminated or revoked.     Resp Syncytial Virus by PCR NEGATIVE NEGATIVE    Comment: (NOTE) Fact Sheet for Patients: hEntrepreneurPulse.com.au Fact Sheet for Healthcare Providers: hIncredibleEmployment.be This test is not yet approved or cleared by the [place] and has been authorized for detection and/or diagnosis of SARS-CoV-2 by FDA under an Emergency Use Authorization (EUA). This EUA will remain in effect (meaning this test can be used) for the duration of the COVID-19 declaration under Section 564(b)(1) of the Act, 21 U.S.C. section 360bbb-3(b)(1), unless the authorization is terminated or revoked.  Performed at [facility] [address], [place] [address]  POCT Urine Drug Screen - (I-Screen)     Status: None (Preliminary result)  Collection Time: 12/27/22  8:46 PM  Result Value Ref Range   POC Amphetamine UR None Detected NONE DETECTED (Cut Off Level 1000 ng/mL)   POC Secobarbital (BAR) None Detected NONE DETECTED (Cut Off Level 300 ng/mL)   POC Buprenorphine (BUP) None Detected NONE DETECTED (Cut Off Level 10 ng/mL)   POC Oxazepam (BZO) None Detected NONE DETECTED (Cut Off Level 300 ng/mL)   POC  Cocaine UR None Detected NONE DETECTED (Cut Off Level 300 ng/mL)   POC Methamphetamine UR None Detected NONE DETECTED (Cut Off Level 1000 ng/mL)   POC Morphine None Detected NONE DETECTED (Cut Off Level 300 ng/mL)   POC Methadone UR None Detected NONE DETECTED (Cut Off Level 300 ng/mL)   POC Oxycodone UR None Detected NONE DETECTED (Cut Off Level 100 ng/mL)   POC Marijuana UR None Detected NONE DETECTED (Cut Off Level 50 ng/mL)    Blood Alcohol level:  Lab Results  Component Value Date   ETH 14 (H) 12/26/2022   ETH <11 [id number]    Metabolic Disorder Labs:  Lab Results  Component Value Date   HGBA1C 4.6 (L) 12/09/2022   MPG 85.32 12/09/2022   MPG 97 06/23/2020   No results found for: "PROLACTIN" Lab Results  Component Value Date   CHOL 191 12/09/2022   TRIG 42 12/09/2022   HDL 60 12/09/2022   CHOLHDL 3.2 12/09/2022   VLDL 8 12/09/2022   LDLCALC 123 (H) 12/09/2022   LDLCALC 109 (H) 01/31/2022    Current Medications: Current Facility-Administered Medications  Medication Dose Route Frequency Provider Last Rate Last Admin   acetaminophen (TYLENOL) tablet 650 mg  650 mg Oral Q6H PRN Tharon Aquas, NP       alum & mag hydroxide-simeth (MAALOX/MYLANTA) 200-200-20 MG/5ML suspension 30 mL  30 mL Oral Q4H PRN Tharon Aquas, NP       atomoxetine (STRATTERA) capsule 25 mg  25 mg Oral BID WC Tharon Aquas, NP   25 mg at 12/28/22 1642   clonazePAM (KLONOPIN) tablet 0.5 mg  0.5 mg Oral Q8H Massengill, Nathan, MD   0.5 mg at 12/29/22 [id number]   escitalopram (LEXAPRO) tablet 20 mg  20 mg Oral Daily Tharon Aquas, NP       gabapentin (NEURONTIN) capsule 300 mg  300 mg Oral BID Tharon Aquas, NP   300 mg at 12/28/22 1642   influenza vac split quadrivalent PF (FLUARIX) injection 0.5 mL  0.5 mL Intramuscular Tomorrow-1000 Massengill, Ovid Curd, MD       magnesium hydroxide (MILK OF MAGNESIA) suspension 30 mL  30 mL Oral Daily PRN Tharon Aquas, NP        multivitamin with minerals tablet 1 tablet  1 tablet Oral Daily Tharon Aquas, NP   1 tablet at 12/28/22 1642   OLANZapine zydis (ZYPREXA) disintegrating tablet 5 mg  5 mg Oral Q8H PRN Tharon Aquas, NP       And   ziprasidone (GEODON) injection 20 mg  20 mg Intramuscular PRN Tharon Aquas, NP       pneumococcal 20-valent conjugate vaccine (PREVNAR 20) injection 0.5 mL  0.5 mL Intramuscular Tomorrow-1000 Massengill, Nathan, MD       rOPINIRole (REQUIP) tablet 0.25 mg  0.25 mg Oral QHS Tharon Aquas, NP   0.25 mg at 12/28/22 2141   traZODone (DESYREL) tablet 50 mg  50 mg Oral QHS PRN Tharon Aquas, NP   50 mg at 12/28/22 2142   PTA Medications:  Medications Prior to Admission  Medication Sig Dispense Refill Last Dose   atomoxetine (STRATTERA) 25 MG capsule Take 1 capsule (25 mg total) by mouth 2 (two) times daily with a meal. 60 capsule 0    clonazePAM (KLONOPIN) 0.5 MG tablet Take 1 tablet (0.5 mg total) by mouth 3 (three) times daily. 30 tablet 0    escitalopram (LEXAPRO) 20 MG tablet Take 1 tablet daily 30 tablet 0    fish oil-omega-3 fatty acids 1000 MG capsule Take 2 g by mouth daily.      fluticasone (FLONASE) 50 MCG/ACT nasal spray SPRAY 2 SPRAYS INTO BOTH NOSTRILS DAILY AS NEEDED FOR ALLERGIES OR RHINITIS. (Patient taking differently: Place 2 sprays into both nostrils daily.) 48 mL 2    gabapentin (NEURONTIN) 300 MG capsule Take 1 capsule (300 mg total) by mouth 2 (two) times daily. 60 capsule 0    Multiple Vitamin (MULTIVITAMIN WITH MINERALS) TABS tablet Take 1 tablet by mouth daily.      rOPINIRole (REQUIP) 0.25 MG tablet Take 1 tablet (0.25 mg total) by mouth at bedtime. 30 tablet 1    traZODone (DESYREL) 50 MG tablet Take 1 tablet (50 mg total) by mouth at bedtime. 30 tablet 0     Musculoskeletal: Strength & Muscle Tone: within normal limits Gait & Station: normal Patient leans: N/A    Psychiatric Specialty Exam:  Presentation  General  Appearance: Disheveled    Eye Contact:Fair    Speech:Clear and Coherent; Normal Rate    Speech Volume:Normal    Handedness:Right    Mood and Affect  Mood:Anxious; Depressed    Affect:Flat     Thought Process  Thought Processes:Coherent    Duration of Psychotic Symptoms: No data recorded  Past Diagnosis of Schizophrenia or Psychoactive disorder: No   Descriptions of Associations:Intact    Orientation:Full (Time, Place and Person)    Thought Content:Logical    Hallucinations:Hallucinations: None    Ideas of Reference:None    Suicidal Thoughts:Suicidal Thoughts: Yes, Active SI Active Intent and/or Plan: With Plan    Homicidal Thoughts:Homicidal Thoughts: No     Sensorium  Memory:Immediate Fair    Judgment:Poor    Insight:Shallow     Executive Functions  Concentration:Fair    Attention Span:Fair    [place]     Psychomotor Activity  Psychomotor Activity:Psychomotor Activity: Normal     Assets  Assets:Communication Skills; Desire for Improvement; Financial Resources/Insurance; Housing; Resilience     Sleep  Sleep:Sleep: Poor      Physical Exam: Physical Exam Vitals and nursing note reviewed.  Constitutional:      Appearance: Normal appearance.  HENT:     Head: Normocephalic.  Neurological:     General: No focal deficit present.     Mental Status: He is alert and oriented to person, place, and time. Mental status is at baseline.  Psychiatric:        Mood and Affect: Mood normal.        Behavior: Behavior normal.        Thought Content: Thought content normal.    Review of Systems  Psychiatric/Behavioral:  Positive for depression and suicidal ideas. The patient is nervous/anxious.   All other systems reviewed and are negative.  Blood pressure 119/80, pulse 93, temperature (!) 97.5 F (36.4 C), temperature source Oral, resp. rate 18,  height '6\' 2"'$  (1.88 m), weight 84.4 kg, SpO2 96 %. Body mass index is 23.88 kg/m.   ASSESSMENT:  Principal Problem:   MDD (major depressive disorder)   [facility] day 1.   Treatment Plan Summary: Daily contact with patient to assess and evaluate symptoms and progress in treatment and Medication management  Physician Treatment Plan for Principal and Active Diagnoses: Long Term Goal(s): Improvement in symptoms so as ready for discharge  Short Term Goals: Ability to identify changes in lifestyle to reduce recurrence of condition will improve, Ability to verbalize feelings will improve, Ability to disclose and discuss suicidal ideas, Ability to demonstrate self-control will improve, and Compliance with prescribed medications will improve    I certify that inpatient services furnished can reasonably be expected to improve the patient's condition.    Assessment:  Diagnoses / Active Problems:  Safety and Monitoring: voluntarily admission to inpatient psychiatric unit for safety, stabilization and treatment Daily contact with patient to assess and evaluate symptoms and progress in treatment Patient's case to be discussed in multi-disciplinary team meeting Observation Level : q15 minute checks Vital signs: q12 hours Precautions: suicide, elopement, and assault  2. Psychiatric Diagnoses and Treatment #  PRN:   -- The risks/benefits/side-effects/alternatives to this medication were discussed in detail with the patient and time was given for questions. The patient consents to medication trial.              -- Metabolic profile and EKG monitoring obtained while on an atypical antipsychotic  BMI:  TSH: Lipid Panel:  HbgA1c:  QTc:              -- Encouraged patient to participate in unit milieu and in scheduled group therapies     3. Medical Issues Being Addressed: The patient is restarted on the current medications.  Will continue to monitor side effects and is concerned about increasing  depression which may be more situational.   4. Discharge Planning:              -- Social work and case management to assist with discharge planning and identification of hospital follow-up needs prior to discharge             -- Estimated LOS: 5-7 days             -- Discharge Concerns: Need to establish a safety plan; Medication compliance and effectiveness             -- Discharge Goals: Return home with outpatient referrals for mental health follow-up including medication management/psychotherapy    Ranae Palms, MD 3/2/202411:00 AM   Total Time Spent in Direct Patient Care:  I personally spent 30 minutes on the unit in direct patient care. The direct patient care time included face-to-face time with the patient, reviewing the patient's chart, communicating with other professionals, and coordinating care. Greater than 50% of this time was spent in counseling or coordinating care with the patient regarding goals of hospitalization, psycho-education, and discharge planning needs.   Amorita Psychiatrist

## 2022-12-30 NOTE — Progress Notes (Signed)
D: Pt denied SI/HI/AVH this morning. Pt rated his depression a 3/10, anxiety a 7/10. Pt has been isolative to his room for the entire day, only leaving to take medications. Pt has been cooperative throughout the shift.   A: RN provided support and encouragement to patient. Pt given scheduled medications as prescribed. Q15 min checks verified for safety.    R: Patient verbally contracts for safety. Patient compliant with medications and treatment plan.  Pt is safe on the unit.   12/30/22 1200  Psych Admission Type (Psych Patients Only)  Admission Status Voluntary  Psychosocial Assessment  Patient Complaints Anxiety;Depression (Pt  rates his depression 3/10 and anxiety 7/10)  Eye Contact Fair  Facial Expression Anxious  Affect Anxious  Speech Logical/coherent  Interaction Isolative  Motor Activity Fidgety  Appearance/Hygiene Unremarkable  Behavior Characteristics Unwilling to participate  Mood Depressed;Anxious  Thought Process  Coherency WDL  Content WDL  Delusions None reported or observed  Perception WDL  Hallucination None reported or observed  Judgment Impaired  Confusion None  Danger to Self  Current suicidal ideation? Denies  Description of Suicide Plan No plan  Self-Injurious Behavior No self-injurious ideation or behavior indicators observed or expressed   Agreement Not to Harm Self Yes  Description of Agreement Verbal  Danger to Others  Danger to Others None reported or observed

## 2022-12-30 NOTE — Progress Notes (Signed)
[facility] Notes:  (Nursing/MHT/Case Management/Adjunct)  Date:  12/30/2022  Time:  2000  Type of Therapy:   wrap up group  Participation Level:  Active  Participation Quality:  Appropriate, Attentive, Sharing, and Supportive  Affect:  Appropriate  Cognitive:  Alert  Insight:  Improving  Engagement in Group:  Engaged  Modes of Intervention:  Clarification, Education, and Support  Summary of Progress/Problems: Positive thinking and positive change were discussed.   Shellia Cleverly 12/30/2022, 9:40 PM

## 2022-12-30 NOTE — Progress Notes (Signed)
[facility] MD Progress Note  12/30/2022 11:26 AM Jesse Jones  MRN:  [id number]  Identifying information and reason for admission: The patient is a 42 year old Caucasian male with a long history of depression with suicidal ideations, history of ADHD/autism spectrum disorder and history of TBI who has been under regular care of a psychiatrist for many years.  He was admitted with the symptoms of depression and self-reported suicidal ideations with intent to overdose.  He was unable to contract for safety. History of present illness: The patient was recently discharged from this facility on 12/20/2022.  After a 12-day hospitalization.  He was referred to partial hospital program and apparently his first appointment was yesterday which he missed.  Medication adjustment was made during the last hospitalization and he was left on low-dose Klonopin 0.5 3 times daily and was started on Strattera and Lexapro.  He is followed up by a local psychiatrist and therapist.   Records indicate that the patient was and had a comprehensive clinical assessment on 12/26/2022 for leg tremors and having a falling out with his father.  He was placed on Requip for the tremors.  He also reportedly psych consult at the time and patient was returning home in stable condition.   The patient returned again to the ED on 12/27/2022, reporting depression and suicidal ideations in the context of having an argument with his parents.  As noted earlier he missed his partial hospital program appointment.  When he was seen, he claimed that he had some thoughts of wanting to take an overdose on a bottle of Advil.  Last 24hrs: Staff reports that the patient has been sleeping fairly well but has not been actively participating in groups.  No agitation or behavioral issues noted.  He is been compliant with medications and has not been demanding of medications other than when asked specifically.  Patient was restarted on his medications.  He is generally  compliant but apparently refused clonazepam last night.  Subjective: The patient was seen today and reinterviewed, he continues to endorse thoughts of depression with passive suicidal ideations however he was a lot more vocal today.  He reports that his symptoms vary from his frustration at his father monitoring his medication and giving him medication 1 week at a time.  He states that he is not a 51-year-old and he does not need his father to monitor him.  He also reported that he had leg cramps when he was at home but ever since he came to the hospital and has been taking his medication regularly has not had any leg cramps and he feels great.  Although he made a weak attempt to ask for more clonazepam and Adderall, when asked about his symptoms he said that his depression was a 6/10, his suicidal ideations was 0/10 and his anxiety was at 3/10.  He states that he wanted his father to know that he missed him.  He is also Taking his medications and did not have any specific complaints today.  Contacted patient's parents to obtain additional collateral information.  Parents indicate that the patient has not been as social and has been very somatic but seems to respond well to ER visits and to the hospitalization.  There is somewhat frustrated that his lack of activity and still concerned about the potential for abusing his clonazepam.  He now gets medications once every 4 to 7 days from his father.   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   Depression    ED (erectile dysfunction) 05/12/2012   Hyperlipidemia, mild 04/18/2015   Insomnia    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   Outbursts of anger    Preventative health care 10/03/2011   Tobacco abuse 04/28/2011    Past Surgical History:  Procedure Laterality Date   NASAL SEPTUM SURGERY Bilateral    Dr Redmond Baseman 2017   punctured lung     toe surgeries  during childhood    for ingrown toenails   Family History:  Family History  Problem Relation Age of Onset   Depression Mother    Anxiety disorder Mother    Depression Brother    Diabetes Brother        type 1   Cancer Paternal Grandmother        lung/ didn't smoke   Cancer Maternal Uncle 49       colon cancer   Family Psychiatric  History: Please see H&P Social History:  Social History   Substance and Sexual Activity  Alcohol Use Yes   Alcohol/week: 36.0 standard drinks of alcohol   Types: 36 Cans of beer per week   Comment: sober x60yr No alcohol.     Social History   Substance and Sexual Activity  Drug Use Yes   Types: Marijuana, Other-see comments   Comment: pt stopped 4 year ago    Social History   Socioeconomic History   Marital status: Single    Spouse name: Not on file   Number of children: 0   Years of education: 12   Highest education level: 12th grade  Occupational History   Not on file  Tobacco Use   Smoking status: Former    Packs/day: 0.30    Years: 20.00    Total pack years: 6.00    Types: Cigarettes    Passive exposure: Current   Smokeless tobacco: Never   Tobacco comments:    Discussed Smoking Cessation and Alcohol and Drug Services  Vaping Use   Vaping Use: Never used  Substance and Sexual Activity   Alcohol use: Yes    Alcohol/week: 36.0 standard drinks of alcohol    Types: 36 Cans of beer per week    Comment: sober x460yrNo alcohol.   Drug use: Yes    Types: Marijuana, Other-see comments    Comment: pt stopped 4 year ago   Sexual activity: Yes    Partners:  Female    Comment: lives alone and eating well. exercising  Other Topics Concern   Not on file  Social History Narrative   Not on file   Social Determinants of Health   Financial Resource Strain: Low Risk  (03/02/2022)   Overall Financial Resource Strain (CARDIA)    Difficulty of Paying Living Expenses: Not hard at all  Food Insecurity: No Food Insecurity (12/28/2022)   Hunger Vital Sign    Worried About Running Out of Food in the Last Year: Never true    Ran Out of Food in the Last Year: Never true  Transportation Needs: No  Transportation Needs (12/28/2022)   PRAPARE - Hydrologist (Medical): No    Lack of Transportation (Non-Medical): No  Physical Activity: Inactive (03/02/2022)   Exercise Vital Sign    Days of Exercise per Week: 0 days    Minutes of Exercise per Session: 0 min  Stress: Stress Concern Present (03/02/2022)   [place]    Feeling of Stress : Very much  Social Connections: Socially Isolated (03/02/2022)   Social Connection and Isolation Panel [NHANES]    Frequency of Communication with Friends and Family: More than three times a week    Frequency of Social Gatherings with Friends and Family: More than three times a week    Attends Religious Services: Never    Marine scientist or Organizations: No    Attends Music therapist: Never    Marital Status: Never married   Additional Social History:                         Sleep: Good  Appetite:  Good  Current Medications: Current Facility-Administered Medications  Medication Dose Route Frequency Provider Last Rate Last Admin   acetaminophen (TYLENOL) tablet 650 mg  650 mg Oral Q6H PRN Tharon Aquas, NP       alum & mag hydroxide-simeth (MAALOX/MYLANTA) 200-200-20 MG/5ML suspension 30 mL  30 mL Oral Q4H PRN Tharon Aquas, NP       atomoxetine (STRATTERA) capsule 25 mg  25 mg Oral BID WC  Tharon Aquas, NP   25 mg at 12/30/22 0752   clonazePAM (KLONOPIN) tablet 0.5 mg  0.5 mg Oral Q8H Massengill, Nathan, MD   0.5 mg at 12/30/22 [id number]   escitalopram (LEXAPRO) tablet 20 mg  20 mg Oral Daily Tharon Aquas, NP   20 mg at 12/30/22 [id number]   gabapentin (NEURONTIN) capsule 300 mg  300 mg Oral TID Ranae Palms, MD   300 mg at 12/30/22 [id number]   magnesium hydroxide (MILK OF MAGNESIA) suspension 30 mL  30 mL Oral Daily PRN Tharon Aquas, NP       multivitamin with minerals tablet 1 tablet  1 tablet Oral Daily Tharon Aquas, NP   1 tablet at 12/30/22 0752   OLANZapine zydis (ZYPREXA) disintegrating tablet 5 mg  5 mg Oral Q8H PRN Tharon Aquas, NP       And   ziprasidone (GEODON) injection 20 mg  20 mg Intramuscular PRN Tharon Aquas, NP       rOPINIRole (REQUIP) tablet 0.25 mg  0.25 mg Oral QHS Tharon Aquas, NP   0.25 mg at 12/28/22 2141   traZODone (DESYREL) tablet 50 mg  50 mg Oral QHS PRN Tharon Aquas, NP   50 mg at 12/28/22 2142    Lab Results: No results found for this or any previous visit (from the past 7 hour(s)).  Blood Alcohol level:  Lab Results  Component Value Date   ETH 14 (H) 12/26/2022   ETH <11 [id number]    Metabolic Disorder Labs: Lab Results  Component Value Date   HGBA1C 4.6 (L) 12/09/2022   MPG 85.32 12/09/2022   MPG 97 06/23/2020   No results found for: "PROLACTIN" Lab Results  Component Value Date   CHOL 191 12/09/2022   TRIG 42 12/09/2022   HDL 60 12/09/2022   CHOLHDL 3.2 12/09/2022   VLDL 8 12/09/2022  LDLCALC 123 (H) 12/09/2022   LDLCALC 109 (H) 01/31/2022    Physical Findings: AIMS: Facial and Oral Movements Muscles of Facial Expression: None, normal Lips and Perioral Area: None, normal Jaw: None, normal Tongue: None, normal,Extremity Movements Upper (arms, wrists, hands, fingers): None, normal Lower (legs, knees, ankles, toes): None, normal, Trunk Movements Neck, shoulders, hips: None,  normal, Overall Severity Severity of abnormal movements (highest score from questions above): None, normal Incapacitation due to abnormal movements: None, normal Patient's awareness of abnormal movements (rate only patient's report): No Awareness, Dental Status Current problems with teeth and/or dentures?: No Does patient usually wear dentures?: No  CIWA:    COWS:     Musculoskeletal: Strength & Muscle Tone: within normal limits Gait & Station: normal Patient leans: N/A  Psychiatric Specialty Exam:  Presentation  General Appearance:  Appropriate for Environment  Eye Contact: Fair  Speech: Clear and Coherent  Speech Volume: Decreased  Handedness: Right   Mood and Affect  Mood: Anxious  Affect: Constricted   Thought Process  Thought Processes: Linear  Descriptions of Associations:Intact  Orientation:Full (Time, Place and Person)  Thought Content:Perseveration; Rumination  History of Schizophrenia/Schizoaffective disorder:No  Duration of Psychotic Symptoms:No data recorded Hallucinations:Hallucinations: None  Ideas of Reference:None  Suicidal Thoughts:Suicidal Thoughts: No  Homicidal Thoughts:Homicidal Thoughts: No   Sensorium  Memory: Immediate Fair; Remote Fair; Recent Fair  Judgment: Fair  Insight: Fair   Community education officer  Concentration: Fair  Attention Span: Fair  Recall: [employer] of Knowledge: Fair  Language: Fair   Psychomotor Activity  Psychomotor Activity: Psychomotor Activity: Decreased   Assets  Assets: Desire for Improvement; Communication Skills; Financial Resources/Insurance; Housing  Sleep: Sleep: Good Number of Hours of Sleep: 11.5    Physical Exam: Physical Exam ROS Blood pressure 106/81, pulse (!) 116, temperature 97.6 F (36.4 C), temperature source Oral, resp. rate 18, height '6\' 2"'$  (1.88 m), weight 84.4 kg, SpO2 97 %. Body mass index is 23.88 kg/m.   Treatment Plan Summary: Daily  contact with patient to assess and evaluate symptoms and progress in treatment and Medication management  PLAN: Safety and Monitoring:             --  Voluntary admission to inpatient psychiatric unit for safety, stabilization and treatment             -- Daily contact with patient to assess and evaluate symptoms and progress in treatment             -- Patient's case to be discussed in multi-disciplinary team meeting             -- Observation Level : q15 minute checks             -- Vital signs:  q12 hours             -- Precautions: suicide, elopement, and assault   2. Psychiatric Diagnoses and Treatment:               --  The risks/benefits/side-effects/alternatives to this medication were discussed in detail with the patient and time was given for questions. The patient consents to medication trial.  -- FDA Resume home medications. The patient has been restarted on Strattera 50 mg twice a day with meals Clonazepam 0.5 mg p.o. 3 times daily Escitalopram 20 mg a day Gabapentin increased to 300 mg 3 times a day Requip 0.25 mg at bedtime             -- Metabolic profile and  EKG monitoring obtained while on an atypical antipsychotic (BMI: Lipid Panel: HbgA1c: QTc:) as indicated.             -- Encouraged patient to participate in unit milieu and in scheduled group therapies              -- Short Term Goals: Ability to identify changes in lifestyle to reduce recurrence of condition will improve, Ability to verbalize feelings will improve, Ability to disclose and discuss suicidal ideas, Ability to demonstrate self-control will improve, Ability to identify and develop effective coping behaviors will improve, and Compliance with prescribed medications will improve             -- Long Term Goals: Improvement in symptoms so as ready for discharge                3. Medical Issues Being Addressed:              Tobacco Use Disorder             -- Nicotine patch '21mg'$ /24 hours ordered              -- Smoking cessation encouraged   4. Discharge Planning:              -- Social work and case management to assist with discharge planning and identification of hospital follow-up needs prior to discharge             -- Estimated LOS: 5-7 days             -- Discharge Concerns: Need to establish a safety plan; Medication compliance and effectiveness             -- Discharge Goals: Return home with outpatient referrals for mental health follow-up including medication management/psychotherapy     I certify that inpatient services furnished can reasonably be expected to improve the patient's condition.    Ranae Palms, MD 12/30/2022, 11:26 AM

## 2022-12-30 NOTE — Progress Notes (Signed)
   12/30/22 0555  15 Minute Checks  Location Bedroom  Visual Appearance Calm  Behavior Sleeping  Sleep (Behavioral Health Patients Only)  Calculate sleep? (Click Yes once per 24 hr at 0600 safety check) Yes  Documented sleep last 24 hours 11.5

## 2022-12-30 NOTE — Assessment & Plan Note (Deleted)
encouraged heart healthy diet, avoid trans fats, minimize simple carbs and saturated fats. Increase exercise as tolerated 

## 2022-12-30 NOTE — Assessment & Plan Note (Deleted)
Presented to ED with suicidal ideation last week, is now following with [facility]

## 2022-12-30 NOTE — [facility] Group Notes (Signed)
[facility] Group Notes:  (Nursing)  Date:  12/30/2022  Time:  400 PM  Type of Therapy:  Music Therapy  Participation Level:  Did Not Attend   Waymond Cera 12/30/2022, 7:44 PM

## 2022-12-30 NOTE — [facility] Group Notes (Signed)
[facility] Group Notes:  (Nursing/MHT/Case Management/Adjunct)  Date:  12/30/2022  Time:  11:20 AM  Type of Therapy:  Group Therapy  Participation Level:  Did Not Attend  Summary of Progress/Problems:  Patient did not attend orientation/ goals group.   Elza Rafter 12/30/2022, 11:20 AM

## 2022-12-30 NOTE — Assessment & Plan Note (Deleted)
hgba1c acceptable, minimize simple carbs. Increase exercise as tolerated.  

## 2022-12-31 ENCOUNTER — Telehealth: Payer: Medicare Other | Admitting: Family Medicine

## 2022-12-31 ENCOUNTER — Encounter (HOSPITAL_COMMUNITY): Payer: Self-pay

## 2022-12-31 DIAGNOSIS — E785 Hyperlipidemia, unspecified: Secondary | ICD-10-CM

## 2022-12-31 DIAGNOSIS — F332 Major depressive disorder, recurrent severe without psychotic features: Secondary | ICD-10-CM

## 2022-12-31 DIAGNOSIS — R739 Hyperglycemia, unspecified: Secondary | ICD-10-CM

## 2022-12-31 MED ORDER — CLONAZEPAM 0.5 MG PO TABS
0.5000 mg | ORAL_TABLET | Freq: Three times a day (TID) | ORAL | Status: DC
  Administered 2022-12-31 – 2023-01-04 (×12): 0.5 mg via ORAL
  Filled 2022-12-31 (×12): qty 1

## 2022-12-31 NOTE — Plan of Care (Signed)
  Problem: Safety: Goal: Periods of time without injury will increase Outcome: Progressing   

## 2022-12-31 NOTE — Plan of Care (Signed)
  Problem: Education: Goal: Knowledge of [facility] Education information/materials will improve Outcome: Progressing Goal: Emotional status will improve Outcome: Progressing Goal: Mental status will improve Outcome: Progressing Goal: Verbalization of understanding the information provided will improve Outcome: Progressing   Problem: Activity: Goal: Interest or engagement in activities will improve Outcome: Progressing Goal: Sleeping patterns will improve Outcome: Progressing   

## 2022-12-31 NOTE — BH IP Treatment Plan (Signed)
Interdisciplinary Treatment and Diagnostic Plan Update  12/31/2022 Time of Session: 11:30 AM  Jesse Jones MRN: [id number]  Principal Diagnosis: MDD (major depressive disorder)  Secondary Diagnoses: Principal Problem:   MDD (major depressive disorder)   Current Medications:  Current Facility-Administered Medications  Medication Dose Route Frequency Provider Last Rate Last Admin   acetaminophen (TYLENOL) tablet 650 mg  650 mg Oral Q6H PRN Tharon Aquas, NP       alum & mag hydroxide-simeth (MAALOX/MYLANTA) 200-200-20 MG/5ML suspension 30 mL  30 mL Oral Q4H PRN Tharon Aquas, NP       atomoxetine (STRATTERA) capsule 25 mg  25 mg Oral BID WC Tharon Aquas, NP   25 mg at 12/31/22 0830   clonazePAM (KLONOPIN) tablet 0.5 mg  0.5 mg Oral Q8H Massengill, Nathan, MD   0.5 mg at 12/31/22 1302   escitalopram (LEXAPRO) tablet 20 mg  20 mg Oral Daily Tharon Aquas, NP   20 mg at 12/31/22 0830   gabapentin (NEURONTIN) capsule 300 mg  300 mg Oral TID Ranae Palms, MD   300 mg at 12/31/22 1303   magnesium hydroxide (MILK OF MAGNESIA) suspension 30 mL  30 mL Oral Daily PRN Tharon Aquas, NP       multivitamin with minerals tablet 1 tablet  1 tablet Oral Daily Tharon Aquas, NP   1 tablet at 12/31/22 0830   OLANZapine zydis (ZYPREXA) disintegrating tablet 5 mg  5 mg Oral Q8H PRN Tharon Aquas, NP       And   ziprasidone (GEODON) injection 20 mg  20 mg Intramuscular PRN Tharon Aquas, NP       rOPINIRole (REQUIP) tablet 0.25 mg  0.25 mg Oral QHS Tharon Aquas, NP   0.25 mg at 12/30/22 2207   traZODone (DESYREL) tablet 50 mg  50 mg Oral QHS PRN Tharon Aquas, NP   50 mg at 12/30/22 2207   PTA Medications: Medications Prior to Admission  Medication Sig Dispense Refill Last Dose   atomoxetine (STRATTERA) 25 MG capsule Take 1 capsule (25 mg total) by mouth 2 (two) times daily with a meal. 60 capsule 0    clonazePAM (KLONOPIN) 0.5 MG tablet  Take 1 tablet (0.5 mg total) by mouth 3 (three) times daily. 30 tablet 0    escitalopram (LEXAPRO) 20 MG tablet Take 1 tablet daily 30 tablet 0    fish oil-omega-3 fatty acids 1000 MG capsule Take 2 g by mouth daily.      fluticasone (FLONASE) 50 MCG/ACT nasal spray SPRAY 2 SPRAYS INTO BOTH NOSTRILS DAILY AS NEEDED FOR ALLERGIES OR RHINITIS. (Patient taking differently: Place 2 sprays into both nostrils daily.) 48 mL 2    gabapentin (NEURONTIN) 300 MG capsule Take 1 capsule (300 mg total) by mouth 2 (two) times daily. 60 capsule 0    Multiple Vitamin (MULTIVITAMIN WITH MINERALS) TABS tablet Take 1 tablet by mouth daily.      rOPINIRole (REQUIP) 0.25 MG tablet Take 1 tablet (0.25 mg total) by mouth at bedtime. 30 tablet 1    traZODone (DESYREL) 50 MG tablet Take 1 tablet (50 mg total) by mouth at bedtime. 30 tablet 0     Patient Stressors: Financial difficulties   Health problems   Medication change or noncompliance    Patient Strengths: Ability for insight  Active sense of humor  Average or above average intelligence  Capable of independent living  Communication skills  General fund of knowledge  Motivation for treatment/growth   Treatment Modalities: Medication Management, Group therapy, Case management,  1 to 1 session with clinician, Psychoeducation, Recreational therapy.   Physician Treatment Plan for Primary Diagnosis: MDD (major depressive disorder) Long Term Goal(s): Improvement in symptoms so as ready for discharge   Short Term Goals: Ability to identify changes in lifestyle to reduce recurrence of condition will improve Ability to verbalize feelings will improve Ability to disclose and discuss suicidal ideas Ability to demonstrate self-control will improve Compliance with prescribed medications will improve  Medication Management: Evaluate patient's response, side effects, and tolerance of medication regimen.  Therapeutic Interventions: 1 to 1 sessions, Unit Group  sessions and Medication administration.  Evaluation of Outcomes: Progressing  Physician Treatment Plan for Secondary Diagnosis: Principal Problem:   MDD (major depressive disorder)  Long Term Goal(s): Improvement in symptoms so as ready for discharge   Short Term Goals: Ability to identify changes in lifestyle to reduce recurrence of condition will improve Ability to verbalize feelings will improve Ability to disclose and discuss suicidal ideas Ability to demonstrate self-control will improve Compliance with prescribed medications will improve     Medication Management: Evaluate patient's response, side effects, and tolerance of medication regimen.  Therapeutic Interventions: 1 to 1 sessions, Unit Group sessions and Medication administration.  Evaluation of Outcomes: Progressing   RN Treatment Plan for Primary Diagnosis: MDD (major depressive disorder) Long Term Goal(s): Knowledge of disease and therapeutic regimen to maintain health will improve  Short Term Goals: Ability to remain free from injury will improve, Ability to verbalize frustration and anger appropriately will improve, Ability to demonstrate self-control, Ability to participate in decision making will improve, Ability to verbalize feelings will improve, Ability to disclose and discuss suicidal ideas, Ability to identify and develop effective coping behaviors will improve, and Compliance with prescribed medications will improve  Medication Management: RN will administer medications as ordered by provider, will assess and evaluate patient's response and provide education to patient for prescribed medication. RN will report any adverse and/or side effects to prescribing provider.  Therapeutic Interventions: 1 on 1 counseling sessions, Psychoeducation, Medication administration, Evaluate responses to treatment, Monitor vital signs and CBGs as ordered, Perform/monitor CIWA, COWS, AIMS and Fall Risk screenings as ordered, Perform  wound care treatments as ordered.  Evaluation of Outcomes: Progressing   LCSW Treatment Plan for Primary Diagnosis: MDD (major depressive disorder) Long Term Goal(s): Safe transition to appropriate next level of care at discharge, Engage patient in therapeutic group addressing interpersonal concerns.  Short Term Goals: Engage patient in aftercare planning with referrals and resources, Increase social support, Increase ability to appropriately verbalize feelings, Increase emotional regulation, Facilitate acceptance of mental health diagnosis and concerns, Facilitate patient progression through stages of change regarding substance use diagnoses and concerns, Identify triggers associated with mental health/substance abuse issues, and Increase skills for wellness and recovery  Therapeutic Interventions: Assess for all discharge needs, 1 to 1 time with Social worker, Explore available resources and support systems, Assess for adequacy in community support network, Educate family and significant other(s) on suicide prevention, Complete Psychosocial Assessment, Interpersonal group therapy.  Evaluation of Outcomes: Progressing   Progress in Treatment: Attending groups: No. Participating in groups: No. Taking medication as prescribed: Yes. Toleration medication: Yes. Family/Significant other contact made: Yes, individual(s) contacted:  Jerediah Luecht (Dad) [phone] Patient understands diagnosis: Yes. Discussing patient identified problems/goals with staff: Yes. Medical problems stabilized or resolved: Yes. Denies suicidal/homicidal ideation: Yes. Issues/concerns per patient self-inventory: No.   New problem(s) identified: No, Describe:  None Reported   New Short Term/Long Term Goal(s): medication stabilization, elimination of SI thoughts, development of comprehensive mental wellness plan.    Patient Goals:  " Not feel depressed anymore, get the his medications working , and get back to  riding his bike "   Discharge Plan or Barriers: Patient recently admitted. CSW will continue to follow and assess for appropriate referrals and possible discharge planning.    Reason for Continuation of Hospitalization: Anxiety Depression Medication stabilization Suicidal ideation  Estimated Length of Stay: 5-7 days   Last 3 [place] Suicide Severity Risk Score: [place] Admission (Current) from 12/28/2022 in [place] [facility] ED from 12/27/2022 in [facility] ED from 12/25/2022 in [facility] Emergency Department at [place] High Risk High Risk Moderate Risk       Last PHQ 2/9 Scores:    12/25/2022    9:21 AM 04/20/2022   11:07 AM 03/02/2022   11:42 AM  Depression screen PHQ 2/9  Decreased Interest '2 3 3  '$ Down, Depressed, Hopeless '2 3 3  '$ PHQ - 2 Score '4 6 6  '$ Altered sleeping '2 3 3  '$ Tired, decreased energy '2 3 2  '$ Change in appetite 2 3 0  Feeling bad or failure about yourself  '2 3 3  '$ Trouble concentrating '2 3 3  '$ Moving slowly or fidgety/restless  3 3  Suicidal thoughts 1 1 0  PHQ-9 Score '15 25 20  '$ Difficult doing work/chores Somewhat difficult Extremely dIfficult Extremely dIfficult    Scribe for Treatment Team: Sherre Lain, LCSWA 12/31/2022 1:51 PM

## 2022-12-31 NOTE — [facility] Group Notes (Signed)
Patient did not attend the charades group.

## 2022-12-31 NOTE — Progress Notes (Signed)
   12/31/22 2055  Psych Admission Type (Psych Patients Only)  Admission Status Voluntary  Psychosocial Assessment  Patient Complaints Anxiety;Depression  Eye Contact Fair  Facial Expression Anxious  Affect Anxious  Speech Logical/coherent  Interaction Isolative  Motor Activity Restless  Appearance/Hygiene Unremarkable  Behavior Characteristics Calm  Mood Anxious  Thought Process  Coherency WDL  Content WDL  Delusions None reported or observed  Perception WDL  Hallucination None reported or observed  Judgment Impaired  Confusion None  Danger to Self  Current suicidal ideation? Denies  Self-Injurious Behavior No self-injurious ideation or behavior indicators observed or expressed   Agreement Not to Harm Self Yes  Description of Agreement verbal  Danger to Others  Danger to Others None reported or observed   Alert/oriented. Makes needs/concerns known to staff. Pleasant cooperative with staff. Denies SI/HI/A/V hallucinations. Med compliant.  Patient states went to group. Will encourage continue compliance and progression towards goals. Verbally contracted for safety. Will continue to monitor.

## 2022-12-31 NOTE — Progress Notes (Signed)
   12/31/22 0800  Psych Admission Type (Psych Patients Only)  Admission Status Voluntary  Psychosocial Assessment  Patient Complaints Anxiety;Depression  Eye Contact Fair  Facial Expression Anxious  Affect Anxious  Speech Logical/coherent  Interaction Isolative  Motor Activity Restless  Appearance/Hygiene Unremarkable  Behavior Characteristics Calm  Mood Anxious  Aggressive Behavior  Targets Self  Type of Behavior Verbal  Effect No apparent injury  Thought Process  Coherency WDL  Content WDL  Delusions None reported or observed  Perception WDL  Hallucination None reported or observed  Judgment Impaired  Confusion None  Danger to Self  Current suicidal ideation? Denies  Self-Injurious Behavior No self-injurious ideation or behavior indicators observed or expressed   Agreement Not to Harm Self Yes  Description of Agreement Verbal  Danger to Others  Danger to Others None reported or observed

## 2022-12-31 NOTE — [facility] Group Notes (Signed)
Patient did not attend the Resiliency group.

## 2022-12-31 NOTE — [facility] Group Notes (Signed)
Spiritual care group on grief and loss facilitated by Chaplain Janne Napoleon, Bcc and Lysle Morales, counseling intern.  Group Goal: Support / Education around grief and loss  Members engage in facilitated group support and psycho-social education.  Group Description:  Following introductions and group rules, group members engaged in facilitated group dialogue and support around topic of loss, with particular support around experiences of loss in their lives. Group Identified types of loss (relationships / self / things) and identified patterns, circumstances, and changes that precipitate losses. Reflected on thoughts / feelings around loss, normalized grief responses, and recognized variety in grief experience. Group encouraged individual reflection on safe space and on the coping skills that they are already utilizing.  Group drew on Adlerian / Rogerian and narrative framework  Patient Progress: Did not attend.

## 2022-12-31 NOTE — Group Note (Signed)
Recreation Therapy Group Note   Group Topic:Stress Management  Group Date: 12/31/2022 Start Time: 0940 End Time: 0957 Facilitators: Pearlena Ow-McCall, LRT,CTRS Location: [address] Dayroom   Goal Area(s) Addresses:  Patient will identify positive stress management techniques. Patient will identify benefits of using stress management post d/c.  Group Description:  Meditation.  LRT and patients discussed ways to handle stress.  LRT played a meditation that focused on making the most of your day and encouraging each person to have the confidence and courage to know they can change their environment and self-esteem.    Affect/Mood: N/A   Participation Level: Did not attend    Clinical Observations/Individualized Feedback:     Plan: Continue to engage patient in RT group sessions 2-3x/week.   Kmya Placide-McCall, LRT,CTRS 12/31/2022 12:56 PM

## 2022-12-31 NOTE — Progress Notes (Signed)
   12/31/22 0200  Psych Admission Type (Psych Patients Only)  Admission Status Voluntary  Psychosocial Assessment  Patient Complaints Anxiety;Depression  Eye Contact Fair  Facial Expression Anxious  Affect Anxious  Speech Logical/coherent  Interaction Isolative  Motor Activity Restless  Appearance/Hygiene Unremarkable  Behavior Characteristics Calm  Mood Anxious  Thought Process  Coherency WDL  Content WDL  Delusions None reported or observed  Perception WDL  Hallucination None reported or observed  Judgment Impaired  Confusion None  Danger to Self  Current suicidal ideation? Denies  Self-Injurious Behavior No self-injurious ideation or behavior indicators observed or expressed   Agreement Not to Harm Self Yes  Description of Agreement verbal  Danger to Others  Danger to Others None reported or observed   Alert/oriented . Makes needs/concerns known to staff. Pleasant cooperative with staff. Denies SI/HI/A/V hallucinations. Med compliant. Patient states went to group. Will encourage continue compliance and progression towards goals. Verbally contracted for safety. Will continue to monitor.

## 2022-12-31 NOTE — [facility] Suicide Risk Assessment (Signed)
[place] INPATIENT:  Family/Significant Other Suicide Prevention Education  Suicide Prevention Education:  Education Completed; Jesse Jones [phone] (Father) has been identified by the patient as the family member/significant other with whom the patient will be residing, and identified as the person(s) who will aid the patient in the event of a mental health crisis (suicidal ideations/suicide attempt).  With written consent from the patient, the family member/significant other has been provided the following suicide prevention education, prior to the and/or following the discharge of the patient.  The suicide prevention education provided includes the following: Suicide risk factors Suicide prevention and interventions National Suicide Hotline telephone number [facility] assessment telephone number [facility] Emergency Assistance [place] and/or Residential Mobile Crisis Unit telephone number  Request made of family/significant other to: Remove weapons (e.g., guns, rifles, knives), all items previously/currently identified as safety concern.   Remove drugs/medications (over-the-counter, prescriptions, illicit drugs), all items previously/currently identified as a safety concern.  The family member/significant other verbalizes understanding of the suicide prevention education information provided.  The family member/significant other agrees to remove the items of safety concern listed above.  CSW spoke with Jesse Jones who states that his son "came to the hospital because his Klonopin were not making the progress the wanted so he doubled up on them and ran out but [facility] wouldn't refill it that soon".  He confirms that his son can return to his own apartment after discharge.  Jesse Jones states that he is a support for his son and that he lives within walking distance to his son.  He states that there are no firearms or weapons in his son's home.  Jesse Jones did inquire about  a group home placement but states that his son is able to care for himself and "his only problem is he takes more of his Klonopin then he should".  CSW discussed substance use treatment centers and criteria for group home placement with Jesse Jones.  CSW completed SPE with Jesse Jones 12/31/2022, 1:40 PM

## 2022-12-31 NOTE — [facility] Group Notes (Signed)
[place] Group Notes:  (Nursing/MHT/Case Management/Adjunct)  Date:  12/31/2022  Time:  8:45 PM  Type of Therapy:   AA  Participation Level:  Active  Participation Quality:  Appropriate  Affect:  Appropriate  Cognitive:  Appropriate  Insight:  Appropriate  Engagement in Group:  Engaged  Modes of Intervention:  Education  Summary of Progress/Problems:  Pt attended [place] group.  Orvan Falconer 12/31/2022, 8:45 PM

## 2022-12-31 NOTE — Progress Notes (Signed)
[facility] MD Progress Note  12/31/2022 3:58 PM Jesse Jones  MRN:  [id number]  Identifying information and reason for admission: The patient is a 42 year old Caucasian male with a long history of depression with suicidal ideations, history of ADHD/autism spectrum disorder and history of TBI who has been under regular care of a psychiatrist for many years.  He was admitted with the symptoms of depression and self-reported suicidal ideations with intent to overdose.  He was unable to contract for safety. History of present illness:  The patient was recently discharged from this facility on 12/20/2022.  After a 12-day hospitalization.  He was referred to partial hospital program and apparently his first appointment was yesterday which he missed.  Medication adjustment was made during the last hospitalization and he was left on low-dose Klonopin 0.5 3 times daily and was started on Strattera and Lexapro.  He is followed up by a local psychiatrist and therapist.   Last 24hrs: Staff reports that the patient has been Sleeping well.  He reported about 7-1/2 hours but still feels that he is not rested and is focused on the Klonopin.  He has been socially isolated and withdrawn and endorsing depression.  Staff reports no as needed medications.  He is compliant with treatment.  Subjective: The patient was seen today and was interviewed.  He also participated in the treatment team.  He reported that his goal was to get rid of his depression and to be more active.  He wanted his Klonopin given 3 times a day as every 8 hours.  He claims that he wants to feel more positive.  He did list his activities including playing golf with his mom which he enjoyed.  Today he rates his depression still at a 6/10 but anxiety is low and he denies any suicidal ideations. On mental status examination patient is alert oriented cooperative and maintained good eye contact.  His speech is coherent without any obvious looseness of  associations or flight of ideas or tangentiality.  Patient is contracting for safety.  He denies psychosis.  No active SI/HI/AVH noted.   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   Depression    ED (erectile dysfunction) 05/12/2012   Hyperlipidemia, mild 04/18/2015   Insomnia    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   Outbursts of anger    Preventative health care 10/03/2011   Tobacco abuse 04/28/2011    Past Surgical History:  Procedure Laterality Date   NASAL SEPTUM SURGERY Bilateral    Dr Redmond Baseman 2017   punctured lung     toe surgeries  during childhood    for ingrown toenails   Family History:  Family History  Problem Relation Age of Onset   Depression Mother    Anxiety disorder Mother    Depression Brother    Diabetes Brother        type 1   Cancer Paternal Grandmother        lung/ didn't smoke   Cancer Maternal Uncle 14  colon cancer   Family Psychiatric  History: Please see H&P Social History:  Social History   Substance and Sexual Activity  Alcohol Use Yes   Alcohol/week: 36.0 standard drinks of alcohol   Types: 36 Cans of beer per week   Comment: sober x24yr No alcohol.     Social History   Substance and Sexual Activity  Drug Use Yes   Types: Marijuana, Other-see comments   Comment: pt stopped 4 year ago    Social History   Socioeconomic History   Marital status: Single    Spouse name: Not on file   Number of children: 0   Years of education: 12   Highest education level: 12th grade   Occupational History   Not on file  Tobacco Use   Smoking status: Former    Packs/day: 0.30    Years: 20.00    Total pack years: 6.00    Types: Cigarettes    Passive exposure: Current   Smokeless tobacco: Never   Tobacco comments:    Discussed Smoking Cessation and Alcohol and Drug Services  Vaping Use   Vaping Use: Never used  Substance and Sexual Activity   Alcohol use: Yes    Alcohol/week: 36.0 standard drinks of alcohol    Types: 36 Cans of beer per week    Comment: sober x452yrNo alcohol.   Drug use: Yes    Types: Marijuana, Other-see comments    Comment: pt stopped 4 year ago   Sexual activity: Yes    Partners: Female    Comment: lives alone and eating well. exercising  Other Topics Concern   Not on file  Social History Narrative   Not on file   Social Determinants of Health   Financial Resource Strain: Low Risk  (03/02/2022)   Overall Financial Resource Strain (CARDIA)    Difficulty of Paying Living Expenses: Not hard at all  Food Insecurity: No Food Insecurity (12/28/2022)   Hunger Vital Sign    Worried About Running Out of Food in the Last Year: Never true    Ran Out of Food in the Last Year: Never true  Transportation Needs: No Transportation Needs (12/28/2022)   PRAPARE - TrHydrologistMedical): No    Lack of Transportation (Non-Medical): No  Physical Activity: Inactive (03/02/2022)   Exercise Vital Sign    Days of Exercise per Week: 0 days    Minutes of Exercise per Session: 0 min  Stress: Stress Concern Present (03/02/2022)   [place]  Feeling of Stress : Very much  Social Connections: Socially Isolated (03/02/2022)   Social Connection and Isolation Panel [NHANES]    Frequency of Communication with Friends and Family: More than three times a week    Frequency of Social Gatherings with Friends and Family: More than three times a week    Attends Religious Services:  Never    AcMarine scientistr Organizations: No    Attends ClArchivisteetings: Never    Marital Status: Never married   Additional Social History:                         Sleep: Good  Appetite:  Good  Current Medications: Current Facility-Administered Medications  Medication Dose Route Frequency Provider Last Rate Last Admin   acetaminophen (TYLENOL) tablet 650 mg  650 mg Oral Q6H PRN LeTharon AquasNP  alum & mag hydroxide-simeth (MAALOX/MYLANTA) 200-200-20 MG/5ML suspension 30 mL  30 mL Oral Q4H PRN Tharon Aquas, NP       atomoxetine (STRATTERA) capsule 25 mg  25 mg Oral BID WC Tharon Aquas, NP   25 mg at 12/31/22 0830   clonazePAM (KLONOPIN) tablet 0.5 mg  0.5 mg Oral Q8H Massengill, Nathan, MD   0.5 mg at 12/31/22 1302   escitalopram (LEXAPRO) tablet 20 mg  20 mg Oral Daily Tharon Aquas, NP   20 mg at 12/31/22 0830   gabapentin (NEURONTIN) capsule 300 mg  300 mg Oral TID Ranae Palms, MD   300 mg at 12/31/22 1303   magnesium hydroxide (MILK OF MAGNESIA) suspension 30 mL  30 mL Oral Daily PRN Tharon Aquas, NP       multivitamin with minerals tablet 1 tablet  1 tablet Oral Daily Tharon Aquas, NP   1 tablet at 12/31/22 0830   OLANZapine zydis (ZYPREXA) disintegrating tablet 5 mg  5 mg Oral Q8H PRN Tharon Aquas, NP       And   ziprasidone (GEODON) injection 20 mg  20 mg Intramuscular PRN Tharon Aquas, NP       rOPINIRole (REQUIP) tablet 0.25 mg  0.25 mg Oral QHS Tharon Aquas, NP   0.25 mg at 12/30/22 2207   traZODone (DESYREL) tablet 50 mg  50 mg Oral QHS PRN Tharon Aquas, NP   50 mg at 12/30/22 2207    Lab Results: No results found for this or any previous visit (from the past 9 hour(s)).  Blood Alcohol level:  Lab Results  Component Value Date   ETH 14 (H) 12/26/2022   ETH <11 [id number]    Metabolic Disorder Labs: Lab Results  Component Value Date   HGBA1C 4.6 (L)  12/09/2022   MPG 85.32 12/09/2022   MPG 97 06/23/2020   No results found for: "PROLACTIN" Lab Results  Component Value Date   CHOL 191 12/09/2022   TRIG 42 12/09/2022   HDL 60 12/09/2022   CHOLHDL 3.2 12/09/2022   VLDL 8 12/09/2022   LDLCALC 123 (H) 12/09/2022   LDLCALC 109 (H) 01/31/2022    Physical Findings: AIMS: Facial and Oral Movements Muscles of Facial Expression: None, normal Lips and Perioral Area: None, normal Jaw: None, normal Tongue: None, normal,Extremity Movements Upper (arms, wrists, hands, fingers): None, normal Lower (legs, knees, ankles, toes): None, normal, Trunk Movements Neck, shoulders, hips: None, normal, Overall Severity Severity of abnormal movements (highest score from questions above): None, normal Incapacitation due to abnormal movements: None, normal Patient's awareness of abnormal movements (rate only patient's report): No Awareness, Dental Status Current problems with teeth and/or dentures?: No Does patient usually wear dentures?: No  CIWA:    COWS:     Musculoskeletal: Strength & Muscle Tone: within normal limits Gait & Station: normal Patient leans: N/A  Psychiatric Specialty Exam:  Presentation  General Appearance:  Appropriate for Environment  Eye Contact: Fair  Speech: Clear and Coherent  Speech Volume: Decreased  Handedness: Right   Mood and Affect  Mood: Anxious; Depressed  Affect: Blunt; Restricted   Thought Process  Thought Processes: Linear  Descriptions of Associations:Intact  Orientation:Full (Time, Place and Person)  Thought Content:Logical  History of Schizophrenia/Schizoaffective disorder:No  Duration of Psychotic Symptoms:No data recorded Hallucinations:Hallucinations: None  Ideas of Reference:None  Suicidal Thoughts:Suicidal Thoughts: No  Homicidal Thoughts:Homicidal Thoughts: No   Sensorium  Memory: Immediate Fair; Remote Fair; Recent  Fair  Judgment: Fair  Insight: Fair   Materials engineer: Fair  Attention Span: Fair  Recall: Smiley Houseman of Knowledge: Fair  Language: Fair   Psychomotor Activity  Psychomotor Activity: Psychomotor Activity: Normal   Assets  Assets: Armed forces logistics/support/administrative officer; Social Support  Sleep: Sleep: Fair Number of Hours of Sleep: 11.5    Physical Exam: Physical Exam ROS Blood pressure 108/77, pulse (!) 106, temperature 97.6 F (36.4 C), temperature source Oral, resp. rate 16, height '6\' 2"'$  (1.88 m), weight 84.4 kg, SpO2 97 %. Body mass index is 23.88 kg/m.   Treatment Plan Summary: Daily contact with patient to assess and evaluate symptoms and progress in treatment and Medication management  PLAN: Safety and Monitoring:             --  Voluntary admission to inpatient psychiatric unit for safety, stabilization and treatment             -- Daily contact with patient to assess and evaluate symptoms and progress in treatment             -- Patient's case to be discussed in multi-disciplinary team meeting             -- Observation Level : q15 minute checks             -- Vital signs:  q12 hours             -- Precautions: suicide, elopement, and assault   2. Psychiatric Diagnoses and Treatment:               --  The risks/benefits/side-effects/alternatives to this medication were discussed in detail with the patient and time was given for questions. The patient consents to medication trial.  -- FDA Resume home medications. The patient has been restarted on Strattera 50 mg twice a day with meals Clonazepam 0.5 mg p.o. 3 times a day instead of every 8 hours. Escitalopram 20 mg a day Gabapentin increased to 300 mg 3 times a day Requip 0.25 mg at bedtime             -- Metabolic profile and EKG monitoring obtained while on an atypical antipsychotic (BMI: Lipid Panel: HbgA1c: QTc:) as indicated.             -- Encouraged patient to participate in unit  milieu and in scheduled group therapies              -- Short Term Goals: Ability to identify changes in lifestyle to reduce recurrence of condition will improve, Ability to verbalize feelings will improve, Ability to disclose and discuss suicidal ideas, Ability to demonstrate self-control will improve, Ability to identify and develop effective coping behaviors will improve, and Compliance with prescribed medications will improve             -- Long Term Goals: Improvement in symptoms so as ready for discharge                3. Medical Issues Being Addressed:              Tobacco Use Disorder             -- Nicotine patch '21mg'$ /24 hours ordered             -- Smoking cessation encouraged   4. Discharge Planning:              -- Social work and case management to assist with discharge planning and identification of  hospital follow-up needs prior to discharge             -- Estimated LOS: 5-7 days             -- Discharge Concerns: Need to establish a safety plan; Medication compliance and effectiveness             -- Discharge Goals: Return home with outpatient referrals for mental health follow-up including medication management/psychotherapy     I certify that inpatient services furnished can reasonably be expected to improve the patient's condition.    Ranae Palms, MD 12/31/2022, 3:58 PMPatient ID: [name], male   DOB: 05/13/1981, 42 y.o.   MRN: [id number]

## 2022-12-31 NOTE — Group Note (Unsigned)
Date:  12/31/2022 Time:  10:23 AM  Group Topic/Focus:  Goals Group:   The focus of this group is to help patients establish daily goals to achieve during treatment and discuss how the patient can incorporate goal setting into their daily lives to aide in recovery. Orientation:   The focus of this group is to educate the patient on the purpose and policies of crisis stabilization and provide a format to answer questions about their admission.  The group details unit policies and expectations of patients while admitted.     Participation Level:  {BHH PARTICIPATION WO:[id number]  Participation Quality:  {BHH PARTICIPATION QUALITY:22265}  Affect:  {BHH AFFECT:22266}  Cognitive:  {BHH COGNITIVE:22267}  Insight: {BHH Insight2:20797}  Engagement in Group:  {BHH ENGAGEMENT IN BP:[id number]  Modes of Intervention:  {BHH MODES OF INTERVENTION:22269}  Additional Comments:  ***  Dub Mikes 12/31/2022, 10:23 AM

## 2023-01-01 DIAGNOSIS — F332 Major depressive disorder, recurrent severe without psychotic features: Secondary | ICD-10-CM

## 2023-01-01 NOTE — Group Note (Signed)
Date:  01/01/2023 Time:  10:15 AM  Group Topic/Focus:  Goals Group:   The focus of this group is to help patients establish daily goals to achieve during treatment and discuss how the patient can incorporate goal setting into their daily lives to aide in recovery. Orientation:   The focus of this group is to educate the patient on the purpose and policies of crisis stabilization and provide a format to answer questions about their admission.  The group details unit policies and expectations of patients while admitted.    Participation Level:  Did Not Attend   Iona Coach Chantavia Bazzle [id number], 10:15 AM

## 2023-01-01 NOTE — Group Note (Signed)
Date:  01/01/2023 Time:  10:45 AM  Group Topic/Focus:  Making Healthy Choices:   The focus of this group is to help patients identify negative/unhealthy choices they were using prior to admission and identify positive/healthier coping strategies to replace them upon discharge.    Participation Level:  Did Not Attend   Iona Coach Lorilynn Lehr [id number], 10:45 AM

## 2023-01-01 NOTE — Group Note (Unsigned)
Date:  01/01/2023 Time:  8:45 PM  Group Topic/Focus:  Wrap-Up Group:   The focus of this group is to help patients review their daily goal of treatment and discuss progress on daily workbooks.     Participation Level:  {BHH PARTICIPATION HD:[id number]  Participation Quality:  {BHH PARTICIPATION QUALITY:22265}  Affect:  {BHH AFFECT:22266}  Cognitive:  {BHH COGNITIVE:22267}  Insight: {BHH Insight2:20797}  Engagement in Group:  {BHH ENGAGEMENT IN JY:[id number]  Modes of Intervention:  {BHH MODES OF INTERVENTION:22269}  Additional Comments:  ***  Debe Coder 01/01/2023, 8:45 PM

## 2023-01-01 NOTE — Progress Notes (Signed)
   01/01/23 0800  Psych Admission Type (Psych Patients Only)  Admission Status Voluntary  Psychosocial Assessment  Patient Complaints Anxiety  Eye Contact Fair  Facial Expression Pensive  Affect Anxious  Speech Logical/coherent  Interaction Assertive  Motor Activity Fidgety  Appearance/Hygiene Unremarkable  Behavior Characteristics Cooperative  Mood Pleasant  Thought Process  Coherency WDL  Content WDL  Delusions None reported or observed  Perception WDL  Hallucination None reported or observed  Judgment Impaired  Confusion None  Danger to Self  Current suicidal ideation? Denies  Danger to Others  Danger to Others None reported or observed   Pt is pleasant upon approach and he has been calm this shift.  Pt has been isolative to his room today and has not been attending groups.   Pt took medications without incident.

## 2023-01-01 NOTE — Group Note (Signed)
LCSW Group Therapy Note   Group Date: 01/01/2023 Start Time: 1100 End Time: 1200  Type of Therapy and Topic:  Group Therapy:  Self-Care after Hospitalization  Participation Level:  Did Not Attend   Description of Group This process group involved patients discussing how they plan to take care of themselves in a better manner when they get home from the hospital.  The group started with patients listing one healthy self-care they hope to engage in at discharge that they did not use prior to admission.  We discussed a variety of other means of self-care which had a large range from hygiene activities to eating to setting boundaries to participating in peer support groups.  The primary focus by CSW was to point out commonalities.  When there were participants who stated everyone else can get help, but they themselves are "beyond help," this was discussed in detail and this belief was gently challenged.  Finally, it was announced that immediately following group was to be "Hygiene Hour" where everyone would go to their rooms and take care of their personal hygiene.  This was met with wide acceptance.  Therapeutic Goals Patient will identify and describe one self-care activity to deliberately plan to use upon hospital discharge Patient will participate in generating additional ideas about healthy self-care options when they return to the community Patients will be supportive of one another and receive support from others Patients will be challenged to realize that they are not "beyond help" any more than other participants in the room are  Summary of Patient Progress:  Did not attend    Therapeutic Modalities [place], [place] 01/01/2023  1:06 PM

## 2023-01-01 NOTE — Group Note (Unsigned)
Date:  01/01/2023 Time:  9:16 PM  Group Topic/Focus:  Wrap-Up Group:   The focus of this group is to help patients review their daily goal of treatment and discuss progress on daily workbooks.     Participation Level:  {BHH PARTICIPATION WO:[id number]  Participation Quality:  {BHH PARTICIPATION QUALITY:22265}  Affect:  {BHH AFFECT:22266}  Cognitive:  {BHH COGNITIVE:22267}  Insight: {BHH Insight2:20797}  Engagement in Group:  {BHH ENGAGEMENT IN BP:[id number]  Modes of Intervention:  {BHH MODES OF INTERVENTION:22269}  Additional Comments:  ***  Debe Coder 01/01/2023, 9:16 PM

## 2023-01-01 NOTE — Group Note (Unsigned)
Date:  01/01/2023 Time:  8:58 PM  Group Topic/Focus:  Wrap-Up Group:   The focus of this group is to help patients review their daily goal of treatment and discuss progress on daily workbooks.     Participation Level:  {BHH PARTICIPATION HD:[id number]  Participation Quality:  {BHH PARTICIPATION QUALITY:22265}  Affect:  {BHH AFFECT:22266}  Cognitive:  {BHH COGNITIVE:22267}  Insight: {BHH Insight2:20797}  Engagement in Group:  {BHH ENGAGEMENT IN JY:[id number]  Modes of Intervention:  {BHH MODES OF INTERVENTION:22269}  Additional Comments:  ***  Debe Coder 01/01/2023, 8:58 PM

## 2023-01-01 NOTE — Group Note (Signed)
Recreation Therapy Group Note   Group Topic:Animal Assisted Therapy   Group Date: 01/01/2023 Start Time: 1430 End Time: 1500 Facilitators: Jesse Jones, LRT,CTRS Location: [address] Dayroom   Animal-Assisted Activity (AAA) Program Checklist/Progress Notes Patient Eligibility Criteria Checklist & Daily Group note for Rec Tx Intervention  AAA/T Program Assumption of Risk Form signed by Patient/ or Parent Legal Guardian Yes  Patient understands his/her participation is voluntary Yes   Affect/Mood: N/A   Participation Level: Did not attend    Clinical Observations/Individualized Feedback:     Plan: Continue to engage patient in RT group sessions 2-3x/week.   Jesse Jones, LRT,CTRS 01/01/2023 4:13 PM

## 2023-01-01 NOTE — Group Note (Unsigned)
Date:  01/01/2023 Time:  8:54 PM  Group Topic/Focus:  Wrap-Up Group:   The focus of this group is to help patients review their daily goal of treatment and discuss progress on daily workbooks.     Participation Level:  {BHH PARTICIPATION WO:[id number]  Participation Quality:  {BHH PARTICIPATION QUALITY:22265}  Affect:  {BHH AFFECT:22266}  Cognitive:  {BHH COGNITIVE:22267}  Insight: {BHH Insight2:20797}  Engagement in Group:  {BHH ENGAGEMENT IN BP:[id number]  Modes of Intervention:  {BHH MODES OF INTERVENTION:22269}  Additional Comments:  ***  Debe Coder 01/01/2023, 8:54 PM

## 2023-01-01 NOTE — Group Note (Unsigned)
Date:  01/01/2023 Time:  8:56 PM  Group Topic/Focus:  Wrap-Up Group:   The focus of this group is to help patients review their daily goal of treatment and discuss progress on daily workbooks.     Participation Level:  {BHH PARTICIPATION WO:[id number]  Participation Quality:  {BHH PARTICIPATION QUALITY:22265}  Affect:  {BHH AFFECT:22266}  Cognitive:  {BHH COGNITIVE:22267}  Insight: {BHH Insight2:20797}  Engagement in Group:  {BHH ENGAGEMENT IN BP:[id number]  Modes of Intervention:  {BHH MODES OF INTERVENTION:22269}  Additional Comments:  ***  Jesse Jones 01/01/2023, 8:56 PM

## 2023-01-01 NOTE — Group Note (Unsigned)
Date:  01/01/2023 Time:  9:01 PM  Group Topic/Focus:  Wrap-Up Group:   The focus of this group is to help patients review their daily goal of treatment and discuss progress on daily workbooks.     Participation Level:  {BHH PARTICIPATION WO:[id number]  Participation Quality:  {BHH PARTICIPATION QUALITY:22265}  Affect:  {BHH AFFECT:22266}  Cognitive:  {BHH COGNITIVE:22267}  Insight: {BHH Insight2:20797}  Engagement in Group:  {BHH ENGAGEMENT IN BP:[id number]  Modes of Intervention:  {BHH MODES OF INTERVENTION:22269}  Additional Comments:  ***  Debe Coder 01/01/2023, 9:01 PM

## 2023-01-01 NOTE — Progress Notes (Signed)
[facility] MD Progress Note  01/01/2023 1:43 PM Jesse Jones  MRN:  [id number]  Identifying information and reason for admission: The patient is a 42 year old Caucasian male with a long history of depression with suicidal ideations, history of ADHD/autism spectrum disorder and history of TBI who has been under regular care of a psychiatrist for many years.  He was admitted with the symptoms of depression and self-reported suicidal ideations with intent to overdose.  He was unable to contract for safety. History of present illness:  The patient was recently discharged from this facility on 12/20/2022.  After a 12-day hospitalization.  He was referred to partial hospital program and apparently his first appointment was yesterday which he missed.  Medication adjustment was made during the last hospitalization and he was left on low-dose Klonopin 0.5 3 times daily and was started on Strattera and Lexapro.  He is followed up by a local psychiatrist and therapist.   Last 24hrs: Staff reports that the patient has not been a behavioral problem and has been sleeping fairly well.  Effectively tends to stay in his bed quite often and was not attending groups.  Participation in groups is minimal.  He is compliant with medications.  Subjective: The patient was seen today and was interviewed.  Other than the fact that he is in bed, he was alert oriented cooperative and pleasant and reports improved depression and improved anxiety.  He denies any problems with sleep.  Klonopin was changed to 3 times a day is up every 8 hours and he likes it.  We talked at length about all his different activities and hobbies.  In the past she used to do mountain biking at least 25 to 30 miles at a time, is to play golf with his mother and he would go to the shooting range with his father.  He was encouraged to at least set some goals after discharge to pursue some of his activities.  He is in agreement.  The plan is for possible discharge  by Thursday.  On mental status examination, the patient is alert oriented and cooperative and pleasant.  He denies any major depression or anxiety and he denies any acute signs suicidal or homicidal ideations.  Despite improved symptoms patient remains isolated and has not been participating in groups or milieu.   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   Depression    ED (erectile dysfunction) 05/12/2012   Hyperlipidemia, mild 04/18/2015   Insomnia    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   Outbursts of anger    Preventative health care 10/03/2011   Tobacco abuse 04/28/2011    Past Surgical History:  Procedure Laterality Date   NASAL SEPTUM SURGERY Bilateral    Dr Redmond Baseman 2017   punctured lung     toe surgeries  during childhood    for ingrown toenails   Family History:  Family History  Problem Relation Age of Onset   Depression Mother    Anxiety disorder Mother    Depression Brother  Diabetes Brother        type 1   Cancer Paternal Grandmother        lung/ didn't smoke   Cancer Maternal Uncle 25       colon cancer   Family Psychiatric  History: Please see H&P Social History:  Social History   Substance and Sexual Activity  Alcohol Use Yes   Alcohol/week: 36.0 standard drinks of alcohol   Types: 36 Cans of beer per week   Comment: sober x107yr No alcohol.     Social History   Substance and Sexual Activity  Drug Use Yes   Types: Marijuana, Other-see comments   Comment: pt stopped 4 year ago     Social History   Socioeconomic History   Marital status: Single    Spouse name: Not on file   Number of children: 0   Years of education: 12   Highest education level: 12th grade  Occupational History   Not on file  Tobacco Use   Smoking status: Former    Packs/day: 0.30    Years: 20.00    Total pack years: 6.00    Types: Cigarettes    Passive exposure: Current   Smokeless tobacco: Never   Tobacco comments:    Discussed Smoking Cessation and Alcohol and Drug Services  Vaping Use   Vaping Use: Never used  Substance and Sexual Activity   Alcohol use: Yes    Alcohol/week: 36.0 standard drinks of alcohol    Types: 36 Cans of beer per week    Comment: sober [phone] alcohol.   Drug use: Yes    Types: Marijuana, Other-see comments    Comment: pt stopped 4 year ago   Sexual activity: Yes    Partners: Female    Comment: lives alone and eating well. exercising  Other Topics Concern   Not on file  Social History Narrative   Not on file   Social Determinants of Health   Financial Resource Strain: Low Risk  (03/02/2022)   Overall Financial Resource Strain (CARDIA)    Difficulty of Paying Living Expenses: Not hard at all  Food Insecurity: No Food Insecurity (12/28/2022)   Hunger Vital Sign    Worried About Running Out of Food in the Last Year: Never true    Ran Out of Food in the Last Year: Never true  Transportation Needs: No Transportation Needs (12/28/2022)   PRAPARE - TrHydrologistMedical): No    Lack of Transportation (Non-Medical): No  Physical Activity: Inactive (03/02/2022)   Exercise Vital Sign    Days of Exercise per Week: 0 days    Minutes of Exercise per Session: 0 min  Stress: Stress Concern Present (03/02/2022)   [place]  Feeling of Stress : Very much  Social Connections: Socially Isolated (03/02/2022)   Social Connection and Isolation Panel [NHANES]    Frequency of  Communication with Friends and Family: More than three times a week    Frequency of Social Gatherings with Friends and Family: More than three times a week    Attends Religious Services: Never    AcMarine scientistr Organizations: No    Attends ClArchivisteetings: Never    Marital Status: Never married   Additional Social History:                         Sleep: Good  Appetite:  Good  Current Medications: Current Facility-Administered Medications  Medication Dose Route Frequency Provider Last Rate Last Admin   acetaminophen (TYLENOL) tablet 650 mg  650 mg Oral Q6H PRN Tharon Aquas, NP       alum & mag hydroxide-simeth (MAALOX/MYLANTA) 200-200-20 MG/5ML suspension 30 mL  30 mL Oral Q4H PRN Tharon Aquas, NP       atomoxetine (STRATTERA) capsule 25 mg  25 mg Oral BID WC Tharon Aquas, NP   25 mg at 01/01/23 [id number]   clonazePAM (KLONOPIN) tablet 0.5 mg  0.5 mg Oral TID Ranae Palms, MD   0.5 mg at 01/01/23 1219   escitalopram (LEXAPRO) tablet 20 mg  20 mg Oral Daily Tharon Aquas, NP   20 mg at 01/01/23 [id number]   gabapentin (NEURONTIN) capsule 300 mg  300 mg Oral TID Ranae Palms, MD   300 mg at 01/01/23 1219   magnesium hydroxide (MILK OF MAGNESIA) suspension 30 mL  30 mL Oral Daily PRN Tharon Aquas, NP       multivitamin with minerals tablet 1 tablet  1 tablet Oral Daily Tharon Aquas, NP   1 tablet at 01/01/23 0812   OLANZapine zydis (ZYPREXA) disintegrating tablet 5 mg  5 mg Oral Q8H PRN Tharon Aquas, NP       And   ziprasidone (GEODON) injection 20 mg  20 mg Intramuscular PRN Tharon Aquas, NP       rOPINIRole (REQUIP) tablet 0.25 mg  0.25 mg Oral QHS Tharon Aquas, NP   0.25 mg at 12/31/22 2145   traZODone (DESYREL) tablet 50 mg  50 mg Oral QHS PRN Tharon Aquas, NP   50 mg at 12/31/22 2145    Lab Results: No results found for this or any previous visit (from the past 48 hour(s)).  Blood  Alcohol level:  Lab Results  Component Value Date   ETH 14 (H) 12/26/2022   ETH <11 [id number]    Metabolic Disorder Labs: Lab Results  Component Value Date   HGBA1C 4.6 (L) 12/09/2022   MPG 85.32 12/09/2022   MPG 97 06/23/2020   No results found for: "PROLACTIN" Lab Results  Component Value Date   CHOL 191 12/09/2022   TRIG 42 12/09/2022   HDL 60 12/09/2022   CHOLHDL 3.2 12/09/2022   VLDL 8 12/09/2022   LDLCALC 123 (H) 12/09/2022   LDLCALC 109 (H) 01/31/2022    Physical Findings: AIMS: Facial and Oral Movements Muscles of Facial Expression: None, normal Lips and Perioral Area: None, normal Jaw: None, normal Tongue: None, normal,Extremity Movements Upper (arms, wrists, hands, fingers): None, normal Lower (legs, knees, ankles, toes): None, normal, Trunk Movements Neck, shoulders, hips: None, normal, Overall Severity Severity of abnormal movements (highest score from questions above): None, normal Incapacitation due to abnormal movements: None, normal Patient's awareness of abnormal movements (rate only patient's report): No Awareness, Dental Status Current problems with teeth and/or dentures?: No Does patient usually wear dentures?: No  CIWA:    COWS:     Musculoskeletal: Strength & Muscle Tone: within normal limits Gait & Station: normal Patient leans: N/A  Psychiatric Specialty Exam:  Presentation  General Appearance:  Appropriate for Environment  Eye Contact: Fair  Speech: Clear and Coherent  Speech Volume: Decreased  Handedness: Right   Mood and Affect  Mood: Depressed  Affect: Constricted   Thought Process  Thought Processes: Coherent  Descriptions of Associations:Intact  Orientation:Full (Time, Place and Person)  Thought Content:Logical  History of Schizophrenia/Schizoaffective disorder:No  Duration of Psychotic Symptoms:No data recorded Hallucinations:Hallucinations: None  Ideas of Reference:None  Suicidal  Thoughts:Suicidal Thoughts: No  Homicidal Thoughts:Homicidal Thoughts: No   Sensorium  Memory: Immediate Fair; Remote Fair; Recent Fair  Judgment: Fair  Insight: Fair   Community education officer  Concentration: Fair  Attention Span: Fair  Recall: [employer] of Knowledge: Fair  Language: Fair   Psychomotor Activity  Psychomotor Activity: Psychomotor Activity: Normal   Assets  Assets: Communication Skills; Desire for Improvement; Housing; Resilience; Social Support  Sleep: Sleep: Good    Physical Exam: Physical Exam Constitutional:      Appearance: Normal appearance.  Neurological:     Mental Status: He is alert.    Review of Systems  Psychiatric/Behavioral:  Positive for depression.   All other systems reviewed and are negative.  Blood pressure 100/71, pulse (!) 102, temperature 97.8 F (36.6 C), temperature source Oral, resp. rate 20, height '6\' 2"'$  (1.88 m), weight 84.4 kg, SpO2 98 %. Body mass index is 23.88 kg/m.   Treatment Plan Summary: Daily contact with patient to assess and evaluate symptoms and progress in treatment and Medication management  PLAN: Safety and Monitoring:             --  Voluntary admission to inpatient psychiatric unit for safety, stabilization and treatment             -- Daily contact with patient to assess and evaluate symptoms and progress in treatment             -- Patient's case to be discussed in multi-disciplinary team meeting             -- Observation Level : q15 minute checks             -- Vital signs:  q12 hours             -- Precautions: suicide, elopement, and assault   2. Psychiatric Diagnoses and Treatment:               --  The risks/benefits/side-effects/alternatives to this medication were discussed in detail with the patient and time was given for questions. The patient consents to medication trial.  -- FDA Change in medications today. Strattera 50 mg twice a day with meals Clonazepam 0.5 mg  p.o. 3 times a day instead of every 8 hours. Escitalopram 20 mg a day Gabapentin increased to 300 mg 3 times a day Requip 0.25 mg at bedtime             -- Metabolic profile and EKG monitoring obtained while on an atypical antipsychotic (BMI: Lipid Panel: HbgA1c: QTc:) as indicated.             -- Encouraged patient to participate in unit milieu and in scheduled group therapies              -- Short Term Goals: Ability to identify changes in lifestyle to reduce recurrence of condition will improve, Ability to verbalize feelings will improve, Ability to disclose and discuss suicidal ideas, Ability to demonstrate self-control will improve, Ability to identify and develop effective coping behaviors will improve, and Compliance with prescribed medications will improve             -- Long Term Goals: Improvement in symptoms so as ready for discharge                3. Medical Issues Being Addressed:  Tobacco Use Disorder             -- Nicotine patch '21mg'$ /24 hours ordered             -- Smoking cessation encouraged   4. Discharge Planning:              -- Social work and case management to assist with discharge planning and identification of hospital follow-up needs prior to discharge             -- Estimated LOS: Possible as by Thursday, 01/03/2023.             -- Discharge Concerns: Need to establish a safety plan; Medication compliance and effectiveness             -- Discharge Goals: Return home with outpatient referrals for mental health follow-up including medication management/psychotherapy     I certify that inpatient services furnished can reasonably be expected to improve the patient's condition.    Ranae Palms, MD 01/01/2023, 1:43 PMPatient ID: Jesse Jones, male   DOB: [birth year], 42 y.o.   MRN: [id number] Total Time Spent in Direct Patient Care:  I personally spent 30 minutes on the unit in direct patient care. The direct patient care time included face-to-face time  with the patient, reviewing the patient's chart, communicating with other professionals, and coordinating care. Greater than 50% of this time was spent in counseling or coordinating care with the patient regarding goals of hospitalization, psycho-education, and discharge planning needs.   [facility] Psychiatrist

## 2023-01-01 NOTE — Group Note (Unsigned)
Date:  01/01/2023 Time:  8:50 PM  Group Topic/Focus:  Wrap-Up Group:   The focus of this group is to help patients review their daily goal of treatment and discuss progress on daily workbooks.     Participation Level:  {BHH PARTICIPATION HD:[id number]  Participation Quality:  {BHH PARTICIPATION QUALITY:22265}  Affect:  {BHH AFFECT:22266}  Cognitive:  {BHH COGNITIVE:22267}  Insight: {BHH Insight2:20797}  Engagement in Group:  {BHH ENGAGEMENT IN JY:[id number]  Modes of Intervention:  {BHH MODES OF INTERVENTION:22269}  Additional Comments:  ***  Debe Coder 01/01/2023, 8:50 PM

## 2023-01-01 NOTE — Progress Notes (Signed)
Adult Psychoeducational Group Note  Date:  01/01/2023 Time:  9:26 PM  Group Topic/Focus:  Wrap-Up Group:   The focus of this group is to help patients review their daily goal of treatment and discuss progress on daily workbooks.  Participation Level:  Active  Participation Quality:  Appropriate  Affect:  Appropriate  Cognitive:  Appropriate  Insight: Appropriate  Engagement in Group:  Engaged  Modes of Intervention:  Education and Exploration  Additional Comments:  Patient attended and participated in group tonight. He reports that he had a happy day today.He had some sleep and see friends at dinner  Salley Scarlet [facility] 01/01/2023, 9:26 PM

## 2023-01-02 NOTE — Group Note (Signed)
Recreation Therapy Group Note   Group Topic:Leisure Education  Group Date: 01/02/2023 Start Time: 1400 End Time: [id number] Facilitators: Lylla Eifler-McCall, LRT,CTRS Location: [address] Dayroom   Activity Description/Intervention: Therapeutic Drumming. Patients with peers and staff were given the opportunity to engage in a leader facilitated [place] with staff from the [employer], in partnership with [employer]. Nurse, adult and trained [employer], Devin Going leading with LRT observing and documenting intervention and pt response. This evidenced-based practice targets 7 areas of health and wellbeing in the human experience including: stress-reduction, exercise, self-expression, camaraderie/support, nurturing, spirituality, and music-making (leisure).   Goal Area(s) Addresses:  Patient will engage in pro-social way in music group.  Patient will follow directions of drum leader on the first prompt. Patient will demonstrate no behavioral issues during group.  Patient will identify if a reduction in stress level occurs as a result of participation in therapeutic drum circle.     Affect/Mood: N/A   Participation Level: Did not attend    Clinical Observations/Individualized Feedback:     Plan: Continue to engage patient in RT group sessions 2-3x/week.   Hansen Carino-McCall, LRT,CTRS  01/02/2023 3:37 PM

## 2023-01-02 NOTE — Progress Notes (Signed)
[facility] MD Progress Note  01/02/2023 10:33 AM Jesse Jones  MRN:  [id number]   Reason for admission: The patient is a 42 year old Caucasian male with a long history of depression with suicidal ideations, history of ADHD/autism spectrum disorder and history of TBI who has been under regular care of a psychiatrist for many years.  He was admitted with the symptoms of depression and self-reported suicidal ideations with intent to overdose.  He was unable to contract for safety.  History of present illness: The patient was recently discharged from this facility on 12/20/2022.  After a 12-day hospitalization.  He was referred to partial hospital program and apparently his first appointment was yesterday which he missed.  Medication adjustment was made during the last hospitalization and he was left on low-dose Klonopin 0.5 3 times daily and was started on Strattera and Lexapro.  He is followed up by a local psychiatrist and therapist.  Daily notes: Jesse Jones is seen by this provider with the attending psychiatrist, chart reviewed. The chart findings discussed with the treatment team. He was lying down in bed, but easily aroused & verbally responsive. He is making a good eye contact with an improved affect. He is verbally responsive. He reports, "I'm doing well. I feel good. I have no complaints. I think I feel ready to go home. I came here because I was feeling suicidal, but I'm better now. I'm not having any suicidal thoughts any more. I'm sleeping well. My appetite is good. I have not been attending group sessions because I don't want. But I did try to go last evening. Admission reports also indicated that patient has been isolating himself at home. He was apparently staying in bed and not doing much. He has also shown the same pattern her at [facility] as reported by staff. The attending & this provider did tell/explain to patient that part of his treatment plan is for him to take his medications, attend/participate in the  group sessions to learn coping skill to help him cope better after discharge. And this is what he not been able to accomplish since being here, as a result he is not going to be discharged tomorrow as requested. He is instructed & encouraged to start now to attend/participate in the group sessions while complying to taking his medications. He will later be re-evaluated for discharge may be by Friday. Patient is in agreement. He currently denies any SIHI, AVH, delusional thoughts or paranoia. He does not appear to be responding to any internal stimuli. There are no changes made on his current plan of care. Continue as already in progress.   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   Depression    ED (erectile dysfunction) 05/12/2012   Hyperlipidemia, mild 04/18/2015   Insomnia    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   Outbursts of anger    Preventative health care 10/03/2011   Tobacco abuse 04/28/2011    Past Surgical History:  Procedure Laterality Date   NASAL SEPTUM SURGERY Bilateral    Dr  Redmond Baseman 2017   punctured lung     toe surgeries  during childhood    for ingrown toenails   Family History:  Family History  Problem Relation Age of Onset   Depression Mother    Anxiety disorder Mother    Depression Brother    Diabetes Brother        type 1   Cancer Paternal Grandmother        lung/ didn't smoke   Cancer Maternal Uncle 88       colon cancer   Family Psychiatric  History: Please see H&P Social History:  Social  History   Substance and Sexual Activity  Alcohol Use Yes   Alcohol/week: 36.0 standard drinks of alcohol   Types: 36 Cans of beer per week   Comment: sober x74yr No alcohol.     Social History   Substance and Sexual Activity  Drug Use Yes   Types: Marijuana, Other-see comments   Comment: pt stopped 4 year ago    Social History   Socioeconomic History   Marital status: Single    Spouse name: Not on file   Number of children: 0   Years of education: 12   Highest education level: 12th grade  Occupational History   Not on file  Tobacco Use   Smoking status: Former    Packs/day: 0.30    Years: 20.00    Total pack years: 6.00    Types: Cigarettes    Passive exposure: Current   Smokeless tobacco: Never   Tobacco comments:    Discussed Smoking Cessation and Alcohol and Drug Services  Vaping Use   Vaping Use: Never used  Substance and Sexual Activity   Alcohol use: Yes    Alcohol/week: 36.0 standard drinks of alcohol    Types: 36 Cans of beer per week    Comment: sober x489yrNo alcohol.   Drug use: Yes    Types: Marijuana, Other-see comments    Comment: pt stopped 4 year ago   Sexual activity: Yes    Partners: Female    Comment: lives alone and eating well. exercising  Other Topics Concern   Not on file  Social History Narrative   Not on file   Social Determinants of Health   Financial Resource Strain: Low Risk  (03/02/2022)   Overall Financial Resource Strain (CARDIA)    Difficulty of Paying Living Expenses: Not hard at all  Food Insecurity: No Food Insecurity (12/28/2022)   Hunger Vital Sign    Worried About Running Out of Food in the Last Year: Never true    Ran Out of Food in the Last Year: Never true  Transportation Needs: No Transportation Needs (12/28/2022)   PRAPARE - TrHydrologistMedical): No    Lack of Transportation (Non-Medical): No  Physical Activity: Inactive (03/02/2022)   Exercise Vital Sign    Days of Exercise per Week:  0 days    Minutes of Exercise per Session: 0 min  Stress: Stress Concern Present (03/02/2022)   [place]  Feeling of Stress : Very much  Social Connections: Socially Isolated (03/02/2022)   Social Connection and Isolation Panel [NHANES]    Frequency of Communication with Friends and Family: More than three times a week    Frequency of Social Gatherings with Friends and Family: More than three times a week    Attends Religious Services: Never    Active Member of  Clubs or Organizations: No    Attends Archivist Meetings: Never    Marital Status: Never married   Additional Social History:    Sleep: Good  Appetite:  Good  Current Medications: Current Facility-Administered Medications  Medication Dose Route Frequency Provider Last Rate Last Admin   acetaminophen (TYLENOL) tablet 650 mg  650 mg Oral Q6H PRN Tharon Aquas, NP       alum & mag hydroxide-simeth (MAALOX/MYLANTA) 200-200-20 MG/5ML suspension 30 mL  30 mL Oral Q4H PRN Tharon Aquas, NP       atomoxetine (STRATTERA) capsule 25 mg  25 mg Oral BID WC Tharon Aquas, NP   25 mg at 01/02/23 0913   clonazePAM (KLONOPIN) tablet 0.5 mg  0.5 mg Oral TID Ranae Palms, MD   0.5 mg at 01/02/23 0913   escitalopram (LEXAPRO) tablet 20 mg  20 mg Oral Daily Tharon Aquas, NP   20 mg at 01/02/23 0913   gabapentin (NEURONTIN) capsule 300 mg  300 mg Oral TID Ranae Palms, MD   300 mg at 01/02/23 0913   magnesium hydroxide (MILK OF MAGNESIA) suspension 30 mL  30 mL Oral Daily PRN Tharon Aquas, NP       multivitamin with minerals tablet 1 tablet  1 tablet Oral Daily Tharon Aquas, NP   1 tablet at 01/02/23 0913   OLANZapine zydis (ZYPREXA) disintegrating tablet 5 mg  5 mg Oral Q8H PRN Tharon Aquas, NP       And   ziprasidone (GEODON) injection 20 mg  20 mg Intramuscular PRN Tharon Aquas, NP       rOPINIRole (REQUIP)  tablet 0.25 mg  0.25 mg Oral QHS Tharon Aquas, NP   0.25 mg at 01/01/23 2121   traZODone (DESYREL) tablet 50 mg  50 mg Oral QHS PRN Tharon Aquas, NP   50 mg at 01/01/23 2121    Lab Results: No results found for this or any previous visit (from the past 29 hour(s)).  Blood Alcohol level:  Lab Results  Component Value Date   ETH 14 (H) 12/26/2022   ETH <11 [id number]    Metabolic Disorder Labs: Lab Results  Component Value Date   HGBA1C 4.6 (L) 12/09/2022   MPG 85.32 12/09/2022   MPG 97 06/23/2020   No results found for: "PROLACTIN" Lab Results  Component Value Date   CHOL 191 12/09/2022   TRIG 42 12/09/2022   HDL 60 12/09/2022   CHOLHDL 3.2 12/09/2022   VLDL 8 12/09/2022   LDLCALC 123 (H) 12/09/2022   LDLCALC 109 (H) 01/31/2022    Physical Findings: AIMS: Facial and Oral Movements Muscles of Facial Expression: None, normal Lips and Perioral Area: None, normal Jaw: None, normal Tongue: None, normal,Extremity Movements Upper (arms, wrists, hands, fingers): None, normal Lower (legs, knees, ankles, toes): None, normal, Trunk Movements Neck, shoulders, hips: None, normal, Overall Severity Severity of abnormal movements (highest score from questions above): None, normal Incapacitation due to abnormal movements: None, normal Patient's awareness of abnormal movements (rate only patient's report): No Awareness, Dental Status Current problems with teeth and/or dentures?: No Does patient usually wear dentures?: No  CIWA:    COWS:     Musculoskeletal: Strength & Muscle Tone: within normal limits Gait & Station: normal Patient leans: N/A  Psychiatric Specialty Exam:  Presentation  General Appearance:  Appropriate for Environment; Casual; Fairly Groomed  Eye Contact: Good  Speech: Clear and Coherent; Normal Rate  Speech Volume:  Normal  Handedness: Right   Mood and Affect  Mood: Anxious; Depressed  Affect: Congruent   Thought Process   Thought Processes: Coherent  Descriptions of Associations:Intact  Orientation:Full (Time, Place and Person)  Thought Content:Logical  History of Schizophrenia/Schizoaffective disorder:No  Duration of Psychotic Symptoms:No data recorded Hallucinations:Hallucinations: None Description of Auditory Hallucinations: -- (Denies) Description of Visual Hallucinations: -- (Denies)  Ideas of Reference:None  Suicidal Thoughts:Suicidal Thoughts: No SI Active Intent and/or Plan: -- (Denies) SI Passive Intent and/or Plan: -- (Denies)  Homicidal Thoughts:Homicidal Thoughts: No   Sensorium  Memory: Immediate Good; Recent Good  Judgment: Fair  Insight: Fair   Community education officer  Concentration: Good  Attention Span: Good  Recall: Good  Fund of Knowledge: Fair  Language: Good   Psychomotor Activity  Psychomotor Activity: Psychomotor Activity: Normal   Assets  Assets: Communication Skills; Desire for Improvement; Housing; Physical Health; Social Support  Sleep: Sleep: Good Number of Hours of Sleep: 7  Physical Exam: Physical Exam Constitutional:      Appearance: Normal appearance.  HENT:     Nose: Nose normal.     Mouth/Throat:     Pharynx: Oropharynx is clear.  Cardiovascular:     Rate and Rhythm: Normal rate.     Pulses: Normal pulses.  Pulmonary:     Effort: Pulmonary effort is normal.  Genitourinary:    Comments: Deferred Musculoskeletal:        General: Normal range of motion.     Cervical back: Normal range of motion.  Skin:    General: Skin is warm and dry.  Neurological:     General: No focal deficit present.     Mental Status: He is alert and oriented to person, place, and time.    Review of Systems  Constitutional: Negative.  Negative for chills.  HENT:  Negative for congestion and sore throat.   Eyes:  Negative for blurred vision.  Respiratory:  Negative for cough, shortness of breath and wheezing.   Cardiovascular:  Negative  for chest pain and palpitations.  Gastrointestinal:  Negative for abdominal pain, constipation, diarrhea, heartburn, nausea and vomiting.  Genitourinary:  Negative for dysuria.  Musculoskeletal:  Negative for joint pain and myalgias.  Neurological:  Negative for dizziness, tingling, tremors, sensory change, speech change, focal weakness, seizures, loss of consciousness, weakness and headaches.  Psychiatric/Behavioral:  Positive for depression ("Improving".). Negative for hallucinations, memory loss, substance abuse and suicidal ideas. The patient is not nervous/anxious and does not have insomnia.   All other systems reviewed and are negative.  Blood pressure 103/82, pulse 86, temperature 97.6 F (36.4 C), temperature source Oral, resp. rate 16, height '6\' 2"'$  (1.88 m), weight 84.4 kg, SpO2 96 %. Body mass index is 23.88 kg/m.   Treatment Plan Summary: Daily contact with patient to assess and evaluate symptoms and progress in treatment and Medication management  PLAN: Safety and Monitoring:             --  Voluntary admission to inpatient psychiatric unit for safety, stabilization and treatment             -- Daily contact with patient to assess and evaluate symptoms and progress in treatment             -- Patient's case to be discussed in multi-disciplinary team meeting             -- Observation Level : q15 minute checks             -- Vital  signs:  q12 hours             -- Precautions: suicide, elopement, and assault   2. Psychiatric Diagnoses and Treatment:               --  The risks/benefits/side-effects/alternatives to this medication were discussed in detail with the patient and time was given for questions. The patient consents to medication trial.  -- FDA Continue. Strattera 50 mg twice a day with meals for ADHD. Clonazepam 0.5 mg p.o. 3 times a day instead of every 8 hours. Escitalopram 20 mg a day for depression Gabapentin increased 300 mg 3 times a day anxiety Requip 0.25  mg at bedtime for restless leg.             -- Metabolic profile and EKG monitoring obtained while on an atypical antipsychotic (BMI: Lipid Panel: HbgA1c: QTc:) as indicated.             -- Encouraged patient to participate in unit milieu and in scheduled group therapies              -- Short Term Goals: Ability to identify changes in lifestyle to reduce recurrence of condition will improve, Ability to verbalize feelings will improve, Ability to disclose and discuss suicidal ideas, Ability to demonstrate self-control will improve, Ability to identify and develop effective coping behaviors will improve, and Compliance with prescribed medications will improve             -- Long Term Goals: Improvement in symptoms so as ready for discharge                3. Medical Issues Being Addressed:              Tobacco Use Disorder             -- Nicotine patch '21mg'$ /24 hours ordered             -- Smoking cessation encouraged   4. Discharge Planning:              -- Social work and case management to assist with discharge planning and identification of hospital follow-up needs prior to discharge             -- Estimated LOS: Possible as by Thursday, 01/03/2023.             -- Discharge Concerns: Need to establish a safety plan; Medication compliance and effectiveness             -- Discharge Goals: Return home with outpatient referrals for mental health follow-up including medication management/psychotherapy     I certify that inpatient services furnished can reasonably be expected to improve the patient's condition.    Lindell Spar, NP 01/02/2023, 10:33 AMPatient ID: Jesse Jones, male   DOB: 08-Nov-1980, 42 y.o.   MRN: [id number] Patient ID: Jesse Jones, male   DOB: 1981-02-10, 42 y.o.   MRN: [id number]

## 2023-01-02 NOTE — Progress Notes (Signed)
D: Patient alert and oriented. Affect/mood reported as improving. Denies SI, HI, AVH, and pain. Patient goal, "to go to more groups."  A: Scheduled medication administered to patient, per MD orders. Support and encouragement provided. Routine safety checks conducted every 15 minutes. Patient informed to notify staff with problems or concerns.   R: No adverse drug reactions noted. Patient contracts for safety at this time. Patient compliant with medications and treatment plan. Patient receptive, calm and cooperative. Patient interacts well with others on unit. Patient remains safe at this time.

## 2023-01-02 NOTE — Group Note (Unsigned)
Date:  01/02/2023 Time:  4:34 PM  Group Topic/Focus:  Healthy Communication:   The focus of this group is to discuss communication, barriers to communication, as well as healthy ways to communicate with others. Making Healthy Choices:   The focus of this group is to help patients identify negative/unhealthy choices they were using prior to admission and identify positive/healthier coping strategies to replace them upon discharge.     Participation Level:  {BHH PARTICIPATION [id number]  Participation Quality:  {BHH PARTICIPATION QUALITY:22265}  Affect:  {BHH AFFECT:22266}  Cognitive:  {BHH COGNITIVE:22267}  Insight: {BHH Insight2:20797}  Engagement in Group:  {BHH ENGAGEMENT IN BP:[id number]  Modes of Intervention:  {BHH MODES OF INTERVENTION:22269}  Additional Comments:  ***  Garvin Fila 01/02/2023, 4:34 PM

## 2023-01-02 NOTE — Group Note (Unsigned)
Date:  01/02/2023 Time:  9:45 AM  Group Topic/Focus:  Goals Group:   The focus of this group is to help patients establish daily goals to achieve during treatment and discuss how the patient can incorporate goal setting into their daily lives to aide in recovery. Orientation:   The focus of this group is to educate the patient on the purpose and policies of crisis stabilization and provide a format to answer questions about their admission.  The group details unit policies and expectations of patients while admitted.     Participation Level:  {BHH PARTICIPATION WO:[id number]  Participation Quality:  {BHH PARTICIPATION QUALITY:22265}  Affect:  {BHH AFFECT:22266}  Cognitive:  {BHH COGNITIVE:22267}  Insight: {BHH Insight2:20797}  Engagement in Group:  {BHH ENGAGEMENT IN BP:[id number]  Modes of Intervention:  {BHH MODES OF INTERVENTION:22269}  Additional Comments:  ***  Garvin Fila 01/02/2023, 9:45 AM

## 2023-01-02 NOTE — Group Note (Signed)
Date:  01/02/2023 Time:  9:53 AM  Group Topic/Focus:  Goals Group:   The focus of this group is to help patients establish daily goals to achieve during treatment and discuss how the patient can incorporate goal setting into their daily lives to aide in recovery. Orientation:   The focus of this group is to educate the patient on the purpose and policies of crisis stabilization and provide a format to answer questions about their admission.  The group details unit policies and expectations of patients while admitted.    Participation Level:  Did Not Attend  Participation Quality:    Affect:    Cognitive:    Insight:   Engagement in Group:    Modes of Intervention:    Additional Comments:    Garvin Fila 01/02/2023, 9:53 AM

## 2023-01-02 NOTE — Group Note (Unsigned)
Date:  01/02/2023 Time:  4:35 PM  Group Topic/Focus:  Healthy Communication:   The focus of this group is to discuss communication, barriers to communication, as well as healthy ways to communicate with others. Making Healthy Choices:   The focus of this group is to help patients identify negative/unhealthy choices they were using prior to admission and identify positive/healthier coping strategies to replace them upon discharge.     Participation Level:  {BHH PARTICIPATION WO:[id number]  Participation Quality:  {BHH PARTICIPATION QUALITY:22265}  Affect:  {BHH AFFECT:22266}  Cognitive:  {BHH COGNITIVE:22267}  Insight: {BHH Insight2:20797}  Engagement in Group:  {BHH ENGAGEMENT IN BP:[id number]  Modes of Intervention:  {BHH MODES OF INTERVENTION:22269}  Additional Comments:  ***  Garvin Fila 01/02/2023, 4:35 PM

## 2023-01-02 NOTE — Group Note (Signed)
Date:  01/02/2023 Time:  4:38 PM  Group Topic/Focus:  Healthy Communication:   The focus of this group is to discuss communication, barriers to communication, as well as healthy ways to communicate with others. Making Healthy Choices:   The focus of this group is to help patients identify negative/unhealthy choices they were using prior to admission and identify positive/healthier coping strategies to replace them upon discharge.    Participation Level:  Active  Participation Quality:  Appropriate  Affect:  Appropriate  Cognitive:  Appropriate  Insight: Appropriate  Engagement in Group:  Engaged  Modes of Intervention:  Discussion  Additional Comments:    Garvin Fila 01/02/2023, 4:38 PM

## 2023-01-02 NOTE — Group Note (Unsigned)
Date:  01/02/2023 Time:  9:53 AM  Group Topic/Focus:  Goals Group:   The focus of this group is to help patients establish daily goals to achieve during treatment and discuss how the patient can incorporate goal setting into their daily lives to aide in recovery. Orientation:   The focus of this group is to educate the patient on the purpose and policies of crisis stabilization and provide a format to answer questions about their admission.  The group details unit policies and expectations of patients while admitted.     Participation Level:  {BHH PARTICIPATION WO:[id number]  Participation Quality:  {BHH PARTICIPATION QUALITY:22265}  Affect:  {BHH AFFECT:22266}  Cognitive:  {BHH COGNITIVE:22267}  Insight: {BHH Insight2:20797}  Engagement in Group:  {BHH ENGAGEMENT IN BP:[id number]  Modes of Intervention:  {BHH MODES OF INTERVENTION:22269}  Additional Comments:  ***  Garvin Fila 01/02/2023, 9:53 AM

## 2023-01-02 NOTE — Progress Notes (Signed)
   01/02/23 0604  15 Minute Checks  Location Bedroom  Visual Appearance Calm  Behavior Sleeping  Sleep (Behavioral Health Patients Only)  Calculate sleep? (Click Yes once per 24 hr at 0600 safety check) Yes  Documented sleep last 24 hours 11.75

## 2023-01-03 NOTE — Group Note (Signed)
LCSW Group Therapy Note   Group Date: 01/03/2023 Start Time: 1100 End Time: 1200  Type of Therapy and Topic:  Group Therapy:  Self-Care after Hospitalization  Participation Level:  Active   Description of Group This process group involved patients discussing how they plan to take care of themselves in a better manner when they get home from the hospital.  The group started with patients listing one healthy self-care they hope to engage in at discharge that they did not use prior to admission.  We discussed a variety of other means of self-care which had a large range from hygiene activities to eating to setting boundaries to participating in peer support groups.  The primary focus by CSW was to point out commonalities.  When there were participants who stated everyone else can get help, but they themselves are "beyond help," this was discussed in detail and this belief was gently challenged.  Finally, it was announced that immediately following group was to be "Hygiene Hour" where everyone would go to their rooms and take care of their personal hygiene.  This was met with wide acceptance.  Therapeutic Goals Patient will identify and describe one self-care activity to deliberately plan to use upon hospital discharge. Patient will participate in generating additional ideas about healthy self-care options when they return to the community. Patients will be supportive of one another and receive support from others. Patients will be challenged to realize that they are not "beyond help" any more than other participants in the room are.  Summary of Patient Progress:  The Pt attended group and remained there the entire time.  The Pt accepted all worksheets and materials provided.  The Pt demonstrated understanding of the topic being discussed by asking questions and sharing their thoughts and feelings with their peers.  The Pt was appropriate with peers and staff throughout the group session.     Therapeutic Modalities Brief Solution-Focused Therapy Psychoeducation  Darleen Crocker, [place] 01/03/2023  1:11 PM

## 2023-01-03 NOTE — Progress Notes (Signed)
   01/03/23 0600  15 Minute Checks  Location Bedroom  Visual Appearance Calm  Behavior Sleeping  Sleep (Behavioral Health Patients Only)  Calculate sleep? (Click Yes once per 24 hr at 0600 safety check) Yes  Documented sleep last 24 hours 10.5

## 2023-01-03 NOTE — Progress Notes (Signed)
[facility] MD Progress Note  01/03/2023 9:21 AM Jesse Jones  MRN:  [id number]   Reason for admission: The patient is a 42 year old Caucasian male with a long history of depression with suicidal ideations, history of ADHD/autism spectrum disorder and history of TBI who has been under regular care of a psychiatrist for many years.  He was admitted with the symptoms of depression and self-reported suicidal ideations with intent to overdose.  He was unable to contract for safety.  History of present illness: The patient was recently discharged from this facility on 12/20/2022.  After a 12-day hospitalization.  He was referred to partial hospital program and apparently his first appointment was yesterday which he missed.  Medication adjustment was made during the last hospitalization and he was left on low-dose Klonopin 0.5 3 times daily and was started on Strattera and Lexapro.  He is followed up by a local psychiatrist and therapist.  Daily notes: Jesse Jones is seen, chart reviewed. The chart findings discussed with the treatment team. He was lying down in bed & verbally responsive. He is making a good eye contact with an improved affect. He reports, "I'm doing well. My mood is okay. I just need to tell you that I went to the group session this morning. I learned some coping skills to help me manage my depression after discharge, such as distractions to keep my mind off depression. I can take a walk or play my video game to create distraction from my depression. I slept well last night. Patient denies any SIHI, AVH, delusional thoughts or paranoia. He is taking & tolerating his treatment regimen. Denies any side effects. Will continue current plan of care as already in progress.  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   Depression    ED (erectile dysfunction) 05/12/2012   Hyperlipidemia, mild 04/18/2015   Insomnia    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   Outbursts of anger    Preventative health care 10/03/2011   Tobacco abuse 04/28/2011    Past Surgical History:  Procedure Laterality Date   NASAL SEPTUM SURGERY Bilateral    Dr Redmond Baseman 2017   punctured lung     toe surgeries  during childhood    for ingrown toenails   Family History:  Family History  Problem Relation Age of Onset   Depression Mother    Anxiety disorder Mother    Depression Brother    Diabetes Brother        type 1   Cancer Paternal Grandmother        lung/ didn't smoke   Cancer Maternal Uncle 60       colon cancer   Family Psychiatric  History: Please see H&P Social History:  Social History   Substance and Sexual Activity  Alcohol Use Yes   Alcohol/week: 36.0 standard drinks of alcohol   Types: 36 Cans of beer per week   Comment: sober x44yr No alcohol.     Social History   Substance and Sexual Activity  Drug  Use Yes   Types: Marijuana, Other-see comments   Comment: pt stopped 4 year ago    Social History   Socioeconomic History   Marital status: Single    Spouse name: Not on file   Number of children: 0   Years of education: 12   Highest education level: 12th grade  Occupational History   Not on file  Tobacco Use   Smoking status: Former    Packs/day: 0.30    Years: 20.00    Total pack years: 6.00    Types: Cigarettes    Passive exposure: Current   Smokeless tobacco: Never   Tobacco comments:    Discussed Smoking Cessation and Alcohol and Drug Services  Vaping Use   Vaping Use: Never used  Substance and Sexual Activity    Alcohol use: Yes    Alcohol/week: 36.0 standard drinks of alcohol    Types: 36 Cans of beer per week    Comment: sober x36yr No alcohol.   Drug use: Yes    Types: Marijuana, Other-see comments    Comment: pt stopped 4 year ago   Sexual activity: Yes    Partners: Female    Comment: lives alone and eating well. exercising  Other Topics Concern   Not on file  Social History Narrative   Not on file   Social Determinants of Health   Financial Resource Strain: Low Risk  (03/02/2022)   Overall Financial Resource Strain (CARDIA)    Difficulty of Paying Living Expenses: Not hard at all  Food Insecurity: No Food Insecurity (12/28/2022)   Hunger Vital Sign    Worried About Running Out of Food in the Last Year: Never true    Ran Out of Food in the Last Year: Never true  Transportation Needs: No Transportation Needs (12/28/2022)   PRAPARE - THydrologist(Medical): No    Lack of Transportation (Non-Medical): No  Physical Activity: Inactive (03/02/2022)   Exercise Vital Sign    Days of Exercise per Week: 0 days    Minutes of Exercise per Session: 0 min  Stress: Stress Concern Present (03/02/2022)   [place]   Feeling of Stress : Very much  Social Connections: Socially Isolated (03/02/2022)   Social Connection and Isolation Panel [NHANES]    Frequency of Communication with Friends and Family: More than three times a week    Frequency of Social Gatherings with Friends and Family: More than three times a week    Attends Religious Services: Never    AMarine scientistor Organizations: No    Attends CArchivistMeetings: Never    Marital Status: Never married   Additional Social History:    Sleep: Good  Appetite:  Good  Current Medications: Current Facility-Administered Medications  Medication Dose Route Frequency Provider Last Rate Last Admin   acetaminophen (TYLENOL) tablet 650 mg   650 mg Oral Q6H PRN LTharon Aquas NP       alum & mag hydroxide-simeth (MAALOX/MYLANTA) 200-200-20 MG/5ML suspension 30 mL  30 mL Oral Q4H PRN LTharon Aquas NP       atomoxetine (STRATTERA) capsule 25 mg  25 mg Oral BID WC LTharon Aquas NP   25 mg at 01/03/23 0825   clonazePAM (KLONOPIN) tablet 0.5 mg  0.5 mg Oral TID GRanae Palms MD   0.5 mg at 01/03/23 0825   escitalopram (LEXAPRO) tablet 20 mg  20 mg Oral  Daily Tharon Aquas, NP   20 mg at 01/03/23 0825   gabapentin (NEURONTIN) capsule 300 mg  300 mg Oral TID Ranae Palms, MD   300 mg at 01/03/23 0824   magnesium hydroxide (MILK OF MAGNESIA) suspension 30 mL  30 mL Oral Daily PRN Tharon Aquas, NP       multivitamin with minerals tablet 1 tablet  1 tablet Oral Daily Tharon Aquas, NP   1 tablet at 01/03/23 0824   OLANZapine zydis (ZYPREXA) disintegrating tablet 5 mg  5 mg Oral Q8H PRN Tharon Aquas, NP       And   ziprasidone (GEODON) injection 20 mg  20 mg Intramuscular PRN Tharon Aquas, NP       rOPINIRole (REQUIP) tablet 0.25 mg  0.25 mg Oral QHS Tharon Aquas, NP   0.25 mg at 01/02/23 2125   traZODone (DESYREL) tablet 50 mg  50 mg Oral QHS PRN Tharon Aquas, NP   50 mg at 01/02/23 2124    Lab Results: No results found for this or any previous visit (from the past 48 hour(s)).  Blood Alcohol level:  Lab Results  Component Value Date   ETH 14 (H) 12/26/2022   ETH <11 [id number]    Metabolic Disorder Labs: Lab Results  Component Value Date   HGBA1C 4.6 (L) 12/09/2022   MPG 85.32 12/09/2022   MPG 97 06/23/2020   No results found for: "PROLACTIN" Lab Results  Component Value Date   CHOL 191 12/09/2022   TRIG 42 12/09/2022   HDL 60 12/09/2022   CHOLHDL 3.2 12/09/2022   VLDL 8 12/09/2022   LDLCALC 123 (H) 12/09/2022   LDLCALC 109 (H) 01/31/2022    Physical Findings: AIMS: Facial and Oral Movements Muscles of Facial Expression: None, normal Lips and  Perioral Area: None, normal Jaw: None, normal Tongue: None, normal,Extremity Movements Upper (arms, wrists, hands, fingers): None, normal Lower (legs, knees, ankles, toes): None, normal, Trunk Movements Neck, shoulders, hips: None, normal, Overall Severity Severity of abnormal movements (highest score from questions above): None, normal Incapacitation due to abnormal movements: None, normal Patient's awareness of abnormal movements (rate only patient's report): No Awareness, Dental Status Current problems with teeth and/or dentures?: No Does patient usually wear dentures?: No  CIWA:    COWS:     Musculoskeletal: Strength & Muscle Tone: within normal limits Gait & Station: normal Patient leans: N/A  Psychiatric Specialty Exam:  Presentation  General Appearance:  Appropriate for Environment; Casual; Fairly Groomed  Eye Contact: Good  Speech: Clear and Coherent; Normal Rate  Speech Volume: Normal  Handedness: Right   Mood and Affect  Mood: Anxious; Depressed  Affect: Congruent   Thought Process  Thought Processes: Coherent  Descriptions of Associations:Intact  Orientation:Full (Time, Place and Person)  Thought Content:Logical  History of Schizophrenia/Schizoaffective disorder:No  Duration of Psychotic Symptoms:No data recorded Hallucinations:No data recorded  Ideas of Reference:None  Suicidal Thoughts:No data recorded  Homicidal Thoughts:No data recorded   Sensorium  Memory: Immediate Good; Recent Good  Judgment: Fair  Insight: Fair   Community education officer  Concentration: Good  Attention Span: Good  Recall: Good  Fund of Knowledge: Fair  Language: Good   Psychomotor Activity  Psychomotor Activity: No data recorded   Assets  Assets: Communication Skills; Desire for Improvement; Housing; Physical Health; Social Support  Sleep: No data recorded  Physical Exam: Physical Exam Constitutional:      Appearance: Normal  appearance.  HENT:  Nose: Nose normal.     Mouth/Throat:     Pharynx: Oropharynx is clear.  Cardiovascular:     Rate and Rhythm: Normal rate.     Pulses: Normal pulses.  Pulmonary:     Effort: Pulmonary effort is normal.  Genitourinary:    Comments: Deferred Musculoskeletal:        General: Normal range of motion.     Cervical back: Normal range of motion.  Skin:    General: Skin is warm and dry.  Neurological:     General: No focal deficit present.     Mental Status: He is alert and oriented to person, place, and time.    Review of Systems  Constitutional: Negative.  Negative for chills.  HENT:  Negative for congestion and sore throat.   Eyes:  Negative for blurred vision.  Respiratory:  Negative for cough, shortness of breath and wheezing.   Cardiovascular:  Negative for chest pain and palpitations.  Gastrointestinal:  Negative for abdominal pain, constipation, diarrhea, heartburn, nausea and vomiting.  Genitourinary:  Negative for dysuria.  Musculoskeletal:  Negative for joint pain and myalgias.  Neurological:  Negative for dizziness, tingling, tremors, sensory change, speech change, focal weakness, seizures, loss of consciousness, weakness and headaches.  Psychiatric/Behavioral:  Positive for depression ("Improving".). Negative for hallucinations, memory loss, substance abuse and suicidal ideas. The patient is not nervous/anxious and does not have insomnia.   All other systems reviewed and are negative.  Blood pressure 93/80, pulse (!) 107, temperature (!) 97.5 F (36.4 C), temperature source Oral, resp. rate 20, height '6\' 2"'$  (1.88 m), weight 84.4 kg, SpO2 97 %. Body mass index is 23.88 kg/m.   Treatment Plan Summary: Daily contact with patient to assess and evaluate symptoms and progress in treatment and Medication management  PLAN: Safety and Monitoring:             --  Voluntary admission to inpatient psychiatric unit for safety, stabilization and treatment              -- Daily contact with patient to assess and evaluate symptoms and progress in treatment             -- Patient's case to be discussed in multi-disciplinary team meeting             -- Observation Level : q15 minute checks             -- Vital signs:  q12 hours             -- Precautions: suicide, elopement, and assault   2. Psychiatric Diagnoses and Treatment:               --  The risks/benefits/side-effects/alternatives to this medication were discussed in detail with the patient and time was given for questions. The patient consents to medication trial.  -- FDA Continue. Strattera 50 mg twice a day with meals for ADHD. Clonazepam 0.5 mg p.o. 3 times a day instead of every 8 hours. Escitalopram 20 mg a day for depression Gabapentin increased 300 mg 3 times a day anxiety Requip 0.25 mg at bedtime for restless leg.             -- Metabolic profile and EKG monitoring obtained while on an atypical antipsychotic (BMI: Lipid Panel: HbgA1c: QTc:) as indicated.             -- Encouraged patient to participate in unit milieu and in scheduled group therapies              --  Short Term Goals: Ability to identify changes in lifestyle to reduce recurrence of condition will improve, Ability to verbalize feelings will improve, Ability to disclose and discuss suicidal ideas, Ability to demonstrate self-control will improve, Ability to identify and develop effective coping behaviors will improve, and Compliance with prescribed medications will improve             -- Long Term Goals: Improvement in symptoms so as ready for discharge                3. Medical Issues Being Addressed:              Tobacco Use Disorder             -- Nicotine patch '21mg'$ /24 hours ordered             -- Smoking cessation encouraged   4. Discharge Planning:              -- Social work and case management to assist with discharge planning and identification of hospital follow-up needs prior to discharge             --  Estimated LOS: Possible as by Thursday, 01/03/2023.             -- Discharge Concerns: Need to establish a safety plan; Medication compliance and effectiveness             -- Discharge Goals: Return home with outpatient referrals for mental health follow-up including medication management/psychotherapy     I certify that inpatient services furnished can reasonably be expected to improve the patient's condition.    Lindell Spar, NP, pmhnp, fnp-bc 01/03/2023, 9:21 AM Patient ID: Jesse Jones, male   DOB: 18-Jan-1981, 42 y.o.    MRN: [id number] Patient ID: Jesse Jones, male   DOB: 18-Aug-1981, 42 y.o.    MRN: [id number] Patient ID: Jesse Jones, male   DOB: 1981-03-10, 42 y.o.    MRN: [id number]

## 2023-01-03 NOTE — Progress Notes (Signed)
[facility] Group Notes:  (Nursing/MHT/Case Management/Adjunct)  Date:  01/02/2023  Time:  2000  Type of Therapy:   NA Meeting  Participation Level:    Participation Quality:  Appropriate and Attentive  Affect:  Depressed  Cognitive:  Alert  Insight:    Engagement in Group:  Engaged  Modes of Intervention:  Education and Support  Summary of Progress/Problems:  Shellia Cleverly 01/03/2023, 12:37 AM

## 2023-01-03 NOTE — BHH Group Notes (Signed)
Jesse Jones attended wrap up group . Talked about goals and things to do to keep our minds off the bad.

## 2023-01-03 NOTE — BHH Group Notes (Signed)
Adult Psychoeducational Group Note  Date:  01/03/2023 Time:  9:52 AM  Group Topic/Focus:  Goals Group:   The focus of this group is to help patients establish daily goals to achieve during treatment and discuss how the patient can incorporate goal setting into their daily lives to aide in recovery.  Participation Level:  Active  Participation Quality:  Appropriate  Affect:  Appropriate  Cognitive:  Appropriate  Insight: Appropriate  Engagement in Group:  Engaged  Modes of Intervention:  Education  Additional Comments:  PT Participated in class.  Camila Li 01/03/2023, 9:52 AM

## 2023-01-04 MED ORDER — ATOMOXETINE HCL 25 MG PO CAPS: 25.0000 mg | ORAL_CAPSULE | Freq: Two times a day (BID) | ORAL | 0 refills | Status: AC

## 2023-01-04 MED ORDER — CLONAZEPAM 0.5 MG PO TABS: 0.5000 mg | ORAL_TABLET | Freq: Three times a day (TID) | ORAL | 0 refills | Status: AC

## 2023-01-04 MED ORDER — GABAPENTIN 300 MG PO CAPS: 300.0000 mg | ORAL_CAPSULE | Freq: Three times a day (TID) | ORAL | 0 refills | Status: AC

## 2023-01-04 MED ORDER — TRAZODONE HCL 50 MG PO TABS: 50.0000 mg | ORAL_TABLET | Freq: Every evening | ORAL | 0 refills | Status: AC | PRN

## 2023-01-04 MED ORDER — ESCITALOPRAM OXALATE 20 MG PO TABS: 20.0000 mg | ORAL_TABLET | Freq: Every day | ORAL | 0 refills | Status: AC

## 2023-01-04 NOTE — Progress Notes (Signed)
  [facility] Adult Case Management Discharge Plan :  Will you be returning to the same living situation after discharge:  Yes,  Home  At discharge, do you have transportation home?: Yes,  Father  Do you have the ability to pay for your medications: Yes,  Medicare A&B  Release of information consent forms completed and in the chart;  Patient's signature needed at discharge.  Patient to Follow up at:  Follow-up [place]. Go on 01/09/2023.   Specialty: Behavioral Health Why: You have an appointment for medication management services on 01/09/23 at 1:20 pm. The appointment will be held in person. Contact information: [address] [place]  [address] [phone]                 Next level of care provider has access to [place] and Suicide Prevention discussed: Yes,  With patient and father      Has patient been referred to the Quitline?: N/A patient is not a smoker  Patient has been referred for addiction treatment: N/A, Pt sober for 4 years.   Darleen Crocker, LCSWA 01/04/2023, 10:04 AM

## 2023-01-04 NOTE — Progress Notes (Signed)
Patient educated about follow up care, upcoming appointments reviewed. Patient verbalizes understanding of all follow up appointments. AVS and suicide safety plan reviewed. Patient expresses no concerns or questions at this time. Educated on prescriptions and medication regimen. Patient belongings returned. Patient denies SI, HI, AVH at this time. Educated patient about suicide help resources and hotline, encouraged to call for assistance in the event of a crisis. Patient agrees. Patient is ambulatory and safe at time of discharge. Patient discharged to hospital lobby at this time.

## 2023-01-04 NOTE — Progress Notes (Signed)
   01/04/23 0600  15 Minute Checks  Location Bedroom  Visual Appearance Calm  Behavior Sleeping  Sleep (Behavioral Health Patients Only)  Calculate sleep? (Click Yes once per 24 hr at 0600 safety check) Yes  Documented sleep last 24 hours 7.75

## 2023-01-04 NOTE — BHH Suicide Risk Assessment (Signed)
Suicide Risk Assessment  Discharge Assessment    [facility] Discharge Suicide Risk Assessment   Principal Problem: MDD (major depressive disorder) Discharge Diagnoses: Principal Problem:   MDD (major depressive disorder)  Total Time spent with patient:  Greater than 30 minutes  Musculoskeletal: Strength & Muscle Tone: within normal limits Gait & Station: normal Patient leans: N/A  Psychiatric Specialty Exam  Presentation  General Appearance:  Appropriate for Environment; Casual; Fairly Groomed  Eye Contact: Good  Speech: Clear and Coherent; Normal Rate  Speech Volume: Normal  Handedness: Right  Mood and Affect  Mood: Euthymic  Duration of Depression Symptoms: Greater than two weeks  Affect: Appropriate; Congruent  Thought Process  Thought Processes: Coherent  Descriptions of Associations:Intact  Orientation:Full (Time, Place and Person)  Thought Content:Logical  History of Schizophrenia/Schizoaffective disorder:No  Duration of Psychotic Symptoms:No data recorded Hallucinations:Hallucinations: None Description of Auditory Hallucinations: NA Description of Visual Hallucinations: NA  Ideas of Reference:None  Suicidal Thoughts:Suicidal Thoughts: No SI Active Intent and/or Plan: Without Intent; Without Plan; Without Means to Carry Out; Without Access to Means SI Passive Intent and/or Plan: Without Intent; Without Plan; Without Means to Carry Out; Without Access to Means  Homicidal Thoughts:Homicidal Thoughts: No   Sensorium  Memory: Immediate Good; Recent Good; Remote Good  Judgment: Good  Insight: Good   Executive Functions  Concentration: Good  Attention Span: Good  Recall: Good  Fund of Knowledge: Good  Language: Good   Psychomotor Activity  Psychomotor Activity:Psychomotor Activity: Normal   Assets  Assets: Communication Skills; Desire for Improvement; Financial Resources/Insurance; Housing; Physical Health; Resilience;  Social Support   Sleep  Sleep:Sleep: Good Number of Hours of Sleep: 8   Physical Exam: See H&P. Blood pressure 106/75, pulse (!) 102, temperature (!) 97.4 F (36.3 C), temperature source Oral, resp. rate 20, height '6\' 2"'$  (1.88 m), weight 84.4 kg, SpO2 95 %. Body mass index is 23.88 kg/m.  Mental Status Per Nursing Assessment::   On Admission:  Suicidal ideation indicated by patient, Suicide plan  Demographic Factors:  Male, Adolescent or young adult, Low socioeconomic status, and Unemployed  Loss Factors: NA  Historical Factors: Impulsivity  Risk Reduction Factors:   Sense of responsibility to family, Positive social support, Positive therapeutic relationship, and Positive coping skills or problem solving skills  Continued Clinical Symptoms:  Depression:   Impulsivity More than one psychiatric diagnosis Previous Psychiatric Diagnoses and Treatments  Cognitive Features That Contribute To Risk:  Closed-mindedness, Polarized thinking, and Thought constriction (tunnel vision)    Suicide Risk:  Minimal: No identifiable suicidal ideation.  Patients presenting with no risk factors but with morbid ruminations; may be classified as minimal risk based on the severity of the depressive symptoms   [place]. Go on 01/09/2023.   Specialty: Behavioral Health Why: You have an appointment for medication management services on 01/09/23 at 1:20 pm. The appointment will be held in person. Contact information: [place] [address] [phone]                Plan Of Care/Follow-up recommendations:  See discharge recommendations above.  Lindell Spar, NP, pmhnp, fnp-bc. 01/04/2023, 11:13 AM

## 2023-01-04 NOTE — Discharge Summary (Signed)
Physician Discharge Summary Note  Patient:  Jesse Jones is an 42 y.o., male MRN:  [id number] DOB:  04-Mar-1981 Patient phone:  [phone] (home)  Patient address:   [place] [address],   Total Time spent with patient:  Greater than 30 minutes  Date of Admission:  12/28/2022  Date of Discharge: 01-04-23  Reason for Admission: Worsening suicidal ideations.  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   Depression    ED (erectile dysfunction) 05/12/2012   Hyperlipidemia, mild 04/18/2015   Insomnia    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   Outbursts of anger    Preventative health care 10/03/2011   Tobacco abuse 04/28/2011    Past Surgical History:  Procedure Laterality Date   NASAL SEPTUM SURGERY Bilateral    Dr Redmond Baseman 2017   punctured lung     toe surgeries  during childhood    for ingrown toenails   Family History:  Family History  Problem Relation Age of Onset   Depression Mother    Anxiety disorder Mother    Depression Brother    Diabetes Brother        type 1   Cancer Paternal Grandmother        lung/ didn't smoke   Cancer Maternal Uncle 50       colon cancer   Family Psychiatric  History: See H&P  Social History:  Social History   Substance and Sexual Activity  Alcohol Use Yes   Alcohol/week: 36.0 standard drinks of alcohol   Types: 36 Cans of beer per week   Comment: sober x38yr No alcohol.     Social History   Substance and Sexual Activity  Drug Use Yes   Types: Marijuana, Other-see comments    Comment: pt stopped 4 year ago    Social History   Socioeconomic History   Marital status: Single    Spouse name: Not on file   Number of children: 0   Years of education: 12   Highest education level: 12th grade  Occupational History   Not on file  Tobacco Use   Smoking status: Former    Packs/day: 0.30    Years: 20.00    Total pack years: 6.00    Types: Cigarettes    Passive exposure: Current   Smokeless tobacco: Never   Tobacco comments:    Discussed Smoking Cessation and Alcohol and Drug Services  Vaping Use   Vaping Use: Never used  Substance and Sexual Activity   Alcohol use: Yes    Alcohol/week: 36.0 standard drinks of alcohol    Types: 36 Cans of beer per week    Comment: sober x435yrNo alcohol.   Drug use: Yes    Types: Marijuana, Other-see comments    Comment: pt stopped 4 year ago   Sexual activity: Yes    Partners: Female    Comment: lives alone and eating well. exercising  Other Topics Concern   Not on file  Social History Narrative   Not on file   Social Determinants  of Health   Financial Resource Strain: Low Risk  (03/02/2022)   Overall Financial Resource Strain (CARDIA)    Difficulty of Paying Living Expenses: Not hard at all  Food Insecurity: No Food Insecurity (12/28/2022)   Hunger Vital Sign    Worried About Running Out of Food in the Last Year: Never true    Ran Out of Food in the Last Year: Never true  Transportation Needs: No Transportation Needs (12/28/2022)   PRAPARE - Hydrologist (Medical): No    Lack of Transportation (Non-Medical): No  Physical Activity: Inactive (03/02/2022)   Exercise Vital Sign    Days of Exercise per Week: 0 days    Minutes of Exercise per Session: 0 min  Stress: Stress Concern Present (03/02/2022)   [place]    Feeling of Stress : Very much  Social Connections: Socially Isolated (03/02/2022)   Social Connection and  Isolation Panel [NHANES]    Frequency of Communication with Friends and Family: More than three times a week    Frequency of Social Gatherings with Friends and Family: More than three times a week    Attends Religious Services: Never    Marine scientist or Organizations: No    Attends Music therapist: Never    Marital Status: Never married   Hospital Course: (Per admission evaluation notes):  42 year old Caucasian male with a long history of depression with suicidal ideations, history of ADHD/autism spectrum disorder and history of TBI who has been under regular care of a psychiatrist for many years.  He was admitted with the symptoms of depression and self-reported suicidal ideations with intent to overdose.  He was unable to contract for safety.   Prior to this discharge, Jesse Jones was seen & evaluated for mental health stability. The current laboratory findings were reviewed (stable), nurses notes & vital signs were reviewed as well. There are no current mental health or medical issues that should prevent this discharge at this time. Patient is being discharged to continue mental health care/medication management as noted below.   After the above admission evaluation, Jesse Jones's presenting symptoms were noted. He was recommended for mood stabilization treatments. The medication regimen targeting those presenting symptoms were discussed with him & initiated with his consent. He was medicated, stabilized & discharged on the medications as listed on his discharge medication lists below. Besides the mood stabilization treatments, he was was also enrolled & participated in the group counseling sessions being offered & held on this unit with encouragement from staff. He learned coping skills. He presented no other significant pre-existing medical issues that required treatments.  He tolerated his treatment regimen without any adverse effects or reactions reported. Jesse Jones's symptoms responded well  to his treatment regimen warranting this discharge. Patient is also mentally/medically stable & agreeable to this discharge.  During the course of this hospitalization, there were no instances of behavioral issues that required restraints or immediate intervention. Patient remained safe on the unit. There were no instances of self-harming behavior. There were no threats to other patients/staff. He remained cooperative to his daily routines & in taking his treatment regimen as recommended by his treatment team.  He participated in some group sessions and interacted with staff and other patients appropriately.   Over the course of this hospitalization, patient's symptoms responded well to his treatment regimen & his mood has improved. He was able to maintain his personal hygiene. On this day of  his hospital discharge, patient denies any thoughts of self-harm, suicidal/homicidal ideations. He is not observed to be responding to internal any internal stimuli. He has been compliant with his recommended treatment regimen. He is currently showing readiness for discharge & is future-oriented thinking. Patient encouraged to keep all psychiatric appointments. He left [facility] with all personal belongings in no apparent distress. Transportation per his family (father).    Physical Findings: AIMS: Facial and Oral Movements Muscles of Facial Expression: None, normal Lips and Perioral Area: None, normal Jaw: None, normal Tongue: None, normal,Extremity Movements Upper (arms, wrists, hands, fingers): None, normal Lower (legs, knees, ankles, toes): None, normal, Trunk Movements Neck, shoulders, hips: None, normal, Overall Severity Severity of abnormal movements (highest score from questions above): None, normal Incapacitation due to abnormal movements: None, normal Patient's awareness of abnormal movements (rate only patient's report): No Awareness, Dental Status Current problems with teeth and/or dentures?: No Does  patient usually wear dentures?: No  CIWA:    COWS:     Musculoskeletal: Strength & Muscle Tone: within normal limits Gait & Station: normal Patient leans: N/A   Psychiatric Specialty Exam:  Presentation  General Appearance:  Appropriate for Environment; Casual; Fairly Groomed  Eye Contact: Good  Speech: Clear and Coherent; Normal Rate  Speech Volume: Normal  Handedness: Right   Mood and Affect  Mood: Euthymic  Affect: Appropriate; Congruent   Thought Process  Thought Processes: Coherent; Goal Directed; Linear  Descriptions of Associations:Intact  Orientation:Full (Time, Place and Person)  Thought Content:Logical  History of Schizophrenia/Schizoaffective disorder:No  Duration of Psychotic Symptoms:No data recorded Hallucinations:Hallucinations: None Description of Auditory Hallucinations: NA Description of Visual Hallucinations: NA  Ideas of Reference:None  Suicidal Thoughts:Suicidal Thoughts: No SI Active Intent and/or Plan: Without Intent; Without Plan; Without Means to Carry Out; Without Access to Means SI Passive Intent and/or Plan: Without Intent; Without Plan; Without Means to Carry Out; Without Access to Means  Homicidal Thoughts:Homicidal Thoughts: No   Sensorium  Memory: Immediate Good; Recent Good; Remote Good  Judgment: Good  Insight: Good; Present  Executive Functions  Concentration: Good  Attention Span: Good  Recall: Good  Fund of Knowledge: Good  Language: Good  Psychomotor Activity  Psychomotor Activity:Psychomotor Activity: Normal  Assets  Assets: Communication Skills; Desire for Improvement; Financial Resources/Insurance; Housing; Physical Health; Resilience; Social Support  Sleep  Sleep:Sleep: Good Number of Hours of Sleep: 8  Physical Exam: Physical Exam Vitals and nursing note reviewed.  HENT:     Head: Normocephalic.     Mouth/Throat:     Pharynx: Oropharynx is clear.  Eyes:     Pupils:  Pupils are equal, round, and reactive to light.  Cardiovascular:     Rate and Rhythm: Normal rate.     Pulses: Normal pulses.  Pulmonary:     Effort: Pulmonary effort is normal.  Genitourinary:    Comments: Deferred Musculoskeletal:        General: Normal range of motion.     Cervical back: Normal range of motion.  Skin:    General: Skin is warm and dry.  Neurological:     General: No focal deficit present.     Mental Status: He is alert and oriented to person, place, and time. Mental status is at baseline.    Review of Systems  Constitutional:  Negative for chills, diaphoresis and fever.  HENT:  Negative for congestion and sore throat.   Eyes:  Negative for blurred vision.  Respiratory:  Negative for cough, shortness of breath and wheezing.  Cardiovascular:  Negative for chest pain and palpitations.  Gastrointestinal:  Negative for abdominal pain, constipation, diarrhea, heartburn, nausea and vomiting.  Genitourinary:  Negative for dysuria.  Musculoskeletal:  Negative for joint pain and myalgias.  Skin:  Negative for itching and rash.  Neurological:  Negative for dizziness, tingling, tremors, sensory change, speech change, focal weakness, seizures, loss of consciousness, weakness and headaches.  Endo/Heme/Allergies:        See allergy lists  Psychiatric/Behavioral:  Positive for depression (Hx of (stable on medication)). Negative for hallucinations, memory loss, substance abuse and suicidal ideas. The patient is not nervous/anxious (Stable upon discharge) and does not have insomnia.    Blood pressure 106/75, pulse (!) 102, temperature (!) 97.4 F (36.3 C), temperature source Oral, resp. rate 20, height '6\' 2"'$  (1.88 m), weight 84.4 kg, SpO2 95 %. Body mass index is 23.88 kg/m.   Social History   Tobacco Use  Smoking Status Former   Packs/day: 0.30   Years: 20.00   Total pack years: 6.00   Types: Cigarettes   Passive exposure: Current  Smokeless Tobacco Never  Tobacco  Comments   Discussed Smoking Cessation and Alcohol and Drug Services   Tobacco Cessation:  N/A, patient does not currently use tobacco products  Blood Alcohol level:  Lab Results  Component Value Date   ETH 14 (H) 12/26/2022   ETH <11 [id number]    Metabolic Disorder Labs:  Lab Results  Component Value Date   HGBA1C 4.6 (L) 12/09/2022   MPG 85.32 12/09/2022   MPG 97 06/23/2020   No results found for: "PROLACTIN" Lab Results  Component Value Date   CHOL 191 12/09/2022   TRIG 42 12/09/2022   HDL 60 12/09/2022   CHOLHDL 3.2 12/09/2022   VLDL 8 12/09/2022   LDLCALC 123 (H) 12/09/2022   LDLCALC 109 (H) 01/31/2022    See Psychiatric Specialty Exam and Suicide Risk Assessment completed by Attending Physician prior to discharge.  Discharge destination:  Home  Is patient on multiple antipsychotic therapies at discharge:  No   Has Patient had three or more failed trials of antipsychotic monotherapy by history:  No  Recommended Plan for Multiple Antipsychotic Therapies: NA   Allergies as of 01/04/2023       Reactions   Haloperidol Decanoate Other (See Comments)   Muscles tense up, makes neck lock up and caused his head to go to the side choking him, muscle spams   Hydroxyzine Anxiety, Other (See Comments)   EXCESSIVE ANXIOUSNESS   Penicillins Hives   Valium Other (See Comments)   "Made me really crazy"        Medication List     STOP taking these medications    fish oil-omega-3 fatty acids 1000 MG capsule   fluticasone 50 MCG/ACT nasal spray Commonly known as: FLONASE   multivitamin with minerals Tabs tablet       TAKE these medications      Indication  atomoxetine 25 MG capsule Commonly known as: STRATTERA Take 1 capsule (25 mg total) by mouth 2 (two) times daily with a meal. For ADHD What changed: additional instructions  Indication: Attention Deficit Hyperactivity Disorder   clonazePAM 0.5 MG tablet Commonly known as: KLONOPIN Take 1 tablet (0.5  mg total) by mouth 3 (three) times daily. For anxiety What changed: additional instructions  Indication: Feeling Anxious   escitalopram 20 MG tablet Commonly known as: LEXAPRO Take 1 tablet daily    gabapentin 300 MG capsule Commonly known as: NEURONTIN Take 1  capsule (300 mg total) by mouth 3 (three) times daily. For anxiety What changed:  when to take this additional instructions  Indication: Generalized Anxiety Disorder   rOPINIRole 0.25 MG tablet Commonly known as: REQUIP Take 1 tablet (0.25 mg total) by mouth at bedtime.  Indication: Restless Leg Syndrome   traZODone 50 MG tablet Commonly known as: DESYREL Take 1 tablet (50 mg total) by mouth at bedtime as needed for sleep. What changed:  when to take this reasons to take this  Indication: [place]. Go on 01/09/2023.   Specialty: Behavioral Health Why: You have an appointment for medication management services on 01/09/23 at 1:20 pm. The appointment will be held in person. Contact information: [place] [address] [phone]                Follow-up recommendations:  Activity:  As tolerated Diet: As recommended by your primary care doctor. Keep all scheduled follow-up appointments as recommended.  Comments: Comments: Patient is recommended to follow-up care on an outpatient basis as noted above. Prescriptions sent to pt's pharmacy of choice at discharge.   Patient agreeable to plan.   Given opportunity to ask questions.   Appears to feel comfortable with discharge denies any current suicidal or homicidal thought. Patient is also instructed prior to discharge to: Take all medications as prescribed by his/her mental healthcare provider. Report any adverse effects and or reactions from the medicines to his/her outpatient provider promptly. Patient has been instructed & cautioned: To not engage  in alcohol and or illegal drug use while on prescription medicines. In the event of worsening symptoms, patient is instructed to call the crisis hotline, 911 and or go to the nearest ED for appropriate evaluation and treatment of symptoms. To follow-up with his/her primary care provider for your other medical issues, concerns and or health care needs.  Signed: Lindell Spar, NP, pmhnp, fnp-bc 01/04/2023, 5:11 PM

## 2023-01-09 ENCOUNTER — Other Ambulatory Visit (HOSPITAL_BASED_OUTPATIENT_CLINIC_OR_DEPARTMENT_OTHER): Payer: Self-pay

## 2023-01-09 DIAGNOSIS — F902 Attention-deficit hyperactivity disorder, combined type: Secondary | ICD-10-CM | POA: Diagnosis not present

## 2023-01-09 DIAGNOSIS — F39 Unspecified mood [affective] disorder: Secondary | ICD-10-CM | POA: Diagnosis not present

## 2023-01-09 DIAGNOSIS — F411 Generalized anxiety disorder: Secondary | ICD-10-CM | POA: Diagnosis not present

## 2023-01-09 MED ORDER — REXULTI 0.5 MG PO TABS
0.5000 mg | ORAL_TABLET | Freq: Every day | ORAL | 0 refills | Status: AC
  Filled 2023-01-09 – 2023-02-25 (×2): qty 30, 30d supply, fill #0

## 2023-01-10 ENCOUNTER — Other Ambulatory Visit (HOSPITAL_BASED_OUTPATIENT_CLINIC_OR_DEPARTMENT_OTHER): Payer: Self-pay

## 2023-01-11 ENCOUNTER — Other Ambulatory Visit (HOSPITAL_BASED_OUTPATIENT_CLINIC_OR_DEPARTMENT_OTHER): Payer: Self-pay

## 2023-01-15 ENCOUNTER — Other Ambulatory Visit (HOSPITAL_BASED_OUTPATIENT_CLINIC_OR_DEPARTMENT_OTHER): Payer: Self-pay

## 2023-01-16 ENCOUNTER — Other Ambulatory Visit: Payer: Self-pay | Admitting: Family Medicine

## 2023-01-16 DIAGNOSIS — G2581 Restless legs syndrome: Secondary | ICD-10-CM

## 2023-01-21 ENCOUNTER — Other Ambulatory Visit (HOSPITAL_BASED_OUTPATIENT_CLINIC_OR_DEPARTMENT_OTHER): Payer: Self-pay

## 2023-01-23 DIAGNOSIS — F39 Unspecified mood [affective] disorder: Secondary | ICD-10-CM | POA: Diagnosis not present

## 2023-01-23 DIAGNOSIS — F902 Attention-deficit hyperactivity disorder, combined type: Secondary | ICD-10-CM | POA: Diagnosis not present

## 2023-01-23 DIAGNOSIS — F411 Generalized anxiety disorder: Secondary | ICD-10-CM | POA: Diagnosis not present

## 2023-01-30 ENCOUNTER — Other Ambulatory Visit (HOSPITAL_BASED_OUTPATIENT_CLINIC_OR_DEPARTMENT_OTHER): Payer: Self-pay

## 2023-01-31 ENCOUNTER — Other Ambulatory Visit (HOSPITAL_BASED_OUTPATIENT_CLINIC_OR_DEPARTMENT_OTHER): Payer: Self-pay

## 2023-02-01 ENCOUNTER — Other Ambulatory Visit (HOSPITAL_BASED_OUTPATIENT_CLINIC_OR_DEPARTMENT_OTHER): Payer: Self-pay

## 2023-02-04 ENCOUNTER — Other Ambulatory Visit (HOSPITAL_BASED_OUTPATIENT_CLINIC_OR_DEPARTMENT_OTHER): Payer: Self-pay

## 2023-02-05 ENCOUNTER — Other Ambulatory Visit (HOSPITAL_BASED_OUTPATIENT_CLINIC_OR_DEPARTMENT_OTHER): Payer: Self-pay

## 2023-02-06 ENCOUNTER — Telehealth: Payer: Self-pay | Admitting: Family Medicine

## 2023-02-06 NOTE — Telephone Encounter (Signed)
Copied from CRM [id number]. Topic: Medicare AWV >> Feb 06, 2023  9:49 AM Payton Doughty wrote: Reason for CRM: Called patient to schedule Medicare Annual Wellness Visit (AWV). Left message for patient to call back and schedule Medicare Annual Wellness Visit (AWV).  Last date of AWV: 10/02/2017  Please schedule an appointment at any time with Donne Anon, CMA  .  If any questions, please contact me.  Thank you ,  Verlee Rossetti; Care Guide Ambulatory Clinical Support Point [facility] Direct Dial: [phone]

## 2023-02-20 DIAGNOSIS — F902 Attention-deficit hyperactivity disorder, combined type: Secondary | ICD-10-CM | POA: Diagnosis not present

## 2023-02-20 DIAGNOSIS — F39 Unspecified mood [affective] disorder: Secondary | ICD-10-CM | POA: Diagnosis not present

## 2023-02-20 DIAGNOSIS — F411 Generalized anxiety disorder: Secondary | ICD-10-CM | POA: Diagnosis not present

## 2023-02-25 ENCOUNTER — Other Ambulatory Visit (HOSPITAL_BASED_OUTPATIENT_CLINIC_OR_DEPARTMENT_OTHER): Payer: Self-pay

## 2023-03-17 ENCOUNTER — Other Ambulatory Visit: Payer: Self-pay | Admitting: Family Medicine

## 2023-03-20 DIAGNOSIS — F39 Unspecified mood [affective] disorder: Secondary | ICD-10-CM | POA: Diagnosis not present

## 2023-03-20 DIAGNOSIS — F902 Attention-deficit hyperactivity disorder, combined type: Secondary | ICD-10-CM | POA: Diagnosis not present

## 2023-03-20 DIAGNOSIS — F411 Generalized anxiety disorder: Secondary | ICD-10-CM | POA: Diagnosis not present

## 2023-04-12 ENCOUNTER — Telehealth: Payer: Self-pay | Admitting: Family Medicine

## 2023-04-12 DIAGNOSIS — G2581 Restless legs syndrome: Secondary | ICD-10-CM

## 2023-04-12 MED ORDER — ROPINIROLE HCL 0.25 MG PO TABS: 0.2500 mg | ORAL_TABLET | Freq: Every day | ORAL | 0 refills | Status: DC

## 2023-04-12 NOTE — Telephone Encounter (Signed)
30 day supply sent. He needs an appt w/ Dr. Abner Greenspan for further refills.

## 2023-04-12 NOTE — Telephone Encounter (Signed)
Patient called and would like a med refill on ROPINIROLE 0.25 mg 90 day supply sent to [facility] in [place].

## 2023-04-18 ENCOUNTER — Other Ambulatory Visit: Payer: Self-pay | Admitting: Family Medicine

## 2023-04-18 DIAGNOSIS — G2581 Restless legs syndrome: Secondary | ICD-10-CM

## 2023-04-20 ENCOUNTER — Other Ambulatory Visit: Payer: Self-pay | Admitting: Family Medicine

## 2023-04-22 ENCOUNTER — Encounter: Payer: Self-pay | Admitting: *Deleted

## 2023-05-06 ENCOUNTER — Other Ambulatory Visit: Payer: Self-pay | Admitting: Family Medicine

## 2023-05-06 DIAGNOSIS — G2581 Restless legs syndrome: Secondary | ICD-10-CM

## 2023-05-07 ENCOUNTER — Encounter: Payer: Self-pay | Admitting: *Deleted

## 2023-05-20 DIAGNOSIS — F411 Generalized anxiety disorder: Secondary | ICD-10-CM | POA: Diagnosis not present

## 2023-05-20 DIAGNOSIS — F39 Unspecified mood [affective] disorder: Secondary | ICD-10-CM | POA: Diagnosis not present

## 2023-05-20 DIAGNOSIS — F902 Attention-deficit hyperactivity disorder, combined type: Secondary | ICD-10-CM | POA: Diagnosis not present

## 2023-06-28 DIAGNOSIS — H52203 Unspecified astigmatism, bilateral: Secondary | ICD-10-CM | POA: Diagnosis not present

## 2023-06-28 DIAGNOSIS — H5213 Myopia, bilateral: Secondary | ICD-10-CM | POA: Diagnosis not present

## 2023-09-03 DIAGNOSIS — F39 Unspecified mood [affective] disorder: Secondary | ICD-10-CM | POA: Diagnosis not present

## 2023-09-03 DIAGNOSIS — F902 Attention-deficit hyperactivity disorder, combined type: Secondary | ICD-10-CM | POA: Diagnosis not present

## 2023-09-03 DIAGNOSIS — F411 Generalized anxiety disorder: Secondary | ICD-10-CM | POA: Diagnosis not present

## 2023-11-25 ENCOUNTER — Other Ambulatory Visit: Payer: Self-pay | Admitting: Family Medicine

## 2023-11-25 DIAGNOSIS — G2581 Restless legs syndrome: Secondary | ICD-10-CM

## 2023-12-17 ENCOUNTER — Other Ambulatory Visit: Payer: Self-pay | Admitting: Family Medicine

## 2023-12-17 DIAGNOSIS — G2581 Restless legs syndrome: Secondary | ICD-10-CM

## 2024-03-20 ENCOUNTER — Other Ambulatory Visit: Payer: Self-pay | Admitting: Family Medicine

## 2024-03-20 DIAGNOSIS — G2581 Restless legs syndrome: Secondary | ICD-10-CM
